# Patient Record
Sex: Male | Born: 1945 | Race: White | Hispanic: No | Marital: Married | Smoking: Former smoker
Health system: Southern US, Community
[De-identification: ages and names within clinical notes are randomized; demographics above are authoritative.]

## PROBLEM LIST (undated history)

## (undated) DIAGNOSIS — C801 Malignant (primary) neoplasm, unspecified: Secondary | ICD-10-CM

## (undated) DIAGNOSIS — D539 Nutritional anemia, unspecified: Secondary | ICD-10-CM

## (undated) DIAGNOSIS — J9611 Chronic respiratory failure with hypoxia: Secondary | ICD-10-CM

## (undated) DIAGNOSIS — G7000 Myasthenia gravis without (acute) exacerbation: Secondary | ICD-10-CM

## (undated) DIAGNOSIS — E871 Hypo-osmolality and hyponatremia: Secondary | ICD-10-CM

## (undated) DIAGNOSIS — I700 Atherosclerosis of aorta: Secondary | ICD-10-CM

## (undated) DIAGNOSIS — B451 Cerebral cryptococcosis: Secondary | ICD-10-CM

## (undated) DIAGNOSIS — E43 Unspecified severe protein-calorie malnutrition: Secondary | ICD-10-CM

## (undated) DIAGNOSIS — M810 Age-related osteoporosis without current pathological fracture: Secondary | ICD-10-CM

## (undated) DIAGNOSIS — L89151 Pressure ulcer of sacral region, stage 1: Secondary | ICD-10-CM

## (undated) DIAGNOSIS — M1990 Unspecified osteoarthritis, unspecified site: Secondary | ICD-10-CM

## (undated) HISTORY — PX: COLONOSCOPY: SHX174

---

## 2007-04-29 ENCOUNTER — Ambulatory Visit: Payer: Self-pay | Admitting: Gastroenterology

## 2007-12-10 HISTORY — DX: Rider (driver) (passenger) of other motorcycle injured in unspecified traffic accident, initial encounter: V29.99XA

## 2019-12-10 DIAGNOSIS — C3490 Malignant neoplasm of unspecified part of unspecified bronchus or lung: Secondary | ICD-10-CM

## 2019-12-10 DIAGNOSIS — I82409 Acute embolism and thrombosis of unspecified deep veins of unspecified lower extremity: Secondary | ICD-10-CM

## 2019-12-10 DIAGNOSIS — I2699 Other pulmonary embolism without acute cor pulmonale: Secondary | ICD-10-CM

## 2019-12-10 HISTORY — DX: Acute embolism and thrombosis of unspecified deep veins of unspecified lower extremity: I82.409

## 2019-12-10 HISTORY — DX: Malignant neoplasm of unspecified part of unspecified bronchus or lung: C34.90

## 2019-12-10 HISTORY — DX: Other pulmonary embolism without acute cor pulmonale: I26.99

## 2019-12-20 ENCOUNTER — Other Ambulatory Visit: Payer: Self-pay

## 2019-12-20 ENCOUNTER — Emergency Department: Payer: Medicare HMO

## 2019-12-20 ENCOUNTER — Encounter: Payer: Self-pay | Admitting: *Deleted

## 2019-12-20 ENCOUNTER — Inpatient Hospital Stay
Admission: EM | Admit: 2019-12-20 | Discharge: 2019-12-21 | DRG: 177 | Disposition: A | Discharge status: Other Acute Inpatient Hospital | Payer: Medicare HMO | Attending: Internal Medicine | Admitting: Internal Medicine

## 2019-12-20 DIAGNOSIS — Z7982 Long term (current) use of aspirin: Secondary | ICD-10-CM | POA: Diagnosis not present

## 2019-12-20 DIAGNOSIS — Z7952 Long term (current) use of systemic steroids: Secondary | ICD-10-CM | POA: Diagnosis not present

## 2019-12-20 DIAGNOSIS — Z66 Do not resuscitate: Secondary | ICD-10-CM | POA: Diagnosis present

## 2019-12-20 DIAGNOSIS — J9601 Acute respiratory failure with hypoxia: Secondary | ICD-10-CM | POA: Diagnosis present

## 2019-12-20 DIAGNOSIS — R7303 Prediabetes: Secondary | ICD-10-CM | POA: Diagnosis present

## 2019-12-20 DIAGNOSIS — J1282 Pneumonia due to coronavirus disease 2019: Secondary | ICD-10-CM | POA: Diagnosis present

## 2019-12-20 DIAGNOSIS — R0602 Shortness of breath: Secondary | ICD-10-CM | POA: Diagnosis present

## 2019-12-20 DIAGNOSIS — G7000 Myasthenia gravis without (acute) exacerbation: Secondary | ICD-10-CM | POA: Diagnosis present

## 2019-12-20 DIAGNOSIS — Z7983 Long term (current) use of bisphosphonates: Secondary | ICD-10-CM

## 2019-12-20 DIAGNOSIS — Z87891 Personal history of nicotine dependence: Secondary | ICD-10-CM

## 2019-12-20 DIAGNOSIS — Z85828 Personal history of other malignant neoplasm of skin: Secondary | ICD-10-CM

## 2019-12-20 DIAGNOSIS — R0902 Hypoxemia: Secondary | ICD-10-CM

## 2019-12-20 DIAGNOSIS — E782 Mixed hyperlipidemia: Secondary | ICD-10-CM | POA: Diagnosis present

## 2019-12-20 DIAGNOSIS — J189 Pneumonia, unspecified organism: Secondary | ICD-10-CM | POA: Diagnosis present

## 2019-12-20 DIAGNOSIS — U071 COVID-19: Principal | ICD-10-CM | POA: Diagnosis present

## 2019-12-20 HISTORY — DX: Myasthenia gravis without (acute) exacerbation: G70.00

## 2019-12-20 HISTORY — DX: Malignant (primary) neoplasm, unspecified: C80.1

## 2019-12-20 LAB — GLUCOSE, CAPILLARY
Glucose-Capillary: 147 mg/dL — ABNORMAL HIGH (ref 70–99)
Glucose-Capillary: 167 mg/dL — ABNORMAL HIGH (ref 70–99)

## 2019-12-20 LAB — BLOOD GAS, VENOUS
Acid-Base Excess: 0.7 mmol/L (ref 0.0–2.0)
Bicarbonate: 24.3 mmol/L (ref 20.0–28.0)
FIO2: 0.21
O2 Saturation: 76.6 %
Patient temperature: 37
pCO2, Ven: 35 mmHg — ABNORMAL LOW (ref 44.0–60.0)
pH, Ven: 7.45 — ABNORMAL HIGH (ref 7.250–7.430)
pO2, Ven: 39 mmHg (ref 32.0–45.0)

## 2019-12-20 LAB — CBC
HCT: 36.5 % — ABNORMAL LOW (ref 39.0–52.0)
Hemoglobin: 12.2 g/dL — ABNORMAL LOW (ref 13.0–17.0)
MCH: 32.4 pg (ref 26.0–34.0)
MCHC: 33.4 g/dL (ref 30.0–36.0)
MCV: 97.1 fL (ref 80.0–100.0)
Platelets: 270 10*3/uL (ref 150–400)
RBC: 3.76 MIL/uL — ABNORMAL LOW (ref 4.22–5.81)
RDW: 14.2 % (ref 11.5–15.5)
WBC: 7.8 10*3/uL (ref 4.0–10.5)
nRBC: 0 % (ref 0.0–0.2)

## 2019-12-20 LAB — URINALYSIS, COMPLETE (UACMP) WITH MICROSCOPIC
Bacteria, UA: NONE SEEN
Bilirubin Urine: NEGATIVE
Glucose, UA: NEGATIVE mg/dL
Hgb urine dipstick: NEGATIVE
Ketones, ur: NEGATIVE mg/dL
Leukocytes,Ua: NEGATIVE
Nitrite: NEGATIVE
Protein, ur: NEGATIVE mg/dL
Specific Gravity, Urine: 1.006 (ref 1.005–1.030)
Squamous Epithelial / HPF: NONE SEEN (ref 0–5)
pH: 6 (ref 5.0–8.0)

## 2019-12-20 LAB — COMPREHENSIVE METABOLIC PANEL
ALT: 21 U/L (ref 0–44)
AST: 35 U/L (ref 15–41)
Albumin: 3 g/dL — ABNORMAL LOW (ref 3.5–5.0)
Alkaline Phosphatase: 81 U/L (ref 38–126)
Anion gap: 10 (ref 5–15)
BUN: 21 mg/dL (ref 8–23)
CO2: 23 mmol/L (ref 22–32)
Calcium: 8.9 mg/dL (ref 8.9–10.3)
Chloride: 102 mmol/L (ref 98–111)
Creatinine, Ser: 0.97 mg/dL (ref 0.61–1.24)
GFR calc Af Amer: >60 mL/min (ref >60)
GFR calc non Af Amer: >60 mL/min (ref >60)
Glucose, Bld: 107 mg/dL — ABNORMAL HIGH (ref 70–99)
Potassium: 4.2 mmol/L (ref 3.5–5.1)
Sodium: 135 mmol/L (ref 135–145)
Total Bilirubin: 0.7 mg/dL (ref 0.3–1.2)
Total Protein: 6.4 g/dL — ABNORMAL LOW (ref 6.5–8.1)

## 2019-12-20 LAB — ABO/RH: ABO/RH(D): O POS

## 2019-12-20 LAB — CBC WITH DIFFERENTIAL/PLATELET
Abs Immature Granulocytes: 0.09 10*3/uL — ABNORMAL HIGH (ref 0.00–0.07)
Basophils Absolute: 0 10*3/uL (ref 0.0–0.1)
Basophils Relative: 0 %
Eosinophils Absolute: 0 10*3/uL (ref 0.0–0.5)
Eosinophils Relative: 0 %
HCT: 36.3 % — ABNORMAL LOW (ref 39.0–52.0)
Hemoglobin: 12.4 g/dL — ABNORMAL LOW (ref 13.0–17.0)
Immature Granulocytes: 1 %
Lymphocytes Relative: 2 %
Lymphs Abs: 0.2 10*3/uL — ABNORMAL LOW (ref 0.7–4.0)
MCH: 32.8 pg (ref 26.0–34.0)
MCHC: 34.2 g/dL (ref 30.0–36.0)
MCV: 96 fL (ref 80.0–100.0)
Monocytes Absolute: 0.6 10*3/uL (ref 0.1–1.0)
Monocytes Relative: 6 %
Neutro Abs: 8.6 10*3/uL — ABNORMAL HIGH (ref 1.7–7.7)
Neutrophils Relative %: 91 %
Platelets: 271 10*3/uL (ref 150–400)
RBC: 3.78 MIL/uL — ABNORMAL LOW (ref 4.22–5.81)
RDW: 14 % (ref 11.5–15.5)
WBC: 9.4 10*3/uL (ref 4.0–10.5)
nRBC: 0 % (ref 0.0–0.2)

## 2019-12-20 LAB — LACTIC ACID, PLASMA: Lactic Acid, Venous: 1.3 mmol/L (ref 0.5–1.9)

## 2019-12-20 LAB — PROCALCITONIN: Procalcitonin: 0.58 ng/mL

## 2019-12-20 LAB — TROPONIN I (HIGH SENSITIVITY): Troponin I (High Sensitivity): 12 ng/L (ref <18)

## 2019-12-20 MED ORDER — SODIUM CHLORIDE 0.9 % IV SOLN
100.0000 mg | Freq: Every day | INTRAVENOUS | Status: DC
  Administered 2019-12-21: 10:00:00 100 mg via INTRAVENOUS
  Filled 2019-12-20 (×2): qty 20

## 2019-12-20 MED ORDER — ENOXAPARIN SODIUM 40 MG/0.4ML ~~LOC~~ SOLN
40.0000 mg | SUBCUTANEOUS | Status: DC
  Administered 2019-12-20: 22:00:00 40 mg via SUBCUTANEOUS
  Filled 2019-12-20: qty 0.4

## 2019-12-20 MED ORDER — DEXAMETHASONE SODIUM PHOSPHATE 10 MG/ML IJ SOLN
10.0000 mg | Freq: Once | INTRAMUSCULAR | Status: AC
  Administered 2019-12-20: 13:00:00 10 mg via INTRAVENOUS
  Filled 2019-12-20: qty 1

## 2019-12-20 MED ORDER — ASCORBIC ACID 500 MG PO TABS
500.0000 mg | ORAL_TABLET | Freq: Every day | ORAL | Status: DC
  Administered 2019-12-20: 19:00:00 500 mg via ORAL
  Filled 2019-12-20 (×2): qty 1

## 2019-12-20 MED ORDER — FOLIC ACID 1 MG PO TABS
1.0000 mg | ORAL_TABLET | Freq: Every day | ORAL | Status: DC
  Administered 2019-12-20: 19:00:00 1 mg via ORAL
  Filled 2019-12-20 (×2): qty 1

## 2019-12-20 MED ORDER — CALCIUM CARBONATE-VITAMIN D 500-200 MG-UNIT PO TABS
1.0000 | ORAL_TABLET | Freq: Three times a day (TID) | ORAL | Status: DC
  Administered 2019-12-20: 1 via ORAL
  Filled 2019-12-20 (×4): qty 1

## 2019-12-20 MED ORDER — SODIUM CHLORIDE 0.9 % IV SOLN: 200.0000 mg | Freq: Once | INTRAVENOUS | Status: DC

## 2019-12-20 MED ORDER — CHELATED ZINC 50 MG PO TABS: 50.0000 mg | ORAL_TABLET | Freq: Every day | ORAL | Status: DC

## 2019-12-20 MED ORDER — MYCOPHENOLATE MOFETIL 250 MG PO CAPS
750.0000 mg | ORAL_CAPSULE | Freq: Two times a day (BID) | ORAL | Status: DC
  Administered 2019-12-21: 12:00:00 750 mg via ORAL
  Filled 2019-12-20 (×3): qty 3

## 2019-12-20 MED ORDER — SODIUM CHLORIDE 0.9% FLUSH: 3.0000 mL | INTRAVENOUS | Status: DC | PRN

## 2019-12-20 MED ORDER — SODIUM CHLORIDE 0.9 % IV SOLN: 250.0000 mL | INTRAVENOUS | Status: DC | PRN

## 2019-12-20 MED ORDER — THIAMINE HCL 100 MG PO TABS
100.0000 mg | ORAL_TABLET | Freq: Every day | ORAL | Status: DC
  Administered 2019-12-20: 19:00:00 100 mg via ORAL
  Filled 2019-12-20 (×2): qty 1

## 2019-12-20 MED ORDER — ONDANSETRON HCL 4 MG PO TABS: 4.0000 mg | ORAL_TABLET | Freq: Four times a day (QID) | ORAL | Status: DC | PRN

## 2019-12-20 MED ORDER — ADULT MULTIVITAMIN W/MINERALS CH
1.0000 | ORAL_TABLET | ORAL | Status: DC
  Administered 2019-12-20: 1 via ORAL
  Filled 2019-12-20 (×2): qty 1

## 2019-12-20 MED ORDER — B COMPLEX-C PO TABS
1.0000 | ORAL_TABLET | Freq: Every day | ORAL | Status: DC
  Filled 2019-12-20: qty 1

## 2019-12-20 MED ORDER — ONDANSETRON HCL 4 MG/2ML IJ SOLN: 4.0000 mg | Freq: Four times a day (QID) | INTRAMUSCULAR | Status: DC | PRN

## 2019-12-20 MED ORDER — DEXAMETHASONE SODIUM PHOSPHATE 10 MG/ML IJ SOLN
6.0000 mg | INTRAMUSCULAR | Status: DC
  Administered 2019-12-20: 18:00:00 6 mg via INTRAVENOUS
  Filled 2019-12-20: qty 1

## 2019-12-20 MED ORDER — SODIUM CHLORIDE 0.9 % IV SOLN
200.0000 mg | Freq: Once | INTRAVENOUS | Status: AC
  Administered 2019-12-20: 16:00:00 200 mg via INTRAVENOUS
  Filled 2019-12-20: qty 200

## 2019-12-20 MED ORDER — INSULIN ASPART 100 UNIT/ML ~~LOC~~ SOLN
0.0000 [IU] | SUBCUTANEOUS | Status: DC
  Administered 2019-12-20 – 2019-12-21 (×2): 3 [IU] via SUBCUTANEOUS
  Administered 2019-12-21: 04:00:00 2 [IU] via SUBCUTANEOUS
  Filled 2019-12-20 (×3): qty 1

## 2019-12-20 MED ORDER — ALBUTEROL SULFATE HFA 108 (90 BASE) MCG/ACT IN AERS
2.0000 | INHALATION_SPRAY | Freq: Four times a day (QID) | RESPIRATORY_TRACT | Status: DC
  Administered 2019-12-20 – 2019-12-21 (×4): 2 via RESPIRATORY_TRACT
  Filled 2019-12-20: qty 6.7

## 2019-12-20 MED ORDER — GUAIFENESIN-DM 100-10 MG/5ML PO SYRP
10.0000 mL | ORAL_SOLUTION | ORAL | Status: DC | PRN
  Filled 2019-12-20: qty 10

## 2019-12-20 MED ORDER — ASPIRIN EC 81 MG PO TBEC
81.0000 mg | DELAYED_RELEASE_TABLET | Freq: Every day | ORAL | Status: DC
  Administered 2019-12-20: 81 mg via ORAL
  Filled 2019-12-20 (×2): qty 1

## 2019-12-20 MED ORDER — ZINC SULFATE 220 (50 ZN) MG PO CAPS
220.0000 mg | ORAL_CAPSULE | Freq: Every day | ORAL | Status: DC
  Administered 2019-12-20: 19:00:00 220 mg via ORAL
  Filled 2019-12-20 (×2): qty 1

## 2019-12-20 MED ORDER — HYDROCOD POLST-CPM POLST ER 10-8 MG/5ML PO SUER
5.0000 mL | Freq: Two times a day (BID) | ORAL | Status: DC | PRN
  Administered 2019-12-21: 5 mL via ORAL
  Filled 2019-12-20 (×2): qty 5

## 2019-12-20 MED ORDER — SODIUM CHLORIDE 0.9% FLUSH
3.0000 mL | Freq: Two times a day (BID) | INTRAVENOUS | Status: DC
  Administered 2019-12-21: 03:00:00 3 mL via INTRAVENOUS

## 2019-12-20 MED ORDER — SODIUM CHLORIDE 0.9 % IV SOLN: 100.0000 mg | Freq: Every day | INTRAVENOUS | Status: DC

## 2019-12-20 NOTE — ED Provider Notes (Signed)
[facility] Emergency Department Provider Note       Time seen: ----------------------------------------- 12:05 PM on 12/20/2019 ----------------------------------------- I have reviewed the triage vital signs and the nursing notes. HISTORY   Chief Complaint Shortness of Breath   HPI Duane Santos is a 74 y.o. male with a history of myasthenia gravis, cancer who presents to the ED for Covid positivity and difficulty breathing.  Patient tested positive on January 4.  Patient's wife reports she has had persistent fever and was given ibuprofen for EMS arrived.  Patient arrived on 2 L nasal cannula oxygen was 84% to increase to 4 L resulting in 93%.  He has remained tachypneic.  No past medical history on file.  There are no problems to display for this patient.  Allergies Patient has no allergy information on record.  Social History Social History   Tobacco Use  . Smoking status: Not on file  Substance Use Topics  . Alcohol use: Not on file  . Drug use: Not on file    Review of Systems Constitutional: Positive for fever Cardiovascular: Negative for chest pain. Respiratory: Positive for shortness of breath Gastrointestinal: Negative for abdominal pain, vomiting and diarrhea. Musculoskeletal: Negative for back pain. Skin: Negative for rash. Neurological: Positive for weakness  All systems negative/normal/unremarkable except as stated in the HPI  ____________________________________________   PHYSICAL EXAM:  VITAL SIGNS: ED Triage Vitals  Enc Vitals Group     BP      Pulse      Resp      Temp      Temp src      SpO2      Weight      Height      Head Circumference      Peak Flow      Pain Score      Pain Loc      Pain Edu?      Excl. in [place]?     Constitutional: Alert and oriented.  Mild distress Eyes: Conjunctivae are normal. Normal extraocular movements. ENT      Head: Normocephalic and atraumatic.      Nose: No  congestion/rhinnorhea.      Mouth/Throat: Mucous membranes are moist.      Neck: No stridor. Cardiovascular: Normal rate, regular rhythm. No murmurs, rubs, or gallops. Respiratory: Tachypnea with clear breath sounds Gastrointestinal: Soft and nontender. Normal bowel sounds Musculoskeletal: Nontender with normal range of motion in extremities. No lower extremity tenderness nor edema. Neurologic:  Normal speech and language. No gross focal neurologic deficits are appreciated.  Skin:  Skin is warm, dry and intact. No rash noted. Psychiatric: Mood and affect are normal. Speech and behavior are normal.  ____________________________________________  EKG: Interpreted by me.  Sinus rhythm the rate of 93 bpm, normal PR interval, normal QRS, normal QT  ____________________________________________  ED COURSE:  As part of my medical decision making, I reviewed the following data within the [place] History obtained from family if available, nursing notes, old chart and ekg, as well as notes from prior ED visits. Patient presented for dyspnea and hypoxia, we will assess with labs and imaging as indicated at this time.   Procedures  ROWE WARMAN was evaluated in Emergency Department on 12/20/2019 for the symptoms described in the history of present illness. He was evaluated in the context of the global COVID-19 pandemic, which necessitated consideration that the patient might be at risk for infection with the SARS-CoV-2 virus  that causes COVID-19. Institutional protocols and algorithms that pertain to the evaluation of patients at risk for COVID-19 are in a state of rapid change based on information released by regulatory bodies including the CDC and federal and state organizations. These policies and algorithms were followed during the patient's care in the ED.  ____________________________________________   LABS (pertinent positives/negatives)  Labs Reviewed  CULTURE, BLOOD  (ROUTINE X 2)  CULTURE, BLOOD (ROUTINE X 2)  URINE CULTURE  LACTIC ACID, PLASMA  LACTIC ACID, PLASMA  COMPREHENSIVE METABOLIC PANEL  CBC WITH DIFFERENTIAL/PLATELET  BLOOD GAS, VENOUS  URINALYSIS, COMPLETE (UACMP) WITH MICROSCOPIC   CRITICAL CARE Performed by: Laurence Aly   Total critical care time: 30 minutes  Critical care time was exclusive of separately billable procedures and treating other patients.  Critical care was necessary to treat or prevent imminent or life-threatening deterioration.  Critical care was time spent personally by me on the following activities: development of treatment plan with patient and/or surrogate as well as nursing, discussions with consultants, evaluation of patient's response to treatment, examination of patient, obtaining history from patient or surrogate, ordering and performing treatments and interventions, ordering and review of laboratory studies, ordering and review of radiographic studies, pulse oximetry and re-evaluation of patient's condition.  RADIOLOGY Images were viewed by me CXR IMPRESSION: Ill-defined airspace opacity bilaterally, more on the right than on the left, without consolidation. Suspect atypical organism multifocal pneumonia.  Cardiac silhouette normal.  No adenopathy.  ____________________________________________   DIFFERENTIAL DIAGNOSIS   COVID-19, pneumonia, PE, pneumothorax  FINAL ASSESSMENT AND PLAN  COVID-19, hypoxia   Plan: The patient had presented for COVID-19 and dyspnea. Patient's labs look as expected. Patient's imaging revealed bilateral airspace disease consistent with COVID-19.  Patient was given 4 L nasal cannula oxygen as dictated above with IV Decadron.  Given his hypoxia he will require hospital admission.   Laurence Aly, MD    Note: This note was generated in part or whole with voice recognition software. Voice recognition is usually quite accurate but there are  transcription errors that can and very often do occur. I apologize for any typographical errors that were not detected and corrected.     Earleen Newport, MD 12/20/19 1255

## 2019-12-20 NOTE — ED Notes (Signed)
Report given to Wells Guiles RN/Kate B RN

## 2019-12-20 NOTE — ED Notes (Signed)
Lab has 2 sets of blood cultures from earlier today

## 2019-12-20 NOTE — Progress Notes (Signed)
Remdesivir - Pharmacy Brief Note   O:  ALT: 21 CXR: Ill-defined airspace opacity bilaterally, more on the right than on the left, without consolidation. Suspect atypical organism multifocal pneumonia SpO2: 89-93% on 4L   A/P:  Remdesivir 200 mg IVPB once followed by 100 mg IVPB daily x 4 days.   Dorena Bodo, PharmD 12/20/2019 2:57 PM

## 2019-12-20 NOTE — ED Notes (Signed)
Pillow placed under knees for comfort, encouraged slow deep breaths instead of taking shallow, small breaths. Pt states he will work on this. Alert and oriented, specimens collected per order. Pt tolerated well. Call light in reach, bed locked and low. Pt updated his daughter on his condition via hospital phone.

## 2019-12-20 NOTE — ED Notes (Signed)
Report to include Situation, Background, Assessment, and Recommendations received from [facility]. Patient alert and oriented and in no acute distress at this time.  Medications discussed and patient agreed to send home medication to pharmacy. Pt assisted to toilet for bowel movement. Patient repositioned, bed locked in lowest position, urinal available, call light within reach, lights dimmed for pt comfort.

## 2019-12-20 NOTE — ED Notes (Signed)
Pt called out to use bathroom. Pt stood next to bed to use urinal. Pt finished and O2 stat began to drop to 84. When pt was laying back down it took a few min. For o2 to get back to 95. Tech also turned oxygen up to 95%. Pt said it helps when he takes his mask off when we leave the room. Pt breathing seemed to calm itself after a few min on 95% and slow breathing.   LW edt

## 2019-12-20 NOTE — ED Notes (Signed)
Meds given to patient, explained to patient why his blood sugar is being checked with him on steroids, and why it's important to keep his blood sugar WNL with insulin. Patient now agreeable with tx. Provided with ice water and TV remote. Applied DNR bracelet

## 2019-12-20 NOTE — ED Notes (Signed)
Called respiratory regarding sats in 80s, requested high flow

## 2019-12-20 NOTE — ED Triage Notes (Signed)
Per EMS report, patient tested positive for Covid on 12/13/2019. Patient's wife reports patient has had a persistent fever of 103-104 orally and was given ibuprofen before EMS arrived. Patient arrived on 2L O2 via [place] and was 84%. O2 was increased to 4L and patient is now 93%. Patient is tachypneic at 26 and has increased dyspnea with exertion.

## 2019-12-20 NOTE — ED Notes (Signed)
Pharmacy has been messaged regarding verifying medications
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
## 2019-12-21 ENCOUNTER — Other Ambulatory Visit: Payer: Self-pay

## 2019-12-21 ENCOUNTER — Inpatient Hospital Stay (HOSPITAL_COMMUNITY)
Admission: AD | Admit: 2019-12-21 | Discharge: 2020-01-03 | DRG: 177 | Disposition: A | Discharge status: Home Health | Payer: Medicare HMO | Source: Other Acute Inpatient Hospital | Attending: Internal Medicine | Admitting: Internal Medicine

## 2019-12-21 ENCOUNTER — Encounter (HOSPITAL_COMMUNITY): Payer: Self-pay | Admitting: Internal Medicine

## 2019-12-21 DIAGNOSIS — B001 Herpesviral vesicular dermatitis: Secondary | ICD-10-CM | POA: Diagnosis present

## 2019-12-21 DIAGNOSIS — U071 COVID-19: Secondary | ICD-10-CM

## 2019-12-21 DIAGNOSIS — Z79899 Other long term (current) drug therapy: Secondary | ICD-10-CM | POA: Diagnosis not present

## 2019-12-21 DIAGNOSIS — D649 Anemia, unspecified: Secondary | ICD-10-CM | POA: Diagnosis present

## 2019-12-21 DIAGNOSIS — Z66 Do not resuscitate: Secondary | ICD-10-CM | POA: Diagnosis present

## 2019-12-21 DIAGNOSIS — J1282 Pneumonia due to coronavirus disease 2019: Secondary | ICD-10-CM | POA: Diagnosis present

## 2019-12-21 DIAGNOSIS — G7000 Myasthenia gravis without (acute) exacerbation: Secondary | ICD-10-CM | POA: Diagnosis present

## 2019-12-21 DIAGNOSIS — J9601 Acute respiratory failure with hypoxia: Secondary | ICD-10-CM | POA: Diagnosis present

## 2019-12-21 DIAGNOSIS — E782 Mixed hyperlipidemia: Secondary | ICD-10-CM | POA: Diagnosis present

## 2019-12-21 DIAGNOSIS — R040 Epistaxis: Secondary | ICD-10-CM | POA: Diagnosis not present

## 2019-12-21 DIAGNOSIS — I82442 Acute embolism and thrombosis of left tibial vein: Secondary | ICD-10-CM | POA: Diagnosis present

## 2019-12-21 DIAGNOSIS — E872 Acidosis: Secondary | ICD-10-CM | POA: Diagnosis present

## 2019-12-21 DIAGNOSIS — Z7952 Long term (current) use of systemic steroids: Secondary | ICD-10-CM

## 2019-12-21 DIAGNOSIS — R0902 Hypoxemia: Secondary | ICD-10-CM

## 2019-12-21 DIAGNOSIS — R791 Abnormal coagulation profile: Secondary | ICD-10-CM | POA: Diagnosis not present

## 2019-12-21 DIAGNOSIS — I2699 Other pulmonary embolism without acute cor pulmonale: Secondary | ICD-10-CM | POA: Diagnosis present

## 2019-12-21 DIAGNOSIS — Z7982 Long term (current) use of aspirin: Secondary | ICD-10-CM | POA: Diagnosis not present

## 2019-12-21 DIAGNOSIS — M810 Age-related osteoporosis without current pathological fracture: Secondary | ICD-10-CM | POA: Diagnosis present

## 2019-12-21 HISTORY — DX: COVID-19: U07.1

## 2019-12-21 LAB — GLUCOSE, CAPILLARY
Glucose-Capillary: 121 mg/dL — ABNORMAL HIGH (ref 70–99)
Glucose-Capillary: 145 mg/dL — ABNORMAL HIGH (ref 70–99)
Glucose-Capillary: 153 mg/dL — ABNORMAL HIGH (ref 70–99)

## 2019-12-21 LAB — URINE CULTURE: Culture: NO GROWTH

## 2019-12-21 LAB — C-REACTIVE PROTEIN: CRP: 18.5 mg/dL — ABNORMAL HIGH (ref <1.0)

## 2019-12-21 LAB — FIBRIN DERIVATIVES D-DIMER (ARMC ONLY): Fibrin derivatives D-dimer (ARMC): 3491 ng/mL (FEU) — ABNORMAL HIGH (ref 0.00–499.00)

## 2019-12-21 MED ORDER — FOLIC ACID 1 MG PO TABS: 1.0000 mg | ORAL_TABLET | Freq: Every day | ORAL | Status: DC

## 2019-12-21 MED ORDER — ASCORBIC ACID 500 MG PO TABS: 500.0000 mg | ORAL_TABLET | Freq: Every day | ORAL | Status: DC

## 2019-12-21 MED ORDER — ONDANSETRON HCL 4 MG/2ML IJ SOLN: 4.0000 mg | Freq: Four times a day (QID) | INTRAMUSCULAR | Status: DC | PRN

## 2019-12-21 MED ORDER — ADULT MULTIVITAMIN W/MINERALS CH
1.0000 | ORAL_TABLET | ORAL | Status: DC
  Administered 2019-12-24 – 2020-01-03 (×6): 1 via ORAL
  Filled 2019-12-21 (×9): qty 1

## 2019-12-21 MED ORDER — ASCORBIC ACID 500 MG PO TABS
500.0000 mg | ORAL_TABLET | Freq: Every day | ORAL | Status: DC
  Administered 2019-12-24 – 2020-01-03 (×11): 500 mg via ORAL
  Filled 2019-12-21 (×13): qty 1

## 2019-12-21 MED ORDER — ONDANSETRON HCL 4 MG PO TABS: 4.0000 mg | ORAL_TABLET | Freq: Four times a day (QID) | ORAL | Status: DC | PRN

## 2019-12-21 MED ORDER — HYDROCOD POLST-CPM POLST ER 10-8 MG/5ML PO SUER
5.0000 mL | Freq: Two times a day (BID) | ORAL | Status: DC | PRN
  Administered 2020-01-01 – 2020-01-02 (×2): 5 mL via ORAL
  Filled 2019-12-21 (×2): qty 5

## 2019-12-21 MED ORDER — CALCIUM CARBONATE-VITAMIN D 500-200 MG-UNIT PO TABS: 1.0000 | ORAL_TABLET | Freq: Three times a day (TID) | ORAL | Status: DC

## 2019-12-21 MED ORDER — SODIUM CHLORIDE 0.9 % IV SOLN
500.0000 mg | INTRAVENOUS | Status: DC
  Administered 2019-12-21 – 2019-12-23 (×3): 500 mg via INTRAVENOUS
  Filled 2019-12-21 (×3): qty 500

## 2019-12-21 MED ORDER — GUAIFENESIN-DM 100-10 MG/5ML PO SYRP: 10.0000 mL | ORAL_SOLUTION | ORAL | 0 refills | Status: DC | PRN

## 2019-12-21 MED ORDER — THIAMINE HCL 100 MG PO TABS
100.0000 mg | ORAL_TABLET | Freq: Every day | ORAL | Status: DC
  Administered 2019-12-24 – 2020-01-03 (×11): 100 mg via ORAL
  Filled 2019-12-21 (×13): qty 1

## 2019-12-21 MED ORDER — TESTOSTERONE 50 MG/5GM (1%) TD GEL
5.0000 g | TRANSDERMAL | Status: DC
  Filled 2019-12-21: qty 5

## 2019-12-21 MED ORDER — FOLIC ACID 1 MG PO TABS
1.0000 mg | ORAL_TABLET | Freq: Every day | ORAL | Status: DC
  Administered 2019-12-24 – 2020-01-03 (×11): 1 mg via ORAL
  Filled 2019-12-21 (×12): qty 1

## 2019-12-21 MED ORDER — SODIUM CHLORIDE 0.9 % IV SOLN
1.0000 g | INTRAVENOUS | Status: AC
  Administered 2019-12-21 – 2019-12-25 (×5): 1 g via INTRAVENOUS
  Filled 2019-12-21 (×6): qty 10

## 2019-12-21 MED ORDER — ONDANSETRON HCL 4 MG PO TABS: 4.0000 mg | ORAL_TABLET | Freq: Four times a day (QID) | ORAL | 0 refills | Status: DC | PRN

## 2019-12-21 MED ORDER — HYDROCOD POLST-CPM POLST ER 10-8 MG/5ML PO SUER: 5.0000 mL | Freq: Two times a day (BID) | ORAL | Status: DC | PRN

## 2019-12-21 MED ORDER — ACETAMINOPHEN 325 MG PO TABS: 650.0000 mg | ORAL_TABLET | Freq: Four times a day (QID) | ORAL | Status: DC | PRN

## 2019-12-21 MED ORDER — MELATONIN 3 MG PO TABS
3.0000 mg | ORAL_TABLET | Freq: Every evening | ORAL | Status: DC | PRN
  Administered 2019-12-25 – 2020-01-02 (×4): 3 mg via ORAL
  Filled 2019-12-21 (×6): qty 1

## 2019-12-21 MED ORDER — ACETAMINOPHEN 325 MG PO TABS
650.0000 mg | ORAL_TABLET | Freq: Four times a day (QID) | ORAL | Status: DC | PRN
  Administered 2019-12-23: 22:00:00 650 mg via ORAL
  Filled 2019-12-21: qty 2

## 2019-12-21 MED ORDER — PHENOL 1.4 % MT LIQD
1.0000 | OROMUCOSAL | Status: DC | PRN
  Administered 2019-12-21 – 2019-12-24 (×2): 1 via OROMUCOSAL
  Filled 2019-12-21: qty 177

## 2019-12-21 MED ORDER — DOCUSATE SODIUM 100 MG PO CAPS
100.0000 mg | ORAL_CAPSULE | Freq: Two times a day (BID) | ORAL | Status: DC
  Administered 2019-12-22 – 2019-12-28 (×5): 100 mg via ORAL
  Filled 2019-12-21 (×16): qty 1

## 2019-12-21 MED ORDER — MYCOPHENOLATE MOFETIL 250 MG PO CAPS: 750.0000 mg | ORAL_CAPSULE | Freq: Two times a day (BID) | ORAL | Status: DC

## 2019-12-21 MED ORDER — DEXAMETHASONE SODIUM PHOSPHATE 10 MG/ML IJ SOLN
6.0000 mg | Freq: Two times a day (BID) | INTRAMUSCULAR | Status: DC
  Administered 2019-12-21 – 2019-12-24 (×5): 6 mg via INTRAVENOUS
  Filled 2019-12-21 (×5): qty 1

## 2019-12-21 MED ORDER — ACETAMINOPHEN 325 MG PO TABS
650.0000 mg | ORAL_TABLET | Freq: Four times a day (QID) | ORAL | Status: DC | PRN
  Administered 2019-12-21: 03:00:00 650 mg via ORAL
  Filled 2019-12-21: qty 2

## 2019-12-21 MED ORDER — ENOXAPARIN SODIUM 40 MG/0.4ML ~~LOC~~ SOLN
40.0000 mg | SUBCUTANEOUS | Status: DC
  Filled 2019-12-21: qty 0.4

## 2019-12-21 MED ORDER — SODIUM CHLORIDE 0.9 % IV SOLN
100.0000 mg | Freq: Every day | INTRAVENOUS | Status: AC
  Administered 2019-12-22 – 2019-12-24 (×3): 100 mg via INTRAVENOUS
  Filled 2019-12-21 (×3): qty 20

## 2019-12-21 MED ORDER — ASPIRIN 81 MG PO TBEC
81.0000 mg | DELAYED_RELEASE_TABLET | Freq: Every day | ORAL | Status: DC
  Administered 2019-12-24 – 2019-12-31 (×8): 81 mg via ORAL
  Filled 2019-12-21 (×19): qty 1

## 2019-12-21 MED ORDER — ASPIRIN 81 MG PO TBEC: 81.0000 mg | DELAYED_RELEASE_TABLET | Freq: Every day | ORAL | Status: DC

## 2019-12-21 MED ORDER — ZINC SULFATE 220 (50 ZN) MG PO CAPS
220.0000 mg | ORAL_CAPSULE | Freq: Every day | ORAL | Status: DC
  Administered 2019-12-24 – 2020-01-03 (×11): 220 mg via ORAL
  Filled 2019-12-21 (×14): qty 1

## 2019-12-21 MED ORDER — ADULT MULTIVITAMIN W/MINERALS CH: 1.0000 | ORAL_TABLET | ORAL | Status: AC

## 2019-12-21 MED ORDER — B COMPLEX-C PO TABS: 1.0000 | ORAL_TABLET | Freq: Every day | ORAL | Status: DC

## 2019-12-21 MED ORDER — FAMOTIDINE 20 MG PO TABS
20.0000 mg | ORAL_TABLET | Freq: Every day | ORAL | Status: DC
  Administered 2019-12-24 – 2020-01-03 (×11): 20 mg via ORAL
  Filled 2019-12-21 (×13): qty 1

## 2019-12-21 MED ORDER — ALBUTEROL SULFATE HFA 108 (90 BASE) MCG/ACT IN AERS
2.0000 | INHALATION_SPRAY | Freq: Four times a day (QID) | RESPIRATORY_TRACT | Status: DC
  Administered 2019-12-21 – 2020-01-03 (×41): 2 via RESPIRATORY_TRACT
  Filled 2019-12-21: qty 6.7

## 2019-12-21 MED ORDER — SODIUM CHLORIDE 0.9% IV SOLUTION: Freq: Once | INTRAVENOUS | Status: AC

## 2019-12-21 MED ORDER — MYCOPHENOLATE MOFETIL 250 MG PO CAPS
750.0000 mg | ORAL_CAPSULE | Freq: Two times a day (BID) | ORAL | Status: DC
  Administered 2019-12-21: 11:00:00 750 mg via ORAL
  Filled 2019-12-21: qty 3

## 2019-12-21 MED ORDER — DEXAMETHASONE SODIUM PHOSPHATE 10 MG/ML IJ SOLN: 6.0000 mg | INTRAMUSCULAR | 0 refills | Status: DC

## 2019-12-21 MED ORDER — ZINC SULFATE 220 (50 ZN) MG PO CAPS: 220.0000 mg | ORAL_CAPSULE | Freq: Every day | ORAL | Status: DC

## 2019-12-21 MED ORDER — CALCIUM CARBONATE-VITAMIN D 500-200 MG-UNIT PO TABS
1.0000 | ORAL_TABLET | Freq: Three times a day (TID) | ORAL | Status: DC
  Administered 2019-12-24 – 2019-12-29 (×16): 1 via ORAL
  Filled 2019-12-21 (×25): qty 1

## 2019-12-21 MED ORDER — ENOXAPARIN SODIUM 40 MG/0.4ML ~~LOC~~ SOLN: 40.0000 mg | SUBCUTANEOUS | Status: DC

## 2019-12-21 MED ORDER — ALBUTEROL SULFATE HFA 108 (90 BASE) MCG/ACT IN AERS: 2.0000 | INHALATION_SPRAY | Freq: Four times a day (QID) | RESPIRATORY_TRACT | Status: DC

## 2019-12-21 MED ORDER — THIAMINE HCL 100 MG PO TABS: 100.0000 mg | ORAL_TABLET | Freq: Every day | ORAL | Status: DC

## 2019-12-21 MED ORDER — GUAIFENESIN-DM 100-10 MG/5ML PO SYRP: 10.0000 mL | ORAL_SOLUTION | ORAL | Status: DC | PRN

## 2019-12-21 MED ORDER — INSULIN ASPART 100 UNIT/ML ~~LOC~~ SOLN: 0.0000 [IU] | SUBCUTANEOUS | 11 refills | Status: DC

## 2019-12-21 NOTE — ED Notes (Signed)
EMTALA reviewed. 

## 2019-12-21 NOTE — ED Notes (Signed)
Mehrabyan, Neurologist [facility]

## 2019-12-21 NOTE — ED Notes (Signed)
Nonrebreather removed.  Pt on HFNC 15L only at this time.  97 SpO2.  Will continue to monitor.

## 2019-12-21 NOTE — H&P (Addendum)
THIS IS A 'NO CHARGE' NOTE  Patient transferred here from [facility].  Please review the H&P and discharge summaries dictated there and available in the EMR.  S: Patient complains of shortness of breath.  Some cough with yellowish expectoration.  Denies any chest pain.  Denies any dizziness or lightheadedness.  No nausea vomiting or diarrhea.  O: His vital signs have been reviewed.  He is noted to be on 15 L high flow nasal cannula saturating in the early 90s.  His fingers are noted to be cold.  Have requested RN to change his pulse oximetry probe to the ear.  Lungs reveal crackles bilaterally right more than left.  No wheezing or rhonchi.  Tachypneic at rest.  No use of accessory muscles S1-S2 is normal regular.  No S3-S4.  No rubs murmurs or bruit.  No lower extremity edema.  No JVD. Abdomen is soft.  Nontender nondistended.  Bowel sounds present.  No masses organomegaly He is alert and oriented x3.  No focal neurological deficits.  Labs done this morning at [facility] reviewed.  Procalcitonin was noted to be mildly elevated at 0.58.  Unfortunately no CRP has been checked.  Troponin was normal.  WBC 7.8.  D-dimer was noted to be elevated.  A/P Patient with a history of myasthenia gravis on CellCept and chronic steroids and also with a history of osteoporosis presents with pneumonia due to COVID-19.  He has acute respiratory failure with hypoxia.  Patient has been started on remdesivir and steroids.  We will increase the dose of dexamethasone.  Patient takes prednisone at home which will be held.  Patient also on CellCept at home.  CellCept can cause more immunosuppression.  We will hold it for now.  This was discussed at length with the patient and he is agreeable.  Convalescent plasma was discussed as well.  Consent forms were provided to the patient.  He requested that I speak to his wife which I did.  She will recommend him to sign the plasma consent forms.  If he does so, we will order convalescent  plasma.  Patient signed the forms.  I have cosigned it.  Plasma has been ordered.  Other experimental option including Actemra was also discussed.  However we would like to know what his CRP is before we offer it definitely.  However I did explain the rationale behind using Actemra.  I also told him this was considered experimental treatment in the case of COVID-19.  Risks and benefits including potential side effects and equivocal trial results were also explained to the patient and his wife.  They would like to hold off on it for now.  But they will be open to further discussion depending on his clinical course.  Due to mildly elevated procalcitonin and yellow sputum we will also put him on antibacterials for now.  For his myasthenia gravis we will check NIF and vital capacity.  He takes CellCept and prednisone at home.  Prednisone will be replaced with dexamethasone.  CellCept will be held for now.  He is followed by neurology at [facility].  Their notes are available in care everywhere.  Patient does not have any other medical issues apart from osteoporosis.  His Fosamax will be held.  DVT prophylaxis.  Inhalers.  Vitamins will be ordered as well.  Pepcid.  Patient is a DNR.  This was confirmed with him.  We will continue to follow him closely.  THIS IS A 'NO CHARGE' NOTE   Duane Santos  [place] 12/21/2019 6:29 PM

## 2019-12-21 NOTE — ED Notes (Addendum)
Pt's O2 sats dropped into 70s after using bathroom.  Pt was informed that he must use a bedpan in the future.  Spoke with respiratory about decompensation.  Told to place pt on nonrebreather 15L over HFNC 15L.

## 2019-12-21 NOTE — Discharge Summary (Addendum)
Duane Santos FBP:[id number] DOB: 1946-03-21 DOA: 12/20/2019  PCP: Patient, No Pcp Per  Admit date: 12/20/2019 Discharge date: 12/21/2019  Admitted From: Home Disposition: [facility]   Discharge Condition:Guarded CODE STATUS:DNR/DNI Diet recommendation:  Carb Modified  Brief/Interim Summary: Duane Santos is a 74 y.o. male with medical history significant of myasthenia gravis on prednisone, mixed hyperlipidemia, and 0 cell carcinoma reports being Covid positive on January 4 has been having persistent fever and shortness of breath.  He feels more short of breath today and his wife decided to call EMS. ED Course: He arrived per EMS on 2 L nasal cannula satting 84% and increased to 4 L resulting in 93%.  Mildly tachypneic.  T-max of 100.3, blood pressure 118/80, respiratory rate was 29 with pulse of 92.  Chest x-ray revealed ill-defined airspace opacity bilaterally without consolidation suspecting atypical organism multifocal pneumonia.  Urine cultures were obtained in the ED. Fibrin Derative  8,242.  Procalcitonin is 0.58.  Overnight patient started requiring up to 15 L high flow nasal cannula as he was desating into 70's I tried weaning him down today, but desated to low 80's and he was tachypnic. Blood culture /urine cultures are pending. Wife was concerned that patient is not on his home dose prednisone 10 mg daily due to his myasthenia gravis.  I spoke to neurology Dr. [name] and he reported Decadron is adequate glucocorticoid coverage to substitute for his typical prednisone dose at home.  In the setting of increased steroid dose there can be a transient worsening of myasthenia symptoms but this is not always seen.  Neurology recommended holding CellCept as it could contribute to his worsening Covid pneumonia.  On weaning his steroids he may need a slower titration downwards I would consider a long taper from 20 mg of prednisone toward his regular 10 mg over a couple of weeks.He  will be transferred to [facility] as he requiring higher flow of oxygen for higher level of care.   Discharge Diagnoses:  Active Problems:   PNA (pneumonia)    Discharge Instructions  Discharge Instructions    Bed rest   Complete by: As directed    Diet - low sodium heart healthy   Complete by: As directed    Increase activity slowly   Complete by: As directed      Allergies as of 12/21/2019   No Known Allergies     Medication List    STOP taking these medications   b complex vitamins tablet Replaced by: B-complex with vitamin C tablet   Calcium 600+D 600-800 MG-UNIT Tabs Generic drug: Calcium Carb-Cholecalciferol Replaced by: calcium-vitamin D 500-200 MG-UNIT tablet   Chelated Zinc 50 MG Tabs   ibuprofen 400 MG tablet Commonly known as: ADVIL   mycophenolate 250 MG capsule Commonly known as: CELLCEPT   predniSONE 5 MG tablet Commonly known as: DELTASONE     TAKE these medications   acetaminophen 325 MG tablet Commonly known as: TYLENOL Take 2 tablets (650 mg total) by mouth every 6 (six) hours as needed for fever or mild pain.   albuterol 108 (90 Base) MCG/ACT inhaler Commonly known as: VENTOLIN HFA Inhale 2 puffs into the lungs every 6 (six) hours.   alendronate 70 MG tablet Commonly known as: FOSAMAX Take 70 mg by mouth once a week.   ascorbic acid 500 MG tablet Commonly known as: VITAMIN C Take 1 tablet (500 mg total) by mouth daily. Start taking on: December 22, 2019   aspirin  81 MG EC tablet Take 1 tablet (81 mg total) by mouth daily. Start taking on: December 22, 2019   B-complex with vitamin C tablet Take 1 tablet by mouth daily. Start taking on: December 22, 2019 Replaces: b complex vitamins tablet   calcium-vitamin D 500-200 MG-UNIT tablet Commonly known as: OSCAL WITH D Take 1 tablet by mouth 3 (three) times daily. Replaces: Calcium 600+D 600-800 MG-UNIT Tabs   chlorpheniramine-HYDROcodone 10-8 MG/5ML Suer Commonly known  as: TUSSIONEX Take 5 mLs by mouth every 12 (twelve) hours as needed for cough.   dexamethasone 10 MG/ML injection Commonly known as: DECADRON Inject 0.6 mLs (6 mg total) into the vein daily.   enoxaparin 40 MG/0.4ML injection Commonly known as: LOVENOX Inject 0.4 mLs (40 mg total) into the skin daily.   folic acid 1 MG tablet Commonly known as: FOLVITE Take 1 tablet (1 mg total) by mouth daily. Start taking on: December 21, 9468   Garlic 9628 MG Caps Take 1,000 mg by mouth daily.   guaiFENesin-dextromethorphan 100-10 MG/5ML syrup Commonly known as: ROBITUSSIN DM Take 10 mLs by mouth every 4 (four) hours as needed for cough.   insulin aspart 100 UNIT/ML injection Commonly known as: novoLOG Inject 0-15 Units into the skin every 4 (four) hours.   Melatonin 3 MG Tabs Take 3 mg by mouth at bedtime as needed (sleep).   multivitamin with minerals Tabs tablet Take 1 tablet by mouth every other day. Start taking on: December 22, 2019   Omega-3 1000 MG Caps Take 1 capsule by mouth 2 (two) times daily.   ondansetron 4 MG tablet Commonly known as: ZOFRAN Take 1 tablet (4 mg total) by mouth every 6 (six) hours as needed for nausea.   testosterone 50 MG/5GM (1%) Gel Commonly known as: ANDROGEL Place 5 g onto the skin every other day.   thiamine 100 MG tablet Take 1 tablet (100 mg total) by mouth daily. Start taking on: December 22, 2019   Turmeric 500 MG Caps Take 1 capsule by mouth daily.   vitamin E 180 MG (400 UNITS) capsule Generic drug: vitamin E Take 400 Units by mouth daily.   zinc sulfate 220 (50 Zn) MG capsule Take 1 capsule (220 mg total) by mouth daily. Start taking on: December 22, 2019       No Known Allergies  Consultations: neurology  Procedures/Studies: DG Chest Port 1 View  Result Date: 12/20/2019 CLINICAL DATA:  COVID-19 positive with fever EXAM: PORTABLE CHEST 1 VIEW COMPARISON:  None. FINDINGS: There is ill-defined airspace opacity throughout  portions of each upper lobe as well as to a lesser degree in the right lower lung region. No consolidation. Heart size and pulmonary vascularity are normal. No adenopathy. There old healed rib fractures on the left with remodeling. There is mild thoracolumbar levoscoliosis. No pneumothorax. IMPRESSION: Ill-defined airspace opacity bilaterally, more on the right than on the left, without consolidation. Suspect atypical organism multifocal pneumonia. Cardiac silhouette normal.  No adenopathy. Electronically Signed   By: Lowella Grip III M.D.   On: 12/20/2019 12:33      Subjective:  Pt seen and examined. On high flow . States feeling "better' but unable to wean off as he becomes tachypnic and sob . No cp, dizziness, vision changes.  We discussed his code status again, he wants to be dnr.   Discharge Exam: Vitals:   12/21/19 0630 12/21/19 0955  BP: 129/83   Pulse: (!) 57   Resp: (!) 25   Temp:  SpO2: 98% 96%   Vitals:   12/21/19 0530 12/21/19 0600 12/21/19 0630 12/21/19 0955  BP: (!) 150/85 140/74 129/83   Pulse: 80 69 (!) 57   Resp: (!) 24 (!) 24 (!) 25   Temp:      TempSrc:      SpO2: 100% 97% 98% 96%  Weight:      Height:       Gen: on high flow oxygen. . Non tachypnic on high flow. Respiratory: scattered mild coarse breath sounds o no wheezing or rales  Cardiovascular: Regular rate and rhythm, no murmurs / rubs / gallops. No extremity edema. Abdomen: no tenderness, no masses palpated.  . Bowel sounds positive.  Musculoskeletal: no clubbing / cyanosis.  Skin: Warm dry Neurologic: CN 2-12 grossly intact.  Alert oriented x3  psychiatric: Normal judgment and insight. Normal mood.     The results of significant diagnostics from this hospitalization (including imaging, microbiology, ancillary and laboratory) are listed below for reference.     Microbiology: Recent Results (from the past 240 hour(s))  Blood Culture (routine x 2)     Status: None (Preliminary result)    Collection Time: 12/20/19 12:59 PM   Specimen: BLOOD  Result Value Ref Range Status   Specimen Description BLOOD RIGHT AC  Final   Special Requests   Final    BOTTLES DRAWN AEROBIC AND ANAEROBIC Blood Culture results may not be optimal due to an excessive volume of blood received in culture bottles   Culture   Final    NO GROWTH < 24 HOURS Performed at [facility], [address] [place], [place] [address]    Report Status PENDING  Incomplete  Blood Culture (routine x 2)     Status: None (Preliminary result)   Collection Time: 12/20/19  1:00 PM   Specimen: BLOOD  Result Value Ref Range Status   Specimen Description BLOOD BLOOD RIGHT HAND  Final   Special Requests   Final    BOTTLES DRAWN AEROBIC AND ANAEROBIC Blood Culture adequate volume   Culture   Final    NO GROWTH < 24 HOURS Performed at [facility], [address] [place], [place] [address]    Report Status PENDING  Incomplete  Urine culture     Status: None   Collection Time: 12/20/19  1:00 PM   Specimen: In/Out Cath Urine  Result Value Ref Range Status   Specimen Description   Final    IN/OUT CATH URINE Performed at [facility], [address] [place], [place] [address]    Special Requests   Final    NONE Performed at [facility], [address] [place], [place] [address]    Culture   Final    NO GROWTH Performed at [facility], [place] [address], [place], [place] [address]    Report Status 12/21/2019 FINAL  Final     Labs: BNP (last 3 results) No results for input(s): BNP in the last 8760 hours. Basic Metabolic Panel: Recent Labs  Lab 12/20/19 1259  NA 135  K 4.2  CL 102  CO2 23  GLUCOSE 107*  BUN 21  CREATININE 0.97  CALCIUM 8.9   Liver Function Tests: Recent Labs  Lab 12/20/19 1259  AST 35  ALT 21  ALKPHOS 81  BILITOT 0.7  PROT 6.4*  ALBUMIN 3.0*   No results for input(s): LIPASE, AMYLASE in the last 168 hours. No results for  input(s): AMMONIA in the last 168  hours. CBC: Recent Labs  Lab 12/20/19 1259 12/20/19 2204  WBC 9.4 7.8  NEUTROABS 8.6*  --   HGB 12.4* 12.2*  HCT 36.3* 36.5*  MCV 96.0 97.1  PLT 271 270   Cardiac Enzymes: No results for input(s): CKTOTAL, CKMB, CKMBINDEX, TROPONINI in the last 168 hours. BNP: Invalid input(s): POCBNP CBG: Recent Labs  Lab 12/20/19 1820 12/20/19 2147 12/21/19 0033 12/21/19 0400 12/21/19 1150  GLUCAP 147* 167* 153* 121* 145*   D-Dimer No results for input(s): DDIMER in the last 72 hours. Hgb A1c No results for input(s): HGBA1C in the last 72 hours. Lipid Profile No results for input(s): CHOL, HDL, LDLCALC, TRIG, CHOLHDL, LDLDIRECT in the last 72 hours. Thyroid function studies No results for input(s): TSH, T4TOTAL, T3FREE, THYROIDAB in the last 72 hours.  Invalid input(s): FREET3 Anemia work up No results for input(s): VITAMINB12, FOLATE, FERRITIN, TIBC, IRON, RETICCTPCT in the last 72 hours. Urinalysis    Component Value Date/Time   COLORURINE YELLOW (A) 12/20/2019 1300   APPEARANCEUR CLEAR (A) 12/20/2019 1300   LABSPEC 1.006 12/20/2019 1300   PHURINE 6.0 12/20/2019 1300   GLUCOSEU NEGATIVE 12/20/2019 1300   HGBUR NEGATIVE 12/20/2019 1300   BILIRUBINUR NEGATIVE 12/20/2019 1300   KETONESUR NEGATIVE 12/20/2019 1300   PROTEINUR NEGATIVE 12/20/2019 1300   NITRITE NEGATIVE 12/20/2019 1300   LEUKOCYTESUR NEGATIVE 12/20/2019 1300   Sepsis Labs Invalid input(s): PROCALCITONIN,  WBC,  LACTICIDVEN Microbiology Recent Results (from the past 240 hour(s))  Blood Culture (routine x 2)     Status: None (Preliminary result)   Collection Time: 12/20/19 12:59 PM   Specimen: BLOOD  Result Value Ref Range Status   Specimen Description BLOOD RIGHT AC  Final   Special Requests   Final    BOTTLES DRAWN AEROBIC AND ANAEROBIC Blood Culture results may not be optimal due to an excessive volume of blood received in culture bottles   Culture   Final    NO GROWTH  < 24 HOURS Performed at [facility], [address] [place], [place] [address]    Report Status PENDING  Incomplete  Blood Culture (routine x 2)     Status: None (Preliminary result)   Collection Time: 12/20/19  1:00 PM   Specimen: BLOOD  Result Value Ref Range Status   Specimen Description BLOOD BLOOD RIGHT HAND  Final   Special Requests   Final    BOTTLES DRAWN AEROBIC AND ANAEROBIC Blood Culture adequate volume   Culture   Final    NO GROWTH < 24 HOURS Performed at [facility], [address] [place], [place] [address]    Report Status PENDING  Incomplete  Urine culture     Status: None   Collection Time: 12/20/19  1:00 PM   Specimen: In/Out Cath Urine  Result Value Ref Range Status   Specimen Description   Final    IN/OUT CATH URINE Performed at [facility], [address], [place], [place] [address]    Special Requests   Final    NONE Performed at [facility], [address] [place], [place] [address]    Culture   Final    NO GROWTH Performed at [facility], [place] [address] [place], [place] [address]    Report Status 12/21/2019 FINAL  Final     Time coordinating discharge: Over 30 minutes  SIGNED:   Nolberto Hanlon, MD  [facility] 12/21/2019, 1:13 PM Pager   If 7PM-7AM, please contact night-coverage [email or web address]  Password [email or web address]

## 2019-12-21 NOTE — Progress Notes (Addendum)
Brief communication:  Patient with myasthenia gravis with acute hypoxic respiratory failure in the setting of COVID pneumonia without any signs of myasthenic flare per IM.  Myasthenia does not typically cause a hypoxic respiratory failure, but certainly if the patient does continue to have an increased respiratory rate, fatigue would be higher concern than it would be in a typical patient.  Certainly if there is any concern for hypercarbia or hypercarbic failure, I would have a lower threshold for supporting the patient with intubation.  From medication standpoint, the Decadron dose typically used is 6 mg daily and this should have adequate glucocorticoid coverage to substitute for his typical prednisone 10 mg evry other day.  In the setting of increasing steroid dose, there can be a transient worsening of myasthenic symptoms, but this is not always seen.  If there is concern that CellCept could be contributing to his worsening Covid pneumonia, this could be held transiently as it is typically considered a longer term medication, but I would not stop the steroids unless absolutely necessary.  On weaning his steroids, he may need a slower titration downwards, I would consider a long taper from 20 mg of prednisone towards his regular 10 mg over a couple of weeks.  Also, would check serial NIFs an VC.   Neurology is available as needed.  Roland Rack, MD [facility] [phone]  If 7pm- 7am, please page neurology on call as listed in [place].

## 2019-12-21 NOTE — ED Notes (Addendum)
Admitting MD messaged via secure chat about prednisone vs decadron per pt wife's concern.

## 2019-12-21 NOTE — ED Notes (Signed)
Report called to Sonia Side at [facility]
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
## 2019-12-21 NOTE — ED Notes (Signed)
Pt given water and meal tray. Pt also given tooth paste and tooth brush per request.

## 2019-12-21 NOTE — ED Notes (Signed)
Patient repositioned on L side. Tolerated well

## 2019-12-21 NOTE — ED Notes (Signed)
E-signature not working at this time. Pt verbalized consent to be transferred to [facility] at this time. Pt verbalizes understanding of the risks and benefits of transfer. No further questions at this time. Pt stable at time of D/C.

## 2019-12-22 ENCOUNTER — Inpatient Hospital Stay (HOSPITAL_COMMUNITY): Payer: Medicare HMO

## 2019-12-22 ENCOUNTER — Encounter (HOSPITAL_COMMUNITY): Payer: Medicare HMO

## 2019-12-22 LAB — CBC WITH DIFFERENTIAL/PLATELET
Abs Immature Granulocytes: 0.14 10*3/uL — ABNORMAL HIGH (ref 0.00–0.07)
Basophils Absolute: 0 10*3/uL (ref 0.0–0.1)
Basophils Relative: 0 %
Eosinophils Absolute: 0 10*3/uL (ref 0.0–0.5)
Eosinophils Relative: 0 %
HCT: 34.1 % — ABNORMAL LOW (ref 39.0–52.0)
Hemoglobin: 11.6 g/dL — ABNORMAL LOW (ref 13.0–17.0)
Immature Granulocytes: 1 %
Lymphocytes Relative: 2 %
Lymphs Abs: 0.2 10*3/uL — ABNORMAL LOW (ref 0.7–4.0)
MCH: 33.1 pg (ref 26.0–34.0)
MCHC: 34 g/dL (ref 30.0–36.0)
MCV: 97.4 fL (ref 80.0–100.0)
Monocytes Absolute: 0.6 10*3/uL (ref 0.1–1.0)
Monocytes Relative: 6 %
Neutro Abs: 9.5 10*3/uL — ABNORMAL HIGH (ref 1.7–7.7)
Neutrophils Relative %: 91 %
Platelets: 226 10*3/uL (ref 150–400)
RBC: 3.5 MIL/uL — ABNORMAL LOW (ref 4.22–5.81)
RDW: 14.4 % (ref 11.5–15.5)
WBC: 10.5 10*3/uL (ref 4.0–10.5)
nRBC: 0 % (ref 0.0–0.2)

## 2019-12-22 LAB — COMPREHENSIVE METABOLIC PANEL
ALT: 30 U/L (ref 0–44)
AST: 42 U/L — ABNORMAL HIGH (ref 15–41)
Albumin: 2.8 g/dL — ABNORMAL LOW (ref 3.5–5.0)
Alkaline Phosphatase: 76 U/L (ref 38–126)
Anion gap: 9 (ref 5–15)
BUN: 31 mg/dL — ABNORMAL HIGH (ref 8–23)
CO2: 23 mmol/L (ref 22–32)
Calcium: 8.6 mg/dL — ABNORMAL LOW (ref 8.9–10.3)
Chloride: 102 mmol/L (ref 98–111)
Creatinine, Ser: 0.87 mg/dL (ref 0.61–1.24)
GFR calc Af Amer: >60 mL/min (ref >60)
GFR calc non Af Amer: >60 mL/min (ref >60)
Glucose, Bld: 128 mg/dL — ABNORMAL HIGH (ref 70–99)
Potassium: 4.9 mmol/L (ref 3.5–5.1)
Sodium: 134 mmol/L — ABNORMAL LOW (ref 135–145)
Total Bilirubin: 0.7 mg/dL (ref 0.3–1.2)
Total Protein: 6 g/dL — ABNORMAL LOW (ref 6.5–8.1)

## 2019-12-22 LAB — D-DIMER, QUANTITATIVE: D-Dimer, Quant: >20 ug/mL-FEU — ABNORMAL HIGH (ref 0.00–0.50)

## 2019-12-22 LAB — ABO/RH: ABO/RH(D): O POS

## 2019-12-22 LAB — FERRITIN: Ferritin: 645 ng/mL — ABNORMAL HIGH (ref 24–336)

## 2019-12-22 LAB — MAGNESIUM: Magnesium: 2.3 mg/dL (ref 1.7–2.4)

## 2019-12-22 LAB — PROCALCITONIN: Procalcitonin: 0.35 ng/mL

## 2019-12-22 LAB — C-REACTIVE PROTEIN: CRP: 15.8 mg/dL — ABNORMAL HIGH (ref <1.0)

## 2019-12-22 MED ORDER — ENOXAPARIN SODIUM 40 MG/0.4ML ~~LOC~~ SOLN
40.0000 mg | Freq: Two times a day (BID) | SUBCUTANEOUS | Status: DC
  Administered 2019-12-22 (×2): 40 mg via SUBCUTANEOUS
  Filled 2019-12-22 (×2): qty 0.4

## 2019-12-22 MED ORDER — TESTOSTERONE 50 MG/5GM (1%) TD GEL: 5.0000 g | TRANSDERMAL | Status: DC

## 2019-12-22 MED ORDER — TOCILIZUMAB 400 MG/20ML IV SOLN
8.0000 mg/kg | Freq: Once | INTRAVENOUS | Status: AC
  Administered 2019-12-22: 14:00:00 456 mg via INTRAVENOUS
  Filled 2019-12-22: qty 10

## 2019-12-22 NOTE — Progress Notes (Signed)
PROGRESS NOTE                                                                                                                                                                                                             Patient Demographics:    Duane Santos, is a 74 y.o. male, DOB - 06/10/1946, VZC:[id number]  Admit date - 12/21/2019   Admitting Physician Bonnielee Haff, MD  Outpatient Primary MD for the patient is [facility]  LOS - 1   No chief complaint on file.      Brief Narrative    74 y.o. male with medical history significant of myasthenia gravis on prednisone, mixed hyperlipidemia,reports being Covid positive on January 4 has been having persistent fever and shortness of breath.  He feels more short of breath today and his wife decided to call EMS.  Patient was hypoxic in ED, he was transferred to [place] 1/12 for further management.   Subjective:    [name] today ports dyspnea with exertion, complains of cough, denies any fever, or chest pain .    Assessment  & Plan :    Principal Problem:   Pneumonia due to COVID-19 virus Active Problems:   Acute respiratory failure with hypoxia (HCC)   Myasthenia gravis (HCC)   Normocytic anemia   Osteoporosis  Acute hypoxic respiratory failure due to COVID-19 pneumonia -Remains with significant oxygen requirement, he is on 15 L high flow nasal cannula. -Was encouraged use incentive spirometry, flutter valve, to prone. -Continue with IV steroids.  On increased dose of dexamethasone. -Continue with IV remdesivir. -Received convalescent plasma 1/12 -Discussed Actemra and wife, patient with no history of TB, hepatitis viral infection, diverticulitis, or perforation, did explain the rationale behind using Actemra.  I also told him this was considered experimental treatment in the case of COVID-19.  Risks and benefits including potential side effects and equivocal trial results were also  explained to the patient and his wife, they are agreeable to proceed with Actemra, will give Actemra today. -Continue to trend inflammatory markers closely. -continue with IV Rocephin and azithromycin, to finish total of 5 days.   COVID-19 Labs  Recent Labs    12/21/19 2125 12/22/19 0520  DDIMER  --  >20.00*  FERRITIN  --  645*  CRP 18.5* 15.8*    No results found  for: SARSCOV2NAA  Elevated D-dimers -Most likely in the setting of COVID-19, will obtain venous Dopplers, meanwhile we will continue with [place] 40 mg subcu twice daily.  myasthenia gravis  -P.o. prednisone and CellCept at home, following with [facility] neurology -Oral prednisone on hold as patient on IV hydrocortisone,  -Continue  to hold CellCept for now.    Code Status : Full  Family Communication  : D/W via phone  Disposition Plan  : Home  Consults  :  none  DVT Prophylaxis  : [place] lovenox  Lab Results  Component Value Date   PLT 226 12/22/2019    Antibiotics  :    Anti-infectives (From admission, onward)   Start     Dose/Rate Route Frequency Ordered Stop   12/22/19 1000  remdesivir 100 mg in sodium chloride 0.9 % 100 mL IVPB     100 mg 200 mL/hr over 30 Minutes Intravenous Daily 12/21/19 1758 12/25/19 0959   12/21/19 1915  cefTRIAXone (ROCEPHIN) 1 g in sodium chloride 0.9 % 100 mL IVPB     1 g 200 mL/hr over 30 Minutes Intravenous Every 24 hours 12/21/19 1819 12/26/19 1914   12/21/19 1845  azithromycin (ZITHROMAX) 500 mg in sodium chloride 0.9 % 250 mL IVPB     500 mg 250 mL/hr over 60 Minutes Intravenous Every 24 hours 12/21/19 1819 12/26/19 1844        Objective:   Vitals:   12/22/19 0528 12/22/19 0621 12/22/19 0721 12/22/19 1204  BP: (!) 142/66 137/71 140/79 (!) 178/87  Pulse: 86 84 86 85  Resp: (!) 29 (!) 38 (!) 24 16  Temp: 98.6 F (37 C) 98.4 F (36.9 C) 98.1 F (36.7 C) 98.1 F (36.7 C)  TempSrc: Oral Oral Oral Oral  SpO2: 90% 95% 95% 95%  Weight:      Height:         Wt Readings from Last 3 Encounters:  12/21/19 57.1 kg  12/20/19 56.7 kg     Intake/Output Summary (Last 24 hours) at 12/22/2019 1304 Last data filed at 12/22/2019 1205 Gross per 24 hour  Intake 997.08 ml  Output 1150 ml  Net -152.92 ml     Physical Exam  Awake Alert, Oriented X 3, No new F.N deficits, Normal affect Symmetrical Chest wall movement, Good air movement bilaterally, CTAB RRR,No Gallops,Rubs or new Murmurs, No Parasternal Heave +ve B.Sounds, Abd Soft, No tenderness, No rebound - guarding or rigidity. No Cyanosis, Clubbing or edema, No new Rash or bruise      Data Review:    CBC Recent Labs  Lab 12/20/19 1259 12/20/19 2204 12/22/19 0520  WBC 9.4 7.8 10.5  HGB 12.4* 12.2* 11.6*  HCT 36.3* 36.5* 34.1*  PLT 271 270 226  MCV 96.0 97.1 97.4  MCH 32.8 32.4 33.1  MCHC 34.2 33.4 34.0  RDW 14.0 14.2 14.4  LYMPHSABS 0.2*  --  0.2*  MONOABS 0.6  --  0.6  EOSABS 0.0  --  0.0  BASOSABS 0.0  --  0.0    Chemistries  Recent Labs  Lab 12/20/19 1259 12/22/19 0520  NA 135 134*  K 4.2 4.9  CL 102 102  CO2 23 23  GLUCOSE 107* 128*  BUN 21 31*  CREATININE 0.97 0.87  CALCIUM 8.9 8.6*  MG  --  2.3  AST 35 42*  ALT 21 30  ALKPHOS 81 76  BILITOT 0.7 0.7   ------------------------------------------------------------------------------------------------------------------ No results for input(s): CHOL, HDL, LDLCALC, TRIG, CHOLHDL, LDLDIRECT  in the last 72 hours.  No results found for: HGBA1C ------------------------------------------------------------------------------------------------------------------ No results for input(s): TSH, T4TOTAL, T3FREE, THYROIDAB in the last 72 hours.  Invalid input(s): FREET3 ------------------------------------------------------------------------------------------------------------------ Recent Labs    12/22/19 0520  FERRITIN 645*    Coagulation profile No results for input(s): INR, PROTIME in the last 168  hours.  Recent Labs    12/22/19 0520  DDIMER >20.00*    Cardiac Enzymes No results for input(s): CKMB, TROPONINI, MYOGLOBIN in the last 168 hours.  Invalid input(s): CK ------------------------------------------------------------------------------------------------------------------ No results found for: BNP  Inpatient Medications  Scheduled Meds: . albuterol  2 puff Inhalation Q6H  . vitamin C  500 mg Oral Daily  . aspirin  81 mg Oral Daily  . calcium-vitamin D  1 tablet Oral TID WC  . dexamethasone (DECADRON) injection  6 mg Intravenous Q12H  . docusate sodium  100 mg Oral BID  . enoxaparin (LOVENOX) injection  40 mg Subcutaneous Q12H  . famotidine  20 mg Oral Daily  . folic acid  1 mg Oral Daily  . multivitamin with minerals  1 tablet Oral QODAY  . testosterone  5 g Transdermal QODAY  . thiamine  100 mg Oral Daily  . zinc sulfate  220 mg Oral Daily   Continuous Infusions: . azithromycin 500 mg (12/21/19 1936)  . cefTRIAXone (ROCEPHIN)  IV 1 g (12/21/19 2122)  . remdesivir 100 mg in NS 100 mL 100 mg (12/22/19 1038)   PRN Meds:.acetaminophen, chlorpheniramine-HYDROcodone, guaiFENesin-dextromethorphan, Melatonin, ondansetron **OR** ondansetron (ZOFRAN) IV, phenol  Micro Results Recent Results (from the past 240 hour(s))  Blood Culture (routine x 2)     Status: None (Preliminary result)   Collection Time: 12/20/19 12:59 PM   Specimen: BLOOD  Result Value Ref Range Status   Specimen Description BLOOD RIGHT AC  Final   Special Requests   Final    BOTTLES DRAWN AEROBIC AND ANAEROBIC Blood Culture results may not be optimal due to an excessive volume of blood received in culture bottles   Culture   Final    NO GROWTH 2 DAYS Performed at [facility], [address] [place], [place] [address]    Report Status PENDING  Incomplete  Blood Culture (routine x 2)     Status: None (Preliminary result)   Collection Time: 12/20/19  1:00 PM   Specimen: BLOOD  Result  Value Ref Range Status   Specimen Description BLOOD BLOOD RIGHT HAND  Final   Special Requests   Final    BOTTLES DRAWN AEROBIC AND ANAEROBIC Blood Culture adequate volume   Culture   Final    NO GROWTH 2 DAYS Performed at [facility], [address] [place], [place] [address]    Report Status PENDING  Incomplete  Urine culture     Status: None   Collection Time: 12/20/19  1:00 PM   Specimen: In/Out Cath Urine  Result Value Ref Range Status   Specimen Description   Final    IN/OUT CATH URINE Performed at [facility], [address], [place], [place] [address]    Special Requests   Final    NONE Performed at [facility], [address] [place], [place] [address]    Culture   Final    NO GROWTH Performed at [facility], [place] [address] [place], [place] [address]    Report Status 12/21/2019 FINAL  Final    Radiology Reports PCXR COVID AM  Result Date: 12/22/2019 CLINICAL DATA:  COVID-19 positive. EXAM: PORTABLE CHEST 1 VIEW COMPARISON:  December 20, 2019. FINDINGS: The heart size and mediastinal contours are within normal limits. No pneumothorax pleural effusion is noted. Old left rib fractures are noted. Mild interstitial densities are noted throughout both lungs suggesting atypical infection. IMPRESSION: Mild interstitial densities are noted throughout both lungs suggesting atypical infection. Followup radiographs are recommended until resolution. No pneumothorax is noted. Electronically Signed   By: Marijo Conception M.D.   On: 12/22/2019 09:11   DG Chest Port 1 View  Result Date: 12/20/2019 CLINICAL DATA:  COVID-19 positive with fever EXAM: PORTABLE CHEST 1 VIEW COMPARISON:  None. FINDINGS: There is ill-defined airspace opacity throughout portions of each upper lobe as well as to a lesser degree in the right lower lung region. No consolidation. Heart size and pulmonary vascularity are normal. No adenopathy. There old healed rib fractures on  the left with remodeling. There is mild thoracolumbar levoscoliosis. No pneumothorax. IMPRESSION: Ill-defined airspace opacity bilaterally, more on the right than on the left, without consolidation. Suspect atypical organism multifocal pneumonia. Cardiac silhouette normal.  No adenopathy. Electronically Signed   By: Lowella Grip III M.D.   On: 12/20/2019 12:33     Phillips Climes M.D on 12/22/2019 at 1:04 PM  Between 7am to 7pm - Pager - [phone]  After 7pm go to [email or web address] - password [facility]  [facility] -  Office  [phone]

## 2019-12-22 NOTE — Progress Notes (Signed)
NIF greater than -40 with great pt effort

## 2019-12-22 NOTE — Plan of Care (Signed)
Patient Remains Aox4, teetered between 10-15L HF, when patient stood to side of bed to use urinal sats tend to lower and work of breathing increases, Quickly recover with rest, received C. Plasma, Refused OTC meds at night along with Lovenox, Continue Plan of care Problem: Education: Goal: Knowledge of risk factors and measures for prevention of condition will improve Outcome: Progressing   Problem: Coping: Goal: Psychosocial and spiritual needs will be supported Outcome: Progressing   Problem: Respiratory: Goal: Will maintain a patent airway Outcome: Progressing Goal: Complications related to the disease process, condition or treatment will be avoided or minimized Outcome: Progressing   Problem: Education: Goal: Knowledge of General Education information will improve Description: Including pain rating scale, medication(s)/side effects and non-pharmacologic comfort measures Outcome: Progressing   Problem: Health Behavior/Discharge Planning: Goal: Ability to manage health-related needs will improve Outcome: Progressing   Problem: Clinical Measurements: Goal: Ability to maintain clinical measurements within normal limits will improve Outcome: Progressing Goal: Will remain free from infection Outcome: Progressing Goal: Diagnostic test results will improve Outcome: Progressing Goal: Respiratory complications will improve Outcome: Progressing Goal: Cardiovascular complication will be avoided Outcome: Progressing   Problem: Activity: Goal: Risk for activity intolerance will decrease Outcome: Progressing   Problem: Nutrition: Goal: Adequate nutrition will be maintained Outcome: Progressing   Problem: Pain Managment: Goal: General experience of comfort will improve Outcome: Progressing   Problem: Safety: Goal: Ability to remain free from injury will improve Outcome: Progressing   Problem: Skin Integrity: Goal: Risk for impaired skin integrity will decrease Outcome:  Progressing

## 2019-12-22 NOTE — Progress Notes (Signed)
Patient refusing vitamins, aspirin, and testosterone gel, MD was made aware via secure chat. Patient got up to [facility] and then to chair. Washed up with minimal assistance. He desats with activity but is able to recover on 15 L HFNC.  Offered to call wife with an update, however the patient said she has two phones that have been ringing non-stop and not to call her at this time.

## 2019-12-22 NOTE — Progress Notes (Signed)
NIF >-40.  Patient with good effort.

## 2019-12-23 ENCOUNTER — Inpatient Hospital Stay (HOSPITAL_COMMUNITY): Payer: Medicare HMO

## 2019-12-23 DIAGNOSIS — R791 Abnormal coagulation profile: Secondary | ICD-10-CM

## 2019-12-23 DIAGNOSIS — U071 COVID-19: Secondary | ICD-10-CM

## 2019-12-23 LAB — COMPREHENSIVE METABOLIC PANEL
ALT: 35 U/L (ref 0–44)
AST: 44 U/L — ABNORMAL HIGH (ref 15–41)
Albumin: 2.7 g/dL — ABNORMAL LOW (ref 3.5–5.0)
Alkaline Phosphatase: 83 U/L (ref 38–126)
Anion gap: 6 (ref 5–15)
BUN: 29 mg/dL — ABNORMAL HIGH (ref 8–23)
CO2: 24 mmol/L (ref 22–32)
Calcium: 8.8 mg/dL — ABNORMAL LOW (ref 8.9–10.3)
Chloride: 105 mmol/L (ref 98–111)
Creatinine, Ser: 0.67 mg/dL (ref 0.61–1.24)
GFR calc Af Amer: >60 mL/min (ref >60)
GFR calc non Af Amer: >60 mL/min (ref >60)
Glucose, Bld: 129 mg/dL — ABNORMAL HIGH (ref 70–99)
Potassium: 4.7 mmol/L (ref 3.5–5.1)
Sodium: 135 mmol/L (ref 135–145)
Total Bilirubin: 0.8 mg/dL (ref 0.3–1.2)
Total Protein: 5.7 g/dL — ABNORMAL LOW (ref 6.5–8.1)

## 2019-12-23 LAB — BPAM FFP
Blood Product Expiration Date: 202101140050
ISSUE DATE / TIME: 202101130129
Unit Type and Rh: 5100

## 2019-12-23 LAB — D-DIMER, QUANTITATIVE: D-Dimer, Quant: >20 ug/mL-FEU — ABNORMAL HIGH (ref 0.00–0.50)

## 2019-12-23 LAB — CBC WITH DIFFERENTIAL/PLATELET
Abs Immature Granulocytes: 0.57 10*3/uL — ABNORMAL HIGH (ref 0.00–0.07)
Basophils Absolute: 0.1 10*3/uL (ref 0.0–0.1)
Basophils Relative: 1 %
Eosinophils Absolute: 0 10*3/uL (ref 0.0–0.5)
Eosinophils Relative: 0 %
HCT: 33.8 % — ABNORMAL LOW (ref 39.0–52.0)
Hemoglobin: 11.5 g/dL — ABNORMAL LOW (ref 13.0–17.0)
Immature Granulocytes: 5 %
Lymphocytes Relative: 2 %
Lymphs Abs: 0.2 10*3/uL — ABNORMAL LOW (ref 0.7–4.0)
MCH: 32.7 pg (ref 26.0–34.0)
MCHC: 34 g/dL (ref 30.0–36.0)
MCV: 96 fL (ref 80.0–100.0)
Monocytes Absolute: 0.4 10*3/uL (ref 0.1–1.0)
Monocytes Relative: 3 %
Neutro Abs: 10 10*3/uL — ABNORMAL HIGH (ref 1.7–7.7)
Neutrophils Relative %: 89 %
Platelets: 192 10*3/uL (ref 150–400)
RBC: 3.52 MIL/uL — ABNORMAL LOW (ref 4.22–5.81)
RDW: 14.3 % (ref 11.5–15.5)
WBC: 11.2 10*3/uL — ABNORMAL HIGH (ref 4.0–10.5)
nRBC: 0 % (ref 0.0–0.2)

## 2019-12-23 LAB — MAGNESIUM: Magnesium: 2.2 mg/dL (ref 1.7–2.4)

## 2019-12-23 LAB — PREPARE FRESH FROZEN PLASMA: Unit division: 0

## 2019-12-23 LAB — C-REACTIVE PROTEIN: CRP: 11 mg/dL — ABNORMAL HIGH (ref <1.0)

## 2019-12-23 LAB — FERRITIN: Ferritin: 838 ng/mL — ABNORMAL HIGH (ref 24–336)

## 2019-12-23 MED ORDER — DEXAMETHASONE 6 MG PO TABS
6.0000 mg | ORAL_TABLET | Freq: Once | ORAL | Status: AC
  Administered 2019-12-23: 21:00:00 6 mg via ORAL
  Filled 2019-12-23: qty 1

## 2019-12-23 MED ORDER — AZITHROMYCIN 250 MG PO TABS
500.0000 mg | ORAL_TABLET | Freq: Once | ORAL | Status: AC
  Administered 2019-12-23: 21:00:00 500 mg via ORAL
  Filled 2019-12-23: qty 2

## 2019-12-23 MED ORDER — ENOXAPARIN SODIUM 60 MG/0.6ML ~~LOC~~ SOLN
1.0000 mg/kg | Freq: Two times a day (BID) | SUBCUTANEOUS | Status: DC
  Administered 2019-12-23 – 2019-12-26 (×7): 55 mg via SUBCUTANEOUS
  Filled 2019-12-23 (×7): qty 0.6

## 2019-12-23 MED ORDER — IOHEXOL 350 MG/ML SOLN
75.0000 mL | Freq: Once | INTRAVENOUS | Status: AC | PRN
  Administered 2019-12-23: 09:00:00 75 mL via INTRAVENOUS

## 2019-12-23 MED ORDER — AZITHROMYCIN 250 MG PO TABS: 500.0000 mg | ORAL_TABLET | Freq: Once | ORAL | Status: DC

## 2019-12-23 MED ORDER — HYALURONIDASE HUMAN 150 UNIT/ML IJ SOLN
50.0000 [IU] | Freq: Once | INTRAMUSCULAR | Status: AC
  Administered 2019-12-23: 22:00:00 49.5 [IU] via SUBCUTANEOUS
  Filled 2019-12-23: qty 0.33

## 2019-12-23 MED ORDER — HYALURONIDASE OVINE 200 UNIT/ML IJ SOLN
50.0000 [IU] | Freq: Once | INTRAMUSCULAR | Status: DC
  Filled 2019-12-23: qty 0.25

## 2019-12-23 NOTE — Progress Notes (Signed)
Bilateral lower extremity venous duplex completed.  Critical results discussed with Dr. Waldron Labs and Orson Ape, RN.  12/23/2019 12:04 PM Kelby Aline., MHA, RVT, RDCS, RDMS

## 2019-12-23 NOTE — Progress Notes (Signed)
PROGRESS NOTE                                                                                                                                                                                                             Patient Demographics:    Duane Santos, is a 74 y.o. male, DOB - 1946/08/05, ZOX:[id number]  Admit date - 12/21/2019   Admitting Physician Bonnielee Haff, MD  Outpatient Primary MD for the patient is [place] [facility]   No chief complaint on file.      Brief Narrative    74 y.o. male with medical history significant of myasthenia gravis on prednisone, mixed hyperlipidemia,reports being Covid positive on January 4 has been having persistent fever and shortness of breath.  He feels more short of breath today and his wife decided to call EMS.  Patient was hypoxic in ED, he was transferred to [facility] 1/12 for further management.   Subjective:    Duane Santos today ports dyspnea with exertion, complains of cough, denies any fever, or chest pain, denies leg pain.    Assessment  & Plan :    Principal Problem:   Pneumonia due to COVID-19 virus Active Problems:   Acute respiratory failure with hypoxia (HCC)   Myasthenia gravis (HCC)   Normocytic anemia   Osteoporosis  Acute hypoxic respiratory failure due to COVID-19 pneumonia -Shunt with significant oxygen requirement on presentation, on 15 L high flow nasal cannula, this morning has been able to wean 10 to 12 L on room air at rest. -Was encouraged use incentive spirometry, flutter valve, to prone. -Continue with IV steroids.  On increased dose of dexamethasone. -Continue with IV remdesivir. -Received convalescent plasma 1/12 -Received Actemra 1/13. -Continue to trend inflammatory markers closely. -continue with IV Rocephin and azithromycin, to finish total of 5 days.   COVID-19 Labs  Recent Labs    12/21/19 2125 12/22/19 0520 12/23/19 0308  DDIMER  --  >20.00*  >20.00*  FERRITIN  --  645* 838*  CRP 18.5* 15.8* 11.0*    No results found for: SARSCOV2NAA  Acute left  DVT/PE -Patient was significantly elevated D-dimers -Most likely in the setting of COVID-19, will obtain venous Dopplers, meanwhile we will continue with [place] 40 mg subcu twice daily.  myasthenia gravis  -P.o. prednisone and CellCept at home, following  with [facility] neurology -Oral prednisone on hold as patient on IV hydrocortisone,  -Continue  to hold CellCept for now.  Spiculated lung mass -Possible malignancy, especially with some sclerotic lesion in the ribs, point patient is unstable for any work-up, this can be pursued as an outpatient once he is stable.   Code Status : DNR  Family Communication  : D/W wife via phone  Disposition Plan  : Home  Consults  :  none  DVT Prophylaxis  : [place] lovenox  Lab Results  Component Value Date   PLT 192 12/23/2019    Antibiotics  :    Anti-infectives (From admission, onward)   Start     Dose/Rate Route Frequency Ordered Stop   12/22/19 1000  remdesivir 100 mg in sodium chloride 0.9 % 100 mL IVPB     100 mg 200 mL/hr over 30 Minutes Intravenous Daily 12/21/19 1758 12/25/19 0959   12/21/19 1915  cefTRIAXone (ROCEPHIN) 1 g in sodium chloride 0.9 % 100 mL IVPB     1 g 200 mL/hr over 30 Minutes Intravenous Every 24 hours 12/21/19 1819 12/26/19 1914   12/21/19 1845  azithromycin (ZITHROMAX) 500 mg in sodium chloride 0.9 % 250 mL IVPB     500 mg 250 mL/hr over 60 Minutes Intravenous Every 24 hours 12/21/19 1819 12/26/19 1844        Objective:   Vitals:   12/23/19 0319 12/23/19 0400 12/23/19 0753 12/23/19 1204  BP: (!) 149/80  (!) 142/77 (!) 152/83  Pulse: 86  71 74  Resp: (!) 32     Temp:  98.9 F (37.2 C) 98.6 F (37 C) 97.8 F (36.6 C)  TempSrc:  Oral Oral Oral  SpO2: 92%  94% 92%  Weight:      Height:        Wt Readings from Last 3 Encounters:  12/21/19 57.1 kg  12/20/19 56.7 kg     Intake/Output  Summary (Last 24 hours) at 12/23/2019 1355 Last data filed at 12/23/2019 1103 Gross per 24 hour  Intake 366.06 ml  Output 2050 ml  Net -1683.94 ml     Physical Exam  Awake Alert, Oriented X 3, No new F.N deficits, Normal affect Symmetrical Chest wall movement, Good air movement bilaterally, CTAB RRR,No Gallops,Rubs or new Murmurs, No Parasternal Heave +ve B.Sounds, Abd Soft, No tenderness, No rebound - guarding or rigidity. No Cyanosis, Clubbing or edema, No new Rash or bruise        Data Review:    CBC Recent Labs  Lab 12/20/19 1259 12/20/19 2204 12/22/19 0520 12/23/19 0308  WBC 9.4 7.8 10.5 11.2*  HGB 12.4* 12.2* 11.6* 11.5*  HCT 36.3* 36.5* 34.1* 33.8*  PLT 271 270 226 192  MCV 96.0 97.1 97.4 96.0  MCH 32.8 32.4 33.1 32.7  MCHC 34.2 33.4 34.0 34.0  RDW 14.0 14.2 14.4 14.3  LYMPHSABS 0.2*  --  0.2* 0.2*  MONOABS 0.6  --  0.6 0.4  EOSABS 0.0  --  0.0 0.0  BASOSABS 0.0  --  0.0 0.1    Chemistries  Recent Labs  Lab 12/20/19 1259 12/22/19 0520 12/23/19 0308  NA 135 134* 135  K 4.2 4.9 4.7  CL 102 102 105  CO2 23 23 24   GLUCOSE 107* 128* 129*  BUN 21 31* 29*  CREATININE 0.97 0.87 0.67  CALCIUM 8.9 8.6* 8.8*  MG  --  2.3 2.2  AST 35 42* 44*  ALT 21 30 35  ALKPHOS  81 76 83  BILITOT 0.7 0.7 0.8   ------------------------------------------------------------------------------------------------------------------ No results for input(s): CHOL, HDL, LDLCALC, TRIG, CHOLHDL, LDLDIRECT in the last 72 hours.  No results found for: HGBA1C ------------------------------------------------------------------------------------------------------------------ No results for input(s): TSH, T4TOTAL, T3FREE, THYROIDAB in the last 72 hours.  Invalid input(s): FREET3 ------------------------------------------------------------------------------------------------------------------ Recent Labs    12/22/19 0520 12/23/19 0308  FERRITIN 645* 838*    Coagulation profile No  results for input(s): INR, PROTIME in the last 168 hours.  Recent Labs    12/22/19 0520 12/23/19 0308  DDIMER >20.00* >20.00*    Cardiac Enzymes No results for input(s): CKMB, TROPONINI, MYOGLOBIN in the last 168 hours.  Invalid input(s): CK ------------------------------------------------------------------------------------------------------------------ No results found for: BNP  Inpatient Medications  Scheduled Meds: . albuterol  2 puff Inhalation Q6H  . vitamin C  500 mg Oral Daily  . aspirin  81 mg Oral Daily  . calcium-vitamin D  1 tablet Oral TID WC  . dexamethasone (DECADRON) injection  6 mg Intravenous Q12H  . docusate sodium  100 mg Oral BID  . enoxaparin (LOVENOX) injection  1 mg/kg Subcutaneous Q12H  . famotidine  20 mg Oral Daily  . folic acid  1 mg Oral Daily  . multivitamin with minerals  1 tablet Oral QODAY  . thiamine  100 mg Oral Daily  . zinc sulfate  220 mg Oral Daily   Continuous Infusions: . azithromycin 500 mg (12/22/19 1701)  . cefTRIAXone (ROCEPHIN)  IV 1 g (12/22/19 1836)  . remdesivir 100 mg in NS 100 mL 100 mg (12/23/19 0947)   PRN Meds:.acetaminophen, chlorpheniramine-HYDROcodone, guaiFENesin-dextromethorphan, Melatonin, ondansetron **OR** ondansetron (ZOFRAN) IV, phenol  Micro Results Recent Results (from the past 240 hour(s))  Blood Culture (routine x 2)     Status: None (Preliminary result)   Collection Time: 12/20/19 12:59 PM   Specimen: BLOOD  Result Value Ref Range Status   Specimen Description BLOOD RIGHT AC  Final   Special Requests   Final    BOTTLES DRAWN AEROBIC AND ANAEROBIC Blood Culture results may not be optimal due to an excessive volume of blood received in culture bottles   Culture   Final    NO GROWTH 3 DAYS Performed at [facility], [address] [place], [place] [address]    Report Status PENDING  Incomplete  Blood Culture (routine x 2)     Status: None (Preliminary result)   Collection Time: 12/20/19   1:00 PM   Specimen: BLOOD  Result Value Ref Range Status   Specimen Description BLOOD BLOOD RIGHT HAND  Final   Special Requests   Final    BOTTLES DRAWN AEROBIC AND ANAEROBIC Blood Culture adequate volume   Culture   Final    NO GROWTH 3 DAYS Performed at [facility], [address] [place], [place] [address]    Report Status PENDING  Incomplete  Urine culture     Status: None   Collection Time: 12/20/19  1:00 PM   Specimen: In/Out Cath Urine  Result Value Ref Range Status   Specimen Description   Final    IN/OUT CATH URINE Performed at [facility], [address] [place], [place] [address]    Special Requests   Final    NONE Performed at [facility], [address], [place], [place] [address]    Culture   Final    NO GROWTH Performed at [facility], [place] [address], [place], [place] [address]    Report Status  12/21/2019 FINAL  Final    Radiology Reports CT ANGIO CHEST PE W OR WO CONTRAST  Result Date: 12/23/2019 CLINICAL DATA:  Shortness of breath. Covid 19. hypoxic, D Dimer >20 EXAM: CT ANGIOGRAPHY CHEST WITH CONTRAST TECHNIQUE: Multidetector CT imaging of the chest was performed using the standard protocol during bolus administration of intravenous contrast. Multiplanar CT image reconstructions and MIPs were obtained to evaluate the vascular anatomy. CONTRAST:  30mL OMNIPAQUE IOHEXOL 350 MG/ML SOLN COMPARISON:  12/22/2019 chest x-ray FINDINGS: Cardiovascular: There is atherosclerotic calcification of the coronary vessels and thoracic aorta. No aneurysm. The pulmonary artery to the superior segment of the RIGHT LOWER lobe is occluded with thrombus. Focal thrombus is identified within the RIGHT UPPER lobe pulmonary artery branch. Small subsegmental pulmonary emboli would be difficult to exclude due to technique. Mediastinum/Nodes: Small hiatal hernia. No significant mediastinal, hilar, or axillary adenopathy. The visualized portion of the  thyroid gland has a normal appearance. Lungs/Pleura: Spiculated solid mass is identified within the superior segment of the RIGHT LOWER lobe and measures 2.3 x 2.1 centimeters. LEFT UPPER lobe pulmonary nodule is 4 millimeters on image 44 of series 6. There is diffuse bilateral septal wall thickening. Opacities relatively spare the lung apices and superior segments of the LOWER lobes. Some confluent opacities are identified posteriorly. No pleural effusions. Architectural distortion identified at both lung bases, LEFT greater than RIGHT. Upper Abdomen: No acute abnormality. Musculoskeletal: There is sclerosis not associated with deformity of the RIGHT third, fourth, and 5th ribs. Deformity of LEFT third and fourth ribs consistent prior fractures. Compression fractures of T3, T5, T6, and T12. Review of the MIP images confirms the above findings. IMPRESSION: 1. Study is positive for pulmonary emboli involving the superior segment RIGHT LOWER pulmonary artery and RIGHT UPPER lobe pulmonary artery. 2. Spiculated solid mass within the superior segment of the RIGHT LOWER lobe, suspicious for malignancy. 3. A 4 mm LEFT UPPER lobe pulmonary nodule. 4. Bilateral septal wall thickening and confluent interstitial opacities throughout the lungs. 5. Architectural distortion at both lung bases, LEFT greater than RIGHT. 6. Sclerotic changes of the RIGHT third, fourth, and 5th ribs, suspicious for metastatic disease. 7. Small hiatal hernia. 8. Coronary artery disease. 9. Aortic Atherosclerosis (ICD10-I70.0). 10. Numerous thoracic compression fractures. These results were called by telephone at the time of interpretation on 12/23/2019 at 9:36 am to provider Edit Ricciardelli [facility] , who verbally acknowledged these results. Electronically Signed   By: Nolon Nations M.D.   On: 12/23/2019 09:37   PCXR COVID AM  Result Date: 12/22/2019 CLINICAL DATA:  COVID-19 positive. EXAM: PORTABLE CHEST 1 VIEW COMPARISON:  December 20, 2019. FINDINGS:  The heart size and mediastinal contours are within normal limits. No pneumothorax pleural effusion is noted. Old left rib fractures are noted. Mild interstitial densities are noted throughout both lungs suggesting atypical infection. IMPRESSION: Mild interstitial densities are noted throughout both lungs suggesting atypical infection. Followup radiographs are recommended until resolution. No pneumothorax is noted. Electronically Signed   By: Marijo Conception M.D.   On: 12/22/2019 09:11   DG Chest Port 1 View  Result Date: 12/20/2019 CLINICAL DATA:  COVID-19 positive with fever EXAM: PORTABLE CHEST 1 VIEW COMPARISON:  None. FINDINGS: There is ill-defined airspace opacity throughout portions of each upper lobe as well as to a lesser degree in the right lower lung region. No consolidation. Heart size and pulmonary vascularity are normal. No adenopathy. There old healed rib fractures on the left with remodeling. There is mild thoracolumbar levoscoliosis.  No pneumothorax. IMPRESSION: Ill-defined airspace opacity bilaterally, more on the right than on the left, without consolidation. Suspect atypical organism multifocal pneumonia. Cardiac silhouette normal.  No adenopathy. Electronically Signed   By: Lowella Grip III M.D.   On: 12/20/2019 12:33     Phillips Climes M.D on 12/23/2019 at 1:55 PM  Between 7am to 7pm - Pager - [phone]  After 7pm go to [email or web address] - password [facility]  [facility] -  Office  [phone]
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
## 2019-12-23 NOTE — Progress Notes (Signed)
NIF >-40.  Patient with great effort.

## 2019-12-24 DIAGNOSIS — M810 Age-related osteoporosis without current pathological fracture: Secondary | ICD-10-CM

## 2019-12-24 LAB — CBC WITH DIFFERENTIAL/PLATELET
Abs Immature Granulocytes: 1.07 10*3/uL — ABNORMAL HIGH (ref 0.00–0.07)
Basophils Absolute: 0.1 10*3/uL (ref 0.0–0.1)
Basophils Relative: 1 %
Eosinophils Absolute: 0 10*3/uL (ref 0.0–0.5)
Eosinophils Relative: 0 %
HCT: 35.9 % — ABNORMAL LOW (ref 39.0–52.0)
Hemoglobin: 12.2 g/dL — ABNORMAL LOW (ref 13.0–17.0)
Immature Granulocytes: 7 %
Lymphocytes Relative: 2 %
Lymphs Abs: 0.3 10*3/uL — ABNORMAL LOW (ref 0.7–4.0)
MCH: 32.7 pg (ref 26.0–34.0)
MCHC: 34 g/dL (ref 30.0–36.0)
MCV: 96.2 fL (ref 80.0–100.0)
Monocytes Absolute: 0.6 10*3/uL (ref 0.1–1.0)
Monocytes Relative: 4 %
Neutro Abs: 13.2 10*3/uL — ABNORMAL HIGH (ref 1.7–7.7)
Neutrophils Relative %: 86 %
Platelets: 224 10*3/uL (ref 150–400)
RBC: 3.73 MIL/uL — ABNORMAL LOW (ref 4.22–5.81)
RDW: 14.1 % (ref 11.5–15.5)
WBC: 15.3 10*3/uL — ABNORMAL HIGH (ref 4.0–10.5)
nRBC: 0.2 % (ref 0.0–0.2)

## 2019-12-24 LAB — COMPREHENSIVE METABOLIC PANEL
ALT: 66 U/L — ABNORMAL HIGH (ref 0–44)
AST: 74 U/L — ABNORMAL HIGH (ref 15–41)
Albumin: 2.6 g/dL — ABNORMAL LOW (ref 3.5–5.0)
Alkaline Phosphatase: 93 U/L (ref 38–126)
Anion gap: 11 (ref 5–15)
BUN: 33 mg/dL — ABNORMAL HIGH (ref 8–23)
CO2: 22 mmol/L (ref 22–32)
Calcium: 8.7 mg/dL — ABNORMAL LOW (ref 8.9–10.3)
Chloride: 104 mmol/L (ref 98–111)
Creatinine, Ser: 0.75 mg/dL (ref 0.61–1.24)
GFR calc Af Amer: >60 mL/min (ref >60)
GFR calc non Af Amer: >60 mL/min (ref >60)
Glucose, Bld: 168 mg/dL — ABNORMAL HIGH (ref 70–99)
Potassium: 4.1 mmol/L (ref 3.5–5.1)
Sodium: 137 mmol/L (ref 135–145)
Total Bilirubin: 0.3 mg/dL (ref 0.3–1.2)
Total Protein: 5.4 g/dL — ABNORMAL LOW (ref 6.5–8.1)

## 2019-12-24 LAB — MAGNESIUM: Magnesium: 2.2 mg/dL (ref 1.7–2.4)

## 2019-12-24 LAB — FERRITIN: Ferritin: 684 ng/mL — ABNORMAL HIGH (ref 24–336)

## 2019-12-24 LAB — D-DIMER, QUANTITATIVE: D-Dimer, Quant: >20 ug/mL-FEU — ABNORMAL HIGH (ref 0.00–0.50)

## 2019-12-24 LAB — C-REACTIVE PROTEIN: CRP: 8.3 mg/dL — ABNORMAL HIGH (ref <1.0)

## 2019-12-24 MED ORDER — DEXAMETHASONE SODIUM PHOSPHATE 10 MG/ML IJ SOLN
6.0000 mg | Freq: Every day | INTRAMUSCULAR | Status: DC
  Administered 2019-12-25 – 2020-01-01 (×8): 6 mg via INTRAVENOUS
  Filled 2019-12-24 (×8): qty 1

## 2019-12-24 NOTE — Progress Notes (Signed)
With the patient's permission, Alex,RN from [facility] was updated on patient's plan of care. Alex provided Dr.Mehrabyn's number, which was sent via secure chat to [place].

## 2019-12-24 NOTE — Progress Notes (Addendum)
2 attempts made to call family . Unable to reach family at this time for updates.  Patient requesting to not be disturbed until 5 am. Sign placed on door. Personal belongings and call bell placed in reach. Continuous pulse ox and cardiac monitor on.

## 2019-12-24 NOTE — Progress Notes (Addendum)
PROGRESS NOTE                                                                                                                                                                                                             Patient Demographics:    Duane Santos, is a 74 y.o. male, DOB - 05-May-1946, DJM:[id number]  Admit date - 12/21/2019   Admitting Physician Bonnielee Haff, MD  Outpatient Primary MD for the patient is [place] [facility]   No chief complaint on file.      Brief Narrative    74 y.o. male with medical history significant of myasthenia gravis on prednisone, mixed hyperlipidemia,reports being Covid positive on January 4 has been having persistent fever and shortness of breath.  He feels more short of breath today and his wife decided to call EMS.  Patient was hypoxic in ED, he was transferred to [facility] 1/12 for further management.   Subjective:    Duane Santos today ports dyspnea with exertion, ports cough, productive, denies fever, chills, no chest pain or leg swelling .    Assessment  & Plan :    Principal Problem:   Pneumonia due to COVID-19 virus Active Problems:   Acute respiratory failure with hypoxia (HCC)   Myasthenia gravis (HCC)   Normocytic anemia   Osteoporosis  Acute hypoxic respiratory failure due to COVID-19 pneumonia -Shunt with significant oxygen requirement on presentation, on 15 L high flow nasal cannula, this morning has been able to wean to 9 L high flow nasal cannula -Was encouraged use incentive spirometry, flutter valve, to prone. -Continue with IV steroids.  On increased dose of dexamethasone. -Continue with IV remdesivir. -Received convalescent plasma 1/12 -Received Actemra 1/13. -Continue to trend inflammatory markers closely.  D-dimers remains elevated, but CRP trending down -Treated with IV Rocephin and azithromycin, will discontinue azithromycin as discussed with primary neurologist at  [facility].   COVID-19 Labs  Recent Labs    12/22/19 0520 12/23/19 0308 12/24/19 0030  DDIMER >20.00* >20.00* >20.00*  FERRITIN 645* 838* 684*  CRP 15.8* 11.0* 8.3*    No results found for: SARSCOV2NAA  Acute left  DVT/PE -Patient was significantly elevated D-dimers -Most likely in the setting of COVID-19, venous Doppler significant for acute left DVT, as well his CTA chest significant for PE . -Continue with Lovenox  for anticoagulation, will transition to Eliquis once stable .  myasthenia gravis  -P.o. prednisone and CellCept at home, following with [facility] neurology -Oral prednisone on hold as patient on IV hydrocortisone,  -Continue  to hold CellCept for now. -Discussed with primary neurologist at [facility] Dr Westley Foots ,[name] , will stop azithromycin. -Continue to monitor NIF, 4 has been stable at - 40's admission.  Spiculated lung mass -Highly suspicious malignancy, especially with some sclerotic lesion in the ribs, point patient is unstable for any work-up, this can be pursued as an outpatient once he is stable.  Osteoporosis -Continue with calcium and vitamin D, resume Fosamax as an outpatient. -Patient having spinal compression fracture, most likely in the setting of osteoporosis.   Code Status : DNR  Family Communication  : D/W wife via phone 1/15  Disposition Plan  : Home  Consults  :  none  DVT Prophylaxis  : [place] lovenox  Lab Results  Component Value Date   PLT 224 12/24/2019    Antibiotics  :    Anti-infectives (From admission, onward)   Start     Dose/Rate Route Frequency Ordered Stop   12/23/19 2200  azithromycin (ZITHROMAX) tablet 500 mg  Status:  Discontinued     500 mg Oral  Once 12/23/19 2003 12/23/19 2020   12/23/19 2030  azithromycin (ZITHROMAX) tablet 500 mg     500 mg Oral  Once 12/23/19 2024 12/23/19 2106   12/22/19 1000  remdesivir 100 mg in sodium chloride 0.9 % 100 mL IVPB     100 mg 200 mL/hr over 30 Minutes Intravenous Daily  12/21/19 1758 12/24/19 1240   12/21/19 1915  cefTRIAXone (ROCEPHIN) 1 g in sodium chloride 0.9 % 100 mL IVPB     1 g 200 mL/hr over 30 Minutes Intravenous Every 24 hours 12/21/19 1819 12/26/19 1914   12/21/19 1845  azithromycin (ZITHROMAX) 500 mg in sodium chloride 0.9 % 250 mL IVPB  Status:  Discontinued     500 mg 250 mL/hr over 60 Minutes Intravenous Every 24 hours 12/21/19 1819 12/24/19 1558        Objective:   Vitals:   12/23/19 1204 12/23/19 1602 12/24/19 0800 12/24/19 1212  BP: (!) 152/83 (!) 151/75 138/78 131/65  Pulse: 74 70 91 82  Resp:  (!) 25 (!) 26 (!) 25  Temp: 97.8 F (36.6 C) 97.6 F (36.4 C) 98.5 F (36.9 C) 97.6 F (36.4 C)  TempSrc: Oral Oral Oral Oral  SpO2: 92% (!) 88% 94% 95%  Weight:      Height:        Wt Readings from Last 3 Encounters:  12/21/19 57.1 kg  12/20/19 56.7 kg     Intake/Output Summary (Last 24 hours) at 12/24/2019 1649 Last data filed at 12/24/2019 1100 Gross per 24 hour  Intake 240 ml  Output 302 ml  Net -62 ml     Physical Exam  Awake Alert, Oriented X 3, No new F.N deficits, Normal affect Symmetrical Chest wall movement, Good air movement bilaterally, CTAB RRR,No Gallops,Rubs or new Murmurs, No Parasternal Heave +ve B.Sounds, Abd Soft, No tenderness, No rebound - guarding or rigidity. No Cyanosis, Clubbing or edema, No new Rash or bruise        Data Review:    CBC Recent Labs  Lab 12/20/19 1259 12/20/19 2204 12/22/19 0520 12/23/19 0308 12/24/19 0030  WBC 9.4 7.8 10.5 11.2* 15.3*  HGB 12.4* 12.2* 11.6* 11.5* 12.2*  HCT 36.3* 36.5* 34.1* 33.8* 35.9*  PLT 271 270 226 192 224  MCV 96.0 97.1 97.4 96.0 96.2  MCH 32.8 32.4 33.1 32.7 32.7  MCHC 34.2 33.4 34.0 34.0 34.0  RDW 14.0 14.2 14.4 14.3 14.1  LYMPHSABS 0.2*  --  0.2* 0.2* 0.3*  MONOABS 0.6  --  0.6 0.4 0.6  EOSABS 0.0  --  0.0 0.0 0.0  BASOSABS 0.0  --  0.0 0.1 0.1    Chemistries  Recent Labs  Lab 12/20/19 1259 12/22/19 0520 12/23/19 0308  12/24/19 0030  NA 135 134* 135 137  K 4.2 4.9 4.7 4.1  CL 102 102 105 104  CO2 23 23 24 22   GLUCOSE 107* 128* 129* 168*  BUN 21 31* 29* 33*  CREATININE 0.97 0.87 0.67 0.75  CALCIUM 8.9 8.6* 8.8* 8.7*  MG  --  2.3 2.2 2.2  AST 35 42* 44* 74*  ALT 21 30 35 66*  ALKPHOS 81 76 83 93  BILITOT 0.7 0.7 0.8 0.3   ------------------------------------------------------------------------------------------------------------------ No results for input(s): CHOL, HDL, LDLCALC, TRIG, CHOLHDL, LDLDIRECT in the last 72 hours.  No results found for: HGBA1C ------------------------------------------------------------------------------------------------------------------ No results for input(s): TSH, T4TOTAL, T3FREE, THYROIDAB in the last 72 hours.  Invalid input(s): FREET3 ------------------------------------------------------------------------------------------------------------------ Recent Labs    12/23/19 0308 12/24/19 0030  FERRITIN 838* 684*    Coagulation profile No results for input(s): INR, PROTIME in the last 168 hours.  Recent Labs    12/23/19 0308 12/24/19 0030  DDIMER >20.00* >20.00*    Cardiac Enzymes No results for input(s): CKMB, TROPONINI, MYOGLOBIN in the last 168 hours.  Invalid input(s): CK ------------------------------------------------------------------------------------------------------------------ No results found for: BNP  Inpatient Medications  Scheduled Meds: . albuterol  2 puff Inhalation Q6H  . vitamin C  500 mg Oral Daily  . aspirin  81 mg Oral Daily  . calcium-vitamin D  1 tablet Oral TID WC  . dexamethasone (DECADRON) injection  6 mg Intravenous Q12H  . docusate sodium  100 mg Oral BID  . enoxaparin (LOVENOX) injection  1 mg/kg Subcutaneous Q12H  . famotidine  20 mg Oral Daily  . folic acid  1 mg Oral Daily  . multivitamin with minerals  1 tablet Oral QODAY  . thiamine  100 mg Oral Daily  . zinc sulfate  220 mg Oral Daily   Continuous  Infusions: . cefTRIAXone (ROCEPHIN)  IV Stopped (12/23/19 2230)   PRN Meds:.acetaminophen, chlorpheniramine-HYDROcodone, guaiFENesin-dextromethorphan, Melatonin, ondansetron **OR** ondansetron (ZOFRAN) IV, phenol  Micro Results Recent Results (from the past 240 hour(s))  Blood Culture (routine x 2)     Status: None (Preliminary result)   Collection Time: 12/20/19 12:59 PM   Specimen: BLOOD  Result Value Ref Range Status   Specimen Description BLOOD RIGHT AC  Final   Special Requests   Final    BOTTLES DRAWN AEROBIC AND ANAEROBIC Blood Culture results may not be optimal due to an excessive volume of blood received in culture bottles   Culture   Final    NO GROWTH 4 DAYS Performed at [facility], [address] [place], [place] [address]    Report Status PENDING  Incomplete  Blood Culture (routine x 2)     Status: None (Preliminary result)   Collection Time: 12/20/19  1:00 PM   Specimen: BLOOD  Result Value Ref Range Status   Specimen Description BLOOD BLOOD RIGHT HAND  Final   Special Requests   Final    BOTTLES DRAWN AEROBIC AND ANAEROBIC Blood Culture adequate volume   Culture  Final    NO GROWTH 4 DAYS Performed at [facility], [address] [place], [place] [address]    Report Status PENDING  Incomplete  Urine culture     Status: None   Collection Time: 12/20/19  1:00 PM   Specimen: In/Out Cath Urine  Result Value Ref Range Status   Specimen Description   Final    IN/OUT CATH URINE Performed at [facility], [address] [place], [place] [address]    Special Requests   Final    NONE Performed at [facility], [address] [place], [place] [address]    Culture   Final    NO GROWTH Performed at [facility], [place] [address], [place],  [address]    Report Status 12/21/2019 FINAL  Final    Radiology Reports CT ANGIO CHEST PE W OR WO CONTRAST  Result Date: 12/23/2019 CLINICAL DATA:  Shortness of breath.  Covid 19. hypoxic, D Dimer >20 EXAM: CT ANGIOGRAPHY CHEST WITH CONTRAST TECHNIQUE: Multidetector CT imaging of the chest was performed using the standard protocol during bolus administration of intravenous contrast. Multiplanar CT image reconstructions and MIPs were obtained to evaluate the vascular anatomy. CONTRAST:  17mL OMNIPAQUE IOHEXOL 350 MG/ML SOLN COMPARISON:  12/22/2019 chest x-ray FINDINGS: Cardiovascular: There is atherosclerotic calcification of the coronary vessels and thoracic aorta. No aneurysm. The pulmonary artery to the superior segment of the RIGHT LOWER lobe is occluded with thrombus. Focal thrombus is identified within the RIGHT UPPER lobe pulmonary artery branch. Small subsegmental pulmonary emboli would be difficult to exclude due to technique. Mediastinum/Nodes: Small hiatal hernia. No significant mediastinal, hilar, or axillary adenopathy. The visualized portion of the thyroid gland has a normal appearance. Lungs/Pleura: Spiculated solid mass is identified within the superior segment of the RIGHT LOWER lobe and measures 2.3 x 2.1 centimeters. LEFT UPPER lobe pulmonary nodule is 4 millimeters on image 44 of series 6. There is diffuse bilateral septal wall thickening. Opacities relatively spare the lung apices and superior segments of the LOWER lobes. Some confluent opacities are identified posteriorly. No pleural effusions. Architectural distortion identified at both lung bases, LEFT greater than RIGHT. Upper Abdomen: No acute abnormality. Musculoskeletal: There is sclerosis not associated with deformity of the RIGHT third, fourth, and 5th ribs. Deformity of LEFT third and fourth ribs consistent prior fractures. Compression fractures of T3, T5, T6, and T12. Review of the MIP images confirms the above findings. IMPRESSION: 1. Study is positive for pulmonary emboli involving the superior segment RIGHT LOWER pulmonary artery and RIGHT UPPER lobe pulmonary artery. 2. Spiculated solid mass within  the superior segment of the RIGHT LOWER lobe, suspicious for malignancy. 3. A 4 mm LEFT UPPER lobe pulmonary nodule. 4. Bilateral septal wall thickening and confluent interstitial opacities throughout the lungs. 5. Architectural distortion at both lung bases, LEFT greater than RIGHT. 6. Sclerotic changes of the RIGHT third, fourth, and 5th ribs, suspicious for metastatic disease. 7. Small hiatal hernia. 8. Coronary artery disease. 9. Aortic Atherosclerosis (ICD10-I70.0). 10. Numerous thoracic compression fractures. These results were called by telephone at the time of interpretation on 12/23/2019 at 9:36 am to provider Zaela Graley [facility] , who verbally acknowledged these results. Electronically Signed   By: Nolon Nations M.D.   On: 12/23/2019 09:37   PCXR COVID AM  Result Date: 12/22/2019 CLINICAL DATA:  COVID-19 positive. EXAM: PORTABLE CHEST 1 VIEW COMPARISON:  December 20, 2019. FINDINGS: The heart size and mediastinal contours are within normal limits. No  pneumothorax pleural effusion is noted. Old left rib fractures are noted. Mild interstitial densities are noted throughout both lungs suggesting atypical infection. IMPRESSION: Mild interstitial densities are noted throughout both lungs suggesting atypical infection. Followup radiographs are recommended until resolution. No pneumothorax is noted. Electronically Signed   By: Marijo Conception M.D.   On: 12/22/2019 09:11   DG Chest Port 1 View  Result Date: 12/20/2019 CLINICAL DATA:  COVID-19 positive with fever EXAM: PORTABLE CHEST 1 VIEW COMPARISON:  None. FINDINGS: There is ill-defined airspace opacity throughout portions of each upper lobe as well as to a lesser degree in the right lower lung region. No consolidation. Heart size and pulmonary vascularity are normal. No adenopathy. There old healed rib fractures on the left with remodeling. There is mild thoracolumbar levoscoliosis. No pneumothorax. IMPRESSION: Ill-defined airspace opacity bilaterally,  more on the right than on the left, without consolidation. Suspect atypical organism multifocal pneumonia. Cardiac silhouette normal.  No adenopathy. Electronically Signed   By: Lowella Grip III M.D.   On: 12/20/2019 12:33   VAS [place] LOWER EXTREMITY VENOUS (DVT)  Result Date: 12/24/2019  Lower Venous Study Indications: Elevated d-dimer. COVID positive.  Comparison Study: No prior study. Performing Technologist: Maudry Mayhew MHA, RDMS, RVT, RDCS  Examination Guidelines: A complete evaluation includes B-mode imaging, spectral Doppler, color Doppler, and power Doppler as needed of all accessible portions of each vessel. Bilateral testing is considered an integral part of a complete examination. Limited examinations for reoccurring indications may be performed as noted.  +---------+---------------+---------+-----------+----------+--------------+ RIGHT    CompressibilityPhasicitySpontaneityPropertiesThrombus Aging +---------+---------------+---------+-----------+----------+--------------+ CFV      Full           Yes      Yes                                 +---------+---------------+---------+-----------+----------+--------------+ SFJ      Full                                                        +---------+---------------+---------+-----------+----------+--------------+ FV Prox  Full                                                        +---------+---------------+---------+-----------+----------+--------------+ FV Mid   Full                                                        +---------+---------------+---------+-----------+----------+--------------+ FV DistalFull                                                        +---------+---------------+---------+-----------+----------+--------------+ PFV      Full                                                        +---------+---------------+---------+-----------+----------+--------------+  POP      Full            Yes      Yes                                 +---------+---------------+---------+-----------+----------+--------------+ PTV      Full                                                        +---------+---------------+---------+-----------+----------+--------------+ PERO     Full                                                        +---------+---------------+---------+-----------+----------+--------------+   +---------+---------------+---------+-----------+----------+--------------+ LEFT     CompressibilityPhasicitySpontaneityPropertiesThrombus Aging +---------+---------------+---------+-----------+----------+--------------+ CFV      Full           Yes      Yes                                 +---------+---------------+---------+-----------+----------+--------------+ SFJ      Full                                                        +---------+---------------+---------+-----------+----------+--------------+ FV Prox  Full                                                        +---------+---------------+---------+-----------+----------+--------------+ FV Mid   Full                                                        +---------+---------------+---------+-----------+----------+--------------+ FV DistalFull                                                        +---------+---------------+---------+-----------+----------+--------------+ PFV      Full                                                        +---------+---------------+---------+-----------+----------+--------------+ POP      Full           Yes      Yes                                 +---------+---------------+---------+-----------+----------+--------------+  PTV      None                    No                   Acute          +---------+---------------+---------+-----------+----------+--------------+ PERO     Full                                                         +---------+---------------+---------+-----------+----------+--------------+     Summary: Right: There is no evidence of deep vein thrombosis in the lower extremity. No cystic structure found in the popliteal fossa. Left: Findings consistent with acute deep vein thrombosis involving the left posterior tibial veins. No cystic structure found in the popliteal fossa.  *See table(s) above for measurements and observations. Electronically signed by Monica Martinez MD on 12/24/2019 at 3:33:59 PM.    Final      Phillips Climes M.D on 12/24/2019 at 4:49 PM   [email or web address]   [facility] -  Office  [phone]

## 2019-12-24 NOTE — Progress Notes (Signed)
Pt pulled greater than -40 on NIF.  We do not have a vital capacity device in house.

## 2019-12-24 NOTE — Progress Notes (Signed)
Patient Right arm IV noted to be infiltrated (area  swollen, redness around sited and patient c/o of severe pain rated 7/10).   Plan  IV ABX stopped.  IV removed Cold ice pack applied /arm elevated. Tylenol for pain.   MD notified for possible treatment and the need for Midline due to multiple unsuccessful attempts for IV and frequent infiltration-patient also voice that he is unable to tolerate IV sticks.   Orders placed for Hylenex 49.5 units SQ-given.patient tolerated well.   Telephone order placed with read-back for IV consult + STAT midline placement. IV team at bedside and a LT brachial Midline was placed.   Prior Rocephin dose was then given. 1 dose of Azithromycin + 1 dose of dexamethasone oral given.   Patient was also noted to be non-compliant with all morning meds per pharmacy. F/u with patient -client verbalized he did not take meds because of the fear of cost of hospitalization. Teaching provided and patient given opportunity to ask questions. States he will now comply going forward.

## 2019-12-24 NOTE — Progress Notes (Signed)
Called wife's phone number, no answer, left a message with call back number

## 2019-12-25 LAB — CULTURE, BLOOD (ROUTINE X 2)
Culture: NO GROWTH
Culture: NO GROWTH
Special Requests: ADEQUATE

## 2019-12-25 LAB — CBC WITH DIFFERENTIAL/PLATELET
Abs Immature Granulocytes: 1.18 10*3/uL — ABNORMAL HIGH (ref 0.00–0.07)
Basophils Absolute: 0.2 10*3/uL — ABNORMAL HIGH (ref 0.0–0.1)
Basophils Relative: 1 %
Eosinophils Absolute: 0 10*3/uL (ref 0.0–0.5)
Eosinophils Relative: 0 %
HCT: 40.8 % (ref 39.0–52.0)
Hemoglobin: 14.1 g/dL (ref 13.0–17.0)
Immature Granulocytes: 6 %
Lymphocytes Relative: 2 %
Lymphs Abs: 0.3 10*3/uL — ABNORMAL LOW (ref 0.7–4.0)
MCH: 33 pg (ref 26.0–34.0)
MCHC: 34.6 g/dL (ref 30.0–36.0)
MCV: 95.6 fL (ref 80.0–100.0)
Monocytes Absolute: 0.4 10*3/uL (ref 0.1–1.0)
Monocytes Relative: 2 %
Neutro Abs: 17.2 10*3/uL — ABNORMAL HIGH (ref 1.7–7.7)
Neutrophils Relative %: 89 %
Platelets: 312 10*3/uL (ref 150–400)
RBC: 4.27 MIL/uL (ref 4.22–5.81)
RDW: 14.3 % (ref 11.5–15.5)
WBC: 19.3 10*3/uL — ABNORMAL HIGH (ref 4.0–10.5)
nRBC: 0.3 % — ABNORMAL HIGH (ref 0.0–0.2)

## 2019-12-25 LAB — COMPREHENSIVE METABOLIC PANEL
ALT: 110 U/L — ABNORMAL HIGH (ref 0–44)
AST: 92 U/L — ABNORMAL HIGH (ref 15–41)
Albumin: 3 g/dL — ABNORMAL LOW (ref 3.5–5.0)
Alkaline Phosphatase: 113 U/L (ref 38–126)
Anion gap: 11 (ref 5–15)
BUN: 28 mg/dL — ABNORMAL HIGH (ref 8–23)
CO2: 22 mmol/L (ref 22–32)
Calcium: 9.4 mg/dL (ref 8.9–10.3)
Chloride: 101 mmol/L (ref 98–111)
Creatinine, Ser: 0.88 mg/dL (ref 0.61–1.24)
GFR calc Af Amer: >60 mL/min (ref >60)
GFR calc non Af Amer: >60 mL/min (ref >60)
Glucose, Bld: 143 mg/dL — ABNORMAL HIGH (ref 70–99)
Potassium: 4.4 mmol/L (ref 3.5–5.1)
Sodium: 134 mmol/L — ABNORMAL LOW (ref 135–145)
Total Bilirubin: 0.7 mg/dL (ref 0.3–1.2)
Total Protein: 6 g/dL — ABNORMAL LOW (ref 6.5–8.1)

## 2019-12-25 LAB — C-REACTIVE PROTEIN: CRP: 4 mg/dL — ABNORMAL HIGH (ref <1.0)

## 2019-12-25 LAB — D-DIMER, QUANTITATIVE: D-Dimer, Quant: >20 ug/mL-FEU — ABNORMAL HIGH (ref 0.00–0.50)

## 2019-12-25 LAB — MAGNESIUM: Magnesium: 2.1 mg/dL (ref 1.7–2.4)

## 2019-12-25 LAB — FERRITIN: Ferritin: 1139 ng/mL — ABNORMAL HIGH (ref 24–336)

## 2019-12-25 MED ORDER — SALINE SPRAY 0.65 % NA SOLN
1.0000 | NASAL | Status: DC | PRN
  Administered 2019-12-27: 08:00:00 1 via NASAL
  Filled 2019-12-25: qty 44

## 2019-12-25 MED ORDER — LIP MEDEX EX OINT
TOPICAL_OINTMENT | CUTANEOUS | Status: DC | PRN
  Filled 2019-12-25: qty 7

## 2019-12-25 NOTE — Progress Notes (Signed)
PROGRESS NOTE                                                                                                                                                                                                             Patient Demographics:    Duane Santos, is a 74 y.o. male, DOB - 01-Nov-1946, MPN:[id number]  Admit date - 12/21/2019   Admitting Physician Bonnielee Haff, MD  Outpatient Primary MD for the patient is [place] [facility]   No chief complaint on file.      Brief Narrative    74 y.o. male with medical history significant of myasthenia gravis on prednisone, mixed hyperlipidemia,reports being Covid positive on January 4 has been having persistent fever and shortness of breath.  He feels more short of breath today and his wife decided to call EMS.  Patient was hypoxic in ED, he was transferred to [facility] 1/12 for further management.   Subjective:    Duane Santos today ports she had a good night sleep, still some cough, nonproductive today, no fever, no chills, reports dyspnea with exertion .   Assessment  & Plan :    Principal Problem:   Pneumonia due to COVID-19 virus Active Problems:   Acute respiratory failure with hypoxia (HCC)   Myasthenia gravis (HCC)   Normocytic anemia   Osteoporosis  Acute hypoxic respiratory failure due to COVID-19 pneumonia -Patient with significant oxygen requirement on presentation, on 15 L high flow nasal cannula, has been improving slowly, this morning he is on 4 to 6 L, came significantly dyspneic with minimal exertion . -Was encouraged use incentive spirometry, flutter valve, to prone. -Continue with IV steroids.  On increased dose of dexamethasone. -Continue with IV remdesivir. -Received convalescent plasma 1/12 -Received Actemra 1/13. -Continue to trend inflammatory markers closely.  D-dimers remains elevated, written is trending up, but CRP trending down -Treated with IV Rocephin and  azithromycin, will discontinue azithromycin as discussed with primary neurologist at [facility].   COVID-19 Labs  Recent Labs    12/23/19 0308 12/24/19 0030 12/25/19 1122  DDIMER >20.00* >20.00* >20.00*  FERRITIN 838* 684* 1,139*  CRP 11.0* 8.3* 4.0*    No results found for: SARSCOV2NAA  Acute left  DVT/PE -Patient was significantly elevated D-dimers -Most likely in the setting of COVID-19, venous Doppler significant for acute left  DVT, as well his CTA chest significant for PE . -Continue with Lovenox for anticoagulation, will transition to Eliquis once stable .  myasthenia gravis  -P.o. prednisone and CellCept at home, following with [facility] neurology -Oral prednisone on hold as patient on IV hydrocortisone,  -Continue  to hold CellCept for now. -Discussed with primary neurologist at [facility] Dr Westley Foots ,[name] , will stop azithromycin as it may worsen myasthenia gravis. -Continue to monitor NIF, so for his neph has been stable at  -41  Spiculated lung mass -Highly suspicious malignancy, especially with some sclerotic lesion in the ribs, point patient is unstable for any work-up, this can be pursued as an outpatient once he is stable.  Osteoporosis -Continue with calcium and vitamin D, resume Fosamax as an outpatient. -Patient having spinal compression fracture, most likely in the setting of osteoporosis.   Code Status : DNR  Family Communication  : D/W wife via phone   Disposition Plan  : Home  Consults  :  none  DVT Prophylaxis  : [place] lovenox  Lab Results  Component Value Date   PLT 312 12/25/2019    Antibiotics  :    Anti-infectives (From admission, onward)   Start     Dose/Rate Route Frequency Ordered Stop   12/23/19 2200  azithromycin (ZITHROMAX) tablet 500 mg  Status:  Discontinued     500 mg Oral  Once 12/23/19 2003 12/23/19 2020   12/23/19 2030  azithromycin (ZITHROMAX) tablet 500 mg     500 mg Oral  Once 12/23/19 2024 12/23/19 2106   12/22/19 1000   remdesivir 100 mg in sodium chloride 0.9 % 100 mL IVPB     100 mg 200 mL/hr over 30 Minutes Intravenous Daily 12/21/19 1758 12/24/19 1240   12/21/19 1915  cefTRIAXone (ROCEPHIN) 1 g in sodium chloride 0.9 % 100 mL IVPB     1 g 200 mL/hr over 30 Minutes Intravenous Every 24 hours 12/21/19 1819 12/26/19 1914   12/21/19 1845  azithromycin (ZITHROMAX) 500 mg in sodium chloride 0.9 % 250 mL IVPB  Status:  Discontinued     500 mg 250 mL/hr over 60 Minutes Intravenous Every 24 hours 12/21/19 1819 12/24/19 1558        Objective:   Vitals:   12/25/19 0725 12/25/19 1115 12/25/19 1349 12/25/19 1607  BP: (!) 129/95 134/67  120/60  Pulse: 76 82  88  Resp: 19 20  (!) 22  Temp: 97.9 F (36.6 C) 97.8 F (36.6 C)  98.4 F (36.9 C)  TempSrc: Oral Oral  Oral  SpO2: 90% 93% 90% 90%  Weight:      Height:        Wt Readings from Last 3 Encounters:  12/21/19 57.1 kg  12/20/19 56.7 kg     Intake/Output Summary (Last 24 hours) at 12/25/2019 1723 Last data filed at 12/25/2019 1600 Gross per 24 hour  Intake 840 ml  Output 3700 ml  Net -2860 ml     Physical Exam  Awake Alert, Oriented X 3, No new F.N deficits, Normal affect Symmetrical Chest wall movement, Good air movement bilaterally, CTAB RRR,No Gallops,Rubs or new Murmurs, No Parasternal Heave +ve B.Sounds, Abd Soft, No tenderness, No rebound - guarding or rigidity. No Cyanosis, Clubbing or edema, No new Rash or bruise      Data Review:    CBC Recent Labs  Lab 12/20/19 1259 12/20/19 1259 12/20/19 2204 12/22/19 0520 12/23/19 0308 12/24/19 0030 12/25/19 1122  WBC 9.4   < >  7.8 10.5 11.2* 15.3* 19.3*  HGB 12.4*   < > 12.2* 11.6* 11.5* 12.2* 14.1  HCT 36.3*   < > 36.5* 34.1* 33.8* 35.9* 40.8  PLT 271   < > 270 226 192 224 312  MCV 96.0   < > 97.1 97.4 96.0 96.2 95.6  MCH 32.8   < > 32.4 33.1 32.7 32.7 33.0  MCHC 34.2   < > 33.4 34.0 34.0 34.0 34.6  RDW 14.0   < > 14.2 14.4 14.3 14.1 14.3  LYMPHSABS 0.2*  --   --  0.2* 0.2*  0.3* 0.3*  MONOABS 0.6  --   --  0.6 0.4 0.6 0.4  EOSABS 0.0  --   --  0.0 0.0 0.0 0.0  BASOSABS 0.0  --   --  0.0 0.1 0.1 0.2*   < > = values in this interval not displayed.    Chemistries  Recent Labs  Lab 12/20/19 1259 12/22/19 0520 12/23/19 0308 12/24/19 0030 12/25/19 1122  NA 135 134* 135 137 134*  K 4.2 4.9 4.7 4.1 4.4  CL 102 102 105 104 101  CO2 23 23 24 22 22   GLUCOSE 107* 128* 129* 168* 143*  BUN 21 31* 29* 33* 28*  CREATININE 0.97 0.87 0.67 0.75 0.88  CALCIUM 8.9 8.6* 8.8* 8.7* 9.4  MG  --  2.3 2.2 2.2 2.1  AST 35 42* 44* 74* 92*  ALT 21 30 35 66* 110*  ALKPHOS 81 76 83 93 113  BILITOT 0.7 0.7 0.8 0.3 0.7   ------------------------------------------------------------------------------------------------------------------ No results for input(s): CHOL, HDL, LDLCALC, TRIG, CHOLHDL, LDLDIRECT in the last 72 hours.  No results found for: HGBA1C ------------------------------------------------------------------------------------------------------------------ No results for input(s): TSH, T4TOTAL, T3FREE, THYROIDAB in the last 72 hours.  Invalid input(s): FREET3 ------------------------------------------------------------------------------------------------------------------ Recent Labs    12/24/19 0030 12/25/19 1122  FERRITIN 684* 1,139*    Coagulation profile No results for input(s): INR, PROTIME in the last 168 hours.  Recent Labs    12/24/19 0030 12/25/19 1122  DDIMER >20.00* >20.00*    Cardiac Enzymes No results for input(s): CKMB, TROPONINI, MYOGLOBIN in the last 168 hours.  Invalid input(s): CK ------------------------------------------------------------------------------------------------------------------ No results found for: BNP  Inpatient Medications  Scheduled Meds: . albuterol  2 puff Inhalation Q6H  . vitamin C  500 mg Oral Daily  . aspirin  81 mg Oral Daily  . calcium-vitamin D  1 tablet Oral TID WC  . dexamethasone (DECADRON)  injection  6 mg Intravenous Daily  . docusate sodium  100 mg Oral BID  . enoxaparin (LOVENOX) injection  1 mg/kg Subcutaneous Q12H  . famotidine  20 mg Oral Daily  . folic acid  1 mg Oral Daily  . multivitamin with minerals  1 tablet Oral QODAY  . thiamine  100 mg Oral Daily  . zinc sulfate  220 mg Oral Daily   Continuous Infusions: . cefTRIAXone (ROCEPHIN)  IV 1 g (12/24/19 1856)   PRN Meds:.acetaminophen, chlorpheniramine-HYDROcodone, guaiFENesin-dextromethorphan, Melatonin, ondansetron **OR** ondansetron (ZOFRAN) IV, phenol  Micro Results Recent Results (from the past 240 hour(s))  Blood Culture (routine x 2)     Status: None   Collection Time: 12/20/19 12:59 PM   Specimen: BLOOD  Result Value Ref Range Status   Specimen Description BLOOD RIGHT [facility]  Final   Special Requests   Final    BOTTLES DRAWN AEROBIC AND ANAEROBIC Blood Culture results may not be optimal due to an excessive volume of blood received in culture bottles  Culture   Final    NO GROWTH 5 DAYS Performed at [facility], [address] [place], [place] [address]    Report Status 12/25/2019 FINAL  Final  Blood Culture (routine x 2)     Status: None   Collection Time: 12/20/19  1:00 PM   Specimen: BLOOD  Result Value Ref Range Status   Specimen Description BLOOD BLOOD RIGHT HAND  Final   Special Requests   Final    BOTTLES DRAWN AEROBIC AND ANAEROBIC Blood Culture adequate volume   Culture   Final    NO GROWTH 5 DAYS Performed at [facility], [address] [place], [place] [address]    Report Status 12/25/2019 FINAL  Final  Urine culture     Status: None   Collection Time: 12/20/19  1:00 PM   Specimen: In/Out Cath Urine  Result Value Ref Range Status   Specimen Description   Final    IN/OUT CATH URINE Performed at [facility], [address] [place], [place] [address]    Special Requests   Final    NONE Performed at [facility], [address],  [place], [place] [address]    Culture   Final    NO GROWTH Performed at [facility], [place] [address], [place],  [address]    Report Status 12/21/2019 FINAL  Final    Radiology Reports CT ANGIO CHEST PE W OR WO CONTRAST  Result Date: 12/23/2019 CLINICAL DATA:  Shortness of breath. Covid 19. hypoxic, D Dimer >20 EXAM: CT ANGIOGRAPHY CHEST WITH CONTRAST TECHNIQUE: Multidetector CT imaging of the chest was performed using the standard protocol during bolus administration of intravenous contrast. Multiplanar CT image reconstructions and MIPs were obtained to evaluate the vascular anatomy. CONTRAST:  63mL OMNIPAQUE IOHEXOL 350 MG/ML SOLN COMPARISON:  12/22/2019 chest x-ray FINDINGS: Cardiovascular: There is atherosclerotic calcification of the coronary vessels and thoracic aorta. No aneurysm. The pulmonary artery to the superior segment of the RIGHT LOWER lobe is occluded with thrombus. Focal thrombus is identified within the RIGHT UPPER lobe pulmonary artery branch. Small subsegmental pulmonary emboli would be difficult to exclude due to technique. Mediastinum/Nodes: Small hiatal hernia. No significant mediastinal, hilar, or axillary adenopathy. The visualized portion of the thyroid gland has a normal appearance. Lungs/Pleura: Spiculated solid mass is identified within the superior segment of the RIGHT LOWER lobe and measures 2.3 x 2.1 centimeters. LEFT UPPER lobe pulmonary nodule is 4 millimeters on image 44 of series 6. There is diffuse bilateral septal wall thickening. Opacities relatively spare the lung apices and superior segments of the LOWER lobes. Some confluent opacities are identified posteriorly. No pleural effusions. Architectural distortion identified at both lung bases, LEFT greater than RIGHT. Upper Abdomen: No acute abnormality. Musculoskeletal: There is sclerosis not associated with deformity of the RIGHT third, fourth, and 5th ribs. Deformity of LEFT third and fourth ribs consistent  prior fractures. Compression fractures of T3, T5, T6, and T12. Review of the MIP images confirms the above findings. IMPRESSION: 1. Study is positive for pulmonary emboli involving the superior segment RIGHT LOWER pulmonary artery and RIGHT UPPER lobe pulmonary artery. 2. Spiculated solid mass within the superior segment of the RIGHT LOWER lobe, suspicious for malignancy. 3. A 4 mm LEFT UPPER lobe pulmonary nodule. 4. Bilateral septal wall thickening and confluent interstitial opacities throughout the lungs. 5. Architectural distortion at both lung bases, LEFT greater than RIGHT. 6. Sclerotic changes of the RIGHT third, fourth, and 5th ribs,  suspicious for metastatic disease. 7. Small hiatal hernia. 8. Coronary artery disease. 9. Aortic Atherosclerosis (ICD10-I70.0). 10. Numerous thoracic compression fractures. These results were called by telephone at the time of interpretation on 12/23/2019 at 9:36 am to provider Andi [facility] , who verbally acknowledged these results. Electronically Signed   By: Nolon Nations M.D.   On: 12/23/2019 09:37   PCXR COVID AM  Result Date: 12/22/2019 CLINICAL DATA:  COVID-19 positive. EXAM: PORTABLE CHEST 1 VIEW COMPARISON:  December 20, 2019. FINDINGS: The heart size and mediastinal contours are within normal limits. No pneumothorax pleural effusion is noted. Old left rib fractures are noted. Mild interstitial densities are noted throughout both lungs suggesting atypical infection. IMPRESSION: Mild interstitial densities are noted throughout both lungs suggesting atypical infection. Followup radiographs are recommended until resolution. No pneumothorax is noted. Electronically Signed   By: Marijo Conception M.D.   On: 12/22/2019 09:11   DG Chest Port 1 View  Result Date: 12/20/2019 CLINICAL DATA:  COVID-19 positive with fever EXAM: PORTABLE CHEST 1 VIEW COMPARISON:  None. FINDINGS: There is ill-defined airspace opacity throughout portions of each upper lobe as well as to a  lesser degree in the right lower lung region. No consolidation. Heart size and pulmonary vascularity are normal. No adenopathy. There old healed rib fractures on the left with remodeling. There is mild thoracolumbar levoscoliosis. No pneumothorax. IMPRESSION: Ill-defined airspace opacity bilaterally, more on the right than on the left, without consolidation. Suspect atypical organism multifocal pneumonia. Cardiac silhouette normal.  No adenopathy. Electronically Signed   By: Lowella Grip III M.D.   On: 12/20/2019 12:33   VAS [place] LOWER EXTREMITY VENOUS (DVT)  Result Date: 12/24/2019  Lower Venous Study Indications: Elevated d-dimer. COVID positive.  Comparison Study: No prior study. Performing Technologist: Maudry Mayhew MHA, RDMS, RVT, RDCS  Examination Guidelines: A complete evaluation includes B-mode imaging, spectral Doppler, color Doppler, and power Doppler as needed of all accessible portions of each vessel. Bilateral testing is considered an integral part of a complete examination. Limited examinations for reoccurring indications may be performed as noted.  +---------+---------------+---------+-----------+----------+--------------+ RIGHT    CompressibilityPhasicitySpontaneityPropertiesThrombus Aging +---------+---------------+---------+-----------+----------+--------------+ CFV      Full           Yes      Yes                                 +---------+---------------+---------+-----------+----------+--------------+ SFJ      Full                                                        +---------+---------------+---------+-----------+----------+--------------+ FV Prox  Full                                                        +---------+---------------+---------+-----------+----------+--------------+ FV Mid   Full                                                        +---------+---------------+---------+-----------+----------+--------------+  FV DistalFull                                                         +---------+---------------+---------+-----------+----------+--------------+ PFV      Full                                                        +---------+---------------+---------+-----------+----------+--------------+ POP      Full           Yes      Yes                                 +---------+---------------+---------+-----------+----------+--------------+ PTV      Full                                                        +---------+---------------+---------+-----------+----------+--------------+ PERO     Full                                                        +---------+---------------+---------+-----------+----------+--------------+   +---------+---------------+---------+-----------+----------+--------------+ LEFT     CompressibilityPhasicitySpontaneityPropertiesThrombus Aging +---------+---------------+---------+-----------+----------+--------------+ CFV      Full           Yes      Yes                                 +---------+---------------+---------+-----------+----------+--------------+ SFJ      Full                                                        +---------+---------------+---------+-----------+----------+--------------+ FV Prox  Full                                                        +---------+---------------+---------+-----------+----------+--------------+ FV Mid   Full                                                        +---------+---------------+---------+-----------+----------+--------------+ FV DistalFull                                                        +---------+---------------+---------+-----------+----------+--------------+  PFV      Full                                                        +---------+---------------+---------+-----------+----------+--------------+ POP      Full           Yes      Yes                                  +---------+---------------+---------+-----------+----------+--------------+ PTV      None                    No                   Acute          +---------+---------------+---------+-----------+----------+--------------+ PERO     Full                                                        +---------+---------------+---------+-----------+----------+--------------+     Summary: Right: There is no evidence of deep vein thrombosis in the lower extremity. No cystic structure found in the popliteal fossa. Left: Findings consistent with acute deep vein thrombosis involving the left posterior tibial veins. No cystic structure found in the popliteal fossa.  *See table(s) above for measurements and observations. Electronically signed by Monica Martinez MD on 12/24/2019 at 3:33:59 PM.    Final      Phillips Climes M.D on 12/25/2019 at 5:23 PM   [email or web address]   [facility] -  Office  [phone]

## 2019-12-25 NOTE — Progress Notes (Signed)
Wife called. She is updated. All questions answered. No other questions at this time.

## 2019-12-25 NOTE — Progress Notes (Signed)
Pt states she does not want to be disturbed until 5AM.  Pt had staff hand a sign on her door as well.  NIF and VC dropped at pt bedside at this time.  Exercises will begin in AM.

## 2019-12-26 LAB — CBC WITH DIFFERENTIAL/PLATELET
Abs Immature Granulocytes: 1.03 10*3/uL — ABNORMAL HIGH (ref 0.00–0.07)
Basophils Absolute: 0.2 10*3/uL — ABNORMAL HIGH (ref 0.0–0.1)
Basophils Relative: 1 %
Eosinophils Absolute: 0.1 10*3/uL (ref 0.0–0.5)
Eosinophils Relative: 1 %
HCT: 45.6 % (ref 39.0–52.0)
Hemoglobin: 15.4 g/dL (ref 13.0–17.0)
Immature Granulocytes: 5 %
Lymphocytes Relative: 3 %
Lymphs Abs: 0.5 10*3/uL — ABNORMAL LOW (ref 0.7–4.0)
MCH: 32.7 pg (ref 26.0–34.0)
MCHC: 33.8 g/dL (ref 30.0–36.0)
MCV: 96.8 fL (ref 80.0–100.0)
Monocytes Absolute: 0.2 10*3/uL (ref 0.1–1.0)
Monocytes Relative: 1 %
Neutro Abs: 17 10*3/uL — ABNORMAL HIGH (ref 1.7–7.7)
Neutrophils Relative %: 89 %
Platelets: 349 10*3/uL (ref 150–400)
RBC: 4.71 MIL/uL (ref 4.22–5.81)
RDW: 14.5 % (ref 11.5–15.5)
WBC: 19.1 10*3/uL — ABNORMAL HIGH (ref 4.0–10.5)
nRBC: 0.2 % (ref 0.0–0.2)

## 2019-12-26 LAB — COMPREHENSIVE METABOLIC PANEL
ALT: 121 U/L — ABNORMAL HIGH (ref 0–44)
AST: 90 U/L — ABNORMAL HIGH (ref 15–41)
Albumin: 3.1 g/dL — ABNORMAL LOW (ref 3.5–5.0)
Alkaline Phosphatase: 124 U/L (ref 38–126)
Anion gap: 13 (ref 5–15)
BUN: 32 mg/dL — ABNORMAL HIGH (ref 8–23)
CO2: 26 mmol/L (ref 22–32)
Calcium: 9.9 mg/dL (ref 8.9–10.3)
Chloride: 97 mmol/L — ABNORMAL LOW (ref 98–111)
Creatinine, Ser: 1.1 mg/dL (ref 0.61–1.24)
GFR calc Af Amer: >60 mL/min (ref >60)
GFR calc non Af Amer: >60 mL/min (ref >60)
Glucose, Bld: 145 mg/dL — ABNORMAL HIGH (ref 70–99)
Potassium: 4.9 mmol/L (ref 3.5–5.1)
Sodium: 136 mmol/L (ref 135–145)
Total Bilirubin: 1 mg/dL (ref 0.3–1.2)
Total Protein: 6.5 g/dL (ref 6.5–8.1)

## 2019-12-26 LAB — FERRITIN: Ferritin: 1160 ng/mL — ABNORMAL HIGH (ref 24–336)

## 2019-12-26 LAB — C-REACTIVE PROTEIN: CRP: 2.3 mg/dL — ABNORMAL HIGH (ref <1.0)

## 2019-12-26 LAB — D-DIMER, QUANTITATIVE: D-Dimer, Quant: 18.08 ug/mL-FEU — ABNORMAL HIGH (ref 0.00–0.50)

## 2019-12-26 LAB — MAGNESIUM: Magnesium: 2.3 mg/dL (ref 1.7–2.4)

## 2019-12-26 MED ORDER — VALACYCLOVIR HCL 500 MG PO TABS
2000.0000 mg | ORAL_TABLET | Freq: Once | ORAL | Status: AC
  Administered 2019-12-26: 13:00:00 2000 mg via ORAL
  Filled 2019-12-26: qty 4

## 2019-12-26 MED ORDER — APIXABAN 5 MG PO TABS
5.0000 mg | ORAL_TABLET | Freq: Two times a day (BID) | ORAL | Status: DC
  Administered 2020-01-02 – 2020-01-03 (×3): 5 mg via ORAL
  Filled 2019-12-26 (×3): qty 1

## 2019-12-26 MED ORDER — APIXABAN 5 MG PO TABS
10.0000 mg | ORAL_TABLET | Freq: Two times a day (BID) | ORAL | Status: AC
  Administered 2019-12-26 – 2020-01-01 (×13): 10 mg via ORAL
  Filled 2019-12-26 (×13): qty 2

## 2019-12-26 MED ORDER — ACYCLOVIR 5 % EX CREA
TOPICAL_CREAM | CUTANEOUS | Status: DC
  Filled 2019-12-26: qty 5

## 2019-12-26 MED ORDER — ACYCLOVIR 5 % EX OINT
TOPICAL_OINTMENT | CUTANEOUS | Status: DC
  Administered 2019-12-26 – 2020-01-01 (×6): 1 via TOPICAL
  Filled 2019-12-26 (×2): qty 15

## 2019-12-26 NOTE — Progress Notes (Signed)
Attempted to walk pt in room. Pts HR 140's. Pt symptomatic with dizziness and weakness. Pt was able to walk to window and back before needing to rest. Oxygen maintained at 88-90% on 4L [place]. MD notified as goal was to walk in hallways today. Plans to ambulate pt in room more today to help build up strength. After pt sat down, symptoms subsided.

## 2019-12-26 NOTE — Progress Notes (Signed)
NIF- >-40cm H20 on 3 good attempts IS- >1562ml x 3 good attempts

## 2019-12-26 NOTE — Progress Notes (Signed)
NIF performed with pt. -40 achieved with great effort. 2250 on IS and pep therapy performed as well. Pt tolerated well, RT will continue to monitor.
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
## 2019-12-26 NOTE — Progress Notes (Addendum)
PROGRESS NOTE                                                                                                                                                                                                             Patient Demographics:    Duane Santos, is a 74 y.o. male, DOB - [birth year], LOV:[id number]  Admit date - 12/21/2019   Admitting Physician Bonnielee Haff, MD  Outpatient Primary MD for the patient is [place] [facility]   No chief complaint on file.      Brief Narrative    74 y.o. male with medical history significant of myasthenia gravis on prednisone, mixed hyperlipidemia,reports being Covid positive on January 4 has been having persistent fever and shortness of breath.  He feels more short of breath today and his wife decided to call EMS.  Patient was hypoxic in ED, he was transferred to [facility] 1/12 for further management.   Subjective:    Duane Santos today reports dyspnea mainly on exertion , as any chest pain, no fever or chills, cough is nonproductive.    Assessment  & Plan :    Principal Problem:   Pneumonia due to COVID-19 virus Active Problems:   Acute respiratory failure with hypoxia (HCC)   Myasthenia gravis (HCC)   Normocytic anemia   Osteoporosis  Acute hypoxic respiratory failure due to COVID-19 pneumonia -Patient with significant oxygen requirement on presentation, on 15 L high flow nasal cannula, has been improving slowly, tolerating 4 L nasal cannula over last 24 hours at rest, becomes tachycardic, more hypoxic on exertion but recovers quickly . -Was encouraged use incentive spirometry, flutter valve, to prone. -Continue with IV steroids.  On increased dose of dexamethasone. -Continue with IV remdesivir. -Received convalescent plasma 1/12 -Received Actemra 1/13. -Continue to trend inflammatory markers closely.  D-dimers remains elevated, written is trending up, but CRP trending down -Treated with  IV Rocephin and azithromycin, will discontinue azithromycin as discussed with primary neurologist at [facility].   COVID-19 Labs  Recent Labs    12/24/19 0030 12/25/19 1122 12/26/19 1000  DDIMER >20.00* >20.00* 18.08*  FERRITIN 684* 1,139* 1,160*  CRP 8.3* 4.0* 2.3*    No results found for: SARSCOV2NAA  Acute left  DVT/PE -Patient was significantly elevated D-dimers -Most likely in the setting of COVID-19, venous Doppler significant for  acute left DVT, as well his CTA chest significant for PE . -Continue with Lovenox for anticoagulation, will transition to Eliquis once stable .  myasthenia gravis  -P.o. prednisone and CellCept at home, following with [facility] neurology -Oral prednisone on hold as patient on IV hydrocortisone,  -Continue  to hold CellCept for now. -Discussed with primary neurologist at [facility] Dr Westley Foots ,[name] , will stop azithromycin as it may worsen myasthenia gravis. -Continue to monitor NIF, so for his NIF has been stable at  -94  Spiculated lung mass -Highly suspicious malignancy, especially with some sclerotic lesion in the ribs, point patient is unstable for any work-up, this can be pursued as an outpatient once he is stable.  Herpes labialis -We will give 1 dose of 2000 mg Valtrex, and start on acyclovir ointment  Osteoporosis -Continue with calcium and vitamin D, resume Fosamax as an outpatient. -Patient having spinal compression fracture, most likely in the setting of osteoporosis.   Code Status : DNR  Family Communication  : D/W wife via phone  1/16  Disposition Plan  : Home  Consults  :  none  DVT Prophylaxis  : [place] lovenox  Lab Results  Component Value Date   PLT 349 12/26/2019    Antibiotics  :    Anti-infectives (From admission, onward)   Start     Dose/Rate Route Frequency Ordered Stop   12/26/19 1230  valACYclovir (VALTREX) tablet 2,000 mg     2,000 mg Oral  Once 12/26/19 1223 12/26/19 1256   12/23/19 2200  azithromycin  (ZITHROMAX) tablet 500 mg  Status:  Discontinued     500 mg Oral  Once 12/23/19 2003 12/23/19 2020   12/23/19 2030  azithromycin (ZITHROMAX) tablet 500 mg     500 mg Oral  Once 12/23/19 2024 12/23/19 2106   12/22/19 1000  remdesivir 100 mg in sodium chloride 0.9 % 100 mL IVPB     100 mg 200 mL/hr over 30 Minutes Intravenous Daily 12/21/19 1758 12/24/19 1240   12/21/19 1915  cefTRIAXone (ROCEPHIN) 1 g in sodium chloride 0.9 % 100 mL IVPB     1 g 200 mL/hr over 30 Minutes Intravenous Every 24 hours 12/21/19 1819 12/25/19 2113   12/21/19 1845  azithromycin (ZITHROMAX) 500 mg in sodium chloride 0.9 % 250 mL IVPB  Status:  Discontinued     500 mg 250 mL/hr over 60 Minutes Intravenous Every 24 hours 12/21/19 1819 12/24/19 1558        Objective:   Vitals:   12/26/19 0717 12/26/19 0800 12/26/19 1013 12/26/19 1245  BP: (!) 110/56   119/82  Pulse: 96  100 98  Resp: 20  (!) 22 20  Temp: 98.9 F (37.2 C)   98.5 F (36.9 C)  TempSrc: Oral   Oral  SpO2: 90% 91% 94% 92%  Weight:      Height:        Wt Readings from Last 3 Encounters:  12/21/19 57.1 kg  12/20/19 56.7 kg     Intake/Output Summary (Last 24 hours) at 12/26/2019 1522 Last data filed at 12/26/2019 0800 Gross per 24 hour  Intake 480 ml  Output 2200 ml  Net -1720 ml     Physical Exam  Awake Alert, Oriented X 3, No new F.N deficits, Normal affect Symmetrical Chest wall movement, Good air movement bilaterally, CTAB RRR,No Gallops,Rubs or new Murmurs, No Parasternal Heave +ve B.Sounds, Abd Soft, No tenderness, No rebound - guarding or rigidity. No Cyanosis, Clubbing or  edema, No new Rash or bruise       Data Review:    CBC Recent Labs  Lab 12/22/19 0520 12/23/19 0308 12/24/19 0030 12/25/19 1122 12/26/19 1000  WBC 10.5 11.2* 15.3* 19.3* 19.1*  HGB 11.6* 11.5* 12.2* 14.1 15.4  HCT 34.1* 33.8* 35.9* 40.8 45.6  PLT 226 192 224 312 349  MCV 97.4 96.0 96.2 95.6 96.8  MCH 33.1 32.7 32.7 33.0 32.7  MCHC 34.0  34.0 34.0 34.6 33.8  RDW 14.4 14.3 14.1 14.3 14.5  LYMPHSABS 0.2* 0.2* 0.3* 0.3* 0.5*  MONOABS 0.6 0.4 0.6 0.4 0.2  EOSABS 0.0 0.0 0.0 0.0 0.1  BASOSABS 0.0 0.1 0.1 0.2* 0.2*    Chemistries  Recent Labs  Lab 12/22/19 0520 12/23/19 0308 12/24/19 0030 12/25/19 1122 12/26/19 1000  NA 134* 135 137 134* 136  K 4.9 4.7 4.1 4.4 4.9  CL 102 105 104 101 97*  CO2 23 24 22 22 26   GLUCOSE 128* 129* 168* 143* 145*  BUN 31* 29* 33* 28* 32*  CREATININE 0.87 0.67 0.75 0.88 1.10  CALCIUM 8.6* 8.8* 8.7* 9.4 9.9  MG 2.3 2.2 2.2 2.1 2.3  AST 42* 44* 74* 92* 90*  ALT 30 35 66* 110* 121*  ALKPHOS 76 83 93 113 124  BILITOT 0.7 0.8 0.3 0.7 1.0   ------------------------------------------------------------------------------------------------------------------ No results for input(s): CHOL, HDL, LDLCALC, TRIG, CHOLHDL, LDLDIRECT in the last 72 hours.  No results found for: HGBA1C ------------------------------------------------------------------------------------------------------------------ No results for input(s): TSH, T4TOTAL, T3FREE, THYROIDAB in the last 72 hours.  Invalid input(s): FREET3 ------------------------------------------------------------------------------------------------------------------ Recent Labs    12/25/19 1122 12/26/19 1000  FERRITIN 1,139* 1,160*    Coagulation profile No results for input(s): INR, PROTIME in the last 168 hours.  Recent Labs    12/25/19 1122 12/26/19 1000  DDIMER >20.00* 18.08*    Cardiac Enzymes No results for input(s): CKMB, TROPONINI, MYOGLOBIN in the last 168 hours.  Invalid input(s): CK ------------------------------------------------------------------------------------------------------------------ No results found for: BNP  Inpatient Medications  Scheduled Meds: . acyclovir ointment   Topical Q3H  . albuterol  2 puff Inhalation Q6H  . vitamin C  500 mg Oral Daily  . aspirin  81 mg Oral Daily  . calcium-vitamin D  1 tablet  Oral TID WC  . dexamethasone (DECADRON) injection  6 mg Intravenous Daily  . docusate sodium  100 mg Oral BID  . enoxaparin (LOVENOX) injection  1 mg/kg Subcutaneous Q12H  . famotidine  20 mg Oral Daily  . folic acid  1 mg Oral Daily  . multivitamin with minerals  1 tablet Oral QODAY  . thiamine  100 mg Oral Daily  . zinc sulfate  220 mg Oral Daily   Continuous Infusions:  PRN Meds:.acetaminophen, chlorpheniramine-HYDROcodone, guaiFENesin-dextromethorphan, lip balm, Melatonin, ondansetron **OR** ondansetron (ZOFRAN) IV, phenol, sodium chloride  Micro Results Recent Results (from the past 240 hour(s))  Blood Culture (routine x 2)     Status: None   Collection Time: 12/20/19 12:59 PM   Specimen: BLOOD  Result Value Ref Range Status   Specimen Description BLOOD RIGHT AC  Final   Special Requests   Final    BOTTLES DRAWN AEROBIC AND ANAEROBIC Blood Culture results may not be optimal due to an excessive volume of blood received in culture bottles   Culture   Final    NO GROWTH 5 DAYS Performed at [facility], [address], [place], [place] [address]    Report Status 12/25/2019 FINAL  Final  Blood Culture (  routine x 2)     Status: None   Collection Time: 12/20/19  1:00 PM   Specimen: BLOOD  Result Value Ref Range Status   Specimen Description BLOOD BLOOD RIGHT HAND  Final   Special Requests   Final    BOTTLES DRAWN AEROBIC AND ANAEROBIC Blood Culture adequate volume   Culture   Final    NO GROWTH 5 DAYS Performed at [facility], [address] [place], [place] [address]    Report Status 12/25/2019 FINAL  Final  Urine culture     Status: None   Collection Time: 12/20/19  1:00 PM   Specimen: In/Out Cath Urine  Result Value Ref Range Status   Specimen Description   Final    IN/OUT CATH URINE Performed at [facility], [address], [place], [place] [address]    Special Requests   Final    NONE Performed at [facility], [address], [place], [place] [address]    Culture   Final    NO GROWTH Performed at [facility], [place] [address], [place], [place] [address]    Report Status 12/21/2019 FINAL  Final    Radiology Reports CT ANGIO CHEST PE W OR WO CONTRAST  Result Date: 12/23/2019 CLINICAL DATA:  Shortness of breath. Covid 19. hypoxic, D Dimer >20 EXAM: CT ANGIOGRAPHY CHEST WITH CONTRAST TECHNIQUE: Multidetector CT imaging of the chest was performed using the standard protocol during bolus administration of intravenous contrast. Multiplanar CT image reconstructions and MIPs were obtained to evaluate the vascular anatomy. CONTRAST:  63mL OMNIPAQUE IOHEXOL 350 MG/ML SOLN COMPARISON:  12/22/2019 chest x-ray FINDINGS: Cardiovascular: There is atherosclerotic calcification of the coronary vessels and thoracic aorta. No aneurysm. The pulmonary artery to the superior segment of the RIGHT LOWER lobe is occluded with thrombus. Focal thrombus is identified within the RIGHT UPPER lobe pulmonary artery branch. Small subsegmental pulmonary emboli would be difficult to exclude due to technique. Mediastinum/Nodes: Small hiatal hernia. No significant mediastinal, hilar, or axillary adenopathy. The visualized portion of the thyroid gland has a normal appearance. Lungs/Pleura: Spiculated solid mass is identified within the superior segment of the RIGHT LOWER lobe and measures 2.3 x 2.1 centimeters. LEFT UPPER lobe pulmonary nodule is 4 millimeters on image 44 of series 6. There is diffuse bilateral septal wall thickening. Opacities relatively spare the lung apices and superior segments of the LOWER lobes. Some confluent opacities are identified posteriorly. No pleural effusions. Architectural distortion identified at both lung bases, LEFT greater than RIGHT. Upper Abdomen: No acute abnormality. Musculoskeletal: There is sclerosis not associated with deformity of the RIGHT third, fourth, and 5th ribs. Deformity of LEFT third and fourth  ribs consistent prior fractures. Compression fractures of T3, T5, T6, and T12. Review of the MIP images confirms the above findings. IMPRESSION: 1. Study is positive for pulmonary emboli involving the superior segment RIGHT LOWER pulmonary artery and RIGHT UPPER lobe pulmonary artery. 2. Spiculated solid mass within the superior segment of the RIGHT LOWER lobe, suspicious for malignancy. 3. A 4 mm LEFT UPPER lobe pulmonary nodule. 4. Bilateral septal wall thickening and confluent interstitial opacities throughout the lungs. 5. Architectural distortion at both lung bases, LEFT greater than RIGHT. 6. Sclerotic changes of the RIGHT third, fourth, and 5th ribs, suspicious for metastatic disease. 7. Small hiatal hernia. 8. Coronary artery disease. 9. Aortic Atherosclerosis (ICD10-I70.0). 10. Numerous thoracic compression fractures. These results were called by telephone at the time of interpretation on 12/23/2019 at  9:36 am to provider Annie Saephan [facility] , who verbally acknowledged these results. Electronically Signed   By: Nolon Nations M.D.   On: 12/23/2019 09:37   PCXR COVID AM  Result Date: 12/22/2019 CLINICAL DATA:  COVID-19 positive. EXAM: PORTABLE CHEST 1 VIEW COMPARISON:  December 20, 2019. FINDINGS: The heart size and mediastinal contours are within normal limits. No pneumothorax pleural effusion is noted. Old left rib fractures are noted. Mild interstitial densities are noted throughout both lungs suggesting atypical infection. IMPRESSION: Mild interstitial densities are noted throughout both lungs suggesting atypical infection. Followup radiographs are recommended until resolution. No pneumothorax is noted. Electronically Signed   By: Marijo Conception M.D.   On: 12/22/2019 09:11   DG Chest Port 1 View  Result Date: 12/20/2019 CLINICAL DATA:  COVID-19 positive with fever EXAM: PORTABLE CHEST 1 VIEW COMPARISON:  None. FINDINGS: There is ill-defined airspace opacity throughout portions of each upper lobe as  well as to a lesser degree in the right lower lung region. No consolidation. Heart size and pulmonary vascularity are normal. No adenopathy. There old healed rib fractures on the left with remodeling. There is mild thoracolumbar levoscoliosis. No pneumothorax. IMPRESSION: Ill-defined airspace opacity bilaterally, more on the right than on the left, without consolidation. Suspect atypical organism multifocal pneumonia. Cardiac silhouette normal.  No adenopathy. Electronically Signed   By: Lowella Grip III M.D.   On: 12/20/2019 12:33   VAS [place] LOWER EXTREMITY VENOUS (DVT)  Result Date: 12/24/2019  Lower Venous Study Indications: Elevated d-dimer. COVID positive.  Comparison Study: No prior study. Performing Technologist: Maudry Mayhew MHA, RDMS, RVT, RDCS  Examination Guidelines: A complete evaluation includes B-mode imaging, spectral Doppler, color Doppler, and power Doppler as needed of all accessible portions of each vessel. Bilateral testing is considered an integral part of a complete examination. Limited examinations for reoccurring indications may be performed as noted.  +---------+---------------+---------+-----------+----------+--------------+ RIGHT    CompressibilityPhasicitySpontaneityPropertiesThrombus Aging +---------+---------------+---------+-----------+----------+--------------+ CFV      Full           Yes      Yes                                 +---------+---------------+---------+-----------+----------+--------------+ SFJ      Full                                                        +---------+---------------+---------+-----------+----------+--------------+ FV Prox  Full                                                        +---------+---------------+---------+-----------+----------+--------------+ FV Mid   Full                                                        +---------+---------------+---------+-----------+----------+--------------+ FV  DistalFull                                                        +---------+---------------+---------+-----------+----------+--------------+  PFV      Full                                                        +---------+---------------+---------+-----------+----------+--------------+ POP      Full           Yes      Yes                                 +---------+---------------+---------+-----------+----------+--------------+ PTV      Full                                                        +---------+---------------+---------+-----------+----------+--------------+ PERO     Full                                                        +---------+---------------+---------+-----------+----------+--------------+   +---------+---------------+---------+-----------+----------+--------------+ LEFT     CompressibilityPhasicitySpontaneityPropertiesThrombus Aging +---------+---------------+---------+-----------+----------+--------------+ CFV      Full           Yes      Yes                                 +---------+---------------+---------+-----------+----------+--------------+ SFJ      Full                                                        +---------+---------------+---------+-----------+----------+--------------+ FV Prox  Full                                                        +---------+---------------+---------+-----------+----------+--------------+ FV Mid   Full                                                        +---------+---------------+---------+-----------+----------+--------------+ FV DistalFull                                                        +---------+---------------+---------+-----------+----------+--------------+ PFV      Full                                                        +---------+---------------+---------+-----------+----------+--------------+  POP      Full           Yes      Yes                                  +---------+---------------+---------+-----------+----------+--------------+ PTV      None                    No                   Acute          +---------+---------------+---------+-----------+----------+--------------+ PERO     Full                                                        +---------+---------------+---------+-----------+----------+--------------+     Summary: Right: There is no evidence of deep vein thrombosis in the lower extremity. No cystic structure found in the popliteal fossa. Left: Findings consistent with acute deep vein thrombosis involving the left posterior tibial veins. No cystic structure found in the popliteal fossa.  *See table(s) above for measurements and observations. Electronically signed by Monica Martinez MD on 12/24/2019 at 3:33:59 PM.    Final      Phillips Climes M.D on 12/26/2019 at 3:22 PM   [email or web address]   [facility] -  Office  [phone]

## 2019-12-26 NOTE — Progress Notes (Signed)
Wife called and updated. All questions answered. She has no other questions at this time.

## 2019-12-27 LAB — CBC
HCT: 44.3 % (ref 39.0–52.0)
Hemoglobin: 15.2 g/dL (ref 13.0–17.0)
MCH: 33.3 pg (ref 26.0–34.0)
MCHC: 34.3 g/dL (ref 30.0–36.0)
MCV: 96.9 fL (ref 80.0–100.0)
Platelets: 344 10*3/uL (ref 150–400)
RBC: 4.57 MIL/uL (ref 4.22–5.81)
RDW: 14.8 % (ref 11.5–15.5)
WBC: 19.9 10*3/uL — ABNORMAL HIGH (ref 4.0–10.5)
nRBC: 0 % (ref 0.0–0.2)

## 2019-12-27 LAB — COMPREHENSIVE METABOLIC PANEL
ALT: 126 U/L — ABNORMAL HIGH (ref 0–44)
AST: 77 U/L — ABNORMAL HIGH (ref 15–41)
Albumin: 3.1 g/dL — ABNORMAL LOW (ref 3.5–5.0)
Alkaline Phosphatase: 122 U/L (ref 38–126)
Anion gap: 11 (ref 5–15)
BUN: 35 mg/dL — ABNORMAL HIGH (ref 8–23)
CO2: 26 mmol/L (ref 22–32)
Calcium: 11.3 mg/dL — ABNORMAL HIGH (ref 8.9–10.3)
Chloride: 98 mmol/L (ref 98–111)
Creatinine, Ser: 1.09 mg/dL (ref 0.61–1.24)
GFR calc Af Amer: >60 mL/min (ref >60)
GFR calc non Af Amer: >60 mL/min (ref >60)
Glucose, Bld: 108 mg/dL — ABNORMAL HIGH (ref 70–99)
Potassium: 4.1 mmol/L (ref 3.5–5.1)
Sodium: 135 mmol/L (ref 135–145)
Total Bilirubin: 1.1 mg/dL (ref 0.3–1.2)
Total Protein: 6.3 g/dL — ABNORMAL LOW (ref 6.5–8.1)

## 2019-12-27 LAB — D-DIMER, QUANTITATIVE: D-Dimer, Quant: 13.3 ug/mL-FEU — ABNORMAL HIGH (ref 0.00–0.50)

## 2019-12-27 MED ORDER — VALACYCLOVIR HCL 500 MG PO TABS
1000.0000 mg | ORAL_TABLET | Freq: Every day | ORAL | Status: AC
  Administered 2019-12-28 – 2019-12-29 (×2): 1000 mg via ORAL
  Filled 2019-12-27 (×3): qty 2

## 2019-12-27 NOTE — Evaluation (Signed)
Physical Therapy Evaluation Patient Details Name: Duane Santos MRN: [id number] DOB: 12/06/1946 Today's Date: 12/27/2019   History of Present Illness  Duane Santos adm with acute hypoxic respiratory failure due to covid PNA. Duane Santos also found to have LLE DVT and PE. Duane Santos found to have spiculated lung mass suspicious for malignancy to be worked up as outpatient.  PMH - myasthenia gravis, osteoporosis  Clinical Impression  Duane Santos admitted with above diagnosis and presents to Duane Santos with functional limitations due to deficits listed below (See Duane Santos problem list). Duane Santos needs skilled Duane Santos to maximize independence and safety to allow discharge to home with wife. Expect activity tolerance will improve as medical status improves.      Follow Up Recommendations No Duane Santos follow up    Equipment Recommendations  None recommended by Duane Santos    Recommendations for Other Services       Precautions / Restrictions Precautions Precautions: Fall Restrictions Weight Bearing Restrictions: No      Mobility  Bed Mobility               General bed mobility comments: Duane Santos up in chair  Transfers Overall transfer level: Needs assistance Equipment used: None Transfers: Sit to/from Stand Sit to Stand: Supervision         General transfer comment: supervision for lines/safety  Ambulation/Gait Ambulation/Gait assistance: Supervision Gait Distance (Feet): 25 Feet(x 2) Assistive device: None Gait Pattern/deviations: Step-through pattern;Decreased stride length Gait velocity: decr Gait velocity interpretation: <1.31 ft/sec, indicative of household ambulator General Gait Details: Assist for lines, safety and to monitor oxygen  Stairs            Wheelchair Mobility    Modified Rankin (Stroke Patients Only)       Balance Overall balance assessment: Mild deficits observed, not formally tested                                           Pertinent Vitals/Pain Pain Assessment: No/denies pain     Home Living Family/patient expects to be discharged to:: Private residence Living Arrangements: Spouse/significant other Available Help at Discharge: Family Type of Home: House Home Access: Stairs to enter Entrance Stairs-Rails: Can reach both Entrance Stairs-Number of Steps: 7 Home Layout: One level Home Equipment: Grab bars - tub/shower      Prior Function Level of Independence: Independent               Hand Dominance   Dominant Hand: Right    Extremity/Trunk Assessment   Upper Extremity Assessment Upper Extremity Assessment: Defer to OT evaluation    Lower Extremity Assessment Lower Extremity Assessment: Generalized weakness       Communication   Communication: No difficulties  Cognition Arousal/Alertness: Awake/alert Behavior During Therapy: WFL for tasks assessed/performed Overall Cognitive Status: Within Functional Limits for tasks assessed                                        General Comments General comments (skin integrity, edema, etc.): Duane Santos amb on 6L with SpO2 to the low 80's.     Exercises General Exercises - Upper Extremity Shoulder Flexion: AROM;10 reps;Theraband Theraband Level (Shoulder Flexion): Level 2 (Red) Shoulder ABduction: AROM;10 reps;Theraband Theraband Level (Shoulder Abduction): Level 2 (Red)   Assessment/Plan    Duane Santos Assessment Patient needs continued Duane Santos services  Duane Santos Problem List Decreased strength;Decreased balance;Decreased mobility       Duane Santos Treatment Interventions Gait training;Functional mobility training;Stair training;Therapeutic activities;Therapeutic exercise;Patient/family education    Duane Santos Goals (Current goals can be found in the Care Plan section)  Acute Rehab Duane Santos Goals Patient Stated Goal: Get his strength back up Duane Santos Goal Formulation: With patient Time For Goal Achievement: 01/10/20 Potential to Achieve Goals: Good    Frequency Min 3X/week   Barriers to discharge        Co-evaluation                AM-PAC Duane Santos "6 Clicks" Mobility  Outcome Measure Help needed turning from your back to your side while in a flat bed without using bedrails?: None Help needed moving from lying on your back to sitting on the side of a flat bed without using bedrails?: None Help needed moving to and from a bed to a chair (including a wheelchair)?: A Little Help needed standing up from a chair using your arms (e.g., wheelchair or bedside chair)?: A Little Help needed to walk in hospital room?: A Little Help needed climbing 3-5 steps with a railing? : A Little 6 Click Score: 20    End of Session Equipment Utilized During Treatment: Oxygen Activity Tolerance: Patient limited by fatigue Patient left: in chair;with call bell/phone within reach   Duane Santos Visit Diagnosis: Other abnormalities of gait and mobility (R26.89)    Time: 5188-4166 Duane Santos Time Calculation (min) (ACUTE ONLY): 22 min   Charges:   Duane Santos Evaluation $Duane Santos Eval Moderate Complexity: 1 Mod          Erik Burkett [place] Pager [phone] Office [place] 12/27/2019, 4:08 PM

## 2019-12-27 NOTE — Progress Notes (Signed)
Occupational Therapy Evaluation Patient Details Name: Duane Santos MRN: [id number] DOB: March 14, 1946 Today's Date: 12/27/2019    History of Present Illness Pt adm with acute hypoxic respiratory failure due to covid PNA. Pt also found to have LLE DVT and PE. Pt found to have spiculated lung mass suspicious for malignancy to be worked up as outpatient.  PMH - myasthenia gravis, osteoporosis   Clinical Impression   Patient is very hardworking, he usually completes full body workout every morning at home.  Lives with his wife and was independent prior.  Patient stated he is feeling weak.  Started the session on 4L and SpO2 97 at rest.  He was min guard with functional mobility mostly due to SpO2 dropping.  With standing ADLs SpO2 got to 78 on 4L, then 82 on 6L, then 90 on 8L.  He stood about 10 min and HR was elevated to 138/140.  After 5 min of rest and pursed lip breathing SpO2 returned to mid 90s.  He needs reminders to not challenge himself so much and to take rest breaks.  Educated on importance of slowing his RR and energy conservation.  Instructed on use of theraband for seated exercises. Will follow with OT acutely to address the deficits listed below.    Follow Up Recommendations  HHOT   Equipment Recommendations  3 in 1 bedside commode    Recommendations for Other Services       Precautions / Restrictions Precautions Precautions: Fall Restrictions Weight Bearing Restrictions: No      Mobility Bed Mobility                  Transfers Overall transfer level: Needs assistance Equipment used: None Transfers: Sit to/from Stand Sit to Stand: Supervision              Balance Overall balance assessment: Mild deficits observed, not formally tested                                         ADL either performed or assessed with clinical judgement   ADL Overall ADL's : Needs assistance/impaired Eating/Feeding: Independent;Sitting   Grooming:  Wash/dry hands;Wash/dry face;Oral care;Min guard;Standing   Upper Body Bathing: Min guard;Standing   Lower Body Bathing: Min guard;Sit to/from stand   Upper Body Dressing : Sitting;Set up   Lower Body Dressing: Min guard;Sit to/from stand   Toilet Transfer: Contractor- Water quality scientist and Hygiene: Min guard;Sit to/from stand       Functional mobility during ADLs: Min guard General ADL Comments: Min guard for safety as he was feeling weak and SpO2 drops     Vision         Perception     Praxis      Pertinent Vitals/Pain Pain Assessment: No/denies pain     Hand Dominance Right   Extremity/Trunk Assessment Upper Extremity Assessment Upper Extremity Assessment: Generalized weakness           Communication Communication Communication: No difficulties   Cognition Arousal/Alertness: Awake/alert Behavior During Therapy: WFL for tasks assessed/performed Overall Cognitive Status: Within Functional Limits for tasks assessed                                     General Comments  Needs reminders to slow down and not push  himself so much    Exercises Exercises: General Upper Extremity General Exercises - Upper Extremity Shoulder Flexion: AROM;10 reps;Theraband Theraband Level (Shoulder Flexion): Level 2 (Red) Shoulder ABduction: AROM;10 reps;Theraband Theraband Level (Shoulder Abduction): Level 2 (Red)   Shoulder Instructions      Home Living Family/patient expects to be discharged to:: Private residence Living Arrangements: Spouse/significant other Available Help at Discharge: Family Type of Home: House Home Access: Stairs to enter Technical brewer of Steps: 7 Entrance Stairs-Rails: Can reach both Home Layout: One level     Bathroom Shower/Tub: Teacher, early years/pre: Standard Bathroom Accessibility: Yes   Home Equipment: Grab bars - tub/shower          Prior Functioning/Environment  Level of Independence: Independent                 OT Problem List: Decreased strength;Decreased activity tolerance;Impaired balance (sitting and/or standing);Decreased safety awareness;Cardiopulmonary status limiting activity      OT Treatment/Interventions: Self-care/ADL training;Therapeutic exercise;Energy conservation;Therapeutic activities;Balance training    OT Goals(Current goals can be found in the care plan section) Acute Rehab OT Goals Patient Stated Goal: Get his strength back up OT Goal Formulation: With patient Time For Goal Achievement: 01/10/20 Potential to Achieve Goals: Good  OT Frequency: Min 3X/week   Barriers to D/C:            Co-evaluation              AM-PAC OT "6 Clicks" Daily Activity     Outcome Measure Help from another person eating meals?: None Help from another person taking care of personal grooming?: A Little Help from another person toileting, which includes using toliet, bedpan, or urinal?: A Little Help from another person bathing (including washing, rinsing, drying)?: A Little Help from another person to put on and taking off regular upper body clothing?: A Little Help from another person to put on and taking off regular lower body clothing?: A Little 6 Click Score: 19   End of Session Equipment Utilized During Treatment: Oxygen Nurse Communication: Mobility status  Activity Tolerance: Patient limited by fatigue Patient left: in chair;with call bell/phone within reach  OT Visit Diagnosis: Unsteadiness on feet (R26.81);Muscle weakness (generalized) (M62.81)                Time: 3491-7915 OT Time Calculation (min): 54 min Charges:  OT General Charges $OT Visit: 1 Visit OT Evaluation $OT Eval Moderate Complexity: 1 Mod OT Treatments $Self Care/Home Management : 23-37 mins $Therapeutic Activity: 8-22 mins  Duane Santos, OTR/L   Duane Santos 12/27/2019, 1:32 PM

## 2019-12-27 NOTE — Progress Notes (Signed)
Wife called and updated. All questions answered. No other questions at this time.

## 2019-12-27 NOTE — Progress Notes (Signed)
PROGRESS NOTE                                                                                                                                                                                                             Patient Demographics:    Duane Santos, is a 74 y.o. male, DOB - [birth year], VQM:[id number]  Admit date - 12/21/2019   Admitting Physician Bonnielee Haff, MD  Outpatient Primary MD for the patient is [place] [facility]   No chief complaint on file.      Brief Narrative    74 y.o. male with medical history significant of myasthenia gravis on prednisone, mixed hyperlipidemia,reports being Covid positive on January 4 has been having persistent fever and shortness of breath.  He feels more short of breath today and his wife decided to call EMS.  Patient was hypoxic in ED, he was transferred to [facility] 1/12 for further management.   Subjective:    Duane Santos today reports dyspnea mainly on exertion , reports some lightheadedness, and generalized weakness when ambulating inside his room yesterday .   Assessment  & Plan :    Principal Problem:   Pneumonia due to COVID-19 virus Active Problems:   Acute respiratory failure with hypoxia (HCC)   Myasthenia gravis (HCC)   Normocytic anemia   Osteoporosis  Acute hypoxic respiratory failure due to COVID-19 pneumonia -Patient with significant oxygen requirement on presentation, on 15 L high flow nasal cannula, has been improving slowly, he is tolerating 4 L nasal cannula at rest for last 48 hours, but still becomes significantly tachypneic, tachycardic and hypoxic with minimal activity . -Was encouraged use incentive spirometry, flutter valve, to prone. -Continue with IV steroids.  On increased dose of dexamethasone. -Treated with IV remdesivir. -Received convalescent plasma 1/12 -Received Actemra 1/13. -Continue to trend inflammatory markers closely.  D-dimers and CRP trending  down . -Treated with IV Rocephin and azithromycin, will discontinue azithromycin as discussed with primary neurologist at [facility]   COVID-19 Labs  Recent Labs    12/25/19 1122 12/26/19 1000 12/27/19 0950  DDIMER >20.00* 18.08* 13.30*  FERRITIN 1,139* 1,160*  --   CRP 4.0* 2.3*  --     No results found for: SARSCOV2NAA  Acute left  DVT/PE -Patient was significantly elevated D-dimers -Most likely in the setting of COVID-19,  venous Doppler significant for acute left DVT, as well his CTA chest significant for PE . -Initially on Lovenox, transition to Eliquis.  myasthenia gravis  -P.o. prednisone and CellCept at home, following with [facility] neurology -Oral prednisone on hold as patient on IV hydrocortisone,  -Continue  to hold CellCept for now. -Discussed with primary neurologist at [facility] Dr Westley Foots ,[name] . -Continue to monitor NIF, so for his NIF has been stable at  -63  Spiculated lung mass -Highly suspicious malignancy, especially with some sclerotic lesion in the ribs, point patient is unstable for any work-up, is arrange for outpatient follow-up with oncology clinic, patient to follow with Dr.Corcoran, at [place] at 10:30 AM on 01/06/2020  Herpes labialis -We will keep on Valtrex 1 g p.o. daily, continue with Cyclovir ointment  Osteoporosis -Continue with calcium and vitamin D, resume Fosamax as an outpatient. -Patient having spinal compression fracture, most likely in the setting of osteoporosis.   Code Status : DNR  Family Communication  : D/W wife via phone  1/16,D/W  with patient friend/family Dr. Parke Poisson [phone] per patient's request 12/27/2019  Disposition Plan  : Home  Consults  :  none  DVT Prophylaxis  : [place] lovenox  Lab Results  Component Value Date   PLT 344 12/27/2019    Antibiotics  :    Anti-infectives (From admission, onward)   Start     Dose/Rate Route Frequency Ordered Stop   12/26/19 1230  valACYclovir (VALTREX)  tablet 2,000 mg     2,000 mg Oral  Once 12/26/19 1223 12/26/19 1256   12/23/19 2200  azithromycin (ZITHROMAX) tablet 500 mg  Status:  Discontinued     500 mg Oral  Once 12/23/19 2003 12/23/19 2020   12/23/19 2030  azithromycin (ZITHROMAX) tablet 500 mg     500 mg Oral  Once 12/23/19 2024 12/23/19 2106   12/22/19 1000  remdesivir 100 mg in sodium chloride 0.9 % 100 mL IVPB     100 mg 200 mL/hr over 30 Minutes Intravenous Daily 12/21/19 1758 12/24/19 1240   12/21/19 1915  cefTRIAXone (ROCEPHIN) 1 g in sodium chloride 0.9 % 100 mL IVPB     1 g 200 mL/hr over 30 Minutes Intravenous Every 24 hours 12/21/19 1819 12/25/19 2113   12/21/19 1845  azithromycin (ZITHROMAX) 500 mg in sodium chloride 0.9 % 250 mL IVPB  Status:  Discontinued     500 mg 250 mL/hr over 60 Minutes Intravenous Every 24 hours 12/21/19 1819 12/24/19 1558        Objective:   Vitals:   12/27/19 0410 12/27/19 0745 12/27/19 1145 12/27/19 1524  BP:  102/87 127/76 122/75  Pulse: 95 (!) 111 (!) 102 (!) 102  Resp: 20 17 19  (!) 28  Temp:  98.2 F (36.8 C) 98 F (36.7 C) 98.2 F (36.8 C)  TempSrc:  Oral Oral Oral  SpO2: 93% 98% 98% 94%  Weight:      Height:        Wt Readings from Last 3 Encounters:  12/21/19 57.1 kg  12/20/19 56.7 kg     Intake/Output Summary (Last 24 hours) at 12/27/2019 1613 Last data filed at 12/27/2019 1400 Gross per 24 hour  Intake 480 ml  Output 1900 ml  Net -1420 ml     Physical Exam  Awake Alert, Oriented X 3, frail, No new F.N deficits, Normal affect Symmetrical Chest wall movement, Good air movement bilaterally, CTAB RRR,No Gallops,Rubs or new Murmurs, No Parasternal  Heave +ve B.Sounds, Abd Soft, No tenderness, No rebound - guarding or rigidity. No Cyanosis, Clubbing or edema, No new Rash or bruise      Data Review:    CBC Recent Labs  Lab 12/22/19 0520 12/22/19 0520 12/23/19 0308 12/24/19 0030 12/25/19 1122 12/26/19 1000 12/27/19 0950  WBC 10.5   < > 11.2* 15.3*  19.3* 19.1* 19.9*  HGB 11.6*   < > 11.5* 12.2* 14.1 15.4 15.2  HCT 34.1*   < > 33.8* 35.9* 40.8 45.6 44.3  PLT 226   < > 192 224 312 349 344  MCV 97.4   < > 96.0 96.2 95.6 96.8 96.9  MCH 33.1   < > 32.7 32.7 33.0 32.7 33.3  MCHC 34.0   < > 34.0 34.0 34.6 33.8 34.3  RDW 14.4   < > 14.3 14.1 14.3 14.5 14.8  LYMPHSABS 0.2*  --  0.2* 0.3* 0.3* 0.5*  --   MONOABS 0.6  --  0.4 0.6 0.4 0.2  --   EOSABS 0.0  --  0.0 0.0 0.0 0.1  --   BASOSABS 0.0  --  0.1 0.1 0.2* 0.2*  --    < > = values in this interval not displayed.    Chemistries  Recent Labs  Lab 12/22/19 0520 12/22/19 0520 12/23/19 0308 12/24/19 0030 12/25/19 1122 12/26/19 1000 12/27/19 0950  NA 134*   < > 135 137 134* 136 135  K 4.9   < > 4.7 4.1 4.4 4.9 4.1  CL 102   < > 105 104 101 97* 98  CO2 23   < > 24 22 22 26 26   GLUCOSE 128*   < > 129* 168* 143* 145* 108*  BUN 31*   < > 29* 33* 28* 32* 35*  CREATININE 0.87   < > 0.67 0.75 0.88 1.10 1.09  CALCIUM 8.6*   < > 8.8* 8.7* 9.4 9.9 11.3*  MG 2.3  --  2.2 2.2 2.1 2.3  --   AST 42*   < > 44* 74* 92* 90* 77*  ALT 30   < > 35 66* 110* 121* 126*  ALKPHOS 76   < > 83 93 113 124 122  BILITOT 0.7   < > 0.8 0.3 0.7 1.0 1.1   < > = values in this interval not displayed.   ------------------------------------------------------------------------------------------------------------------ No results for input(s): CHOL, HDL, LDLCALC, TRIG, CHOLHDL, LDLDIRECT in the last 72 hours.  No results found for: HGBA1C ------------------------------------------------------------------------------------------------------------------ No results for input(s): TSH, T4TOTAL, T3FREE, THYROIDAB in the last 72 hours.  Invalid input(s): FREET3 ------------------------------------------------------------------------------------------------------------------ Recent Labs    12/25/19 1122 12/26/19 1000  FERRITIN 1,139* 1,160*    Coagulation profile No results for input(s): INR, PROTIME in the last  168 hours.  Recent Labs    12/26/19 1000 12/27/19 0950  DDIMER 18.08* 13.30*    Cardiac Enzymes No results for input(s): CKMB, TROPONINI, MYOGLOBIN in the last 168 hours.  Invalid input(s): CK ------------------------------------------------------------------------------------------------------------------ No results found for: BNP  Inpatient Medications  Scheduled Meds: . acyclovir ointment   Topical Q3H  . albuterol  2 puff Inhalation Q6H  . apixaban  10 mg Oral BID   Followed by  . [START ON 01/02/2020] apixaban  5 mg Oral BID  . vitamin C  500 mg Oral Daily  . aspirin  81 mg Oral Daily  . calcium-vitamin D  1 tablet Oral TID WC  . dexamethasone (DECADRON) injection  6 mg Intravenous Daily  .  docusate sodium  100 mg Oral BID  . famotidine  20 mg Oral Daily  . folic acid  1 mg Oral Daily  . multivitamin with minerals  1 tablet Oral QODAY  . thiamine  100 mg Oral Daily  . zinc sulfate  220 mg Oral Daily   Continuous Infusions:  PRN Meds:.acetaminophen, chlorpheniramine-HYDROcodone, guaiFENesin-dextromethorphan, lip balm, Melatonin, ondansetron **OR** ondansetron (ZOFRAN) IV, phenol, sodium chloride  Micro Results Recent Results (from the past 240 hour(s))  Blood Culture (routine x 2)     Status: None   Collection Time: 12/20/19 12:59 PM   Specimen: BLOOD  Result Value Ref Range Status   Specimen Description BLOOD RIGHT AC  Final   Special Requests   Final    BOTTLES DRAWN AEROBIC AND ANAEROBIC Blood Culture results may not be optimal due to an excessive volume of blood received in culture bottles   Culture   Final    NO GROWTH 5 DAYS Performed at [facility], [address], [place], [place] [address]    Report Status 12/25/2019 FINAL  Final  Blood Culture (routine x 2)     Status: None   Collection Time: 12/20/19  1:00 PM   Specimen: BLOOD  Result Value Ref Range Status   Specimen Description BLOOD BLOOD RIGHT HAND  Final   Special Requests    Final    BOTTLES DRAWN AEROBIC AND ANAEROBIC Blood Culture adequate volume   Culture   Final    NO GROWTH 5 DAYS Performed at [facility], [address] [place], [place] [address]    Report Status 12/25/2019 FINAL  Final  Urine culture     Status: None   Collection Time: 12/20/19  1:00 PM   Specimen: In/Out Cath Urine  Result Value Ref Range Status   Specimen Description   Final    IN/OUT CATH URINE Performed at [facility], [address] [place], [place] [address]    Special Requests   Final    NONE Performed at [facility], [address], [place], [place] [address]    Culture   Final    NO GROWTH Performed at [facility], [place] [address], [place],  [address]    Report Status 12/21/2019 FINAL  Final    Radiology Reports CT ANGIO CHEST PE W OR WO CONTRAST  Result Date: 12/23/2019 CLINICAL DATA:  Shortness of breath. Covid 19. hypoxic, D Dimer >20 EXAM: CT ANGIOGRAPHY CHEST WITH CONTRAST TECHNIQUE: Multidetector CT imaging of the chest was performed using the standard protocol during bolus administration of intravenous contrast. Multiplanar CT image reconstructions and MIPs were obtained to evaluate the vascular anatomy. CONTRAST:  12mL OMNIPAQUE IOHEXOL 350 MG/ML SOLN COMPARISON:  12/22/2019 chest x-ray FINDINGS: Cardiovascular: There is atherosclerotic calcification of the coronary vessels and thoracic aorta. No aneurysm. The pulmonary artery to the superior segment of the RIGHT LOWER lobe is occluded with thrombus. Focal thrombus is identified within the RIGHT UPPER lobe pulmonary artery branch. Small subsegmental pulmonary emboli would be difficult to exclude due to technique. Mediastinum/Nodes: Small hiatal hernia. No significant mediastinal, hilar, or axillary adenopathy. The visualized portion of the thyroid gland has a normal appearance. Lungs/Pleura: Spiculated solid mass is identified within the superior segment of the RIGHT  LOWER lobe and measures 2.3 x 2.1 centimeters. LEFT UPPER lobe pulmonary nodule is 4 millimeters on image 44 of series 6. There is diffuse bilateral septal wall thickening. Opacities relatively spare the lung apices and superior  segments of the LOWER lobes. Some confluent opacities are identified posteriorly. No pleural effusions. Architectural distortion identified at both lung bases, LEFT greater than RIGHT. Upper Abdomen: No acute abnormality. Musculoskeletal: There is sclerosis not associated with deformity of the RIGHT third, fourth, and 5th ribs. Deformity of LEFT third and fourth ribs consistent prior fractures. Compression fractures of T3, T5, T6, and T12. Review of the MIP images confirms the above findings. IMPRESSION: 1. Study is positive for pulmonary emboli involving the superior segment RIGHT LOWER pulmonary artery and RIGHT UPPER lobe pulmonary artery. 2. Spiculated solid mass within the superior segment of the RIGHT LOWER lobe, suspicious for malignancy. 3. A 4 mm LEFT UPPER lobe pulmonary nodule. 4. Bilateral septal wall thickening and confluent interstitial opacities throughout the lungs. 5. Architectural distortion at both lung bases, LEFT greater than RIGHT. 6. Sclerotic changes of the RIGHT third, fourth, and 5th ribs, suspicious for metastatic disease. 7. Small hiatal hernia. 8. Coronary artery disease. 9. Aortic Atherosclerosis (ICD10-I70.0). 10. Numerous thoracic compression fractures. These results were called by telephone at the time of interpretation on 12/23/2019 at 9:36 am to provider Shyanna Klingel [facility] , who verbally acknowledged these results. Electronically Signed   By: Nolon Nations M.D.   On: 12/23/2019 09:37   PCXR COVID AM  Result Date: 12/22/2019 CLINICAL DATA:  COVID-19 positive. EXAM: PORTABLE CHEST 1 VIEW COMPARISON:  December 20, 2019. FINDINGS: The heart size and mediastinal contours are within normal limits. No pneumothorax pleural effusion is noted. Old left rib  fractures are noted. Mild interstitial densities are noted throughout both lungs suggesting atypical infection. IMPRESSION: Mild interstitial densities are noted throughout both lungs suggesting atypical infection. Followup radiographs are recommended until resolution. No pneumothorax is noted. Electronically Signed   By: Marijo Conception M.D.   On: 12/22/2019 09:11   DG Chest Port 1 View  Result Date: 12/20/2019 CLINICAL DATA:  COVID-19 positive with fever EXAM: PORTABLE CHEST 1 VIEW COMPARISON:  None. FINDINGS: There is ill-defined airspace opacity throughout portions of each upper lobe as well as to a lesser degree in the right lower lung region. No consolidation. Heart size and pulmonary vascularity are normal. No adenopathy. There old healed rib fractures on the left with remodeling. There is mild thoracolumbar levoscoliosis. No pneumothorax. IMPRESSION: Ill-defined airspace opacity bilaterally, more on the right than on the left, without consolidation. Suspect atypical organism multifocal pneumonia. Cardiac silhouette normal.  No adenopathy. Electronically Signed   By: Lowella Grip III M.D.   On: 12/20/2019 12:33   VAS [place] LOWER EXTREMITY VENOUS (DVT)  Result Date: 12/24/2019  Lower Venous Study Indications: Elevated d-dimer. COVID positive.  Comparison Study: No prior study. Performing Technologist: Maudry Mayhew MHA, RDMS, RVT, RDCS  Examination Guidelines: A complete evaluation includes B-mode imaging, spectral Doppler, color Doppler, and power Doppler as needed of all accessible portions of each vessel. Bilateral testing is considered an integral part of a complete examination. Limited examinations for reoccurring indications may be performed as noted.  +---------+---------------+---------+-----------+----------+--------------+ RIGHT    CompressibilityPhasicitySpontaneityPropertiesThrombus Aging +---------+---------------+---------+-----------+----------+--------------+ CFV       Full           Yes      Yes                                 +---------+---------------+---------+-----------+----------+--------------+ SFJ      Full                                                        +---------+---------------+---------+-----------+----------+--------------+  FV Prox  Full                                                        +---------+---------------+---------+-----------+----------+--------------+ FV Mid   Full                                                        +---------+---------------+---------+-----------+----------+--------------+ FV DistalFull                                                        +---------+---------------+---------+-----------+----------+--------------+ PFV      Full                                                        +---------+---------------+---------+-----------+----------+--------------+ POP      Full           Yes      Yes                                 +---------+---------------+---------+-----------+----------+--------------+ PTV      Full                                                        +---------+---------------+---------+-----------+----------+--------------+ PERO     Full                                                        +---------+---------------+---------+-----------+----------+--------------+   +---------+---------------+---------+-----------+----------+--------------+ LEFT     CompressibilityPhasicitySpontaneityPropertiesThrombus Aging +---------+---------------+---------+-----------+----------+--------------+ CFV      Full           Yes      Yes                                 +---------+---------------+---------+-----------+----------+--------------+ SFJ      Full                                                        +---------+---------------+---------+-----------+----------+--------------+ FV Prox  Full                                                         +---------+---------------+---------+-----------+----------+--------------+  FV Mid   Full                                                        +---------+---------------+---------+-----------+----------+--------------+ FV DistalFull                                                        +---------+---------------+---------+-----------+----------+--------------+ PFV      Full                                                        +---------+---------------+---------+-----------+----------+--------------+ POP      Full           Yes      Yes                                 +---------+---------------+---------+-----------+----------+--------------+ PTV      None                    No                   Acute          +---------+---------------+---------+-----------+----------+--------------+ PERO     Full                                                        +---------+---------------+---------+-----------+----------+--------------+     Summary: Right: There is no evidence of deep vein thrombosis in the lower extremity. No cystic structure found in the popliteal fossa. Left: Findings consistent with acute deep vein thrombosis involving the left posterior tibial veins. No cystic structure found in the popliteal fossa.  *See table(s) above for measurements and observations. Electronically signed by Monica Martinez MD on 12/24/2019 at 3:33:59 PM.    Final      Phillips Climes M.D on 12/27/2019 at 4:13 PM   [email or web address]   [facility] -  Office  [phone]

## 2019-12-27 NOTE — Progress Notes (Signed)
NIF- >-40cm H20 on 3 good attempts IS- >2268ml x 3 good attempts

## 2019-12-28 LAB — COMPREHENSIVE METABOLIC PANEL
ALT: 106 U/L — ABNORMAL HIGH (ref 0–44)
AST: 59 U/L — ABNORMAL HIGH (ref 15–41)
Albumin: 3.3 g/dL — ABNORMAL LOW (ref 3.5–5.0)
Alkaline Phosphatase: 120 U/L (ref 38–126)
Anion gap: 11 (ref 5–15)
BUN: 39 mg/dL — ABNORMAL HIGH (ref 8–23)
CO2: 27 mmol/L (ref 22–32)
Calcium: 10.6 mg/dL — ABNORMAL HIGH (ref 8.9–10.3)
Chloride: 97 mmol/L — ABNORMAL LOW (ref 98–111)
Creatinine, Ser: 1.18 mg/dL (ref 0.61–1.24)
GFR calc Af Amer: >60 mL/min (ref >60)
GFR calc non Af Amer: >60 mL/min (ref >60)
Glucose, Bld: 139 mg/dL — ABNORMAL HIGH (ref 70–99)
Potassium: 4.5 mmol/L (ref 3.5–5.1)
Sodium: 135 mmol/L (ref 135–145)
Total Bilirubin: 1.4 mg/dL — ABNORMAL HIGH (ref 0.3–1.2)
Total Protein: 6.5 g/dL (ref 6.5–8.1)

## 2019-12-28 LAB — CBC
HCT: 45.8 % (ref 39.0–52.0)
Hemoglobin: 15.5 g/dL (ref 13.0–17.0)
MCH: 32.8 pg (ref 26.0–34.0)
MCHC: 33.8 g/dL (ref 30.0–36.0)
MCV: 97 fL (ref 80.0–100.0)
Platelets: 351 10*3/uL (ref 150–400)
RBC: 4.72 MIL/uL (ref 4.22–5.81)
RDW: 15.1 % (ref 11.5–15.5)
WBC: 19.3 10*3/uL — ABNORMAL HIGH (ref 4.0–10.5)
nRBC: 0 % (ref 0.0–0.2)

## 2019-12-28 LAB — D-DIMER, QUANTITATIVE: D-Dimer, Quant: 17.33 ug/mL-FEU — ABNORMAL HIGH (ref 0.00–0.50)

## 2019-12-28 MED ORDER — GLUCERNA SHAKE PO LIQD
237.0000 mL | Freq: Four times a day (QID) | ORAL | Status: DC
  Administered 2019-12-28 – 2020-01-03 (×21): 237 mL via ORAL

## 2019-12-28 NOTE — Plan of Care (Signed)
Called and spoke with wife to give update and discuss current POC, status and progress.  No further questions at this time.  Wife also asked  To speak with Dr - text to inform.

## 2019-12-28 NOTE — Progress Notes (Signed)
Ambulation Note  Saturation Pre: 99% on 4L  Ambulation Distance: 120 ft  Saturation During Ambulation: 82-91% on 4L  Notes: Walked independently. Pt's work of breathing noticeably elevated. Pt's sats returned to the 90s within a minute of sitting in the recliner. Pt returned to recliner with call bell within reach, sats were 96% on 4L.  Philis Kendall, MS, ACSM CEP 10:54 AM 12/28/2019

## 2019-12-28 NOTE — Progress Notes (Signed)
NIF was >-40 and IS was around 1700. The patient had good effort and shows no distress at this time.

## 2019-12-28 NOTE — Progress Notes (Signed)
PROGRESS NOTE                                                                                                                                                                                                             Patient Demographics:    Duane Santos, is a 74 y.o. male, DOB - 1946-12-02, CHE:[id number]  Admit date - 12/21/2019   Admitting Physician Duane Haff, MD  Outpatient Primary MD for the patient is Duane Santos   No chief complaint on file.      Brief Narrative    74 y.o. male with medical history significant of myasthenia gravis on prednisone, mixed hyperlipidemia,reports being Covid positive on January 4 has been having persistent fever and shortness of breath.  He feels more short of breath today and his wife decided to call EMS.  Patient was hypoxic in ED, he was transferred to [facility] 1/12 for further management, patient on 15 L high flow nasal cannula presentation, he did receive Actemra, plasma, treated with steroids and remdesivir, he did have gradual improvement of his oxygen requirement, and with known history of myasthenia gravis, he maintained good NIF during hospital stay, patient CTA chest was significant for PE, and right lower lobe spiculated mass, as well work-up significant for acute DVT, initially on Lovenox, transition to Eliquis.   Subjective:    Duane Santos today was able to tolerate some activity with PT, and staff, ports dyspnea, mainly exertional, as well for some cough .   Assessment  & Plan :    Principal Problem:   Pneumonia due to COVID-19 virus Active Problems:   Acute respiratory failure with hypoxia (HCC)   Myasthenia gravis (HCC)   Normocytic anemia   Osteoporosis  Acute hypoxic respiratory failure due to COVID-19 pneumonia -Patient with significant oxygen requirement on presentation, on 15 L high flow nasal cannula, has been improving slowly, he has been tolerating 4 L high flow  nasal cannula for last 3 days, as well he was able to tolerate activity on 4 L nasal cannula . -Was encouraged use incentive spirometry, flutter valve,and  to prone. -Continue with IV steroids.  Currently on Decadron 6 mg IV daily. -Treated with IV remdesivir. -Received convalescent plasma 1/12 -Received Actemra 1/13. -Continue to trend inflammatory markers closely.   -Treated with IV Rocephin and azithromycin,  will discontinue azithromycin as discussed with primary neurologist at [facility].   COVID-19 Labs  Recent Labs    12/26/19 1000 12/27/19 0950 12/28/19 1040  DDIMER 18.08* 13.30* 17.33*  FERRITIN 1,160*  --   --   CRP 2.3*  --   --     No results found for: SARSCOV2NAA  Acute left  DVT/PE -Patient was significantly elevated D-dimers -Most likely in the setting of COVID-19, venous Doppler significant for acute left DVT, as well his CTA chest significant for PE . -Initially on Lovenox, transition to Eliquis.  myasthenia gravis  -P.o. prednisone and CellCept at home, following with [facility] neurology -Oral prednisone on hold as patient on IV hydrocortisone,  -Continue  to hold CellCept for now, will discusse with his primary neurologist at [facility] Dr Westley Santos ,[name] , about recommendation to taper his steroids as an outpatient.  And when to resume CellCept -Continue to monitor NIF daily, so for his NIF has been stable at  -55  Spiculated lung mass -Highly suspicious malignancy, especially with some sclerotic lesion in the ribs, point patient is unstable for any work-up, is arrange for outpatient follow-up with oncology clinic, patient to follow with Duane Santos, at [place] at 10:30 AM on 01/06/2020  Herpes labialis -received Valtrex, on Cyclovir ointment  Osteoporosis -Continue with calcium and vitamin D, resume Fosamax as an outpatient. -Patient having spinal compression fracture, most likely in the setting of osteoporosis.   Code Status : DNR  Family  Communication  : D/W wife via phone  1/19. -D/W  with patient friend/family Dr. Parke Poisson [phone] per patient's request 12/27/2019  Disposition Plan  : Home  Consults  :  none  DVT Prophylaxis  : [place] lovenox  Lab Results  Component Value Date   PLT 351 12/28/2019    Antibiotics  :    Anti-infectives (From admission, onward)   Start     Dose/Rate Route Frequency Ordered Stop   12/27/19 1700  valACYclovir (VALTREX) tablet 1,000 mg     1,000 mg Oral Daily 12/27/19 1615 12/30/19 0959   12/26/19 1230  valACYclovir (VALTREX) tablet 2,000 mg     2,000 mg Oral  Once 12/26/19 1223 12/26/19 1256   12/23/19 2200  azithromycin (ZITHROMAX) tablet 500 mg  Status:  Discontinued     500 mg Oral  Once 12/23/19 2003 12/23/19 2020   12/23/19 2030  azithromycin (ZITHROMAX) tablet 500 mg     500 mg Oral  Once 12/23/19 2024 12/23/19 2106   12/22/19 1000  remdesivir 100 mg in sodium chloride 0.9 % 100 mL IVPB     100 mg 200 mL/hr over 30 Minutes Intravenous Daily 12/21/19 1758 12/24/19 1240   12/21/19 1915  cefTRIAXone (ROCEPHIN) 1 g in sodium chloride 0.9 % 100 mL IVPB     1 g 200 mL/hr over 30 Minutes Intravenous Every 24 hours 12/21/19 1819 12/25/19 2113   12/21/19 1845  azithromycin (ZITHROMAX) 500 mg in sodium chloride 0.9 % 250 mL IVPB  Status:  Discontinued     500 mg 250 mL/hr over 60 Minutes Intravenous Every 24 hours 12/21/19 1819 12/24/19 1558        Objective:   Vitals:   12/27/19 2011 12/28/19 0035 12/28/19 0518 12/28/19 1642  BP: 134/68 140/80 112/79   Pulse: 88 97 99   Resp: (!) 24 18 (!) 21   Temp: 98.2 F (36.8 C) 98.2 F (36.8 C) 98.6 F (37 C)   TempSrc: Oral Oral Oral  SpO2: 96% 90% 94% 94%  Weight:      Height:        Wt Readings from Last 3 Encounters:  12/21/19 57.1 kg  12/20/19 56.Santos kg     Intake/Output Summary (Last 24 hours) at 12/28/2019 1658 Last data filed at 12/28/2019 0555 Gross per 24 hour  Intake --  Output 1400 ml  Net -1400 ml      Physical Exam  Awake Alert, Oriented X 3, No new F.N deficits, Normal affect Symmetrical Chest wall movement, Good air movement bilaterally, CTAB RRR,No Gallops,Rubs or new Murmurs, No Parasternal Heave +ve B.Sounds, Abd Soft, No tenderness, No rebound - guarding or rigidity. No Cyanosis, Clubbing or edema, No new Rash or bruise       Data Review:    CBC Recent Labs  Lab 12/22/19 0520 12/22/19 0520 12/23/19 0308 12/23/19 0308 12/24/19 0030 12/25/19 1122 12/26/19 1000 12/27/19 0950 12/28/19 1040  WBC 10.5   < > 11.2*   < > 15.3* 19.3* 19.1* 19.9* 19.3*  HGB 11.6*   < > 11.5*   < > 12.2* 14.1 15.4 15.2 15.5  HCT 34.1*   < > 33.8*   < > 35.9* 40.8 45.6 44.3 45.8  PLT 226   < > 192   < > 224 312 349 344 351  MCV 97.4   < > 96.0   < > 96.2 95.6 96.8 96.9 97.0  MCH 33.1   < > 32.Santos   < > 32.Santos 33.0 32.Santos 33.3 32.8  MCHC 34.0   < > 34.0   < > 34.0 34.6 33.8 34.3 33.8  RDW 14.4   < > 14.3   < > 14.1 14.3 14.5 14.8 15.1  LYMPHSABS 0.2*  --  0.2*  --  0.3* 0.3* 0.5*  --   --   MONOABS 0.6  --  0.4  --  0.6 0.4 0.2  --   --   EOSABS 0.0  --  0.0  --  0.0 0.0 0.1  --   --   BASOSABS 0.0  --  0.1  --  0.1 0.2* 0.2*  --   --    < > = values in this interval not displayed.    Chemistries  Recent Labs  Lab 12/22/19 0520 12/22/19 0520 12/23/19 0308 12/23/19 0308 12/24/19 0030 12/25/19 1122 12/26/19 1000 12/27/19 0950 12/28/19 1040  NA 134*   < > 135   < > 137 134* 136 135 135  K 4.9   < > 4.Santos   < > 4.1 4.4 4.9 4.1 4.5  CL 102   < > 105   < > 104 101 97* 98 97*  CO2 23   < > 24   < > 22 22 26 26 27   GLUCOSE 128*   < > 129*   < > 168* 143* 145* 108* 139*  BUN 31*   < > 29*   < > 33* 28* 32* 35* 39*  CREATININE 0.87   < > 0.67   < > 0.75 0.88 1.10 1.09 1.18  CALCIUM 8.6*   < > 8.8*   < > 8.Santos* 9.4 9.9 11.3* 10.6*  MG 2.3  --  2.2  --  2.2 2.1 2.3  --   --   AST 42*   < > 44*   < > 74* 92* 90* 77* 59*  ALT 30   < > 35   < > 66* 110* 121* 126* 106*  ALKPHOS  76   < > 83    < > 93 113 124 122 120  BILITOT 0.Santos   < > 0.8   < > 0.3 0.Santos 1.0 1.1 1.4*   < > = values in this interval not displayed.   ------------------------------------------------------------------------------------------------------------------ No results for input(s): CHOL, HDL, LDLCALC, TRIG, CHOLHDL, LDLDIRECT in the last 72 hours.  No results found for: HGBA1C ------------------------------------------------------------------------------------------------------------------ No results for input(s): TSH, T4TOTAL, T3FREE, THYROIDAB in the last 72 hours.  Invalid input(s): FREET3 ------------------------------------------------------------------------------------------------------------------ Recent Labs    12/26/19 1000  FERRITIN 1,160*    Coagulation profile No results for input(s): INR, PROTIME in the last 168 hours.  Recent Labs    12/27/19 0950 12/28/19 1040  DDIMER 13.30* 17.33*    Cardiac Enzymes No results for input(s): CKMB, TROPONINI, MYOGLOBIN in the last 168 hours.  Invalid input(s): CK ------------------------------------------------------------------------------------------------------------------ No results found for: BNP  Inpatient Medications  Scheduled Meds: . acyclovir ointment   Topical Q3H  . albuterol  2 puff Inhalation Q6H  . apixaban  10 mg Oral BID   Followed by  . [START ON 01/02/2020] apixaban  5 mg Oral BID  . vitamin C  500 mg Oral Daily  . aspirin  81 mg Oral Daily  . calcium-vitamin D  1 tablet Oral TID WC  . dexamethasone (DECADRON) injection  6 mg Intravenous Daily  . docusate sodium  100 mg Oral BID  . famotidine  20 mg Oral Daily  . feeding supplement (GLUCERNA SHAKE)  237 mL Oral QID  . folic acid  1 mg Oral Daily  . multivitamin with minerals  1 tablet Oral QODAY  . thiamine  100 mg Oral Daily  . valACYclovir  1,000 mg Oral Daily  . zinc sulfate  220 mg Oral Daily   Continuous Infusions:  PRN Meds:.acetaminophen,  chlorpheniramine-HYDROcodone, guaiFENesin-dextromethorphan, lip balm, Melatonin, ondansetron **OR** ondansetron (ZOFRAN) IV, phenol, sodium chloride  Micro Results Recent Results (from the past 240 hour(s))  Blood Culture (routine x 2)     Status: None   Collection Time: 12/20/19 12:59 PM   Specimen: BLOOD  Result Value Ref Range Status   Specimen Description BLOOD RIGHT AC  Final   Special Requests   Final    BOTTLES DRAWN AEROBIC AND ANAEROBIC Blood Culture results may not be optimal due to an excessive volume of blood received in culture bottles   Culture   Final    NO GROWTH 5 DAYS Performed at [facility], [address] [place], [place] [address]    Report Status 12/25/2019 FINAL  Final  Blood Culture (routine x 2)     Status: None   Collection Time: 12/20/19  1:00 PM   Specimen: BLOOD  Result Value Ref Range Status   Specimen Description BLOOD BLOOD RIGHT HAND  Final   Special Requests   Final    BOTTLES DRAWN AEROBIC AND ANAEROBIC Blood Culture adequate volume   Culture   Final    NO GROWTH 5 DAYS Performed at [facility], [address] [place], [place] [address]    Report Status 12/25/2019 FINAL  Final  Urine culture     Status: None   Collection Time: 12/20/19  1:00 PM   Specimen: In/Out Cath Urine  Result Value Ref Range Status   Specimen Description   Final    IN/OUT CATH URINE Performed at [facility], [address] [place], [place] [address]    Special Requests   Final  NONE Performed at [facility], [address] [place], [place] [address]    Culture   Final    NO GROWTH Performed at [facility], [place] [address], [place], [place] [address]    Report Status 12/21/2019 FINAL  Final    Radiology Reports CT ANGIO CHEST PE W OR WO CONTRAST  Result Date: 12/23/2019 CLINICAL DATA:  Shortness of breath. Covid 19. hypoxic, D Dimer >20 EXAM: CT ANGIOGRAPHY CHEST WITH CONTRAST TECHNIQUE: Multidetector CT  imaging of the chest was performed using the standard protocol during bolus administration of intravenous contrast. Multiplanar CT image reconstructions and MIPs were obtained to evaluate the vascular anatomy. CONTRAST:  69mL OMNIPAQUE IOHEXOL 350 MG/ML SOLN COMPARISON:  12/22/2019 chest x-ray FINDINGS: Cardiovascular: There is atherosclerotic calcification of the coronary vessels and thoracic aorta. No aneurysm. The pulmonary artery to the superior segment of the RIGHT LOWER lobe is occluded with thrombus. Focal thrombus is identified within the RIGHT UPPER lobe pulmonary artery branch. Small subsegmental pulmonary emboli would be difficult to exclude due to technique. Mediastinum/Nodes: Small hiatal hernia. No significant mediastinal, hilar, or axillary adenopathy. The visualized portion of the thyroid gland has a normal appearance. Lungs/Pleura: Spiculated solid mass is identified within the superior segment of the RIGHT LOWER lobe and measures 2.3 x 2.1 centimeters. LEFT UPPER lobe pulmonary nodule is 4 millimeters on image 44 of series 6. There is diffuse bilateral septal wall thickening. Opacities relatively spare the lung apices and superior segments of the LOWER lobes. Some confluent opacities are identified posteriorly. No pleural effusions. Architectural distortion identified at both lung bases, LEFT greater than RIGHT. Upper Abdomen: No acute abnormality. Musculoskeletal: There is sclerosis not associated with deformity of the RIGHT third, fourth, and 5th ribs. Deformity of LEFT third and fourth ribs consistent prior fractures. Compression fractures of T3, T5, T6, and T12. Review of the MIP images confirms the above findings. IMPRESSION: 1. Study is positive for pulmonary emboli involving the superior segment RIGHT LOWER pulmonary artery and RIGHT UPPER lobe pulmonary artery. 2. Spiculated solid mass within the superior segment of the RIGHT LOWER lobe, suspicious for malignancy. 3. A 4 mm LEFT UPPER lobe  pulmonary nodule. 4. Bilateral septal wall thickening and confluent interstitial opacities throughout the lungs. 5. Architectural distortion at both lung bases, LEFT greater than RIGHT. 6. Sclerotic changes of the RIGHT third, fourth, and 5th ribs, suspicious for metastatic disease. Santos. Small hiatal hernia. 8. Coronary artery disease. 9. Aortic Atherosclerosis (ICD10-I70.0). 10. Numerous thoracic compression fractures. These results were called by telephone at the time of interpretation on 12/23/2019 at 9:36 am to provider Zaylen Susman [facility] , who verbally acknowledged these results. Electronically Signed   By: Nolon Nations M.D.   On: 12/23/2019 09:37   PCXR COVID AM  Result Date: 12/22/2019 CLINICAL DATA:  COVID-19 positive. EXAM: PORTABLE CHEST 1 VIEW COMPARISON:  December 20, 2019. FINDINGS: The heart size and mediastinal contours are within normal limits. No pneumothorax pleural effusion is noted. Old left rib fractures are noted. Mild interstitial densities are noted throughout both lungs suggesting atypical infection. IMPRESSION: Mild interstitial densities are noted throughout both lungs suggesting atypical infection. Followup radiographs are recommended until resolution. No pneumothorax is noted. Electronically Signed   By: Marijo Conception M.D.   On: 12/22/2019 09:11   DG Chest Port 1 View  Result Date: 12/20/2019 CLINICAL DATA:  COVID-19 positive with fever EXAM: PORTABLE CHEST 1 VIEW COMPARISON:  None. FINDINGS: There is ill-defined airspace opacity throughout portions  of each upper lobe as well as to a lesser degree in the right lower lung region. No consolidation. Heart size and pulmonary vascularity are normal. No adenopathy. There old healed rib fractures on the left with remodeling. There is mild thoracolumbar levoscoliosis. No pneumothorax. IMPRESSION: Ill-defined airspace opacity bilaterally, more on the right than on the left, without consolidation. Suspect atypical organism multifocal  pneumonia. Cardiac silhouette normal.  No adenopathy. Electronically Signed   By: Lowella Grip III M.D.   On: 12/20/2019 12:33   VAS [place] LOWER EXTREMITY VENOUS (DVT)  Result Date: 12/24/2019  Lower Venous Study Indications: Elevated d-dimer. COVID positive.  Comparison Study: No prior study. Performing Technologist: Maudry Mayhew MHA, RDMS, RVT, RDCS  Examination Guidelines: A complete evaluation includes B-mode imaging, spectral Doppler, color Doppler, and power Doppler as needed of all accessible portions of each vessel. Bilateral testing is considered an integral part of a complete examination. Limited examinations for reoccurring indications may be performed as noted.  +---------+---------------+---------+-----------+----------+--------------+ RIGHT    CompressibilityPhasicitySpontaneityPropertiesThrombus Aging +---------+---------------+---------+-----------+----------+--------------+ CFV      Full           Yes      Yes                                 +---------+---------------+---------+-----------+----------+--------------+ SFJ      Full                                                        +---------+---------------+---------+-----------+----------+--------------+ FV Prox  Full                                                        +---------+---------------+---------+-----------+----------+--------------+ FV Mid   Full                                                        +---------+---------------+---------+-----------+----------+--------------+ FV DistalFull                                                        +---------+---------------+---------+-----------+----------+--------------+ PFV      Full                                                        +---------+---------------+---------+-----------+----------+--------------+ POP      Full           Yes      Yes                                  +---------+---------------+---------+-----------+----------+--------------+ PTV  Full                                                        +---------+---------------+---------+-----------+----------+--------------+ PERO     Full                                                        +---------+---------------+---------+-----------+----------+--------------+   +---------+---------------+---------+-----------+----------+--------------+ LEFT     CompressibilityPhasicitySpontaneityPropertiesThrombus Aging +---------+---------------+---------+-----------+----------+--------------+ CFV      Full           Yes      Yes                                 +---------+---------------+---------+-----------+----------+--------------+ SFJ      Full                                                        +---------+---------------+---------+-----------+----------+--------------+ FV Prox  Full                                                        +---------+---------------+---------+-----------+----------+--------------+ FV Mid   Full                                                        +---------+---------------+---------+-----------+----------+--------------+ FV DistalFull                                                        +---------+---------------+---------+-----------+----------+--------------+ PFV      Full                                                        +---------+---------------+---------+-----------+----------+--------------+ POP      Full           Yes      Yes                                 +---------+---------------+---------+-----------+----------+--------------+ PTV      None                    No                   Acute          +---------+---------------+---------+-----------+----------+--------------+  PERO     Full                                                         +---------+---------------+---------+-----------+----------+--------------+     Summary: Right: There is no evidence of deep vein thrombosis in the lower extremity. No cystic structure found in the popliteal fossa. Left: Findings consistent with acute deep vein thrombosis involving the left posterior tibial veins. No cystic structure found in the popliteal fossa.  *See table(s) above for measurements and observations. Electronically signed by Monica Martinez MD on 12/24/2019 at 3:33:59 PM.    Final      Phillips Climes M.D on 12/28/2019 at 4:58 PM   [email or web address]   [facility] -  Office  [phone]

## 2019-12-28 NOTE — Progress Notes (Signed)
RT unavailable to perform NIF/VC with pt while awake.  At 12:31am, when RT arrived, pt was sleeping.  RT will continue exercises in the morning.

## 2019-12-29 LAB — COMPREHENSIVE METABOLIC PANEL
ALT: 80 U/L — ABNORMAL HIGH (ref 0–44)
AST: 48 U/L — ABNORMAL HIGH (ref 15–41)
Albumin: 3.3 g/dL — ABNORMAL LOW (ref 3.5–5.0)
Alkaline Phosphatase: 117 U/L (ref 38–126)
Anion gap: 13 (ref 5–15)
BUN: 39 mg/dL — ABNORMAL HIGH (ref 8–23)
CO2: 27 mmol/L (ref 22–32)
Calcium: 11.5 mg/dL — ABNORMAL HIGH (ref 8.9–10.3)
Chloride: 95 mmol/L — ABNORMAL LOW (ref 98–111)
Creatinine, Ser: 1.22 mg/dL (ref 0.61–1.24)
GFR calc Af Amer: >60 mL/min (ref >60)
GFR calc non Af Amer: 59 mL/min — ABNORMAL LOW (ref >60)
Glucose, Bld: 94 mg/dL (ref 70–99)
Potassium: 3.9 mmol/L (ref 3.5–5.1)
Sodium: 135 mmol/L (ref 135–145)
Total Bilirubin: 1.4 mg/dL — ABNORMAL HIGH (ref 0.3–1.2)
Total Protein: 6.3 g/dL — ABNORMAL LOW (ref 6.5–8.1)

## 2019-12-29 LAB — CBC
HCT: 44.8 % (ref 39.0–52.0)
Hemoglobin: 15 g/dL (ref 13.0–17.0)
MCH: 32.8 pg (ref 26.0–34.0)
MCHC: 33.5 g/dL (ref 30.0–36.0)
MCV: 98 fL (ref 80.0–100.0)
Platelets: 360 10*3/uL (ref 150–400)
RBC: 4.57 MIL/uL (ref 4.22–5.81)
RDW: 15.1 % (ref 11.5–15.5)
WBC: 15.4 10*3/uL — ABNORMAL HIGH (ref 4.0–10.5)
nRBC: 0 % (ref 0.0–0.2)

## 2019-12-29 LAB — D-DIMER, QUANTITATIVE: D-Dimer, Quant: 16.46 ug/mL-FEU — ABNORMAL HIGH (ref 0.00–0.50)

## 2019-12-29 NOTE — Progress Notes (Signed)
PROGRESS NOTE  Duane Santos EPP:[id number] DOB: [birth year] DOA: 12/21/2019 PCP: [facility]   LOS: 8 days   Brief Narrative / Interim history: 74 year old male with history of myasthenia gravis on prednisone, hyperlipidemia, tested positive for Covid in January 4 and has been having persistent fevers and shortness of breath, and his symptoms progressed and he was admitted to the hospital on 12/21/2019.  He was quite hypoxic initially requiring 15 L high flow nasal cannula.  He was treated with steroids, remdesivir, convalescent plasma as well as Actemra.  He underwent a CT angiogram on admission which was positive for PE, he was also diagnosed with acute DVT.  CT scan also showed a spiculated solid mass in the superior segment of the right lower lobe, suspicious for malignancy.  Subjective / 24h Interval events: He is feeling well today, still weak but generally much improved since admission.  Denies any chest pain.  Assessment & Plan:  Principal Problem Acute Hypoxic Respiratory Failure due to Covid-19 Viral Illness -Initially requiring 15 L high flow nasal cannula and now able to be weaned off and currently on 4 L nasal cannula.  He has been on this much oxygen for the last 3 days and able to tolerate activity -Continue IV steroids, currently on 6 mg of Decadron daily -Completed remdesivir -He is status post convalescent plasma on 1/12 and received Actemra on 1/13 -Continue to monitor inflammatory markers -Given prolonged hypoxia and overall stability on 4 L nasal cannula along with the presence of PE and bilateral interstitial opacities throughout the lung, along with underlying myasthenia gravis, it is possible that his hypoxia may be present for several weeks and patient is very likely that he will go home on oxygen  COVID-19 Labs  Recent Labs    12/27/19 0950 12/28/19 1040 12/29/19 1035  DDIMER 13.30* 17.33* 16.46*   Active Problems Acute left DVT/PE -With  significantly elevated D-dimer, this is due to COVID-19 -He was initially on Lovenox and has been transitioned to Eliquis, will need 10 mg twice daily for 7 days and on 1/24 he will be transitioned to 5 mg twice daily  Myasthenia gravis -Patient on prednisone and CellCept at home, oral prednisone is now on hold as patient is on intravenous steroids -Continue to hold CellCept for now, with close to discharge I will: Discussed with primary neurologist at [facility] about prednisone taper recommendations and when CellCept could be resumed -Continue to monitor NIFs daily  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 on 01/06/2020  Herpes labialis -Received Valtrex, on acyclovir ointment  Hypercalcemia/osteoporosis -Discontinue calcium supplements.  I wonder whether the spiculated mass is contributing   Scheduled Meds: . acyclovir ointment   Topical Q3H  . albuterol  2 puff Inhalation Q6H  . apixaban  10 mg Oral BID   Followed by  . [START ON 01/02/2020] apixaban  5 mg Oral BID  . vitamin C  500 mg Oral Daily  . aspirin  81 mg Oral Daily  . calcium-vitamin D  1 tablet Oral TID WC  . dexamethasone (DECADRON) injection  6 mg Intravenous Daily  . docusate sodium  100 mg Oral BID  . famotidine  20 mg Oral Daily  . feeding supplement (GLUCERNA SHAKE)  237 mL Oral QID  . folic acid  1 mg Oral Daily  . multivitamin with minerals  1 tablet Oral QODAY  . thiamine  100  mg Oral Daily  . valACYclovir  1,000 mg Oral Daily  . zinc sulfate  220 mg Oral Daily   Continuous Infusions: PRN Meds:.acetaminophen, chlorpheniramine-HYDROcodone, guaiFENesin-dextromethorphan, lip balm, Melatonin, ondansetron **OR** ondansetron (ZOFRAN) IV, phenol, sodium chloride  DVT prophylaxis: Eliquis Code Status: DNR Family Communication: d/w wife [name] [phone] Patient admitted from: home Anticipated d/c place:  home Barriers to d/c: Still hypoxic and weak, will work with physical therapy patient's goal is to walk about 50 feet to be able to navigate his home and once we achieve that he will be able to be discharged, anticipate on Friday and will need home health PT on discharge  Consultants:  None  Procedures:  none  Microbiology: none  Antimicrobials: none   Objective: Vitals:   12/28/19 1905 12/28/19 1946 12/29/19 0853 12/29/19 1208  BP:  130/75  116/68  Pulse: 95 78 98 (!) 102  Resp: 17 (!) 24 (!) 22 19  Temp:  98.7 F (37.1 C) (!) 97.5 F (36.4 C) 97.6 F (36.4 C)  TempSrc:  Oral Oral Oral  SpO2: 92% 90% 97% 99%  Weight:      Height:        Intake/Output Summary (Last 24 hours) at 12/29/2019 1608 Last data filed at 12/29/2019 1300 Gross per 24 hour  Intake 720 ml  Output 1350 ml  Net -630 ml   Filed Weights   12/21/19 2044  Weight: 57.1 kg    Examination:  Constitutional: NAD Eyes: no scleral icterus ENMT: Mucous membranes are moist.  Neck: normal, supple Respiratory: Diminished at the bases but overall clear without wheezing or crackles Cardiovascular: Regular rate and rhythm, no murmurs / rubs / gallops. No LE edema. Abdomen: non distended, no tenderness. Bowel sounds positive.  Musculoskeletal: no clubbing / cyanosis.  Skin: no rashes Neurologic: Nonfocal, equal strength, ambulatory   Data Reviewed: I have independently reviewed following labs and imaging studies   CBC: Recent Labs  Lab 12/23/19 0308 12/23/19 0308 12/24/19 0030 12/24/19 0030 12/25/19 1122 12/26/19 1000 12/27/19 0950 12/28/19 1040 12/29/19 1035  WBC 11.2*   < > 15.3*   < > 19.3* 19.1* 19.9* 19.3* 15.4*  NEUTROABS 10.0*  --  13.2*  --  17.2* 17.0*  --   --   --   HGB 11.5*   < > 12.2*   < > 14.1 15.4 15.2 15.5 15.0  HCT 33.8*   < > 35.9*   < > 40.8 45.6 44.3 45.8 44.8  MCV 96.0   < > 96.2   < > 95.6 96.8 96.9 97.0 98.0  PLT 192   < > 224   < > 312 349 344 351 360   < > = values  in this interval not displayed.   Basic Metabolic Panel: Recent Labs  Lab 12/23/19 0308 12/23/19 0308 12/24/19 0030 12/24/19 0030 12/25/19 1122 12/26/19 1000 12/27/19 0950 12/28/19 1040 12/29/19 1035  NA 135   < > 137   < > 134* 136 135 135 135  K 4.7   < > 4.1   < > 4.4 4.9 4.1 4.5 3.9  CL 105   < > 104   < > 101 97* 98 97* 95*  CO2 24   < > 22   < > 22 26 26 27 27   GLUCOSE 129*   < > 168*   < > 143* 145* 108* 139* 94  BUN 29*   < > 33*   < > 28* 32* 35* 39* 39*  CREATININE 0.67   < > 0.75   < > 0.88 1.10 1.09 1.18 1.22  CALCIUM 8.8*   < > 8.7*   < > 9.4 9.9 11.3* 10.6* 11.5*  MG 2.2  --  2.2  --  2.1 2.3  --   --   --    < > = values in this interval not displayed.   GFR: Estimated Creatinine Clearance: 44.2 mL/min (by C-G formula based on SCr of 1.22 mg/dL). Liver Function Tests: Recent Labs  Lab 12/25/19 1122 12/26/19 1000 12/27/19 0950 12/28/19 1040 12/29/19 1035  AST 92* 90* 77* 59* 48*  ALT 110* 121* 126* 106* 80*  ALKPHOS 113 124 122 120 117  BILITOT 0.7 1.0 1.1 1.4* 1.4*  PROT 6.0* 6.5 6.3* 6.5 6.3*  ALBUMIN 3.0* 3.1* 3.1* 3.3* 3.3*   No results for input(s): LIPASE, AMYLASE in the last 168 hours. No results for input(s): AMMONIA in the last 168 hours. Coagulation Profile: No results for input(s): INR, PROTIME in the last 168 hours. Cardiac Enzymes: No results for input(s): CKTOTAL, CKMB, CKMBINDEX, TROPONINI in the last 168 hours. BNP (last 3 results) No results for input(s): PROBNP in the last 8760 hours. HbA1C: No results for input(s): HGBA1C in the last 72 hours. CBG: No results for input(s): GLUCAP in the last 168 hours. Lipid Profile: No results for input(s): CHOL, HDL, LDLCALC, TRIG, CHOLHDL, LDLDIRECT in the last 72 hours. Thyroid Function Tests: No results for input(s): TSH, T4TOTAL, FREET4, T3FREE, THYROIDAB in the last 72 hours. Anemia Panel: No results for input(s): VITAMINB12, FOLATE, FERRITIN, TIBC, IRON, RETICCTPCT in the last 72  hours. Urine analysis:    Component Value Date/Time   COLORURINE YELLOW (A) 12/20/2019 1300   APPEARANCEUR CLEAR (A) 12/20/2019 1300   LABSPEC 1.006 12/20/2019 1300   PHURINE 6.0 12/20/2019 1300   GLUCOSEU NEGATIVE 12/20/2019 1300   HGBUR NEGATIVE 12/20/2019 1300   BILIRUBINUR NEGATIVE 12/20/2019 1300   KETONESUR NEGATIVE 12/20/2019 1300   PROTEINUR NEGATIVE 12/20/2019 1300   NITRITE NEGATIVE 12/20/2019 1300   LEUKOCYTESUR NEGATIVE 12/20/2019 1300   Sepsis Labs: Invalid input(s): PROCALCITONIN, LACTICIDVEN  Recent Results (from the past 240 hour(s))  Blood Culture (routine x 2)     Status: None   Collection Time: 12/20/19 12:59 PM   Specimen: BLOOD  Result Value Ref Range Status   Specimen Description BLOOD RIGHT AC  Final   Special Requests   Final    BOTTLES DRAWN AEROBIC AND ANAEROBIC Blood Culture results may not be optimal due to an excessive volume of blood received in culture bottles   Culture   Final    NO GROWTH 5 DAYS Performed at [facility], [address] [place], [place] [address]    Report Status 12/25/2019 FINAL  Final  Blood Culture (routine x 2)     Status: None   Collection Time: 12/20/19  1:00 PM   Specimen: BLOOD  Result Value Ref Range Status   Specimen Description BLOOD BLOOD RIGHT HAND  Final   Special Requests   Final    BOTTLES DRAWN AEROBIC AND ANAEROBIC Blood Culture adequate volume   Culture   Final    NO GROWTH 5 DAYS Performed at [facility], [address], [place], [place] [address]    Report Status 12/25/2019 FINAL  Final  Urine culture     Status: None   Collection Time: 12/20/19  1:00 PM   Specimen: In/Out Cath Urine  Result Value Ref Range Status  Specimen Description   Final    IN/OUT CATH URINE Performed at [facility], [address] [place], [place] [address]    Special Requests   Final    NONE Performed at [facility], [address] [place], [place] [address]    Culture    Final    NO GROWTH Performed at [facility], [place] [address] [place], [place] [address]    Report Status 12/21/2019 FINAL  Final      Radiology Studies: No results found.   Marzetta Board, MD, PhD [facility]  Contact via  [email or web address]  [place] P: [phone] F: [phone]

## 2019-12-30 ENCOUNTER — Inpatient Hospital Stay (HOSPITAL_COMMUNITY): Payer: Medicare HMO

## 2019-12-30 LAB — COMPREHENSIVE METABOLIC PANEL
ALT: 63 U/L — ABNORMAL HIGH (ref 0–44)
AST: 35 U/L (ref 15–41)
Albumin: 3 g/dL — ABNORMAL LOW (ref 3.5–5.0)
Alkaline Phosphatase: 108 U/L (ref 38–126)
Anion gap: 10 (ref 5–15)
BUN: 38 mg/dL — ABNORMAL HIGH (ref 8–23)
CO2: 28 mmol/L (ref 22–32)
Calcium: 11.1 mg/dL — ABNORMAL HIGH (ref 8.9–10.3)
Chloride: 98 mmol/L (ref 98–111)
Creatinine, Ser: 1.03 mg/dL (ref 0.61–1.24)
GFR calc Af Amer: >60 mL/min (ref >60)
GFR calc non Af Amer: >60 mL/min (ref >60)
Glucose, Bld: 77 mg/dL (ref 70–99)
Potassium: 4.2 mmol/L (ref 3.5–5.1)
Sodium: 136 mmol/L (ref 135–145)
Total Bilirubin: 1.1 mg/dL (ref 0.3–1.2)
Total Protein: 6 g/dL — ABNORMAL LOW (ref 6.5–8.1)

## 2019-12-30 LAB — C-REACTIVE PROTEIN: CRP: 0.6 mg/dL (ref <1.0)

## 2019-12-30 LAB — CBC
HCT: 36.5 % — ABNORMAL LOW (ref 39.0–52.0)
Hemoglobin: 12 g/dL — ABNORMAL LOW (ref 13.0–17.0)
MCH: 33 pg (ref 26.0–34.0)
MCHC: 32.9 g/dL (ref 30.0–36.0)
MCV: 100.3 fL — ABNORMAL HIGH (ref 80.0–100.0)
Platelets: 229 10*3/uL (ref 150–400)
RBC: 3.64 MIL/uL — ABNORMAL LOW (ref 4.22–5.81)
RDW: 15.2 % (ref 11.5–15.5)
WBC: 10.4 10*3/uL (ref 4.0–10.5)
nRBC: 0 % (ref 0.0–0.2)

## 2019-12-30 NOTE — Progress Notes (Signed)
Physical Therapy Treatment Patient Details Name: Duane Santos MRN: [id number] DOB: [birth year] Today's Date: 12/30/2019    History of Present Illness Pt adm with acute hypoxic respiratory failure due to covid PNA. Pt also found to have LLE DVT and PE. Pt found to have spiculated lung mass suspicious for malignancy to be worked up as outpatient.  PMH - myasthenia gravis, osteoporosis    PT Comments    Pt progressing with tx and with mobility, his activity tolerance is increasing but safety and independence need to be addressed. At therapist arrival pt was in rest room (he had removed his 02 and heart monitors), with standing up he started to call for assist as he did not feel sturdy on his feet. Pt has been educated on importance of having staff or monitors know where he is at all times incase he needs immediate assist like this, pt was agreeable to calling on staff even of for stand by assist while completing toileting etc. Pt was able to ambulate in room approx 36ft with no Ad and SBA to min guard assist. Pt was on 3L/min via  and was noted to desat to low 70s with ambulation. With toileting once returned to monitors sats reading 85% Pt able to complete pursed lip breathing both times and increase 02 sats to high 80s and into 90s.     Follow Up Recommendations  Home health PT     Equipment Recommendations  None recommended by PT    Recommendations for Other Services       Precautions / Restrictions Precautions Precautions: Fall Precaution Comments: desat w/ mobility Restrictions Weight Bearing Restrictions: No    Mobility  Bed Mobility               General bed mobility comments: pt was in rest room at therapist arrival to room  Transfers Overall transfer level: Needs assistance Equipment used: None Transfers: Sit to/from Stand;Stand Pivot Transfers Sit to Stand: Supervision Stand pivot transfers: Supervision       General transfer comment: from commode and  also from recliner  Ambulation/Gait Ambulation/Gait assistance: Supervision;Min guard Gait Distance (Feet): 46 Feet Assistive device: None Gait Pattern/deviations: Step-through pattern Gait velocity: decr   General Gait Details: Assist for lines, safety and to monitor oxygen   Stairs             Wheelchair Mobility    Modified Rankin (Stroke Patients Only)       Balance Overall balance assessment: Mild deficits observed, not formally tested                                          Cognition Arousal/Alertness: Awake/alert Behavior During Therapy: WFL for tasks assessed/performed Overall Cognitive Status: Within Functional Limits for tasks assessed                                        Exercises      General Comments        Pertinent Vitals/Pain Pain Assessment: Faces Faces Pain Scale: Hurts a little bit Pain Location: with cleaning up nose and applying creme to lips Pain Descriptors / Indicators: Discomfort    Home Living  Prior Function            PT Goals (current goals can now be found in the care plan section) Acute Rehab PT Goals Patient Stated Goal: arrived to find pt in bathroom he has removed monitors but maintained his 02 line, therapist was in process of straightening out lines when pt started to call for help from bathroom, nurse was on hand to assist, pt stated he needed someone to just stand behind him as he completd hand hygenie at sink. PT Goal Formulation: With patient Time For Goal Achievement: 01/10/20 Potential to Achieve Goals: Good Progress towards PT goals: Progressing toward goals    Frequency    Min 3X/week      PT Plan Discharge plan needs to be updated    Co-evaluation              AM-PAC PT "6 Clicks" Mobility   Outcome Measure  Help needed turning from your back to your side while in a flat bed without using bedrails?: None Help needed  moving from lying on your back to sitting on the side of a flat bed without using bedrails?: None Help needed moving to and from a bed to a chair (including a wheelchair)?: A Little Help needed standing up from a chair using your arms (e.g., wheelchair or bedside chair)?: A Little Help needed to walk in hospital room?: A Little Help needed climbing 3-5 steps with a railing? : A Little 6 Click Score: 20    End of Session Equipment Utilized During Treatment: Oxygen Activity Tolerance: Patient limited by fatigue;Patient limited by lethargy Patient left: in chair;with call bell/phone within reach Nurse Communication: Mobility status PT Visit Diagnosis: Other abnormalities of gait and mobility (R26.89)     Time: 1005-1036 PT Time Calculation (min) (ACUTE ONLY): 31 min  Charges:  $Gait Training: 8-22 mins $Therapeutic Activity: 8-22 mins                     Horald Chestnut, PT    Delford Field 12/30/2019, 1:29 PM

## 2019-12-30 NOTE — Progress Notes (Signed)
PROGRESS NOTE  Duane Santos FVC:[id number] DOB: 10/29/1946 DOA: 12/21/2019 PCP: [facility]   LOS: 9 days   Brief Narrative / Interim history: 74 year old male with history of myasthenia gravis on prednisone, hyperlipidemia, tested positive for Covid in January 4 and has been having persistent fevers and shortness of breath, and his symptoms progressed and he was admitted to the hospital on 12/21/2019.  He was quite hypoxic initially requiring 15 L high flow nasal cannula.  He was treated with steroids, remdesivir, convalescent plasma as well as Actemra.  He underwent a CT angiogram on admission which was positive for PE, he was also diagnosed with acute DVT.  CT scan also showed a spiculated solid mass in the superior segment of the right lower lobe, suspicious for malignancy.  Subjective / 24h Interval events: Overall he is doing well, he is about to eat breakfast.  No chest pain.  Complains of weakness but is trying to do as much as he can  Assessment & Plan:  Principal Problem Acute Hypoxic Respiratory Failure due to Covid-19 Viral Illness -Initially requiring 15 L high flow nasal cannula and now able to be weaned off and currently on 3-4 L nasal cannula.  He has been on this much oxygen for the last 3 days and able to tolerate activity -Continue IV steroids, currently on 6 mg of Decadron daily -Completed remdesivir -He is status post convalescent plasma on 1/12 and received Actemra on 1/13 -Continue to monitor inflammatory markers -Given prolonged hypoxia and overall stability on 4 L nasal cannula along with the presence of PE and bilateral interstitial opacities throughout the lung, along with underlying myasthenia gravis, it is possible that his hypoxia may be present for several weeks and patient is very likely that he will go home on oxygen -Remains significantly hypoxic with activity desatting into the low 70s on 3-4 L nasal cannula, tachycardic.  Subjectively he is short  of breath  COVID-19 Labs  Recent Labs    12/28/19 1040 12/29/19 1035 12/30/19 0835  DDIMER 17.33* 16.46*  --   CRP  --   --  0.6   Active Problems Acute left DVT/PE -With significantly elevated D-dimer, this is due to COVID-19 -He was initially on Lovenox and has been transitioned to Eliquis, will need 10 mg twice daily for 7 days and on 1/24 he will be transitioned to 5 mg twice daily  Myasthenia gravis -Patient on prednisone and CellCept at home, oral prednisone is now on hold as patient is on intravenous steroids -Continue to hold CellCept for now.  I was able to discuss with the patient's primary neurologist over the phone, and she recommends to discharge patient on 60 mg of prednisone daily and taper 10 mg every 4 days up to 30 mg, and maintained on 30 mg until she sees him in office in couple of weeks.  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 on 01/06/2020  Herpes labialis -Received Valtrex, on acyclovir ointment  Hypercalcemia/osteoporosis -Discontinue calcium supplements.  I wonder whether the spiculated mass is contributing   Scheduled Meds: . acyclovir ointment   Topical Q3H  . albuterol  2 puff Inhalation Q6H  . apixaban  10 mg Oral BID   Followed by  . [START ON 01/02/2020] apixaban  5 mg Oral BID  .  vitamin C  500 mg Oral Daily  . aspirin  81 mg Oral Daily  . dexamethasone (DECADRON) injection  6 mg Intravenous Daily  . docusate sodium  100 mg Oral BID  . famotidine  20 mg Oral Daily  . feeding supplement (GLUCERNA SHAKE)  237 mL Oral QID  . folic acid  1 mg Oral Daily  . multivitamin with minerals  1 tablet Oral QODAY  . thiamine  100 mg Oral Daily  . zinc sulfate  220 mg Oral Daily   Continuous Infusions: PRN Meds:.acetaminophen,  chlorpheniramine-HYDROcodone, guaiFENesin-dextromethorphan, lip balm, Melatonin, ondansetron **OR** ondansetron (ZOFRAN) IV, phenol, sodium chloride  DVT prophylaxis: Eliquis Code Status: DNR Family Communication: d/w wife [name] [phone] Patient admitted from: home Anticipated d/c place: home Barriers to d/c: Remains profoundly hypoxic with ambulation in the low 70s, short of breath and tachycardic.  He does not seem clinically ready to be discharged  Consultants:  None  Procedures:  none  Microbiology: none  Antimicrobials: none   Objective: Vitals:   12/30/19 1129 12/30/19 1130 12/30/19 1136 12/30/19 1146  BP: 112/62     Pulse: (!) 101 100 (!) 101 (!) 102  Resp: 17 (!) 21 13 16   Temp: (!) 96.3 F (35.7 C)   97.7 F (36.5 C)  TempSrc: Axillary   Oral  SpO2: 97% 97% 92% 90%  Weight:      Height:        Intake/Output Summary (Last 24 hours) at 12/30/2019 1433 Last data filed at 12/30/2019 1130 Gross per 24 hour  Intake 360 ml  Output 1850 ml  Net -1490 ml   Filed Weights   12/21/19 2044  Weight: 57.1 kg    Examination:  Constitutional: Sitting in chair, no apparent distress, about to eat breakfast Eyes: No scleral icterus ENMT: Moist mucous membranes Neck: normal, supple Respiratory: Diminished at the bases but overall clear, no wheezing, no crackles Cardiovascular: Regular rate and rhythm, no murmurs.  No edema Abdomen: Soft, nontender, nondistended, bowel sounds positive Musculoskeletal: no clubbing / cyanosis.  Skin: No rashes seen Neurologic: Nonfocal, equal strength, ambulatory   Data Reviewed: I have independently reviewed following labs and imaging studies   CBC: Recent Labs  Lab 12/24/19 0030 12/24/19 0030 12/25/19 1122 12/25/19 1122 12/26/19 1000 12/27/19 0950 12/28/19 1040 12/29/19 1035 12/30/19 0835  WBC 15.3*   < > 19.3*   < > 19.1* 19.9* 19.3* 15.4* 10.4  NEUTROABS 13.2*  --  17.2*  --  17.0*  --   --   --   --   HGB  12.2*   < > 14.1   < > 15.4 15.2 15.5 15.0 12.0*  HCT 35.9*   < > 40.8   < > 45.6 44.3 45.8 44.8 36.5*  MCV 96.2   < > 95.6   < > 96.8 96.9 97.0 98.0 100.3*  PLT 224   < > 312   < > 349 344 351 360 229   < > = values in this interval not displayed.   Basic Metabolic Panel: Recent Labs  Lab 12/24/19 0030 12/24/19 0030 12/25/19 1122 12/25/19 1122 12/26/19 1000 12/27/19 0950 12/28/19 1040 12/29/19 1035 12/30/19 0835  NA 137   < > 134*   < > 136 135 135 135 136  K 4.1   < > 4.4   < > 4.9 4.1 4.5 3.9 4.2  CL 104   < > 101   < > 97* 98 97* 95* 98  CO2 22   < >  22   < > 26 26 27 27 28   GLUCOSE 168*   < > 143*   < > 145* 108* 139* 94 77  BUN 33*   < > 28*   < > 32* 35* 39* 39* 38*  CREATININE 0.75   < > 0.88   < > 1.10 1.09 1.18 1.22 1.03  CALCIUM 8.7*   < > 9.4   < > 9.9 11.3* 10.6* 11.5* 11.1*  MG 2.2  --  2.1  --  2.3  --   --   --   --    < > = values in this interval not displayed.   GFR: Estimated Creatinine Clearance: 52.4 mL/min (by C-G formula based on SCr of 1.03 mg/dL). Liver Function Tests: Recent Labs  Lab 12/26/19 1000 12/27/19 0950 12/28/19 1040 12/29/19 1035 12/30/19 0835  AST 90* 77* 59* 48* 35  ALT 121* 126* 106* 80* 63*  ALKPHOS 124 122 120 117 108  BILITOT 1.0 1.1 1.4* 1.4* 1.1  PROT 6.5 6.3* 6.5 6.3* 6.0*  ALBUMIN 3.1* 3.1* 3.3* 3.3* 3.0*   No results for input(s): LIPASE, AMYLASE in the last 168 hours. No results for input(s): AMMONIA in the last 168 hours. Coagulation Profile: No results for input(s): INR, PROTIME in the last 168 hours. Cardiac Enzymes: No results for input(s): CKTOTAL, CKMB, CKMBINDEX, TROPONINI in the last 168 hours. BNP (last 3 results) No results for input(s): PROBNP in the last 8760 hours. HbA1C: No results for input(s): HGBA1C in the last 72 hours. CBG: No results for input(s): GLUCAP in the last 168 hours. Lipid Profile: No results for input(s): CHOL, HDL, LDLCALC, TRIG, CHOLHDL, LDLDIRECT in the last 72 hours. Thyroid  Function Tests: No results for input(s): TSH, T4TOTAL, FREET4, T3FREE, THYROIDAB in the last 72 hours. Anemia Panel: No results for input(s): VITAMINB12, FOLATE, FERRITIN, TIBC, IRON, RETICCTPCT in the last 72 hours. Urine analysis:    Component Value Date/Time   COLORURINE YELLOW (A) 12/20/2019 1300   APPEARANCEUR CLEAR (A) 12/20/2019 1300   LABSPEC 1.006 12/20/2019 1300   PHURINE 6.0 12/20/2019 1300   GLUCOSEU NEGATIVE 12/20/2019 1300   HGBUR NEGATIVE 12/20/2019 1300   BILIRUBINUR NEGATIVE 12/20/2019 1300   KETONESUR NEGATIVE 12/20/2019 1300   PROTEINUR NEGATIVE 12/20/2019 1300   NITRITE NEGATIVE 12/20/2019 1300   LEUKOCYTESUR NEGATIVE 12/20/2019 1300   Sepsis Labs: Invalid input(s): PROCALCITONIN, LACTICIDVEN  No results found for this or any previous visit (from the past 240 hour(s)).    Radiology Studies: No results found.   Marzetta Board, MD, PhD [facility]  Contact via  [email or web address]  [place] P: [phone] F: [phone]

## 2019-12-30 NOTE — TOC Initial Note (Signed)
Transition of Care [facility]) - Initial/Assessment Note    Patient Details  Name: Duane Santos MRN: [id number] Date of Birth: [birth year]  Transition of Care [facility]) CM/SW Contact:    Shade Flood, LCSW Phone Number: 12/30/2019, 3:55 PM  Clinical Narrative:                  Pt from home with wife. Anticipating dc home tomorrow with [facility] and DME. [place] arranged with [facility]. They are going to look and see if they can start care right away or need to delay due to the criteria they follow for COVID patients. Updated [place] from [place] that at time of admission, EMS indicated pt had tested positive 12/13/19. It does not appear that there is a COVID test that was done at [facility].  [facility] requires that pt be ten days post diagnosis before they start care.   Will follow up in AM to further assist with DME needs for dc.   Expected Discharge Plan: [place] Barriers to Discharge: Continued Medical Work up   Patient Goals and CMS Choice        Expected Discharge Plan and Services Expected Discharge Plan: [place] In-house Referral: Clinical Social Work   Post Acute Care Choice: [place] arrangements for the past 2 months: [place] Agency: [facility] Date [place]: 12/30/19 Time [place]: [place] Representative spoke with at [place]: [place] Arrangements/Services Living arrangements for the past 2 months: [place] with:: Spouse Patient language and need for interpreter reviewed:: Yes Do you feel safe going back to the place where you live?: Yes      Need for Family Participation in Patient Care: Yes (Comment) Care giver support system in place?: Yes (comment)   Criminal Activity/Legal Involvement Pertinent to Current Situation/Hospitalization: No - Comment as needed  Activities of Daily Living Home Assistive Devices/Equipment:  None ADL Screening (condition at time of admission) Patient's cognitive ability adequate to safely complete daily activities?: Yes Is the patient deaf or have difficulty hearing?: No Does the patient have difficulty seeing, even when wearing glasses/contacts?: No Does the patient have difficulty concentrating, remembering, or making decisions?: No Patient able to express need for assistance with ADLs?: No Does the patient have difficulty dressing or bathing?: No Independently performs ADLs?: Yes (appropriate for developmental age) Does the patient have difficulty walking or climbing stairs?: No Weakness of Legs: None Weakness of Arms/Hands: None  Permission Sought/Granted                  Emotional Assessment       Orientation: : Oriented to Self, Oriented to Place, Oriented to Situation Alcohol / Substance Use: Not Applicable Psych Involvement: No (comment)  Admission diagnosis:  Pneumonia due to COVID-19 virus [U07.1, J12.82] Patient Active Problem List   Diagnosis Date Noted  . Pneumonia due to COVID-19 virus 12/21/2019  . Acute respiratory failure with hypoxia ([place]) 12/21/2019  . Myasthenia gravis ([place]) 12/21/2019  . Normocytic anemia 12/21/2019  . Osteoporosis 12/21/2019  . PNA (pneumonia) 12/20/2019   PCP:  [place] [facility]:   [facility] [place], [address] [place]  [place] [address] Phone: [phone] Fax: [phone]     Social Determinants of Health (SDOH) Interventions    Readmission Risk Interventions Readmission Risk Prevention Plan 12/30/2019  Transportation Screening Complete  Home Care Screening Complete  Medication Review (RN CM) Complete  Some recent data might be hidden

## 2019-12-30 NOTE — Progress Notes (Signed)
Occupational Therapy Treatment Patient Details Name: Duane Santos MRN: [id number] DOB: 1946-07-08 Today's Date: 12/30/2019    History of present illness Pt adm with acute hypoxic respiratory failure due to covid PNA. Pt also found to have LLE DVT and PE. Pt found to have spiculated lung mass suspicious for malignancy to be worked up as outpatient.  PMH - myasthenia gravis, osteoporosis   OT comments  Patient stated he is still feeling very weak. Facetimed his wife and updated him on his therapy for the day.  He was up in chair resting on 4Lwith SpO2 in low 90s.  Stood at sink for ADLs and his HR increased to 140 and desat to upper 70s.  Took 3 rest breaks and increased O2 to 6L HFNC.  On 6L he desats to lower 80s with activity.  He recovers quickly (in less than 2 min) when seated and performing pursed lip breathing.  He needs reminders to take breaks with activity.  Practiced incentive spirometer and flutter valve.  Left him on 3L per nursing with SpO2 in low 90s.  Patient is very willing for therapy.  Will continue to follow him acutely with OT.    Follow Up Recommendations  Home health OT    Equipment Recommendations  3 in 1 bedside commode    Recommendations for Other Services      Precautions / Restrictions Precautions Precautions: Fall Precaution Comments: desat w/ mobility Restrictions Weight Bearing Restrictions: No       Mobility Bed Mobility               General bed mobility comments: patient up in chair  Transfers Overall transfer level: Needs assistance Equipment used: None Transfers: Sit to/from Stand;Stand Pivot Transfers Sit to Stand: Supervision              Balance Overall balance assessment: Mild deficits observed, not formally tested                                         ADL either performed or assessed with clinical judgement   ADL Overall ADL's : Needs assistance/impaired     Grooming: Wash/dry hands;Wash/dry  face;Oral care;Standing;Supervision/safety   Upper Body Bathing: Supervision/ safety;Standing   Lower Body Bathing: Supervison/ safety;Sit to/from stand       Lower Body Dressing: Supervision/safety;Sit to/from stand               Functional mobility during ADLs: Supervision/safety       Vision       Perception     Praxis      Cognition Arousal/Alertness: Awake/alert Behavior During Therapy: WFL for tasks assessed/performed Overall Cognitive Status: Within Functional Limits for tasks assessed                                          Exercises     Shoulder Instructions       General Comments desats and tachy when standing, recovers quickly though with rest    Pertinent Vitals/ Pain       Pain Assessment: Faces Faces Pain Scale: Hurts a little bit  Home Living  Prior Functioning/Environment              Frequency  Min 3X/week        Progress Toward Goals  OT Goals(current goals can now be found in the care plan section)  Progress towards OT goals: Progressing toward goals  Acute Rehab OT Goals Patient Stated Goal: To go home OT Goal Formulation: With patient Time For Goal Achievement: 01/10/20 Potential to Achieve Goals: Good  Plan Discharge plan remains appropriate    Co-evaluation                 AM-PAC OT "6 Clicks" Daily Activity     Outcome Measure   Help from another person eating meals?: None Help from another person taking care of personal grooming?: A Little Help from another person toileting, which includes using toliet, bedpan, or urinal?: A Little Help from another person bathing (including washing, rinsing, drying)?: A Little Help from another person to put on and taking off regular upper body clothing?: A Little Help from another person to put on and taking off regular lower body clothing?: A Little 6 Click Score: 19    End of Session  Equipment Utilized During Treatment: Oxygen  OT Visit Diagnosis: Unsteadiness on feet (R26.81);Muscle weakness (generalized) (M62.81)   Activity Tolerance Patient limited by fatigue   Patient Left in chair;with call bell/phone within reach   Nurse Communication Mobility status        Time: 1110-1151 OT Time Calculation (min): 41 min  Charges: OT General Charges $OT Visit: 1 Visit OT Treatments $Self Care/Home Management : 38-52 mins  August Luz, OTR/L    Phylliss Bob 12/30/2019, 3:01 PM

## 2019-12-30 NOTE — Progress Notes (Signed)
NIF > -40 with great effort

## 2019-12-31 LAB — COMPREHENSIVE METABOLIC PANEL
ALT: 54 U/L — ABNORMAL HIGH (ref 0–44)
AST: 37 U/L (ref 15–41)
Albumin: 3 g/dL — ABNORMAL LOW (ref 3.5–5.0)
Alkaline Phosphatase: 107 U/L (ref 38–126)
Anion gap: 10 (ref 5–15)
BUN: 36 mg/dL — ABNORMAL HIGH (ref 8–23)
CO2: 30 mmol/L (ref 22–32)
Calcium: 10.4 mg/dL — ABNORMAL HIGH (ref 8.9–10.3)
Chloride: 97 mmol/L — ABNORMAL LOW (ref 98–111)
Creatinine, Ser: 1.06 mg/dL (ref 0.61–1.24)
GFR calc Af Amer: >60 mL/min (ref >60)
GFR calc non Af Amer: >60 mL/min (ref >60)
Glucose, Bld: 105 mg/dL — ABNORMAL HIGH (ref 70–99)
Potassium: 3.6 mmol/L (ref 3.5–5.1)
Sodium: 137 mmol/L (ref 135–145)
Total Bilirubin: 1.1 mg/dL (ref 0.3–1.2)
Total Protein: 6.3 g/dL — ABNORMAL LOW (ref 6.5–8.1)

## 2019-12-31 LAB — CBC
HCT: 43.2 % (ref 39.0–52.0)
Hemoglobin: 14.2 g/dL (ref 13.0–17.0)
MCH: 32.6 pg (ref 26.0–34.0)
MCHC: 32.9 g/dL (ref 30.0–36.0)
MCV: 99.1 fL (ref 80.0–100.0)
Platelets: 303 10*3/uL (ref 150–400)
RBC: 4.36 MIL/uL (ref 4.22–5.81)
RDW: 15.2 % (ref 11.5–15.5)
WBC: 10.7 10*3/uL — ABNORMAL HIGH (ref 4.0–10.5)
nRBC: 0 % (ref 0.0–0.2)

## 2019-12-31 NOTE — Progress Notes (Signed)
PROGRESS NOTE  SHERI PROWS ZHG:[id number] DOB: 12/14/1945 DOA: 12/21/2019 PCP: [place]   LOS: 10 days   Brief Narrative / Interim history: 74 year old male with history of myasthenia gravis on prednisone, hyperlipidemia, tested positive for Covid in January 4 and has been having persistent fevers and shortness of breath, and his symptoms progressed and he was admitted to the hospital on 12/21/2019.  He was quite hypoxic initially requiring 15 L high flow nasal cannula.  He was treated with steroids, remdesivir, convalescent plasma as well as Actemra.  He underwent a CT angiogram on admission which was positive for PE, he was also diagnosed with acute DVT.  CT scan also showed a spiculated solid mass in the superior segment of the right lower lobe, suspicious for malignancy.  Subjective / 24h Interval events: Remains weak, when I was in the room transferred from the bed onto the chair and sats dropped into the 70s.  Complains of shortness of breath.  Assessment & Plan:  Principal Problem Acute Hypoxic Respiratory Failure due to Covid-19 Viral Illness -Initially requiring 15 L high flow nasal cannula and now able to be weaned off and currently on 3-4 L nasal cannula.  -Patient has poor reserves the setting quickly upon any activity -Continue steroids -Completed remdesivir while hospitalized -He is status post convalescent plasma on 1/12 and received Actemra on 1/13 -Continue to monitor inflammatory markers -Given prolonged hypoxia and overall stability on 4 L nasal cannula along with the presence of PE and bilateral interstitial opacities throughout the lung, along with underlying myasthenia gravis, it is possible that his hypoxia may be present for several weeks and patient is very likely that he will go home on oxygen -Worked with physical therapy and able to ambulate 150 feet on 4 L and sats went as low as 80%, improving upon rest.  Seems to be overall improving but not yet  ready for discharge  [place]    12/29/19 1035 12/30/19 0835  DDIMER 16.46*  --   CRP  --  0.6   Active Problems Acute left DVT/PE -With significantly elevated D-dimer, this is due to COVID-19 -He was initially on Lovenox and has been transitioned to Eliquis, will need 10 mg twice daily for 7 days and on 1/24 he will be transitioned to 5 mg twice daily  Myasthenia gravis -Patient on prednisone and CellCept at home, oral prednisone is now on hold as patient is on intravenous steroids -Continue to hold CellCept for now.  I was able to discuss with the patient's primary neurologist over the phone, and she recommends to discharge patient on 60 mg of prednisone daily and taper 10 mg every 4 days up to 30 mg, and maintained on 30 mg until she sees him in office in couple of weeks.  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 on 01/06/2020  Herpes labialis -Received Valtrex, on acyclovir ointment  Hypercalcemia/osteoporosis -Discontinue calcium supplements.  I wonder whether the spiculated mass is contributing   Scheduled Meds: . acyclovir ointment   Topical Q3H  . albuterol  2 puff Inhalation Q6H  . apixaban  10 mg Oral BID   Followed by  . [START ON 01/02/2020] apixaban  5 mg Oral BID  . vitamin C  500 mg Oral Daily  . aspirin  81 mg Oral Daily  . dexamethasone (DECADRON) injection  6 mg Intravenous Daily  . docusate sodium  100 mg Oral BID  . famotidine  20 mg Oral Daily  . feeding supplement (GLUCERNA SHAKE)  237 mL Oral QID  . folic acid  1 mg Oral Daily  . multivitamin with minerals  1 tablet Oral QODAY  . thiamine  100 mg Oral Daily  . zinc sulfate  220 mg Oral Daily   Continuous Infusions: PRN Meds:.acetaminophen,  chlorpheniramine-HYDROcodone, guaiFENesin-dextromethorphan, lip balm, Melatonin, ondansetron **OR** ondansetron (ZOFRAN) IV, phenol, sodium chloride  DVT prophylaxis: Eliquis Code Status: DNR Family Communication: We will update wife [name] [phone] in the afternoon Patient admitted from: home Anticipated d/c place: home Barriers to d/c: Remains profoundly hypoxic, once O2 levels better will be able to be discharged home  Consultants:  None  Procedures:  none  Microbiology: none  Antimicrobials: none   Objective: Vitals:   12/31/19 0300 12/31/19 0746 12/31/19 0926 12/31/19 1100  BP:  128/76    Pulse: (!) 102 90  92  Resp: 20 (!) 22  (!) 35  Temp:  98.3 F (36.8 C)    TempSrc:  Oral    SpO2: 94% 95% 95% 96%  Weight:      Height:        Intake/Output Summary (Last 24 hours) at 12/31/2019 1225 Last data filed at 12/30/2019 1800 Gross per 24 hour  Intake 480 ml  Output 502 ml  Net -22 ml   Filed Weights   12/21/19 2044  Weight: 57.1 kg    Examination:  Constitutional: Laying in bed, no apparent distress Eyes: No scleral icterus ENMT: Moist mucous membranes Neck: normal, supple Respiratory: Diminished at the bases but overall clear without wheezing or crackles Cardiovascular: Regular rate and rhythm, no murmurs, no edema Abdomen: Soft, nontender, nondistended bowel sounds positive Musculoskeletal: no clubbing / cyanosis.  Skin: No rashes seen Neurologic: Grossly nonfocal, ambulatory   Data Reviewed: I have independently reviewed following labs and imaging studies   CBC: Recent Labs  Lab 12/25/19 1122 12/25/19 1122 12/26/19 1000 12/26/19 1000 12/27/19 0950 12/28/19 1040 12/29/19 1035 12/30/19 0835 12/31/19 0927  WBC 19.3*   < > 19.1*   < > 19.9* 19.3* 15.4* 10.4 10.7*  NEUTROABS 17.2*  --  17.0*  --   --   --   --   --   --   HGB 14.1   < > 15.4   < > 15.2 15.5 15.0 12.0* 14.2  HCT 40.8   < > 45.6   < > 44.3 45.8 44.8 36.5* 43.2  MCV  95.6   < > 96.8   < > 96.9 97.0 98.0 100.3* 99.1  PLT 312   < > 349   < > 344 351 360 229 303   < > = values in this interval not displayed.   Basic Metabolic Panel: Recent Labs  Lab 12/25/19 1122 12/25/19 1122 12/26/19 1000 12/26/19 1000 12/27/19 0950 12/28/19 1040 12/29/19 1035 12/30/19 0835 12/31/19 0927  NA 134*   < > 136   < > 135 135 135 136 137  K 4.4   < > 4.9   < > 4.1 4.5 3.9 4.2 3.6  CL 101   < > 97*   < > 98 97* 95* 98 97*  CO2 22   < > 26   < > 26 27 27 28 30   GLUCOSE 143*   < >  145*   < > 108* 139* 94 77 105*  BUN 28*   < > 32*   < > 35* 39* 39* 38* 36*  CREATININE 0.88   < > 1.10   < > 1.09 1.18 1.22 1.03 1.06  CALCIUM 9.4   < > 9.9   < > 11.3* 10.6* 11.5* 11.1* 10.4*  MG 2.1  --  2.3  --   --   --   --   --   --    < > = values in this interval not displayed.   GFR: Estimated Creatinine Clearance: 50.9 mL/min (by C-G formula based on SCr of 1.06 mg/dL). Liver Function Tests: Recent Labs  Lab 12/27/19 0950 12/28/19 1040 12/29/19 1035 12/30/19 0835 12/31/19 0927  AST 77* 59* 48* 35 37  ALT 126* 106* 80* 63* 54*  ALKPHOS 122 120 117 108 107  BILITOT 1.1 1.4* 1.4* 1.1 1.1  PROT 6.3* 6.5 6.3* 6.0* 6.3*  ALBUMIN 3.1* 3.3* 3.3* 3.0* 3.0*   No results for input(s): LIPASE, AMYLASE in the last 168 hours. No results for input(s): AMMONIA in the last 168 hours. Coagulation Profile: No results for input(s): INR, PROTIME in the last 168 hours. Cardiac Enzymes: No results for input(s): CKTOTAL, CKMB, CKMBINDEX, TROPONINI in the last 168 hours. BNP (last 3 results) No results for input(s): PROBNP in the last 8760 hours. HbA1C: No results for input(s): HGBA1C in the last 72 hours. CBG: No results for input(s): GLUCAP in the last 168 hours. Lipid Profile: No results for input(s): CHOL, HDL, LDLCALC, TRIG, CHOLHDL, LDLDIRECT in the last 72 hours. Thyroid Function Tests: No results for input(s): TSH, T4TOTAL, FREET4, T3FREE, THYROIDAB in the last 72  hours. Anemia Panel: No results for input(s): VITAMINB12, FOLATE, FERRITIN, TIBC, IRON, RETICCTPCT in the last 72 hours. Urine analysis:    Component Value Date/Time   COLORURINE YELLOW (A) 12/20/2019 1300   APPEARANCEUR CLEAR (A) 12/20/2019 1300   LABSPEC 1.006 12/20/2019 1300   PHURINE 6.0 12/20/2019 1300   GLUCOSEU NEGATIVE 12/20/2019 1300   HGBUR NEGATIVE 12/20/2019 1300   BILIRUBINUR NEGATIVE 12/20/2019 1300   KETONESUR NEGATIVE 12/20/2019 1300   PROTEINUR NEGATIVE 12/20/2019 1300   NITRITE NEGATIVE 12/20/2019 1300   LEUKOCYTESUR NEGATIVE 12/20/2019 1300   Sepsis Labs: Invalid input(s): PROCALCITONIN, LACTICIDVEN  No results found for this or any previous visit (from the past 240 hour(s)).    Radiology Studies: DG CHEST PORT 1 VIEW  Result Date: 12/30/2019 CLINICAL DATA:  Hypoxemia. EXAM: PORTABLE CHEST 1 VIEW COMPARISON:  12/22/2019 and older exams. FINDINGS: Cardiac silhouette normal in size.  No mediastinal or hilar masses. Bilateral interstitial and hazy airspace opacities are unchanged from the most recent prior exam. The nodule noted in the superior segment of the right upper lobe on the CT from 12/23/2019 is not resolved radiographically. No pleural effusion or pneumothorax. Old, healed left rib fractures are stable. IMPRESSION: 1. No radiographic change from the study dated 12/22/2019. 2. Bilateral interstitial and hazy airspace lung opacities. Findings are consistent with atypical infection, specifically COVID-19 pneumonia. Electronically Signed   By: Lajean Manes M.D.   On: 12/30/2019 17:56     Marzetta Board, MD, PhD [facility]  Contact via  [email or web address]  [place] P: [phone] F: [phone]

## 2019-12-31 NOTE — Progress Notes (Signed)
Ambulation Note  Saturation Pre: 96% on 3L  Ambulation Distance: 230 ft  Saturation During Ambulation: 89-94% on 4L  Notes: Standby assist. Tolerated well. Pt returned to recliner with call bell within reach, sats were 94% on 3L.  Philis Kendall, MS, ACSM CEP 2:02 PM 12/31/2019

## 2019-12-31 NOTE — Progress Notes (Signed)
NIF >-40. Great effort

## 2019-12-31 NOTE — Progress Notes (Signed)
Patient states he does not want to be woken up for anything from 2200 on for anything.  He is alert and oriented and states that when/if he needs anything he will use the call bell to request for assistance.  Discussed with patient that vital signs were ordered at midnight and 0400, inhalers at 0200, and ointment for his sores q3hr, and patient voiced understanding but continued to state that he did not want to be bothered with it.  Patient able to demonstrate properly how to use the call bell and I assured him I would respect his wishes and rights as a patient.

## 2019-12-31 NOTE — Progress Notes (Signed)
Physical Therapy Treatment Patient Details Name: Duane Santos MRN: [id number] DOB: Oct 12, 1946 Today's Date: 12/31/2019    History of Present Illness Pt adm with acute hypoxic respiratory failure due to covid PNA. Pt also found to have LLE DVT and PE. Pt found to have spiculated lung mass suspicious for malignancy to be worked up as outpatient.  PMH - myasthenia gravis, osteoporosis    PT Comments    Pt making steady progress. Able to amb 150' on 4L O2 with SpO2 going to 80%. Returns to 89% after 2 minutes seated rest with pt focusing on slowing down breathing.    Follow Up Recommendations  Home health PT     Equipment Recommendations  None recommended by PT    Recommendations for Other Services       Precautions / Restrictions Precautions Precautions: Fall Precaution Comments: desat w/ mobility Restrictions Weight Bearing Restrictions: No    Mobility  Bed Mobility               General bed mobility comments: patient up in chair  Transfers Overall transfer level: Needs assistance Equipment used: None Transfers: Sit to/from Stand Sit to Stand: Supervision         General transfer comment: assist for safety and lines  Ambulation/Gait Ambulation/Gait assistance: Supervision Gait Distance (Feet): 150 Feet Assistive device: None Gait Pattern/deviations: Step-through pattern;Drifts right/left Gait velocity: decr Gait velocity interpretation: 1.31 - 2.62 ft/sec, indicative of limited community ambulator General Gait Details: assist for safety and Engineering geologist    Modified Rankin (Stroke Patients Only)       Balance Overall balance assessment: Mild deficits observed, not formally tested                                          Cognition Arousal/Alertness: Awake/alert Behavior During Therapy: WFL for tasks assessed/performed Overall Cognitive Status: Within Functional Limits for  tasks assessed                                        Exercises      General Comments General comments (skin integrity, edema, etc.): At rest pt on 3L O2 with SpO2 93%. Amb on 4L o2 with SpO2 to 80%. Seated rest with SpO2 returning to 89% after 2 minutes with pt concentrating on slowing breathing      Pertinent Vitals/Pain Pain Assessment: Faces Faces Pain Scale: No hurt    Home Living                      Prior Function            PT Goals (current goals can now be found in the care plan section) Acute Rehab PT Goals Patient Stated Goal: To go home Progress towards PT goals: Progressing toward goals    Frequency    Min 3X/week      PT Plan Current plan remains appropriate    Co-evaluation              AM-PAC PT "6 Clicks" Mobility   Outcome Measure  Help needed turning from your back to your side while in a flat bed without using bedrails?: None Help needed moving from  lying on your back to sitting on the side of a flat bed without using bedrails?: None Help needed moving to and from a bed to a chair (including a wheelchair)?: A Little Help needed standing up from a chair using your arms (e.g., wheelchair or bedside chair)?: A Little Help needed to walk in hospital room?: A Little Help needed climbing 3-5 steps with a railing? : A Little 6 Click Score: 20    End of Session Equipment Utilized During Treatment: Oxygen Activity Tolerance: Treatment limited secondary to medical complications (Comment)(decr SpO2) Patient left: in chair;with call bell/phone within reach   PT Visit Diagnosis: Other abnormalities of gait and mobility (R26.89)     Time: 5625-6389 PT Time Calculation (min) (ACUTE ONLY): 17 min  Charges:  $Gait Training: 8-22 mins                     [place] Pager [phone] Office [place] 12/31/2019, 11:32 AM

## 2020-01-01 LAB — CBC
HCT: 38.9 % — ABNORMAL LOW (ref 39.0–52.0)
Hemoglobin: 12.9 g/dL — ABNORMAL LOW (ref 13.0–17.0)
MCH: 32.9 pg (ref 26.0–34.0)
MCHC: 33.2 g/dL (ref 30.0–36.0)
MCV: 99.2 fL (ref 80.0–100.0)
Platelets: 266 10*3/uL (ref 150–400)
RBC: 3.92 MIL/uL — ABNORMAL LOW (ref 4.22–5.81)
RDW: 15.2 % (ref 11.5–15.5)
WBC: 10.1 10*3/uL (ref 4.0–10.5)
nRBC: 0 % (ref 0.0–0.2)

## 2020-01-01 LAB — COMPREHENSIVE METABOLIC PANEL
ALT: 49 U/L — ABNORMAL HIGH (ref 0–44)
AST: 31 U/L (ref 15–41)
Albumin: 2.7 g/dL — ABNORMAL LOW (ref 3.5–5.0)
Alkaline Phosphatase: 91 U/L (ref 38–126)
Anion gap: 9 (ref 5–15)
BUN: 40 mg/dL — ABNORMAL HIGH (ref 8–23)
CO2: 30 mmol/L (ref 22–32)
Calcium: 11.2 mg/dL — ABNORMAL HIGH (ref 8.9–10.3)
Chloride: 97 mmol/L — ABNORMAL LOW (ref 98–111)
Creatinine, Ser: 0.99 mg/dL (ref 0.61–1.24)
GFR calc Af Amer: >60 mL/min (ref >60)
GFR calc non Af Amer: >60 mL/min (ref >60)
Glucose, Bld: 95 mg/dL (ref 70–99)
Potassium: 4.4 mmol/L (ref 3.5–5.1)
Sodium: 136 mmol/L (ref 135–145)
Total Bilirubin: 0.6 mg/dL (ref 0.3–1.2)
Total Protein: 5.6 g/dL — ABNORMAL LOW (ref 6.5–8.1)

## 2020-01-01 LAB — PHOSPHORUS: Phosphorus: 4.6 mg/dL (ref 2.5–4.6)

## 2020-01-01 LAB — MAGNESIUM: Magnesium: 2.4 mg/dL (ref 1.7–2.4)

## 2020-01-01 LAB — C-REACTIVE PROTEIN: CRP: 0.6 mg/dL (ref <1.0)

## 2020-01-01 LAB — D-DIMER, QUANTITATIVE: D-Dimer, Quant: 12.75 ug/mL-FEU — ABNORMAL HIGH (ref 0.00–0.50)

## 2020-01-01 MED ORDER — OXYMETAZOLINE HCL 0.05 % NA SOLN
1.0000 | Freq: Two times a day (BID) | NASAL | Status: DC
  Administered 2020-01-01 – 2020-01-03 (×5): 1 via NASAL
  Filled 2020-01-01: qty 15

## 2020-01-01 NOTE — Progress Notes (Signed)
PROGRESS NOTE  Duane Santos KGU:[id number] DOB: 02/18/1946 DOA: 12/21/2019 PCP: [place]   LOS: 11 days   Brief Narrative / Interim history: 74 year old male with history of myasthenia gravis on prednisone, hyperlipidemia, tested positive for Covid in January 4 and has been having persistent fevers and shortness of breath, and his symptoms progressed and he was admitted to the hospital on 12/21/2019.  He was quite hypoxic initially requiring 15 L high flow nasal cannula.  He was treated with steroids, remdesivir, convalescent plasma as well as Actemra.  He underwent a CT angiogram on admission which was positive for PE, he was also diagnosed with acute DVT.  CT scan also showed a spiculated solid mass in the superior segment of the right lower lobe, suspicious for malignancy.  Subjective / 24h Interval events: Reports a pretty significant nosebleed this morning.  Worked with physical therapy last night and felt stronger  Assessment & Plan:  Principal Problem Acute Hypoxic Respiratory Failure due to Covid-19 Viral Illness -Initially requiring 15 L high flow nasal cannula and now able to be weaned off and currently on 3-4 L nasal cannula with ambulation in room air at rest. -Patient has poor reserves the setting quickly upon any activity -Continue steroids -Completed remdesivir while hospitalized -He is status post convalescent plasma on 1/12 and received Actemra on 1/13 -Continue to monitor inflammatory markers -Given prolonged hypoxia and overall stability on 4 L nasal cannula along with the presence of PE and bilateral interstitial opacities throughout the lung, along with underlying myasthenia gravis, it is possible that his hypoxia may be present for several weeks and patient is very likely that he will go home on oxygen -Worked with physical therapy and able to ambulate, still felt weak but improved  COVID-19 Labs  Recent Labs    12/30/19 0835 01/01/20 0028  DDIMER   --  12.75*  CRP 0.6 0.6   Active Problems Acute left DVT/PE -With significantly elevated D-dimer, this is due to COVID-19 -He was initially on Lovenox and has been transitioned to Eliquis, will need 10 mg twice daily for 7 days and on 1/24 he will be transitioned to 5 mg twice daily -Discontinue aspirin given nosebleed this morning  Epistaxis -Likely in the setting of oxygen use as well as anticoagulation with Eliquis -He is also on aspirin but does not have real indication this he denies any history of MI/CVA.  Discontinue aspirin -Added Afrin  Myasthenia gravis -Patient on prednisone and CellCept at home, oral prednisone is now on hold as patient is on intravenous steroids -Continue to hold CellCept for now.  I was able to discuss with the patient's primary neurologist over the phone, and she recommends to discharge patient on 60 mg of prednisone daily and taper 10 mg every 4 days up to 30 mg, and maintained on 30 mg until she sees him in office in couple of weeks.  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 on 01/06/2020  Herpes labialis -Received Valtrex, on acyclovir ointment  Hypercalcemia/osteoporosis -Discontinue calcium supplements.  I wonder whether the spiculated mass is contributing   Scheduled Meds: . acyclovir ointment   Topical Q3H  . albuterol  2 puff Inhalation Q6H  . apixaban  10 mg Oral BID   Followed by  . [  START ON 01/02/2020] apixaban  5 mg Oral BID  . vitamin C  500 mg Oral Daily  . dexamethasone (DECADRON) injection  6 mg Intravenous Daily  . docusate sodium  100 mg Oral BID  . famotidine  20 mg Oral Daily  . feeding supplement (GLUCERNA SHAKE)  237 mL Oral QID  . folic acid  1 mg Oral Daily  . multivitamin with minerals  1  tablet Oral QODAY  . oxymetazoline  1 spray Each Nare BID  . thiamine  100 mg Oral Daily  . zinc sulfate  220 mg Oral Daily   Continuous Infusions: PRN Meds:.acetaminophen, chlorpheniramine-HYDROcodone, guaiFENesin-dextromethorphan, lip balm, Melatonin, ondansetron **OR** ondansetron (ZOFRAN) IV, phenol, sodium chloride  DVT prophylaxis: Eliquis Code Status: DNR Family Communication: We will update wife [name] [phone] in the afternoon Patient admitted from: home Anticipated d/c place: home Barriers to d/c: Hypoxia improving, anticipate home discharge within 24 hours pending improvement in his epistaxis  Consultants:  None  Procedures:  none  Microbiology: none  Antimicrobials: none   Objective: Vitals:   12/31/19 1628 12/31/19 1916 01/01/20 0736 01/01/20 1108  BP: 118/71 123/80 132/80 120/79  Pulse: 83 96 89 91  Resp: (!) 22 (!) 22 20 18   Temp: 97.7 F (36.5 C) 97.8 F (36.6 C) 97.9 F (36.6 C) 98.2 F (36.8 C)  TempSrc: Oral Oral Oral Oral  SpO2: 95% 100% 94% 94%  Weight:      Height:        Intake/Output Summary (Last 24 hours) at 01/01/2020 1137 Last data filed at 01/01/2020 5053 Gross per 24 hour  Intake 1200 ml  Output 1800 ml  Net -600 ml   Filed Weights   12/21/19 2044  Weight: 57.1 kg    Examination:  Constitutional: In chair, no distress Eyes: No scleral icterus ENMT: Moist mucous membranes Neck: normal, supple Respiratory: Diminished at the bases, no wheezing, no crackles Cardiovascular: Regular rate and rhythm, no murmurs, no peripheral edema Abdomen: soft, nontender, nondistended, positive bowel sounds Musculoskeletal: no clubbing / cyanosis.  Skin: No rashes seen Neurologic: Nonfocal, ambulatory    Data Reviewed: I have independently reviewed following labs and imaging studies   CBC: Recent Labs  Lab 12/26/19 1000 12/27/19 0950 12/28/19 1040 12/29/19 1035 12/30/19 0835 12/31/19 0927 01/01/20 0028  WBC 19.1*   < >  19.3* 15.4* 10.4 10.7* 10.1  NEUTROABS 17.0*  --   --   --   --   --   --   HGB 15.4   < > 15.5 15.0 12.0* 14.2 12.9*  HCT 45.6   < > 45.8 44.8 36.5* 43.2 38.9*  MCV 96.8   < > 97.0 98.0 100.3* 99.1 99.2  PLT 349   < > 351 360 229 303 266   < > = values in this interval not displayed.   Basic Metabolic Panel: Recent Labs  Lab 12/26/19 1000 12/27/19 0950 12/28/19 1040 12/29/19 1035 12/30/19 0835 12/31/19 0927 01/01/20 0028  NA 136   < > 135 135 136 137 136  K 4.9   < > 4.5 3.9 4.2 3.6 4.4  CL 97*   < > 97* 95* 98 97* 97*  CO2 26   < > 27 27 28 30 30   GLUCOSE 145*   < > 139* 94 77 105* 95  BUN 32*   < > 39* 39* 38* 36* 40*  CREATININE 1.10   < > 1.18 1.22 1.03 1.06 0.99  CALCIUM 9.9   < >  10.6* 11.5* 11.1* 10.4* 11.2*  MG 2.3  --   --   --   --   --  2.4  PHOS  --   --   --   --   --   --  4.6   < > = values in this interval not displayed.   GFR: Estimated Creatinine Clearance: 54.5 mL/min (by C-G formula based on SCr of 0.99 mg/dL). Liver Function Tests: Recent Labs  Lab 12/28/19 1040 12/29/19 1035 12/30/19 0835 12/31/19 0927 01/01/20 0028  AST 59* 48* 35 37 31  ALT 106* 80* 63* 54* 49*  ALKPHOS 120 117 108 107 91  BILITOT 1.4* 1.4* 1.1 1.1 0.6  PROT 6.5 6.3* 6.0* 6.3* 5.6*  ALBUMIN 3.3* 3.3* 3.0* 3.0* 2.7*   No results for input(s): LIPASE, AMYLASE in the last 168 hours. No results for input(s): AMMONIA in the last 168 hours. Coagulation Profile: No results for input(s): INR, PROTIME in the last 168 hours. Cardiac Enzymes: No results for input(s): CKTOTAL, CKMB, CKMBINDEX, TROPONINI in the last 168 hours. BNP (last 3 results) No results for input(s): PROBNP in the last 8760 hours. HbA1C: No results for input(s): HGBA1C in the last 72 hours. CBG: No results for input(s): GLUCAP in the last 168 hours. Lipid Profile: No results for input(s): CHOL, HDL, LDLCALC, TRIG, CHOLHDL, LDLDIRECT in the last 72 hours. Thyroid Function Tests: No results for input(s):  TSH, T4TOTAL, FREET4, T3FREE, THYROIDAB in the last 72 hours. Anemia Panel: No results for input(s): VITAMINB12, FOLATE, FERRITIN, TIBC, IRON, RETICCTPCT in the last 72 hours. Urine analysis:    Component Value Date/Time   COLORURINE YELLOW (A) 12/20/2019 1300   APPEARANCEUR CLEAR (A) 12/20/2019 1300   LABSPEC 1.006 12/20/2019 1300   PHURINE 6.0 12/20/2019 1300   GLUCOSEU NEGATIVE 12/20/2019 1300   HGBUR NEGATIVE 12/20/2019 1300   BILIRUBINUR NEGATIVE 12/20/2019 1300   KETONESUR NEGATIVE 12/20/2019 1300   PROTEINUR NEGATIVE 12/20/2019 1300   NITRITE NEGATIVE 12/20/2019 1300   LEUKOCYTESUR NEGATIVE 12/20/2019 1300   Sepsis Labs: Invalid input(s): PROCALCITONIN, LACTICIDVEN  No results found for this or any previous visit (from the past 240 hour(s)).    Radiology Studies: DG CHEST PORT 1 VIEW  Result Date: 12/30/2019 CLINICAL DATA:  Hypoxemia. EXAM: PORTABLE CHEST 1 VIEW COMPARISON:  12/22/2019 and older exams. FINDINGS: Cardiac silhouette normal in size.  No mediastinal or hilar masses. Bilateral interstitial and hazy airspace opacities are unchanged from the most recent prior exam. The nodule noted in the superior segment of the right upper lobe on the CT from 12/23/2019 is not resolved radiographically. No pleural effusion or pneumothorax. Old, healed left rib fractures are stable. IMPRESSION: 1. No radiographic change from the study dated 12/22/2019. 2. Bilateral interstitial and hazy airspace lung opacities. Findings are consistent with atypical infection, specifically COVID-19 pneumonia. Electronically Signed   By: Lajean Manes M.D.   On: 12/30/2019 17:56     Marzetta Board, MD, PhD [facility]  Contact via  [email or web address]  [place] P: [phone] F: [phone]

## 2020-01-01 NOTE — Progress Notes (Signed)
SATURATION QUALIFICATIONS: (This note is used to comply with regulatory documentation for home oxygen)  Patient Saturations on Room Air at Rest = 96%  Patient Saturations on Room Air while standing = 80%  Patient Saturations on 4 Liters of oxygen while Ambulating = 83%  Please briefly explain why patient needs home oxygen:Needs supplemental O2 with any activity to maintain acceptable oxygenation levels.  [place] Pager [phone] Office [phone]

## 2020-01-01 NOTE — Progress Notes (Signed)
Patient states he does not want to be woken up for anything from 2200 on for anything.  He is alert and oriented and states that when/if he needs anything he will use the call bell to request for assistance.  Discussed with patient that vital signs were ordered at midnight and 0400, inhalers at 0200, and ointment for his sores q3hr, and patient voiced understanding but continued to state that he did not want to be bothered with it.  Patient able to demonstrate properly how to use the call bell and I assured him I would respect his wishes and rights as a patient.  Spoke with wife and updated her on patient's progress.  Patient wishes to wear O2 at 2L at HS.  Placed back on O2 via [place].

## 2020-01-01 NOTE — Plan of Care (Signed)
  Problem: Education: Goal: Knowledge of risk factors and measures for prevention of condition will improve Outcome: Progressing   Problem: Coping: Goal: Psychosocial and spiritual needs will be supported Outcome: Progressing   Problem: Respiratory: Goal: Will maintain a patent airway Outcome: Progressing Goal: Complications related to the disease process, condition or treatment will be avoided or minimized Outcome: Progressing   Problem: Education: Goal: Knowledge of General Education information will improve Description: Including pain rating scale, medication(s)/side effects and non-pharmacologic comfort measures Outcome: Progressing   Problem: Health Behavior/Discharge Planning: Goal: Ability to manage health-related needs will improve Outcome: Progressing   Problem: Clinical Measurements: Goal: Ability to maintain clinical measurements within normal limits will improve Outcome: Progressing Goal: Will remain free from infection Outcome: Progressing Goal: Diagnostic test results will improve Outcome: Progressing Goal: Respiratory complications will improve Outcome: Progressing Goal: Cardiovascular complication will be avoided Outcome: Progressing   Problem: Activity: Goal: Risk for activity intolerance will decrease Outcome: Progressing   Problem: Nutrition: Goal: Adequate nutrition will be maintained Outcome: Progressing   Problem: Pain Managment: Goal: General experience of comfort will improve Outcome: Progressing   Problem: Safety: Goal: Ability to remain free from injury will improve Outcome: Progressing   Problem: Skin Integrity: Goal: Risk for impaired skin integrity will decrease Outcome: Progressing

## 2020-01-01 NOTE — Progress Notes (Signed)
Physical Therapy Treatment Patient Details Name: Duane Santos MRN: [id number] DOB: September 21, 1946 Today's Date: 01/01/2020    History of Present Illness Pt adm with acute hypoxic respiratory failure due to covid PNA. Pt also found to have LLE DVT and PE. Pt found to have spiculated lung mass suspicious for malignancy to be worked up as outpatient.  PMH - myasthenia gravis, osteoporosis    PT Comments    Pt continues to make steady progress. Amb on 4L with SpO2 83% (see separate note for O2 qualifications). Pt with faster, smoother gait with rollator but does not want one for home due to he feels house is so small he doesn't need it.    Follow Up Recommendations  Home health PT     Equipment Recommendations  None recommended by PT    Recommendations for Other Services       Precautions / Restrictions Precautions Precautions: Fall Precaution Comments: desat w/ mobility Restrictions Weight Bearing Restrictions: No    Mobility  Bed Mobility               General bed mobility comments: patient up in chair  Transfers Overall transfer level: Modified independent Equipment used: None Transfers: Sit to/from Stand Sit to Stand: Modified independent (Device/Increase time)            Ambulation/Gait Ambulation/Gait assistance: Supervision Gait Distance (Feet): 250 Feet Assistive device: None;4-wheeled walker Gait Pattern/deviations: Step-through pattern;Drifts right/left Gait velocity: decr Gait velocity interpretation: 1.31 - 2.62 ft/sec, indicative of limited community ambulator General Gait Details: assist for safety and line management. Pt with steadier, smoother gait using rollator in the hallwa   Stairs             Wheelchair Mobility    Modified Rankin (Stroke Patients Only)       Balance Overall balance assessment: Mild deficits observed, not formally tested                                          Cognition  Arousal/Alertness: Awake/alert Behavior During Therapy: WFL for tasks assessed/performed Overall Cognitive Status: Within Functional Limits for tasks assessed                                        Exercises      General Comments General comments (skin integrity, edema, etc.): Amb on 4L O2 with SpO2 to 83%.      Pertinent Vitals/Pain Faces Pain Scale: No hurt    Home Living                      Prior Function            PT Goals (current goals can now be found in the care plan section) Acute Rehab PT Goals Patient Stated Goal: To go home Progress towards PT goals: Progressing toward goals    Frequency    Min 3X/week      PT Plan Current plan remains appropriate    Co-evaluation              AM-PAC PT "6 Clicks" Mobility   Outcome Measure  Help needed turning from your back to your side while in a flat bed without using bedrails?: None Help needed moving from lying on your back to sitting on  the side of a flat bed without using bedrails?: None Help needed moving to and from a bed to a chair (including a wheelchair)?: A Little Help needed standing up from a chair using your arms (e.g., wheelchair or bedside chair)?: None Help needed to walk in hospital room?: A Little Help needed climbing 3-5 steps with a railing? : A Little 6 Click Score: 21    End of Session Equipment Utilized During Treatment: Oxygen Activity Tolerance: Patient limited by fatigue Patient left: in chair;with call bell/phone within reach Nurse Communication: Mobility status PT Visit Diagnosis: Other abnormalities of gait and mobility (R26.89)     Time: 0981-1914 PT Time Calculation (min) (ACUTE ONLY): 25 min  Charges:  $Gait Training: 23-37 mins                     [place] Pager [phone] Office [place] 01/01/2020, 12:01 PM

## 2020-01-01 NOTE — Progress Notes (Signed)
Patient completed NIF and VC with RT with good efforts.  NIF > - 40 VC 1325 mL

## 2020-01-01 NOTE — TOC Progression Note (Signed)
Transition of Care [facility]) - Progression Note    Patient Details  Name: Duane Santos MRN: [id number] Date of Birth: 1946-08-13  [facility]) CM/SW Contact  Bartholomew Crews, RN Phone Number: [phone] 01/01/2020, 12:17 PM  Clinical Narrative:    Request to compare cost for eliquis and xarelto. Unfortunately cannot be determined on the weekend. Request for benefits check submitted. TOC following.    Expected Discharge Plan: [place] Barriers to Discharge: Continued Medical Work up  Expected Discharge Plan and Services Expected Discharge Plan: [place] In-house Referral: Clinical Social Work   Post Acute Care Choice: [place] arrangements for the past 2 months: [place]: [facility] Date [place]: 12/30/19 Time [place]: 4174 Representative spoke with at [place]: [place] ([place]) Interventions    Readmission Risk Interventions Readmission Risk Prevention Plan 12/30/2019  Transportation Screening Complete  Home Care Screening Complete  Medication Review (RN CM) Complete  Some recent data might be hidden

## 2020-01-01 NOTE — Progress Notes (Signed)
RT NOTE:  Pt completed both NIF/VC with good effort  NIF > -40 VC 1250

## 2020-01-02 LAB — CBC
HCT: 43.1 % (ref 39.0–52.0)
Hemoglobin: 14.5 g/dL (ref 13.0–17.0)
MCH: 33.6 pg (ref 26.0–34.0)
MCHC: 33.6 g/dL (ref 30.0–36.0)
MCV: 99.8 fL (ref 80.0–100.0)
Platelets: 288 10*3/uL (ref 150–400)
RBC: 4.32 MIL/uL (ref 4.22–5.81)
RDW: 15.8 % — ABNORMAL HIGH (ref 11.5–15.5)
WBC: 12.2 10*3/uL — ABNORMAL HIGH (ref 4.0–10.5)
nRBC: 0 % (ref 0.0–0.2)

## 2020-01-02 LAB — COMPREHENSIVE METABOLIC PANEL
ALT: 58 U/L — ABNORMAL HIGH (ref 0–44)
AST: 42 U/L — ABNORMAL HIGH (ref 15–41)
Albumin: 3 g/dL — ABNORMAL LOW (ref 3.5–5.0)
Alkaline Phosphatase: 102 U/L (ref 38–126)
Anion gap: 10 (ref 5–15)
BUN: 29 mg/dL — ABNORMAL HIGH (ref 8–23)
CO2: 29 mmol/L (ref 22–32)
Calcium: 10.5 mg/dL — ABNORMAL HIGH (ref 8.9–10.3)
Chloride: 99 mmol/L (ref 98–111)
Creatinine, Ser: 1.07 mg/dL (ref 0.61–1.24)
GFR calc Af Amer: >60 mL/min (ref >60)
GFR calc non Af Amer: >60 mL/min (ref >60)
Glucose, Bld: 122 mg/dL — ABNORMAL HIGH (ref 70–99)
Potassium: 3.5 mmol/L (ref 3.5–5.1)
Sodium: 138 mmol/L (ref 135–145)
Total Bilirubin: 0.8 mg/dL (ref 0.3–1.2)
Total Protein: 6.3 g/dL — ABNORMAL LOW (ref 6.5–8.1)

## 2020-01-02 LAB — C-REACTIVE PROTEIN: CRP: 0.5 mg/dL (ref <1.0)

## 2020-01-02 MED ORDER — PREDNISONE 20 MG PO TABS
60.0000 mg | ORAL_TABLET | Freq: Every day | ORAL | Status: DC
  Administered 2020-01-02 – 2020-01-03 (×2): 60 mg via ORAL
  Filled 2020-01-02 (×2): qty 3

## 2020-01-02 NOTE — Progress Notes (Signed)
Patient states he does not want to be woken up for anything from 2200 on for anything. He is alert and oriented and states that when/if he needs anything he will use the call bell to request for assistance. Discussed with patient that vital signs were ordered at midnight and 0400, inhalers at 0200, and ointment for his sores q3hr, and patient voiced understanding but continued to state that he did not want to be bothered with it. Patient able to demonstrate properly how to use the call bell and I assured him I would respect his wishes and rights as a patient.  Patient wishes to wear O2 at 2L at HS.  Placed back on O2 via [place].

## 2020-01-02 NOTE — Progress Notes (Signed)
PROGRESS NOTE  Duane Santos LZJ:[id number] DOB: 05/09/1946 DOA: 12/21/2019 PCP: [place]   LOS: 12 days   Brief Narrative / Interim history: 74 year old male with history of myasthenia gravis on prednisone, hyperlipidemia, tested positive for Covid in January 4 and has been having persistent fevers and shortness of breath, and his symptoms progressed and he was admitted to the hospital on 12/21/2019.  He was quite hypoxic initially requiring 15 L high flow nasal cannula.  He was treated with steroids, remdesivir, convalescent plasma as well as Actemra.  He underwent a CT angiogram on admission which was positive for PE, he was also diagnosed with acute DVT.  CT scan also showed a spiculated solid mass in the superior segment of the right lower lobe, suspicious for malignancy.  Subjective / 24h Interval events: Feeling tired, overall improved.   Assessment & Plan:  Principal Problem Acute Hypoxic Respiratory Failure due to Covid-19 Viral Illness -Initially requiring 15 L high flow nasal cannula and now able to be weaned off and currently on 3-4 L nasal cannula with ambulation in room air at rest. -Patient has poor reserves the setting quickly upon any activity -Continue steroids -Completed remdesivir while hospitalized -He is status post convalescent plasma on 1/12 and received Actemra on 1/13 -Continue to monitor inflammatory markers -Given prolonged hypoxia and overall stability on 4 L nasal cannula along with the presence of PE and bilateral interstitial opacities throughout the lung, along with underlying myasthenia gravis, it is possible that his hypoxia may be present for several weeks and patient is very likely that he will go home on oxygen -improving, anticipate home tomorrow   COVID-19 Labs  Recent Labs    01/01/20 0028 01/02/20 1005  DDIMER 12.75*  --   CRP 0.6 0.5   Active Problems Acute left DVT/PE -With significantly elevated D-dimer, this is due to  COVID-19 -He was initially on Lovenox and has been transitioned to Eliquis, will need 10 mg twice daily for 7 days and on 1/24 he will be transitioned to 5 mg twice daily -Discontinue aspirin given nosebleed this morning  Epistaxis -Likely in the setting of oxygen use as well as anticoagulation with Eliquis -He is also on aspirin but does not have real indication this he denies any history of MI/CVA.  Discontinue aspirin -Added Afrin -no further bleeding overnight   Myasthenia gravis -Patient on prednisone and CellCept at home, oral prednisone is now on hold as patient is on intravenous steroids -Continue to hold CellCept for now.  I was able to discuss with the patient's primary neurologist over the phone, and she recommends to discharge patient on 60 mg of prednisone daily and taper 10 mg every 4 days up to 30 mg, and maintained on 30 mg until she sees him in office in couple of weeks.  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 on 01/06/2020  Herpes labialis -Received Valtrex, on acyclovir ointment  Hypercalcemia/osteoporosis -Discontinue calcium supplements.  I wonder whether the spiculated mass is contributing   Scheduled Meds: . acyclovir ointment   Topical Q3H  . albuterol  2 puff Inhalation Q6H  . apixaban  5 mg Oral BID  . vitamin C  500 mg Oral Daily  . docusate sodium  100 mg Oral BID  .  famotidine  20 mg Oral Daily  . feeding supplement (GLUCERNA SHAKE)  237 mL Oral QID  . folic acid  1 mg Oral Daily  . multivitamin with minerals  1 tablet Oral QODAY  . oxymetazoline  1 spray Each Nare BID  . predniSONE  60 mg Oral Q breakfast  . thiamine  100 mg Oral Daily  . zinc sulfate  220 mg Oral Daily   Continuous Infusions: PRN  Meds:.acetaminophen, chlorpheniramine-HYDROcodone, guaiFENesin-dextromethorphan, lip balm, Melatonin, ondansetron **OR** ondansetron (ZOFRAN) IV, phenol, sodium chloride  DVT prophylaxis: Eliquis Code Status: DNR Family Communication: updated [name] [phone]  Patient admitted from: home Anticipated d/c place: home Barriers to d/c: Hypoxia improving, anticipate home discharge within 24 hours pending improvement in his epistaxis  Consultants:  None  Procedures:  none  Microbiology: none  Antimicrobials: none   Objective: Vitals:   01/01/20 2205 01/02/20 0530 01/02/20 0730 01/02/20 1132  BP:  129/75 110/61 120/83  Pulse: 66 78 93 89  Resp:  20 18 16   Temp:  98 F (36.7 C) 98 F (36.7 C) 97.8 F (36.6 C)  TempSrc:  Oral Oral Oral  SpO2: 95% 94% 90% 93%  Weight:      Height:        Intake/Output Summary (Last 24 hours) at 01/02/2020 1326 Last data filed at 01/02/2020 0741 Gross per 24 hour  Intake 360 ml  Output 1150 ml  Net -790 ml   Filed Weights   12/21/19 2044  Weight: 57.1 kg    Examination:  Constitutional: NAD, eating breakfast  Eyes: no icterus  ENMT: mmm Neck: normal, supple Respiratory: clear, no wheezing  Cardiovascular: RRR, no edema  Abdomen: soft, NT, ND Musculoskeletal: no clubbing / cyanosis.  Skin: no rashes seen  Neurologic: non focal   Data Reviewed: I have independently reviewed following labs and imaging studies   CBC: Recent Labs  Lab 12/29/19 1035 12/30/19 0835 12/31/19 0927 01/01/20 0028 01/02/20 1005  WBC 15.4* 10.4 10.7* 10.1 12.2*  HGB 15.0 12.0* 14.2 12.9* 14.5  HCT 44.8 36.5* 43.2 38.9* 43.1  MCV 98.0 100.3* 99.1 99.2 99.8  PLT 360 229 303 266 903   Basic Metabolic Panel: Recent Labs  Lab 12/29/19 1035 12/30/19 0835 12/31/19 0927 01/01/20 0028 01/02/20 1005  NA 135 136 137 136 138  K 3.9 4.2 3.6 4.4 3.5  CL 95* 98 97* 97* 99  CO2 27 28 30 30 29   GLUCOSE 94 77 105* 95 122*  BUN 39* 38* 36* 40*  29*  CREATININE 1.22 1.03 1.06 0.99 1.07  CALCIUM 11.5* 11.1* 10.4* 11.2* 10.5*  MG  --   --   --  2.4  --   PHOS  --   --   --  4.6  --    GFR: Estimated Creatinine Clearance: 50.4 mL/min (by C-G formula based on SCr of 1.07 mg/dL). Liver Function Tests: Recent Labs  Lab 12/29/19 1035 12/30/19 0835 12/31/19 0927 01/01/20 0028 01/02/20 1005  AST 48* 35 37 31 42*  ALT 80* 63* 54* 49* 58*  ALKPHOS 117 108 107 91 102  BILITOT 1.4* 1.1 1.1 0.6 0.8  PROT 6.3* 6.0* 6.3* 5.6* 6.3*  ALBUMIN 3.3* 3.0* 3.0* 2.7* 3.0*   No results for input(s): LIPASE, AMYLASE in the last 168 hours. No results for input(s): AMMONIA in the last 168 hours. Coagulation Profile: No results for input(s): INR, PROTIME in the last 168 hours. Cardiac Enzymes: No results for input(s): CKTOTAL, CKMB, CKMBINDEX, TROPONINI in  the last 168 hours. BNP (last 3 results) No results for input(s): PROBNP in the last 8760 hours. HbA1C: No results for input(s): HGBA1C in the last 72 hours. CBG: No results for input(s): GLUCAP in the last 168 hours. Lipid Profile: No results for input(s): CHOL, HDL, LDLCALC, TRIG, CHOLHDL, LDLDIRECT in the last 72 hours. Thyroid Function Tests: No results for input(s): TSH, T4TOTAL, FREET4, T3FREE, THYROIDAB in the last 72 hours. Anemia Panel: No results for input(s): VITAMINB12, FOLATE, FERRITIN, TIBC, IRON, RETICCTPCT in the last 72 hours. Urine analysis:    Component Value Date/Time   COLORURINE YELLOW (A) 12/20/2019 1300   APPEARANCEUR CLEAR (A) 12/20/2019 1300   LABSPEC 1.006 12/20/2019 1300   PHURINE 6.0 12/20/2019 1300   GLUCOSEU NEGATIVE 12/20/2019 1300   HGBUR NEGATIVE 12/20/2019 1300   BILIRUBINUR NEGATIVE 12/20/2019 1300   KETONESUR NEGATIVE 12/20/2019 1300   PROTEINUR NEGATIVE 12/20/2019 1300   NITRITE NEGATIVE 12/20/2019 1300   LEUKOCYTESUR NEGATIVE 12/20/2019 1300   Sepsis Labs: Invalid input(s): PROCALCITONIN, LACTICIDVEN  No results found for this or any  previous visit (from the past 240 hour(s)).    Radiology Studies: No results found.   Marzetta Board, MD, PhD [facility]  Contact via  [email or web address]  [place] P: [phone] F: [phone]

## 2020-01-02 NOTE — Plan of Care (Signed)
  Problem: Education: Goal: Knowledge of risk factors and measures for prevention of condition will improve Outcome: Progressing   Problem: Coping: Goal: Psychosocial and spiritual needs will be supported Outcome: Progressing   Problem: Respiratory: Goal: Will maintain a patent airway Outcome: Progressing Goal: Complications related to the disease process, condition or treatment will be avoided or minimized Outcome: Progressing   Problem: Education: Goal: Knowledge of General Education information will improve Description: Including pain rating scale, medication(s)/side effects and non-pharmacologic comfort measures Outcome: Progressing   Problem: Health Behavior/Discharge Planning: Goal: Ability to manage health-related needs will improve Outcome: Progressing   Problem: Clinical Measurements: Goal: Ability to maintain clinical measurements within normal limits will improve Outcome: Progressing Goal: Will remain free from infection Outcome: Progressing Goal: Diagnostic test results will improve Outcome: Progressing Goal: Respiratory complications will improve Outcome: Progressing Goal: Cardiovascular complication will be avoided Outcome: Progressing   Problem: Activity: Goal: Risk for activity intolerance will decrease Outcome: Progressing   Problem: Nutrition: Goal: Adequate nutrition will be maintained Outcome: Progressing   Problem: Pain Managment: Goal: General experience of comfort will improve Outcome: Progressing   Problem: Safety: Goal: Ability to remain free from injury will improve Outcome: Progressing   Problem: Skin Integrity: Goal: Risk for impaired skin integrity will decrease Outcome: Progressing

## 2020-01-03 DIAGNOSIS — R0902 Hypoxemia: Secondary | ICD-10-CM

## 2020-01-03 MED ORDER — ACYCLOVIR 5 % EX OINT: TOPICAL_OINTMENT | CUTANEOUS | 0 refills | Status: DC

## 2020-01-03 MED ORDER — OXYMETAZOLINE HCL 0.05 % NA SOLN: 1.0000 | Freq: Two times a day (BID) | NASAL | 0 refills | Status: DC | PRN

## 2020-01-03 MED ORDER — PREDNISONE 10 MG PO TABS: 60.0000 mg | ORAL_TABLET | Freq: Every day | ORAL | 1 refills | Status: DC

## 2020-01-03 MED ORDER — GUAIFENESIN-DM 100-10 MG/5ML PO SYRP: 10.0000 mL | ORAL_SOLUTION | ORAL | 0 refills | Status: DC | PRN

## 2020-01-03 MED ORDER — APIXABAN 5 MG PO TABS: 5.0000 mg | ORAL_TABLET | Freq: Two times a day (BID) | ORAL | 1 refills | Status: DC

## 2020-01-03 NOTE — Progress Notes (Signed)
SATURATION QUALIFICATIONS: (This note is used to comply with regulatory documentation for home oxygen)  Patient Saturations on Room Air at Rest = 86%  Patient Saturations on Room Air while Ambulating = NA%  Patient Saturations on 3 Liters of oxygen while Ambulating = 89-94%  Please briefly explain why patient needs home oxygen: Pt needs supplemental oxygen to maintain saturations at 88% and above while ambulating.

## 2020-01-03 NOTE — Progress Notes (Signed)
Ambulation Note  Saturation Pre: 94% on 3L  Ambulation Distance: 300 ft  Saturation During Ambulation: 89-94% on 3L  Notes: Pt used RW. Tolerated well. Pt returned to recliner with call bell within reach, sats were 94% on 3L.  Philis Kendall, MS, ACSM CEP 11:35 AM 01/03/2020

## 2020-01-03 NOTE — TOC Benefit Eligibility Note (Signed)
Transition of Care [facility]) Benefit Eligibility Note    Patient Details  Name: Duane Santos MRN: [id number] Date of Birth: 11/29/1946   Medication/Dose: Arne [place]  2.5 MG  CO-PAY- $30.00  and  ELIQUIS  5 MG BID , CO-PAY- $30.00  Covered?: Yes  Tier: 3 Drug  Prescription Coverage Preferred Pharmacy: [facility] with Person/Company/Phone Number:: IAN  @ [employer] [facility] # [phone]  Co-Pay: $30.00  Prior Approval: No     Additional Notes: XARELTO 15 MG BID , CO-PAY- $30.00   and  XARELTO 20 MG DAILY, CO-PAY- $30.00    Memory Argue Phone Number: 01/03/2020, 12:41 PM

## 2020-01-03 NOTE — Progress Notes (Signed)
NIF -40 cmh2o great effort

## 2020-01-03 NOTE — Care Management Important Message (Signed)
Important Message  Patient Details  Name: Duane Santos MRN: [id number] Date of Birth: 1946-10-13   Medicare Important Message Given:  Yes - Important Message mailed due to current National Emergency  Verbal consent obtained due to current National Emergency  Relationship to patient: Spouse/Significant Other Contact Name: [name] Call Date: 01/03/20  Time: 1248 Phone: [phone] Outcome: Spoke with contact Important Message mailed to: Other (must enter comment)(declined an additional copy of IM but is aware one is available if needed)    Delorse Lek 01/03/2020, 12:50 PM

## 2020-01-03 NOTE — TOC Progression Note (Signed)
Transition of Care [facility]) - Progression Note    Patient Details  Name: Duane Santos MRN: [id number] Date of Birth: Jul 12, 1946  [facility] of [facility]) CM/SW Contact  Joaquin Courts, RN Phone Number: 01/03/2020, 11:56 AM  Clinical Narrative:    Patient set up with [employer] for home O2.  Agency will deliver concentrator to home, [facility] to provide portable O2 tank for dc home.  Patient's spouse will transport patient home.  CM awaiting confirmation from [place] that concentrator has been delivered, once confirmation received pt can dc.    Expected Discharge Plan: [place] Barriers to Discharge: Continued Medical Work up  Expected Discharge Plan and Services Expected Discharge Plan: [place] In-house Referral: Clinical Social Work Discharge Planning Services: CM Consult Post Acute Care Choice: [place] arrangements for the past 2 months: Single Family Home Expected Discharge Date: 01/03/20               DME Arranged: Oxygen DME Agency: [facility] Date DME Agency Contacted: 01/03/20 Time DME Agency Contacted: [phone] Representative spoke with at DME Agency: [place]   [place]: [facility] Date [place]: 12/30/19 Time [place]: [place] Representative spoke with at [place]: [place] ([place]) Interventions    Readmission Risk Interventions Readmission Risk Prevention Plan 12/30/2019  Transportation Screening Complete  Home Care Screening Complete  Medication Review (RN CM) Complete  Some recent data might be hidden

## 2020-01-03 NOTE — Progress Notes (Signed)
Walked patient down to meet wife via wheelchair with 3LNC of O2.  Hooked patient up to O2 that wife had in the car. Went over DC instructions and prescriptions with her.  States she understands all the DC instructions and doesn't have any questions at this time. Showed her the phone numbers on her DC instructions that she could call if she does have questions.

## 2020-01-03 NOTE — Progress Notes (Signed)
Midline removed. Occulsive dressing applied.  Discussed DC teaching with patient, will do it again with wife when she comes to pick him up.  Wife on the way. Has concentrator and O2 tanks at home. No distress noted at this time.

## 2020-01-03 NOTE — Discharge Summary (Signed)
Physician Discharge Summary  [name] JGG:[id number] DOB: [birth year] DOA: 12/21/2019  PCP: [facility] date: 12/21/2019 Discharge date: 01/03/2020  Admitted From: home Disposition:  home  Recommendations for Outpatient Follow-up:  1. Follow up with PCP in 1-2 weeks 2. Follow-up with [place] neurology in 2 weeks 3. Follow-up with pulmonology either in [place] or at [facility] in 1 to 2 months  Home Health: PT, OT Equipment/Devices: Home oxygen  Discharge Condition: Stable CODE STATUS: DNR Diet recommendation: Regular diet  HPI: Per admitting MD, Duane Santos is a 74 y.o. male with medical history significant of myasthenia gravis on prednisone, mixed hyperlipidemia, and 0 cell carcinoma reports being Covid positive on January 4 has been having persistent fever and shortness of breath.  He feels more short of breath today and his wife decided to call EMS. ED Course: He arrived per EMS on 2 L nasal cannula satting 84% and increased to 4 L resulting in 93%.  Mildly tachypneic.  T-max of 100.3, blood pressure 118/80, respiratory rate was 29 with pulse of 92.  Chest x-ray revealed ill-defined airspace opacity bilaterally without consolidation suspecting atypical organism multifocal pneumonia.  Urine cultures were obtained in the ED.  Hospital Course / Discharge diagnoses: Principal problem Acute Hypoxic Respiratory Failure due to Covid-19 Viral Illness -patient was admitted to the hospital with COVID-19 pneumonia and respiratory failure, initially requiring 15 L high flow nasal cannula.  She received remdesivir, steroids, Actemra as well as convalescent plasma with improvement in his respiratory status.  He is much stronger, breathing comfortably, able to ambulate without difficulties, but still requiring 2 to 4 L nasal cannula.  He will be discharged home in stable condition with home oxygen.  His inflammatory markers have improved  Active Problems Acute left DVT/PE -With  significantly elevated D-dimer, this is due to COVID-19 He was initially on Lovenox and has been transitioned to Eliquis, completed a week of 10 mg twice daily and will be transition to 5 mg twice daily.  He did have a nosebleed which is now resolved.  His aspirin has been discontinued. Epistaxis -Likely in the setting of oxygen use as well as anticoagulation with Eliquis. Added Afrin, resolved, no further bleeding Myasthenia gravis -Patient on prednisone and CellCept at home. Continue to hold CellCept for now.  I was able to discuss with the patient's primary neurologist over the phone, and she recommends to discharge patient on 60 mg of prednisone daily and taper 10 mg every 4 days up to 30 mg, and maintained on 30 mg until she sees him in office in couple of weeks.  She also recommends to hold CellCept until he is seen in office Spiculated lung mass -Highly suspicious for cancer, patient has a follow-up with oncology as an outpatient in [place] on 10/30 a.m. 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  I wonder whether the spiculated mass is contributing. Monitor calcium as an outpatient.  Discharge Instructions   Allergies as of 01/03/2020   No Known Allergies     Medication List    STOP taking these medications   aspirin 81 MG EC tablet   calcium-vitamin D 500-200 MG-UNIT tablet Commonly known as: OSCAL WITH D   chlorpheniramine-HYDROcodone 10-8 MG/5ML Suer Commonly known as: TUSSIONEX   dexamethasone 10 MG/ML injection Commonly known as: DECADRON   enoxaparin 40 MG/0.4ML injection Commonly known as: LOVENOX   folic acid 1 MG tablet Commonly known as: [employer]  ibuprofen 200 MG tablet Commonly known as: ADVIL   insulin aspart 100 UNIT/ML injection Commonly known as: novoLOG   mycophenolate 250 MG capsule Commonly known as: CELLCEPT     TAKE these medications   acetaminophen 325 MG  tablet Commonly known as: TYLENOL Take 2 tablets (650 mg total) by mouth every 6 (six) hours as needed for fever or mild pain.   acyclovir ointment 5 % Commonly known as: ZOVIRAX Apply topically every 3 (three) hours.   albuterol 108 (90 Base) MCG/ACT inhaler Commonly known as: VENTOLIN HFA Inhale 2 puffs into the lungs every 6 (six) hours.   alendronate 70 MG tablet Commonly known as: FOSAMAX Take 70 mg by mouth once a week.   apixaban 5 MG Tabs tablet Commonly known as: ELIQUIS Take 1 tablet (5 mg total) by mouth 2 (two) times daily.   ascorbic acid 500 MG tablet Commonly known as: VITAMIN C Take 1 tablet (500 mg total) by mouth daily.   B-complex with vitamin C tablet Take 1 tablet by mouth daily.   Garlic 1093 MG Caps Take 1,000 mg by mouth daily.   guaiFENesin-dextromethorphan 100-10 MG/5ML syrup Commonly known as: ROBITUSSIN DM Take 10 mLs by mouth every 4 (four) hours as needed for cough.   Melatonin 3 MG Tabs Take 3 mg by mouth at bedtime as needed (sleep).   multivitamin with minerals Tabs tablet Take 1 tablet by mouth every other day.   Omega-3 1000 MG Caps Take 1 capsule by mouth 2 (two) times daily.   ondansetron 4 MG tablet Commonly known as: ZOFRAN Take 1 tablet (4 mg total) by mouth every 6 (six) hours as needed for nausea.   oxymetazoline 0.05 % nasal spray Commonly known as: AFRIN Place 1 spray into both nostrils 2 (two) times daily as needed (nose bleeding).   predniSONE 10 MG tablet Commonly known as: DELTASONE Take 6 tablets (60 mg total) by mouth daily. 60 mg for 4 days then 50 mg for 4 days then 40 mg for 4 days then take 30 mg daily until you see your neurologist What changed:   medication strength  how much to take  when to take this  additional instructions  Another medication with the same name was removed. Continue taking this medication, and follow the directions you see here.   testosterone 50 MG/5GM (1%) Gel Commonly  known as: ANDROGEL Place 5 g onto the skin every other day.   thiamine 100 MG tablet Take 1 tablet (100 mg total) by mouth daily.   Turmeric 500 MG Caps Take 1 capsule by mouth daily.   vitamin E 180 MG (400 UNITS) capsule Generic drug: vitamin E Take 400 Units by mouth daily.   zinc sulfate 220 (50 Zn) MG capsule Take 1 capsule (220 mg total) by mouth daily.            Durable Medical Equipment  (From admission, onward)         Start     Ordered   01/03/20 0956  For home use only DME oxygen  Once    Question Answer Comment  Length of Need 6 Months   Mode or (Route) Nasal cannula   Liters per Minute 4   Frequency Continuous (stationary and portable oxygen unit needed)   Oxygen delivery system Gas      01/03/20 0955         Follow-up Information    [facility].   Specialty: Home Health  Services Contact information: [address] [place] [address] [phone]        Mannam, Hart Robinsons, MD. Schedule an appointment as soon as possible for a visit in 1 month(s).   Specialty: Pulmonary Disease Why: follow up for covid 19 pneumonia Contact information: [address] [place] [address] [phone]           Consultations:  None   Procedures/Studies:  CT ANGIO CHEST PE W OR WO CONTRAST  Result Date: 12/23/2019 CLINICAL DATA:  Shortness of breath. Covid 19. hypoxic, D Dimer >20 EXAM: CT ANGIOGRAPHY CHEST WITH CONTRAST TECHNIQUE: Multidetector CT imaging of the chest was performed using the standard protocol during bolus administration of intravenous contrast. Multiplanar CT image reconstructions and MIPs were obtained to evaluate the vascular anatomy. CONTRAST:  97mL OMNIPAQUE IOHEXOL 350 MG/ML SOLN COMPARISON:  12/22/2019 chest x-ray FINDINGS: Cardiovascular: There is atherosclerotic calcification of the coronary vessels and thoracic aorta. No aneurysm. The pulmonary artery to the superior segment of the RIGHT  LOWER lobe is occluded with thrombus. Focal thrombus is identified within the RIGHT UPPER lobe pulmonary artery branch. Small subsegmental pulmonary emboli would be difficult to exclude due to technique. Mediastinum/Nodes: Small hiatal hernia. No significant mediastinal, hilar, or axillary adenopathy. The visualized portion of the thyroid gland has a normal appearance. Lungs/Pleura: Spiculated solid mass is identified within the superior segment of the RIGHT LOWER lobe and measures 2.3 x 2.1 centimeters. LEFT UPPER lobe pulmonary nodule is 4 millimeters on image 44 of series 6. There is diffuse bilateral septal wall thickening. Opacities relatively spare the lung apices and superior segments of the LOWER lobes. Some confluent opacities are identified posteriorly. No pleural effusions. Architectural distortion identified at both lung bases, LEFT greater than RIGHT. Upper Abdomen: No acute abnormality. Musculoskeletal: There is sclerosis not associated with deformity of the RIGHT third, fourth, and 5th ribs. Deformity of LEFT third and fourth ribs consistent prior fractures. Compression fractures of T3, T5, T6, and T12. Review of the MIP images confirms the above findings. IMPRESSION: 1. Study is positive for pulmonary emboli involving the superior segment RIGHT LOWER pulmonary artery and RIGHT UPPER lobe pulmonary artery. 2. Spiculated solid mass within the superior segment of the RIGHT LOWER lobe, suspicious for malignancy. 3. A 4 mm LEFT UPPER lobe pulmonary nodule. 4. Bilateral septal wall thickening and confluent interstitial opacities throughout the lungs. 5. Architectural distortion at both lung bases, LEFT greater than RIGHT. 6. Sclerotic changes of the RIGHT third, fourth, and 5th ribs, suspicious for metastatic disease. 7. Small hiatal hernia. 8. Coronary artery disease. 9. Aortic Atherosclerosis (ICD10-I70.0). 10. Numerous thoracic compression fractures. These results were called by telephone at the time of  interpretation on 12/23/2019 at 9:36 am to provider [facility] , who verbally acknowledged these results. Electronically Signed   By: Nolon Nations M.D.   On: 12/23/2019 09:37   DG CHEST PORT 1 VIEW  Result Date: 12/30/2019 CLINICAL DATA:  Hypoxemia. EXAM: PORTABLE CHEST 1 VIEW COMPARISON:  12/22/2019 and older exams. FINDINGS: Cardiac silhouette normal in size.  No mediastinal or hilar masses. Bilateral interstitial and hazy airspace opacities are unchanged from the most recent prior exam. The nodule noted in the superior segment of the right upper lobe on the CT from 12/23/2019 is not resolved radiographically. No pleural effusion or pneumothorax. Old, healed left rib fractures are stable. IMPRESSION: 1. No radiographic change from the study dated 12/22/2019. 2. Bilateral interstitial and hazy airspace lung opacities. Findings  are consistent with atypical infection, specifically COVID-19 pneumonia. Electronically Signed   By: Lajean Manes M.D.   On: 12/30/2019 17:56   PCXR COVID AM  Result Date: 12/22/2019 CLINICAL DATA:  COVID-19 positive. EXAM: PORTABLE CHEST 1 VIEW COMPARISON:  December 20, 2019. FINDINGS: The heart size and mediastinal contours are within normal limits. No pneumothorax pleural effusion is noted. Old left rib fractures are noted. Mild interstitial densities are noted throughout both lungs suggesting atypical infection. IMPRESSION: Mild interstitial densities are noted throughout both lungs suggesting atypical infection. Followup radiographs are recommended until resolution. No pneumothorax is noted. Electronically Signed   By: Marijo Conception M.D.   On: 12/22/2019 09:11   DG Chest Port 1 View  Result Date: 12/20/2019 CLINICAL DATA:  COVID-19 positive with fever EXAM: PORTABLE CHEST 1 VIEW COMPARISON:  None. FINDINGS: There is ill-defined airspace opacity throughout portions of each upper lobe as well as to a lesser degree in the right lower lung region. No consolidation.  Heart size and pulmonary vascularity are normal. No adenopathy. There old healed rib fractures on the left with remodeling. There is mild thoracolumbar levoscoliosis. No pneumothorax. IMPRESSION: Ill-defined airspace opacity bilaterally, more on the right than on the left, without consolidation. Suspect atypical organism multifocal pneumonia. Cardiac silhouette normal.  No adenopathy. Electronically Signed   By: Lowella Grip III M.D.   On: 12/20/2019 12:33   VAS [place] LOWER EXTREMITY VENOUS (DVT)  Result Date: 12/24/2019  Lower Venous Study Indications: Elevated d-dimer. COVID positive.  Comparison Study: No prior study. Performing Technologist: Maudry Mayhew MHA, RDMS, RVT, RDCS  Examination Guidelines: A complete evaluation includes B-mode imaging, spectral Doppler, color Doppler, and power Doppler as needed of all accessible portions of each vessel. Bilateral testing is considered an integral part of a complete examination. Limited examinations for reoccurring indications may be performed as noted.  +---------+---------------+---------+-----------+----------+--------------+ RIGHT    CompressibilityPhasicitySpontaneityPropertiesThrombus Aging +---------+---------------+---------+-----------+----------+--------------+ CFV      Full           Yes      Yes                                 +---------+---------------+---------+-----------+----------+--------------+ SFJ      Full                                                        +---------+---------------+---------+-----------+----------+--------------+ FV Prox  Full                                                        +---------+---------------+---------+-----------+----------+--------------+ FV Mid   Full                                                        +---------+---------------+---------+-----------+----------+--------------+ FV DistalFull                                                         +---------+---------------+---------+-----------+----------+--------------+  PFV      Full                                                        +---------+---------------+---------+-----------+----------+--------------+ POP      Full           Yes      Yes                                 +---------+---------------+---------+-----------+----------+--------------+ PTV      Full                                                        +---------+---------------+---------+-----------+----------+--------------+ PERO     Full                                                        +---------+---------------+---------+-----------+----------+--------------+   +---------+---------------+---------+-----------+----------+--------------+ LEFT     CompressibilityPhasicitySpontaneityPropertiesThrombus Aging +---------+---------------+---------+-----------+----------+--------------+ CFV      Full           Yes      Yes                                 +---------+---------------+---------+-----------+----------+--------------+ SFJ      Full                                                        +---------+---------------+---------+-----------+----------+--------------+ FV Prox  Full                                                        +---------+---------------+---------+-----------+----------+--------------+ FV Mid   Full                                                        +---------+---------------+---------+-----------+----------+--------------+ FV DistalFull                                                        +---------+---------------+---------+-----------+----------+--------------+ PFV      Full                                                        +---------+---------------+---------+-----------+----------+--------------+  POP      Full           Yes      Yes                                  +---------+---------------+---------+-----------+----------+--------------+ PTV      None                    No                   Acute          +---------+---------------+---------+-----------+----------+--------------+ PERO     Full                                                        +---------+---------------+---------+-----------+----------+--------------+     Summary: Right: There is no evidence of deep vein thrombosis in the lower extremity. No cystic structure found in the popliteal fossa. Left: Findings consistent with acute deep vein thrombosis involving the left posterior tibial veins. No cystic structure found in the popliteal fossa.  *See table(s) above for measurements and observations. Electronically signed by Monica Martinez MD on 12/24/2019 at 3:33:59 PM.    Final      Subjective: - no chest pain, shortness of breath, no abdominal pain, nausea or vomiting.   Discharge Exam: BP 123/83 (BP Location: Right Arm)   Pulse 86   Temp 97.8 F (36.6 C) (Oral)   Resp 20   Ht 5\' 6"  (1.676 m)   Wt 57.1 kg   SpO2 92%   BMI 20.32 kg/m   General: Pt is alert, awake, not in acute distress Cardiovascular: RRR, S1/S2 +, no rubs, no gallops Respiratory: CTA bilaterally, no wheezing, no rhonchi Abdominal: Soft, NT, ND, bowel sounds + Extremities: no edema, no cyanosis    The results of significant diagnostics from this hospitalization (including imaging, microbiology, ancillary and laboratory) are listed below for reference.     Microbiology: No results found for this or any previous visit (from the past 240 hour(s)).   Labs: Basic Metabolic Panel: Recent Labs  Lab 12/29/19 1035 12/30/19 0835 12/31/19 0927 01/01/20 0028 01/02/20 1005  NA 135 136 137 136 138  K 3.9 4.2 3.6 4.4 3.5  CL 95* 98 97* 97* 99  CO2 27 28 30 30 29   GLUCOSE 94 77 105* 95 122*  BUN 39* 38* 36* 40* 29*  CREATININE 1.22 1.03 1.06 0.99 1.07  CALCIUM 11.5* 11.1* 10.4* 11.2* 10.5*  MG   --   --   --  2.4  --   PHOS  --   --   --  4.6  --    Liver Function Tests: Recent Labs  Lab 12/29/19 1035 12/30/19 0835 12/31/19 0927 01/01/20 0028 01/02/20 1005  AST 48* 35 37 31 42*  ALT 80* 63* 54* 49* 58*  ALKPHOS 117 108 107 91 102  BILITOT 1.4* 1.1 1.1 0.6 0.8  PROT 6.3* 6.0* 6.3* 5.6* 6.3*  ALBUMIN 3.3* 3.0* 3.0* 2.7* 3.0*   CBC: Recent Labs  Lab 12/29/19 1035 12/30/19 0835 12/31/19 0927 01/01/20 0028 01/02/20 1005  WBC 15.4* 10.4 10.7* 10.1 12.2*  HGB 15.0 12.0* 14.2 12.9* 14.5  HCT 44.8 36.5* 43.2 38.9* 43.1  MCV  98.0 100.3* 99.1 99.2 99.8  PLT 360 229 303 266 288   CBG: No results for input(s): GLUCAP in the last 168 hours. Hgb A1c No results for input(s): HGBA1C in the last 72 hours. Lipid Profile No results for input(s): CHOL, HDL, LDLCALC, TRIG, CHOLHDL, LDLDIRECT in the last 72 hours. Thyroid function studies No results for input(s): TSH, T4TOTAL, T3FREE, THYROIDAB in the last 72 hours.  Invalid input(s): FREET3 Urinalysis    Component Value Date/Time   COLORURINE YELLOW (A) 12/20/2019 1300   APPEARANCEUR CLEAR (A) 12/20/2019 1300   LABSPEC 1.006 12/20/2019 1300   PHURINE 6.0 12/20/2019 1300   GLUCOSEU NEGATIVE 12/20/2019 1300   HGBUR NEGATIVE 12/20/2019 1300   BILIRUBINUR NEGATIVE 12/20/2019 1300   KETONESUR NEGATIVE 12/20/2019 1300   PROTEINUR NEGATIVE 12/20/2019 1300   NITRITE NEGATIVE 12/20/2019 1300   LEUKOCYTESUR NEGATIVE 12/20/2019 1300    FURTHER DISCHARGE INSTRUCTIONS:   Get Medicines reviewed and adjusted: Please take all your medications with you for your next visit with your Primary MD   Laboratory/radiological data: Please request your Primary MD to go over all hospital tests and procedure/radiological results at the follow up, please ask your Primary MD to get all Hospital records sent to his/her office.   In some cases, they will be blood work, cultures and biopsy results pending at the time of your discharge. Please  request that your primary care M.D. goes through all the records of your hospital data and follows up on these results.   Also Note the following: If you experience worsening of your admission symptoms, develop shortness of breath, life threatening emergency, suicidal or homicidal thoughts you must seek medical attention immediately by calling 911 or calling your MD immediately  if symptoms less severe.   You must read complete instructions/literature along with all the possible adverse reactions/side effects for all the Medicines you take and that have been prescribed to you. Take any new Medicines after you have completely understood and accpet all the possible adverse reactions/side effects.    Do not drive when taking Pain medications or sleeping medications (Benzodaizepines)   Do not take more than prescribed Pain, Sleep and Anxiety Medications. It is not advisable to combine anxiety,sleep and pain medications without talking with your primary care practitioner   Special Instructions: If you have smoked or chewed Tobacco  in the last 2 yrs please stop smoking, stop any regular Alcohol  and or any Recreational drug use.   Wear Seat belts while driving.   Please note: You were cared for by a hospitalist during your hospital stay. Once you are discharged, your primary care physician will handle any further medical issues. Please note that NO REFILLS for any discharge medications will be authorized once you are discharged, as it is imperative that you return to your primary care physician (or establish a relationship with a primary care physician if you do not have one) for your post hospital discharge needs so that they can reassess your need for medications and monitor your lab values.  Time coordinating discharge: 40 minutes  SIGNED:  Marzetta Board, MD, PhD 01/03/2020, 11:36 AM

## 2020-01-05 ENCOUNTER — Encounter: Payer: Self-pay | Admitting: Hematology and Oncology
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
## 2020-01-05 NOTE — Progress Notes (Signed)
The patient was not happy

## 2020-01-06 ENCOUNTER — Encounter: Payer: Self-pay | Admitting: Hematology and Oncology

## 2020-01-06 ENCOUNTER — Telehealth: Payer: Self-pay

## 2020-01-06 ENCOUNTER — Inpatient Hospital Stay: Payer: Medicare HMO | Attending: Hematology and Oncology | Admitting: Hematology and Oncology

## 2020-01-06 DIAGNOSIS — Z7952 Long term (current) use of systemic steroids: Secondary | ICD-10-CM

## 2020-01-06 DIAGNOSIS — Z66 Do not resuscitate: Secondary | ICD-10-CM

## 2020-01-06 DIAGNOSIS — R911 Solitary pulmonary nodule: Secondary | ICD-10-CM | POA: Diagnosis not present

## 2020-01-06 DIAGNOSIS — R05 Cough: Secondary | ICD-10-CM

## 2020-01-06 DIAGNOSIS — R634 Abnormal weight loss: Secondary | ICD-10-CM

## 2020-01-06 DIAGNOSIS — Z7901 Long term (current) use of anticoagulants: Secondary | ICD-10-CM

## 2020-01-06 DIAGNOSIS — Z79899 Other long term (current) drug therapy: Secondary | ICD-10-CM

## 2020-01-06 DIAGNOSIS — R0602 Shortness of breath: Secondary | ICD-10-CM

## 2020-01-06 DIAGNOSIS — I2699 Other pulmonary embolism without acute cor pulmonale: Secondary | ICD-10-CM

## 2020-01-06 DIAGNOSIS — G7000 Myasthenia gravis without (acute) exacerbation: Secondary | ICD-10-CM | POA: Diagnosis not present

## 2020-01-06 DIAGNOSIS — R5383 Other fatigue: Secondary | ICD-10-CM

## 2020-01-06 NOTE — Telephone Encounter (Signed)
Spoke with Ms Duane Santos to see if we can try Doximity or Mychart visit with her husband. i tryed several times to get connected but was unable. I was able to have Ms Duane Santos try the Webex visit. I was able to connect with Ms Duane Santos at 2:15, she wanted to do another trail run to see if she is able to connect with dr Duane Santos tomorrow. I was able to have Ms Duane Santos to do a trail run at 4:15 and the patient was able to connect. I also explained to Ms Duane Santos who I was and explained how our new patient visit work, Who Dr Duane Santos was and the other staff members. I also provided the family with the address and our location. Ms Duane Santos was already aware of the patient dx which he received while he was in the hospital.  The patient and he family was pleased that I was able to get them connected to have a safe and happy visit with Dr. [name].

## 2020-01-10 ENCOUNTER — Other Ambulatory Visit: Payer: Self-pay | Admitting: Hematology and Oncology

## 2020-01-10 DIAGNOSIS — R918 Other nonspecific abnormal finding of lung field: Secondary | ICD-10-CM | POA: Insufficient documentation

## 2020-01-10 DIAGNOSIS — R911 Solitary pulmonary nodule: Secondary | ICD-10-CM

## 2020-01-10 DIAGNOSIS — I2699 Other pulmonary embolism without acute cor pulmonale: Secondary | ICD-10-CM | POA: Insufficient documentation

## 2020-01-17 ENCOUNTER — Other Ambulatory Visit: Payer: Medicare HMO
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
## 2020-01-24 ENCOUNTER — Ambulatory Visit
Admission: RE | Admit: 2020-01-24 | Discharge: 2020-01-24 | Disposition: A | Discharge status: Home / Self Care | Payer: Medicare HMO | Source: Ambulatory Visit | Attending: Hematology and Oncology | Admitting: Hematology and Oncology

## 2020-01-24 ENCOUNTER — Other Ambulatory Visit: Payer: Self-pay | Admitting: Hematology and Oncology

## 2020-01-24 ENCOUNTER — Other Ambulatory Visit: Payer: Self-pay

## 2020-01-24 DIAGNOSIS — I2510 Atherosclerotic heart disease of native coronary artery without angina pectoris: Secondary | ICD-10-CM | POA: Insufficient documentation

## 2020-01-24 DIAGNOSIS — J439 Emphysema, unspecified: Secondary | ICD-10-CM | POA: Diagnosis not present

## 2020-01-24 DIAGNOSIS — I700 Atherosclerosis of aorta: Secondary | ICD-10-CM | POA: Diagnosis not present

## 2020-01-24 DIAGNOSIS — R911 Solitary pulmonary nodule: Secondary | ICD-10-CM

## 2020-01-24 LAB — GLUCOSE, CAPILLARY: Glucose-Capillary: 79 mg/dL (ref 70–99)

## 2020-01-24 MED ORDER — FLUDEOXYGLUCOSE F - 18 (FDG) INJECTION
6.4700 | Freq: Once | INTRAVENOUS | Status: AC | PRN
  Administered 2020-01-24: 13:00:00 6.47 via INTRAVENOUS

## 2020-01-27 ENCOUNTER — Encounter: Payer: Self-pay | Admitting: Hematology and Oncology

## 2020-01-27 ENCOUNTER — Other Ambulatory Visit: Payer: Self-pay

## 2020-01-27 ENCOUNTER — Other Ambulatory Visit: Payer: Medicare HMO

## 2020-01-27 NOTE — Progress Notes (Signed)
Tumor Board Documentation  KAILO KOSIK was presented by Dr Mike Gip at our Tumor Board on 01/27/2020, which included representatives from medical oncology, radiation oncology, navigation, pathology, radiology, surgical, internal medicine, genetics, pulmonology, palliative care, research.  Fernand currently presents as a new patient, for discussion with history of the following treatments: active survellience.  Additionally, we reviewed previous medical and familial history, history of present illness, and recent lab results along with all available histopathologic and imaging studies. The tumor board considered available treatment options and made the following recommendations: Biopsy Refer to Pulmonology, possible bx after his health improves (will needs to stop Eliquis for biopsy)  The following procedures/referrals were also placed: No orders of the defined types were placed in this encounter.   Clinical Trial Status: not discussed   Staging used: Not Applicable  National site-specific guidelines   were discussed with respect to the case.  Tumor board is a meeting of clinicians from various specialty areas who evaluate and discuss patients for whom a multidisciplinary approach is being considered. Final determinations in the plan of care are those of the provider(s). The responsibility for follow up of recommendations given during tumor board is that of the provider.   Today's extended care, comprehensive team conference, Ethelbert was not present for the discussion and was not examined.   Multidisciplinary Tumor Board is a multidisciplinary case peer review process.  Decisions discussed in the Multidisciplinary Tumor Board reflect the opinions of the specialists present at the conference without having examined the patient.  Ultimately, treatment and diagnostic decisions rest with the primary provider(s) and the patient.

## 2020-01-27 NOTE — Progress Notes (Signed)
Wife reports the patient is still recovering from [place] [address] . He is still having SOB and palpitation with any movement. The patient Name, DOB and medication has been verified by wife.

## 2020-01-28 ENCOUNTER — Inpatient Hospital Stay: Payer: Medicare HMO | Attending: Hematology and Oncology | Admitting: Hematology and Oncology

## 2020-01-28 VITALS — BP 143/71 | HR 101 | Temp 98.7°F | Resp 20 | Wt 120.7 lb

## 2020-01-28 DIAGNOSIS — Z7901 Long term (current) use of anticoagulants: Secondary | ICD-10-CM | POA: Diagnosis not present

## 2020-01-28 DIAGNOSIS — G7000 Myasthenia gravis without (acute) exacerbation: Secondary | ICD-10-CM | POA: Diagnosis not present

## 2020-01-28 DIAGNOSIS — I2699 Other pulmonary embolism without acute cor pulmonale: Secondary | ICD-10-CM

## 2020-01-28 DIAGNOSIS — Z7952 Long term (current) use of systemic steroids: Secondary | ICD-10-CM | POA: Insufficient documentation

## 2020-01-28 DIAGNOSIS — R911 Solitary pulmonary nodule: Secondary | ICD-10-CM | POA: Diagnosis not present

## 2020-01-28 DIAGNOSIS — Z86718 Personal history of other venous thrombosis and embolism: Secondary | ICD-10-CM | POA: Insufficient documentation

## 2020-01-28 DIAGNOSIS — Z8616 Personal history of COVID-19: Secondary | ICD-10-CM | POA: Insufficient documentation

## 2020-01-28 DIAGNOSIS — R590 Localized enlarged lymph nodes: Secondary | ICD-10-CM | POA: Insufficient documentation

## 2020-01-28 DIAGNOSIS — Z86711 Personal history of pulmonary embolism: Secondary | ICD-10-CM | POA: Diagnosis not present

## 2020-01-28 DIAGNOSIS — Z85828 Personal history of other malignant neoplasm of skin: Secondary | ICD-10-CM | POA: Insufficient documentation

## 2020-01-28 DIAGNOSIS — R918 Other nonspecific abnormal finding of lung field: Secondary | ICD-10-CM | POA: Diagnosis present

## 2020-01-28 DIAGNOSIS — Z79899 Other long term (current) drug therapy: Secondary | ICD-10-CM | POA: Diagnosis not present

## 2020-02-02 ENCOUNTER — Ambulatory Visit (INDEPENDENT_AMBULATORY_CARE_PROVIDER_SITE_OTHER): Payer: Medicare HMO

## 2020-02-02 ENCOUNTER — Other Ambulatory Visit: Payer: Self-pay

## 2020-02-02 ENCOUNTER — Encounter: Payer: Self-pay | Admitting: Emergency Medicine

## 2020-02-02 ENCOUNTER — Ambulatory Visit
Admission: EM | Admit: 2020-02-02 | Discharge: 2020-02-02 | Disposition: A | Discharge status: Home / Self Care | Payer: Medicare HMO | Attending: Family Medicine | Admitting: Family Medicine

## 2020-02-02 DIAGNOSIS — R509 Fever, unspecified: Secondary | ICD-10-CM | POA: Diagnosis present

## 2020-02-02 DIAGNOSIS — R0602 Shortness of breath: Secondary | ICD-10-CM | POA: Diagnosis present

## 2020-02-02 DIAGNOSIS — E871 Hypo-osmolality and hyponatremia: Secondary | ICD-10-CM

## 2020-02-02 DIAGNOSIS — R918 Other nonspecific abnormal finding of lung field: Secondary | ICD-10-CM | POA: Diagnosis present

## 2020-02-02 DIAGNOSIS — R531 Weakness: Secondary | ICD-10-CM | POA: Insufficient documentation

## 2020-02-02 LAB — COMPREHENSIVE METABOLIC PANEL
ALT: 30 U/L (ref 0–44)
AST: 28 U/L (ref 15–41)
Albumin: 3.5 g/dL (ref 3.5–5.0)
Alkaline Phosphatase: 68 U/L (ref 38–126)
Anion gap: 12 (ref 5–15)
BUN: 18 mg/dL (ref 8–23)
CO2: 23 mmol/L (ref 22–32)
Calcium: 9.4 mg/dL (ref 8.9–10.3)
Chloride: 89 mmol/L — ABNORMAL LOW (ref 98–111)
Creatinine, Ser: 0.78 mg/dL (ref 0.61–1.24)
GFR calc Af Amer: >60 mL/min (ref >60)
GFR calc non Af Amer: >60 mL/min (ref >60)
Glucose, Bld: 125 mg/dL — ABNORMAL HIGH (ref 70–99)
Potassium: 3.9 mmol/L (ref 3.5–5.1)
Sodium: 124 mmol/L — ABNORMAL LOW (ref 135–145)
Total Bilirubin: 0.7 mg/dL (ref 0.3–1.2)
Total Protein: 7.3 g/dL (ref 6.5–8.1)

## 2020-02-02 LAB — URINALYSIS, COMPLETE (UACMP) WITH MICROSCOPIC
Bilirubin Urine: NEGATIVE
Glucose, UA: NEGATIVE mg/dL
Ketones, ur: NEGATIVE mg/dL
Leukocytes,Ua: NEGATIVE
Nitrite: NEGATIVE
Protein, ur: NEGATIVE mg/dL
Specific Gravity, Urine: 1.015 (ref 1.005–1.030)
pH: 7 (ref 5.0–8.0)

## 2020-02-02 LAB — CBC WITH DIFFERENTIAL/PLATELET
Abs Immature Granulocytes: 0.14 10*3/uL — ABNORMAL HIGH (ref 0.00–0.07)
Basophils Absolute: 0 10*3/uL (ref 0.0–0.1)
Basophils Relative: 0 %
Eosinophils Absolute: 0 10*3/uL (ref 0.0–0.5)
Eosinophils Relative: 0 %
HCT: 35.9 % — ABNORMAL LOW (ref 39.0–52.0)
Hemoglobin: 12.2 g/dL — ABNORMAL LOW (ref 13.0–17.0)
Immature Granulocytes: 2 %
Lymphocytes Relative: 3 %
Lymphs Abs: 0.3 10*3/uL — ABNORMAL LOW (ref 0.7–4.0)
MCH: 32.5 pg (ref 26.0–34.0)
MCHC: 34 g/dL (ref 30.0–36.0)
MCV: 95.7 fL (ref 80.0–100.0)
Monocytes Absolute: 0.7 10*3/uL (ref 0.1–1.0)
Monocytes Relative: 7 %
Neutro Abs: 7.9 10*3/uL — ABNORMAL HIGH (ref 1.7–7.7)
Neutrophils Relative %: 88 %
Platelets: 327 10*3/uL (ref 150–400)
RBC: 3.75 MIL/uL — ABNORMAL LOW (ref 4.22–5.81)
RDW: 15.1 % (ref 11.5–15.5)
WBC: 9 10*3/uL (ref 4.0–10.5)
nRBC: 0 % (ref 0.0–0.2)

## 2020-02-02 MED ORDER — LEVOFLOXACIN 750 MG PO TABS: 750.0000 mg | ORAL_TABLET | Freq: Every day | ORAL | 0 refills | Status: DC

## 2020-02-02 NOTE — Discharge Instructions (Signed)
Medication as directed.  Be sure to see Pulmonology.  If you worsen, go to the hospital.  Take care  Dr. Lacinda Axon
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
## 2020-02-02 NOTE — ED Triage Notes (Signed)
Patient was in the hospital in January for Duane Santos. He came home on oxygen and still remains on the oxygen. Wife reports that over the last few days he has become weak and he had a fever yesterday of 100.3.

## 2020-02-03 ENCOUNTER — Other Ambulatory Visit: Payer: Self-pay | Admitting: Pulmonary Disease

## 2020-02-03 DIAGNOSIS — R918 Other nonspecific abnormal finding of lung field: Secondary | ICD-10-CM

## 2020-02-07 ENCOUNTER — Other Ambulatory Visit: Payer: Self-pay

## 2020-02-09 ENCOUNTER — Other Ambulatory Visit: Payer: Self-pay

## 2020-02-09 ENCOUNTER — Emergency Department: Payer: Medicare HMO

## 2020-02-09 ENCOUNTER — Inpatient Hospital Stay
Admission: EM | Admit: 2020-02-09 | Discharge: 2020-02-24 | DRG: 040 | Disposition: A | Discharge status: Rehabilitation Facility | Payer: Medicare HMO | Attending: Internal Medicine | Admitting: Internal Medicine

## 2020-02-09 DIAGNOSIS — G7000 Myasthenia gravis without (acute) exacerbation: Secondary | ICD-10-CM | POA: Diagnosis present

## 2020-02-09 DIAGNOSIS — Z7901 Long term (current) use of anticoagulants: Secondary | ICD-10-CM

## 2020-02-09 DIAGNOSIS — I6340 Cerebral infarction due to embolism of unspecified cerebral artery: Secondary | ICD-10-CM | POA: Diagnosis present

## 2020-02-09 DIAGNOSIS — C3490 Malignant neoplasm of unspecified part of unspecified bronchus or lung: Secondary | ICD-10-CM | POA: Diagnosis present

## 2020-02-09 DIAGNOSIS — Z86711 Personal history of pulmonary embolism: Secondary | ICD-10-CM

## 2020-02-09 DIAGNOSIS — Z515 Encounter for palliative care: Secondary | ICD-10-CM

## 2020-02-09 DIAGNOSIS — J984 Other disorders of lung: Secondary | ICD-10-CM

## 2020-02-09 DIAGNOSIS — Z681 Body mass index (BMI) 19 or less, adult: Secondary | ICD-10-CM

## 2020-02-09 DIAGNOSIS — Z66 Do not resuscitate: Secondary | ICD-10-CM | POA: Diagnosis present

## 2020-02-09 DIAGNOSIS — J9611 Chronic respiratory failure with hypoxia: Secondary | ICD-10-CM

## 2020-02-09 DIAGNOSIS — B457 Disseminated cryptococcosis: Secondary | ICD-10-CM | POA: Diagnosis present

## 2020-02-09 DIAGNOSIS — J189 Pneumonia, unspecified organism: Secondary | ICD-10-CM | POA: Diagnosis present

## 2020-02-09 DIAGNOSIS — R410 Disorientation, unspecified: Secondary | ICD-10-CM

## 2020-02-09 DIAGNOSIS — R64 Cachexia: Secondary | ICD-10-CM | POA: Diagnosis present

## 2020-02-09 DIAGNOSIS — E871 Hypo-osmolality and hyponatremia: Secondary | ICD-10-CM

## 2020-02-09 DIAGNOSIS — F05 Delirium due to known physiological condition: Secondary | ICD-10-CM | POA: Diagnosis present

## 2020-02-09 DIAGNOSIS — Z87891 Personal history of nicotine dependence: Secondary | ICD-10-CM

## 2020-02-09 DIAGNOSIS — R4182 Altered mental status, unspecified: Secondary | ICD-10-CM | POA: Diagnosis not present

## 2020-02-09 DIAGNOSIS — G9341 Metabolic encephalopathy: Secondary | ICD-10-CM

## 2020-02-09 DIAGNOSIS — I639 Cerebral infarction, unspecified: Secondary | ICD-10-CM

## 2020-02-09 DIAGNOSIS — R918 Other nonspecific abnormal finding of lung field: Secondary | ICD-10-CM

## 2020-02-09 DIAGNOSIS — Z86718 Personal history of other venous thrombosis and embolism: Secondary | ICD-10-CM

## 2020-02-09 DIAGNOSIS — D849 Immunodeficiency, unspecified: Secondary | ICD-10-CM | POA: Diagnosis present

## 2020-02-09 DIAGNOSIS — Z8701 Personal history of pneumonia (recurrent): Secondary | ICD-10-CM

## 2020-02-09 DIAGNOSIS — I77810 Thoracic aortic ectasia: Secondary | ICD-10-CM | POA: Diagnosis present

## 2020-02-09 DIAGNOSIS — R296 Repeated falls: Secondary | ICD-10-CM | POA: Diagnosis present

## 2020-02-09 DIAGNOSIS — R5381 Other malaise: Secondary | ICD-10-CM | POA: Diagnosis present

## 2020-02-09 DIAGNOSIS — F329 Major depressive disorder, single episode, unspecified: Secondary | ICD-10-CM | POA: Diagnosis present

## 2020-02-09 DIAGNOSIS — J188 Other pneumonia, unspecified organism: Secondary | ICD-10-CM | POA: Clinically undetermined

## 2020-02-09 DIAGNOSIS — R9401 Abnormal electroencephalogram [EEG]: Secondary | ICD-10-CM | POA: Diagnosis present

## 2020-02-09 DIAGNOSIS — M810 Age-related osteoporosis without current pathological fracture: Secondary | ICD-10-CM | POA: Diagnosis present

## 2020-02-09 DIAGNOSIS — R509 Fever, unspecified: Secondary | ICD-10-CM

## 2020-02-09 DIAGNOSIS — Z85828 Personal history of other malignant neoplasm of skin: Secondary | ICD-10-CM

## 2020-02-09 DIAGNOSIS — R531 Weakness: Secondary | ICD-10-CM

## 2020-02-09 DIAGNOSIS — C7931 Secondary malignant neoplasm of brain: Secondary | ICD-10-CM | POA: Diagnosis present

## 2020-02-09 DIAGNOSIS — Z8616 Personal history of COVID-19: Secondary | ICD-10-CM

## 2020-02-09 DIAGNOSIS — Z7952 Long term (current) use of systemic steroids: Secondary | ICD-10-CM

## 2020-02-09 DIAGNOSIS — Z79899 Other long term (current) drug therapy: Secondary | ICD-10-CM

## 2020-02-09 DIAGNOSIS — B451 Cerebral cryptococcosis: Principal | ICD-10-CM | POA: Diagnosis present

## 2020-02-09 DIAGNOSIS — E222 Syndrome of inappropriate secretion of antidiuretic hormone: Secondary | ICD-10-CM | POA: Diagnosis present

## 2020-02-09 LAB — COMPREHENSIVE METABOLIC PANEL
ALT: 31 U/L (ref 0–44)
AST: 27 U/L (ref 15–41)
Albumin: 3.6 g/dL (ref 3.5–5.0)
Alkaline Phosphatase: 63 U/L (ref 38–126)
Anion gap: 12 (ref 5–15)
BUN: 17 mg/dL (ref 8–23)
CO2: 26 mmol/L (ref 22–32)
Calcium: 10.1 mg/dL (ref 8.9–10.3)
Chloride: 90 mmol/L — ABNORMAL LOW (ref 98–111)
Creatinine, Ser: 0.9 mg/dL (ref 0.61–1.24)
GFR calc Af Amer: >60 mL/min (ref >60)
GFR calc non Af Amer: >60 mL/min (ref >60)
Glucose, Bld: 137 mg/dL — ABNORMAL HIGH (ref 70–99)
Potassium: 4.7 mmol/L (ref 3.5–5.1)
Sodium: 128 mmol/L — ABNORMAL LOW (ref 135–145)
Total Bilirubin: 0.6 mg/dL (ref 0.3–1.2)
Total Protein: 7.1 g/dL (ref 6.5–8.1)

## 2020-02-09 LAB — URINALYSIS, COMPLETE (UACMP) WITH MICROSCOPIC
Bacteria, UA: NONE SEEN
Bilirubin Urine: NEGATIVE
Glucose, UA: NEGATIVE mg/dL
Ketones, ur: NEGATIVE mg/dL
Leukocytes,Ua: NEGATIVE
Nitrite: NEGATIVE
Protein, ur: NEGATIVE mg/dL
Specific Gravity, Urine: 1.01 (ref 1.005–1.030)
Squamous Epithelial / HPF: NONE SEEN (ref 0–5)
pH: 8 (ref 5.0–8.0)

## 2020-02-09 LAB — CBC
HCT: 37.6 % — ABNORMAL LOW (ref 39.0–52.0)
Hemoglobin: 12.6 g/dL — ABNORMAL LOW (ref 13.0–17.0)
MCH: 33 pg (ref 26.0–34.0)
MCHC: 33.5 g/dL (ref 30.0–36.0)
MCV: 98.4 fL (ref 80.0–100.0)
Platelets: 322 10*3/uL (ref 150–400)
RBC: 3.82 MIL/uL — ABNORMAL LOW (ref 4.22–5.81)
RDW: 15.1 % (ref 11.5–15.5)
WBC: 10.6 10*3/uL — ABNORMAL HIGH (ref 4.0–10.5)
nRBC: 0 % (ref 0.0–0.2)

## 2020-02-09 LAB — PROTIME-INR
INR: 1.1 (ref 0.8–1.2)
Prothrombin Time: 14.2 seconds (ref 11.4–15.2)

## 2020-02-09 LAB — OSMOLALITY: Osmolality: 280 mOsm/kg (ref 275–295)

## 2020-02-09 LAB — OSMOLALITY, URINE: Osmolality, Ur: 394 mOsm/kg (ref 300–900)

## 2020-02-09 LAB — SODIUM, URINE, RANDOM: Sodium, Ur: 56 mmol/L

## 2020-02-09 MED ORDER — ONDANSETRON HCL 4 MG/2ML IJ SOLN: 4.0000 mg | Freq: Four times a day (QID) | INTRAMUSCULAR | Status: DC | PRN

## 2020-02-09 MED ORDER — SODIUM CHLORIDE 0.9 % IV SOLN: INTRAVENOUS | Status: DC

## 2020-02-09 MED ORDER — ACETAMINOPHEN 650 MG RE SUPP: 650.0000 mg | Freq: Four times a day (QID) | RECTAL | Status: DC | PRN

## 2020-02-09 MED ORDER — APIXABAN 5 MG PO TABS
5.0000 mg | ORAL_TABLET | Freq: Two times a day (BID) | ORAL | Status: DC
  Administered 2020-02-09 – 2020-02-10 (×2): 5 mg via ORAL
  Filled 2020-02-09 (×2): qty 1

## 2020-02-09 MED ORDER — ACETAMINOPHEN 325 MG PO TABS
650.0000 mg | ORAL_TABLET | Freq: Four times a day (QID) | ORAL | Status: DC | PRN
  Administered 2020-02-12 – 2020-02-24 (×9): 650 mg via ORAL
  Filled 2020-02-09 (×9): qty 2

## 2020-02-09 MED ORDER — ONDANSETRON HCL 4 MG PO TABS: 4.0000 mg | ORAL_TABLET | Freq: Four times a day (QID) | ORAL | Status: DC | PRN

## 2020-02-09 MED ORDER — SODIUM CHLORIDE 0.9 % IV BOLUS
1000.0000 mL | Freq: Once | INTRAVENOUS | Status: AC
  Administered 2020-02-09: 1000 mL via INTRAVENOUS

## 2020-02-09 MED ORDER — SENNOSIDES-DOCUSATE SODIUM 8.6-50 MG PO TABS: 1.0000 | ORAL_TABLET | Freq: Every evening | ORAL | Status: DC | PRN

## 2020-02-09 NOTE — ED Triage Notes (Signed)
Pt comes POV with AMS. Pt was seen at [facility] today with sodium of 124. Pt also has a new lung mass and possible rib mets. Pt losing weight, having fevers off and on, and going in and out of confusion. Pt has refused to be hospitalized recently and wants to be placed in hospice per [place] paperwork. Pt disoriented to place and situation. Pt states that he is in that basement of the [facility].
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
## 2020-02-09 NOTE — ED Notes (Signed)
Pt presents to the ED w/ wife. Per wife pt was dx w/ COVID in January and was recovering well but since February 22nd has been steadily declining. Pt has been having mental confusion, lethargy, weakness and falls at home.
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
## 2020-02-10 ENCOUNTER — Observation Stay: Payer: Medicare HMO

## 2020-02-10 ENCOUNTER — Encounter: Payer: Self-pay | Admitting: Internal Medicine

## 2020-02-10 DIAGNOSIS — J9611 Chronic respiratory failure with hypoxia: Secondary | ICD-10-CM | POA: Diagnosis not present

## 2020-02-10 DIAGNOSIS — G9341 Metabolic encephalopathy: Secondary | ICD-10-CM | POA: Diagnosis not present

## 2020-02-10 DIAGNOSIS — E871 Hypo-osmolality and hyponatremia: Secondary | ICD-10-CM | POA: Diagnosis not present

## 2020-02-10 DIAGNOSIS — Z8616 Personal history of COVID-19: Secondary | ICD-10-CM | POA: Diagnosis not present

## 2020-02-10 DIAGNOSIS — R531 Weakness: Secondary | ICD-10-CM | POA: Diagnosis not present

## 2020-02-10 DIAGNOSIS — R918 Other nonspecific abnormal finding of lung field: Secondary | ICD-10-CM

## 2020-02-10 LAB — CBC
HCT: 37.7 % — ABNORMAL LOW (ref 39.0–52.0)
Hemoglobin: 12.8 g/dL — ABNORMAL LOW (ref 13.0–17.0)
MCH: 33.3 pg (ref 26.0–34.0)
MCHC: 34 g/dL (ref 30.0–36.0)
MCV: 98.2 fL (ref 80.0–100.0)
Platelets: 279 10*3/uL (ref 150–400)
RBC: 3.84 MIL/uL — ABNORMAL LOW (ref 4.22–5.81)
RDW: 14.8 % (ref 11.5–15.5)
WBC: 10 10*3/uL (ref 4.0–10.5)
nRBC: 0 % (ref 0.0–0.2)

## 2020-02-10 LAB — BASIC METABOLIC PANEL
Anion gap: 10 (ref 5–15)
BUN: 11 mg/dL (ref 8–23)
CO2: 29 mmol/L (ref 22–32)
Calcium: 9.2 mg/dL (ref 8.9–10.3)
Chloride: 90 mmol/L — ABNORMAL LOW (ref 98–111)
Creatinine, Ser: 0.71 mg/dL (ref 0.61–1.24)
GFR calc Af Amer: >60 mL/min (ref >60)
GFR calc non Af Amer: >60 mL/min (ref >60)
Glucose, Bld: 97 mg/dL (ref 70–99)
Potassium: 3.6 mmol/L (ref 3.5–5.1)
Sodium: 129 mmol/L — ABNORMAL LOW (ref 135–145)

## 2020-02-10 MED ORDER — SODIUM CHLORIDE 1 G PO TABS
1.0000 g | ORAL_TABLET | Freq: Two times a day (BID) | ORAL | Status: DC
  Administered 2020-02-10 – 2020-02-11 (×2): 1 g via ORAL
  Filled 2020-02-10 (×3): qty 1

## 2020-02-10 MED ORDER — TESTOSTERONE 50 MG/5GM (1%) TD GEL: 5.0000 g | TRANSDERMAL | Status: DC

## 2020-02-10 MED ORDER — ENOXAPARIN SODIUM 30 MG/0.3ML ~~LOC~~ SOLN: 30.0000 mg | SUBCUTANEOUS | Status: DC

## 2020-02-10 MED ORDER — GADOBUTROL 1 MMOL/ML IV SOLN
5.0000 mL | Freq: Once | INTRAVENOUS | Status: AC | PRN
  Administered 2020-02-10: 5 mL via INTRAVENOUS

## 2020-02-10 MED ORDER — PREDNISONE 20 MG PO TABS
20.0000 mg | ORAL_TABLET | Freq: Every day | ORAL | Status: DC
  Administered 2020-02-10 – 2020-02-17 (×8): 20 mg via ORAL
  Filled 2020-02-10 (×8): qty 1

## 2020-02-10 MED ORDER — THIAMINE HCL 100 MG PO TABS
100.0000 mg | ORAL_TABLET | Freq: Every day | ORAL | Status: DC
  Administered 2020-02-10 – 2020-02-24 (×14): 100 mg via ORAL
  Filled 2020-02-10 (×14): qty 1

## 2020-02-10 MED ORDER — ENOXAPARIN SODIUM 60 MG/0.6ML ~~LOC~~ SOLN
1.0000 mg/kg | Freq: Two times a day (BID) | SUBCUTANEOUS | Status: DC
  Administered 2020-02-10 – 2020-02-15 (×11): 55 mg via SUBCUTANEOUS
  Filled 2020-02-10 (×13): qty 0.6

## 2020-02-10 NOTE — Progress Notes (Addendum)
Progress Note    Duane Santos  JKD:[id number] DOB: August 25, 1946  DOA: 02/09/2020 PCP: [place]      Brief Narrative:    Medical records reviewed and are as summarized below:  Duane Santos is an 74 y.o. male with medical history significant for myasthenia gravis, history of COVID-19 pneumonia with hypoxia diagnosed 12/13/2019, hospitalized from 12/21/2019 to 01/03/2020 discharged on oxygen, currently on O2 at 3 L, who also has a history of right lower lobe mass diagnosed during his last hospitalization, last seen by oncology on 01/28/2020, on Eliquis for PE and DVT diagnosed during his last hospitalization, who presented to the emergency room with two weeks off onset confusion around 01/31/20, associated with persistent protracted weakness and falls since his hospitalization.  History is taken from his wife and caregiver due to patient's confusion.    He has a history of myasthenia gravis on prednisone and he was advised by his neurologist not to take any prednisone more than 20 mg.  Reportedly, prior to his COVID-19 infection, he was very independent but since then he has been slowly declining and has required a walker formulation.      Assessment/Plan:   Principal Problem:   Acute metabolic encephalopathy Active Problems:   Myasthenia gravis ([place])   Right lower lobe lung mass   Generalized weakness   Hyponatremia   History of COVID-19 pneumonia with hypoxia   Chronic respiratory failure with hypoxia (HCC)   Acute confusional state: Differential diagnosis include acute toxic metabolic encephalopathy, subacute stroke, metastatic disease to the brain.  MRI brain showed several areas of restricted diffusion in the cerebellum bilaterally possibly subacute infarct versus metastatic disease.  Follow-up with neurologist for further recommendations.  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  Treat with salt tablets.  Right lung mass suspicious for malignancy, sclerotic  changes of the right third fourth and fifth ribs suspicious for metastatic disease: Consulted pulmonologist for further evaluation.  Plan for biopsy noted.  He recommended that Eliquis be held for 7 days prior to biopsy.  Recent pulmonary embolism and DVT diagnosed on December 23, 2019: Eliquis has been held because of plan for biopsy.  Full dose Lovenox will be started as a bridge for treatment of pulmonary embolism.  Myasthenia gravis: Continue prednisone  Recent COVID-19 pneumonia/chronic hypoxemic respiratory failure: Continue oxygen via nasal cannula.  Generalized weakness/debility: PT and OT evaluation.  Body mass index is 19.21 kg/m.   Family Communication/Anticipated D/C date and plan/Code Status   DVT prophylaxis: Lovenox Code Status: DNR Family Communication: Plan discussed with his wife Disposition Plan: Patient is from home.  Plan to discharge home when hyponatremia and confusion improve.      Subjective:   Patient has no complaints.  He feels a little better.  Objective:    Vitals:   02/09/20 1841 02/09/20 1900 02/09/20 1905 02/09/20 2059  BP: (!) 142/84 136/85  138/81  Pulse: 99 92  89  Resp: 18 17  14   Temp:    98.8 F (37.1 C)  TempSrc:    Oral  SpO2: 99% 100% 96% 100%  Weight:      Height:        Intake/Output Summary (Last 24 hours) at 02/10/2020 1355 Last data filed at 02/10/2020 1028 Gross per 24 hour  Intake 1360 ml  Output 700 ml  Net 660 ml   Filed Weights   02/09/20 1656  Weight: 54 kg    Exam:  GEN: NAD SKIN:  No rash EYES: EOMI ENT: MMM CV: RRR PULM: CTA B ABD: soft, ND, NT, +BS CNS: AAO x 3, non focal EXT: No edema or tenderness   Data Reviewed:   I have personally reviewed following labs and imaging studies:  Labs: Labs show the following:   Basic Metabolic Panel: Recent Labs  Lab 02/09/20 1701 02/10/20 0802  NA 128* 129*  K 4.7 3.6  CL 90* 90*  CO2 26 29  GLUCOSE 137* 97  BUN 17 11  CREATININE 0.90 0.71    CALCIUM 10.1 9.2   GFR Estimated Creatinine Clearance: 63.8 mL/min (by C-G formula based on SCr of 0.71 mg/dL). Liver Function Tests: Recent Labs  Lab 02/09/20 1701  AST 27  ALT 31  ALKPHOS 63  BILITOT 0.6  PROT 7.1  ALBUMIN 3.6   No results for input(s): LIPASE, AMYLASE in the last 168 hours. No results for input(s): AMMONIA in the last 168 hours. Coagulation profile Recent Labs  Lab 02/09/20 1701  INR 1.1    CBC: Recent Labs  Lab 02/09/20 1701 02/10/20 0802  WBC 10.6* 10.0  HGB 12.6* 12.8*  HCT 37.6* 37.7*  MCV 98.4 98.2  PLT 322 279   Cardiac Enzymes: No results for input(s): CKTOTAL, CKMB, CKMBINDEX, TROPONINI in the last 168 hours. BNP (last 3 results) No results for input(s): PROBNP in the last 8760 hours. CBG: No results for input(s): GLUCAP in the last 168 hours. D-Dimer: No results for input(s): DDIMER in the last 72 hours. Hgb A1c: No results for input(s): HGBA1C in the last 72 hours. Lipid Profile: No results for input(s): CHOL, HDL, LDLCALC, TRIG, CHOLHDL, LDLDIRECT in the last 72 hours. Thyroid function studies: No results for input(s): TSH, T4TOTAL, T3FREE, THYROIDAB in the last 72 hours.  Invalid input(s): FREET3 Anemia work up: No results for input(s): VITAMINB12, FOLATE, FERRITIN, TIBC, IRON, RETICCTPCT in the last 72 hours. Sepsis Labs: Recent Labs  Lab 02/09/20 1701 02/10/20 0802  WBC 10.6* 10.0    Microbiology No results found for this or any previous visit (from the past 240 hour(s)).  Procedures and diagnostic studies:  CT Head Wo Contrast  Result Date: 02/09/2020 CLINICAL DATA:  74 year old male with confusion. EXAM: CT HEAD WITHOUT CONTRAST TECHNIQUE: Contiguous axial images were obtained from the base of the skull through the vertex without intravenous contrast. COMPARISON:  None. FINDINGS: Brain: There is mild age-related atrophy and chronic microvascular ischemic changes. There is no acute intracranial hemorrhage. No mass  effect or midline shift. No extra-axial fluid collection. Vascular: No hyperdense vessel or unexpected calcification. Skull: Normal. Negative for fracture or focal lesion. Sinuses/Orbits: No acute finding. Other: None IMPRESSION: 1. No acute intracranial pathology. 2. Mild age-related atrophy and chronic microvascular ischemic changes. Electronically Signed   By: Anner Crete M.D.   On: 02/09/2020 18:57   MR BRAIN W WO CONTRAST  Result Date: 02/10/2020 CLINICAL DATA:  Small cell lung cancer EXAM: MRI HEAD WITHOUT AND WITH CONTRAST TECHNIQUE: Multiplanar, multiecho pulse sequences of the brain and surrounding structures were obtained without and with intravenous contrast. CONTRAST:  21mL GADAVIST GADOBUTROL 1 MMOL/ML IV SOLN COMPARISON:  CT head 02/09/2020.  No prior MRI for comparison. FINDINGS: Brain: Image quality degraded by motion. Postcontrast imaging degraded by significant motion. Small cerebellar lesions are present bilaterally on diffusion-weighted imaging. These cannot be confirmed on postcontrast imaging due to small size and motion. Two small 3 mm lesions left inferior cerebellum and small lesion left lateral cerebellum. Ill-defined area of restricted diffusion  right posterior cerebellum. These lesions are difficult to see on FLAIR and T2 do not show hemorrhage. Possible recent infarct versus metastatic disease. Follow-up imaging will be necessary to clarify. Mild atrophy. Mild chronic microvascular ischemic change in the white matter. No areas of hemorrhage Postcontrast imaging degraded by motion. No large enhancing lesion identified. Vascular: Normal arterial flow voids. Skull and upper cervical spine: No focal skull lesion. Sinuses/Orbits: Paranasal sinuses clear.  Bilateral cataract surgery Other: None IMPRESSION: Several small areas of restricted diffusion in the cerebellum bilaterally. Possible subacute infarct versus metastatic disease. Recommend follow-up MRI brain without with contrast  when the patient is able to hold still, possibly with sedation Electronically Signed   By: Franchot Gallo M.D.   On: 02/10/2020 13:14    Medications:   . predniSONE  20 mg Oral Q breakfast  . thiamine  100 mg Oral Daily   Continuous Infusions:   LOS: 0 days   Anselm Aumiller  [facility]     02/10/2020, 1:55 PM

## 2020-02-10 NOTE — Procedures (Signed)
ELECTROENCEPHALOGRAM REPORT   Patient: Duane Santos       Room #: 136A-AA EEG No. ID: [id number] Age: 74 y.o.        Sex: male Requesting Physician: Mal Misty Report Date:  02/10/2020        Interpreting Physician: Alexis Goodell  History: Duane Santos is an 74 y.o. male with altered mental status  Medications:  Prednisone, Thiamine  Conditions of Recording:  This is a 21 channel routine scalp EEG performed with bipolar and monopolar montages arranged in accordance to the international 10/20 system of electrode placement. One channel was dedicated to EKG recording.  The patient is in the awake and uncooperative state.  Description:  Artifact is prominent during the recording often obscuring the background rhythm. When able to be visualized the background is slow and poorly organized.   It consists of a low voltage, polymorphic delta rhythm that is diffusely distributed and continuous.  Some intermixed poorly organized theta activity is noted at times as well.    No epileptiform activity is noted.   Hyperventilation was not performed.  Intermittent photic stimulation was performed but failed to illicit any change in the tracing.    IMPRESSION: This is a technically difficult electroencephalogram secondary to muscle and movement artifact.  When able to be visualized though, the EEG is abnormal secondary to general background slowing.  This finding may be seen with a diffuse disturbance that is etiologically nonspecific, but may include a metabolic encephalopathy, among other possibilities.  No epileptiform activity was noted.     Alexis Goodell, MD Neurology [phone] 02/10/2020, 3:06 PM

## 2020-02-10 NOTE — Progress Notes (Signed)
The nurse messaged Dr. Mal Misty regarding pt's wife at bedside and would like to speak to.
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
## 2020-02-10 NOTE — TOC Initial Note (Signed)
Transition of Care [facility]) - Initial/Assessment Note    Patient Details  Name: Duane Santos MRN: [id number] Date of Birth: [birth year]  Transition of Care [facility]) CM/SW Contact:    Elease Hashimoto, LCSW Phone Number: 02/10/2020, 1:29 PM  Clinical Narrative:  Met with pt who is somewhat confused and wanting to know where he is going. Informed would call wife and he was in agreement. Wife here and able to provide information. Pt was independent prior to getting COVID since this time he has required assistance from her. He was hospitalized in Jan and off and on since then with various health issues. He was diagnosed with lung cancer and has had a PET scan which is pending according to wife. She was assisting with his ADL's and provided transportation. Pt was mobile with a rw but was falling. He had [employer] and [employer] provided home O2. Wife concerned about his quality of life and making sure he is comfortable. Wife is retired and in fairly good health. She provided transportation to his appointments. Wife reports they have extended family who are supportive also               Expected Discharge Plan: [place] Barriers to Discharge: Continued Medical Work up   Patient Goals and CMS Choice Patient states their goals for this hospitalization and ongoing recovery are:: Am I going somewhere? Pt somewhat confused , called wife who is coming at 1:00      Expected Discharge Plan and Services Expected Discharge Plan: [place] In-house Referral: Clinical Social Work   Post Acute Care Choice: Home Health, Durable Medical Equipment Living arrangements for the past 2 months: [place]                                      Prior Living Arrangements/Services Living arrangements for the past 2 months: [place] Lives with:: Spouse          Need for Family Participation in Patient Care: Yes (Comment) Care giver support system in place?:  Yes (comment) Current home services: DME, Home PT([facility] and HO[facility] O2 from [place])    Activities of Daily Living Home Assistive Devices/Equipment: Environmental consultant (specify type) ADL Screening (condition at time of admission) Patient's cognitive ability adequate to safely complete daily activities?: No Is the patient deaf or have difficulty hearing?: No Does the patient have difficulty seeing, even when wearing glasses/contacts?: No Does the patient have difficulty concentrating, remembering, or making decisions?: Yes Patient able to express need for assistance with ADLs?: No Does the patient have difficulty dressing or bathing?: Yes Independently performs ADLs?: No Communication: Independent Dressing (OT): Needs assistance Is this a change from baseline?: Change from baseline, expected to last >3 days Grooming: Needs assistance Is this a change from baseline?: Change from baseline, expected to last >3 days Feeding: Independent Bathing: Needs assistance Is this a change from baseline?: Change from baseline, expected to last >3 days Toileting: Needs assistance Is this a change from baseline?: Change from baseline, expected to last >3days In/Out Bed: Needs assistance Is this a change from baseline?: Change from baseline, expected to last >3 days Walks in Home: Needs assistance Is this a change from baseline?: Change from baseline, expected to last >3 days Does the patient have difficulty walking or climbing stairs?: Yes Weakness of Legs: None Weakness of Arms/Hands: None  Permission  Sought/Granted   Permission granted to share information with : Yes, Verbal Permission Granted  Share Information with NAME: Maudie Mercury     Permission granted to share info w Relationship: wife     Emotional Assessment Appearance:: Appears older than stated age Attitude/Demeanor/Rapport: Unable to Assess(confused) Affect (typically observed): Anxious, Flat Orientation: : Oriented to Self      Admission  diagnosis:  Hyponatremia [E87.1] Altered mental status, unspecified altered mental status type [R41.82] Patient Active Problem List   Diagnosis Date Noted  . Generalized weakness 02/09/2020  . Hyponatremia 02/09/2020  . History of COVID-19 pneumonia with hypoxia 02/09/2020  . Acute metabolic encephalopathy 56/31/4970  . Chronic respiratory failure with hypoxia ([place]) 02/09/2020  . Right lower lobe lung mass 01/10/2020  . Acute pulmonary embolism without acute cor pulmonale ([place]) 01/10/2020  . Pneumonia due to COVID-19 virus 12/21/2019  . Acute respiratory failure with hypoxia ([place]) 12/21/2019  . Myasthenia gravis ([place]) 12/21/2019  . Normocytic anemia 12/21/2019  . Osteoporosis 12/21/2019  . PNA (pneumonia) 12/20/2019   PCP:  [place] [facility]:   [facility] [place], [facility] [address] AT [place] [facility] [address] Phone: [phone] Fax: [phone]     Social Determinants of Health (SDOH) Interventions    Readmission Risk Interventions Readmission Risk Prevention Plan 12/30/2019  Transportation Screening Complete  Home Care Screening Complete  Medication Review (RN CM) Complete  Some recent data might be hidden

## 2020-02-10 NOTE — Plan of Care (Signed)
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
## 2020-02-10 NOTE — Progress Notes (Signed)
PHARMACIST - PHYSICIAN COMMUNICATION  CONCERNING:  Enoxaparin (Lovenox) for DVT Prophylaxis    RECOMMENDATION: Patient was prescribed enoxaparin 40 mg q24 hours for VTE prophylaxis.   Filed Weights   02/09/20 1656  Weight: 119 lb 0.8 oz (54 kg)    Body mass index is 19.21 kg/m.  Estimated Creatinine Clearance: 63.8 mL/min (by C-G formula based on SCr of 0.71 mg/dL).   Based on [place] patient is candidate for enoxaparin 30mg  every 24 hours based on weight less than 57kg for men   DESCRIPTION: Pharmacy has adjusted enoxaparin dose per [facility] policy.  Patient is now receiving enoxaparin 30 mg every 24 hours.  Tawnya Crook, PharmD Clinical Pharmacist  02/10/2020 1:59 PM

## 2020-02-11 ENCOUNTER — Observation Stay (HOSPITAL_COMMUNITY)
Admit: 2020-02-11 | Discharge: 2020-02-11 | Disposition: A | Discharge status: Home / Self Care | Payer: Medicare HMO | Attending: Internal Medicine | Admitting: Internal Medicine

## 2020-02-11 ENCOUNTER — Observation Stay: Payer: Medicare HMO

## 2020-02-11 ENCOUNTER — Inpatient Hospital Stay: Payer: Medicare HMO | Admitting: Hematology and Oncology

## 2020-02-11 DIAGNOSIS — Z515 Encounter for palliative care: Secondary | ICD-10-CM | POA: Diagnosis not present

## 2020-02-11 DIAGNOSIS — I6389 Other cerebral infarction: Secondary | ICD-10-CM | POA: Diagnosis not present

## 2020-02-11 DIAGNOSIS — G7000 Myasthenia gravis without (acute) exacerbation: Secondary | ICD-10-CM | POA: Diagnosis not present

## 2020-02-11 DIAGNOSIS — D539 Nutritional anemia, unspecified: Secondary | ICD-10-CM | POA: Diagnosis not present

## 2020-02-11 DIAGNOSIS — R7401 Elevation of levels of liver transaminase levels: Secondary | ICD-10-CM | POA: Diagnosis not present

## 2020-02-11 DIAGNOSIS — B457 Disseminated cryptococcosis: Secondary | ICD-10-CM | POA: Diagnosis present

## 2020-02-11 DIAGNOSIS — L89151 Pressure ulcer of sacral region, stage 1: Secondary | ICD-10-CM | POA: Diagnosis not present

## 2020-02-11 DIAGNOSIS — G4751 Confusional arousals: Secondary | ICD-10-CM

## 2020-02-11 DIAGNOSIS — C3490 Malignant neoplasm of unspecified part of unspecified bronchus or lung: Secondary | ICD-10-CM | POA: Diagnosis present

## 2020-02-11 DIAGNOSIS — F05 Delirium due to known physiological condition: Secondary | ICD-10-CM | POA: Diagnosis present

## 2020-02-11 DIAGNOSIS — I6340 Cerebral infarction due to embolism of unspecified cerebral artery: Secondary | ICD-10-CM | POA: Diagnosis present

## 2020-02-11 DIAGNOSIS — Z66 Do not resuscitate: Secondary | ICD-10-CM | POA: Diagnosis present

## 2020-02-11 DIAGNOSIS — E222 Syndrome of inappropriate secretion of antidiuretic hormone: Secondary | ICD-10-CM | POA: Diagnosis present

## 2020-02-11 DIAGNOSIS — J189 Pneumonia, unspecified organism: Secondary | ICD-10-CM | POA: Diagnosis present

## 2020-02-11 DIAGNOSIS — E876 Hypokalemia: Secondary | ICD-10-CM | POA: Diagnosis not present

## 2020-02-11 DIAGNOSIS — C7931 Secondary malignant neoplasm of brain: Secondary | ICD-10-CM | POA: Diagnosis present

## 2020-02-11 DIAGNOSIS — N3944 Nocturnal enuresis: Secondary | ICD-10-CM | POA: Diagnosis not present

## 2020-02-11 DIAGNOSIS — Z7901 Long term (current) use of anticoagulants: Secondary | ICD-10-CM

## 2020-02-11 DIAGNOSIS — I639 Cerebral infarction, unspecified: Secondary | ICD-10-CM

## 2020-02-11 DIAGNOSIS — R9401 Abnormal electroencephalogram [EEG]: Secondary | ICD-10-CM | POA: Diagnosis present

## 2020-02-11 DIAGNOSIS — G0490 Encephalitis and encephalomyelitis, unspecified: Secondary | ICD-10-CM | POA: Diagnosis not present

## 2020-02-11 DIAGNOSIS — R4182 Altered mental status, unspecified: Secondary | ICD-10-CM

## 2020-02-11 DIAGNOSIS — I77810 Thoracic aortic ectasia: Secondary | ICD-10-CM | POA: Diagnosis present

## 2020-02-11 DIAGNOSIS — Z681 Body mass index (BMI) 19 or less, adult: Secondary | ICD-10-CM | POA: Diagnosis not present

## 2020-02-11 DIAGNOSIS — Z8616 Personal history of COVID-19: Secondary | ICD-10-CM | POA: Diagnosis not present

## 2020-02-11 DIAGNOSIS — R5381 Other malaise: Secondary | ICD-10-CM | POA: Diagnosis present

## 2020-02-11 DIAGNOSIS — E871 Hypo-osmolality and hyponatremia: Secondary | ICD-10-CM

## 2020-02-11 DIAGNOSIS — Z86711 Personal history of pulmonary embolism: Secondary | ICD-10-CM

## 2020-02-11 DIAGNOSIS — M810 Age-related osteoporosis without current pathological fracture: Secondary | ICD-10-CM | POA: Diagnosis present

## 2020-02-11 DIAGNOSIS — R918 Other nonspecific abnormal finding of lung field: Secondary | ICD-10-CM | POA: Diagnosis not present

## 2020-02-11 DIAGNOSIS — E43 Unspecified severe protein-calorie malnutrition: Secondary | ICD-10-CM | POA: Diagnosis not present

## 2020-02-11 DIAGNOSIS — F329 Major depressive disorder, single episode, unspecified: Secondary | ICD-10-CM | POA: Diagnosis present

## 2020-02-11 DIAGNOSIS — R531 Weakness: Secondary | ICD-10-CM

## 2020-02-11 DIAGNOSIS — R64 Cachexia: Secondary | ICD-10-CM | POA: Diagnosis present

## 2020-02-11 DIAGNOSIS — G9341 Metabolic encephalopathy: Secondary | ICD-10-CM

## 2020-02-11 DIAGNOSIS — R410 Disorientation, unspecified: Secondary | ICD-10-CM | POA: Diagnosis not present

## 2020-02-11 DIAGNOSIS — Z8701 Personal history of pneumonia (recurrent): Secondary | ICD-10-CM | POA: Diagnosis not present

## 2020-02-11 DIAGNOSIS — B451 Cerebral cryptococcosis: Secondary | ICD-10-CM | POA: Diagnosis not present

## 2020-02-11 DIAGNOSIS — Z85828 Personal history of other malignant neoplasm of skin: Secondary | ICD-10-CM | POA: Diagnosis not present

## 2020-02-11 DIAGNOSIS — J9611 Chronic respiratory failure with hypoxia: Secondary | ICD-10-CM | POA: Diagnosis present

## 2020-02-11 DIAGNOSIS — R296 Repeated falls: Secondary | ICD-10-CM | POA: Diagnosis present

## 2020-02-11 DIAGNOSIS — D849 Immunodeficiency, unspecified: Secondary | ICD-10-CM | POA: Diagnosis present

## 2020-02-11 LAB — BASIC METABOLIC PANEL
Anion gap: 9 (ref 5–15)
BUN: 12 mg/dL (ref 8–23)
CO2: 26 mmol/L (ref 22–32)
Calcium: 8.9 mg/dL (ref 8.9–10.3)
Chloride: 89 mmol/L — ABNORMAL LOW (ref 98–111)
Creatinine, Ser: 0.77 mg/dL (ref 0.61–1.24)
GFR calc Af Amer: >60 mL/min (ref >60)
GFR calc non Af Amer: >60 mL/min (ref >60)
Glucose, Bld: 107 mg/dL — ABNORMAL HIGH (ref 70–99)
Potassium: 3.5 mmol/L (ref 3.5–5.1)
Sodium: 124 mmol/L — ABNORMAL LOW (ref 135–145)

## 2020-02-11 LAB — CORTISOL: Cortisol, Plasma: 30.2 ug/dL

## 2020-02-11 LAB — SODIUM: Sodium: 121 mmol/L — ABNORMAL LOW (ref 135–145)

## 2020-02-11 MED ORDER — SODIUM CHLORIDE 1 G PO TABS
1.0000 g | ORAL_TABLET | Freq: Three times a day (TID) | ORAL | Status: DC
  Administered 2020-02-11 – 2020-02-24 (×39): 1 g via ORAL
  Filled 2020-02-11 (×41): qty 1

## 2020-02-11 MED ORDER — ADULT MULTIVITAMIN W/MINERALS CH
1.0000 | ORAL_TABLET | Freq: Every day | ORAL | Status: DC
  Administered 2020-02-11 – 2020-02-24 (×13): 1 via ORAL
  Filled 2020-02-11 (×13): qty 1

## 2020-02-11 MED ORDER — ENSURE ENLIVE PO LIQD
237.0000 mL | Freq: Two times a day (BID) | ORAL | Status: DC
  Administered 2020-02-11 – 2020-02-17 (×11): 237 mL via ORAL

## 2020-02-11 NOTE — Progress Notes (Signed)
**Note Duane-Identified via Obfuscation** Progress Note    Duane Santos  XTG:[id number] DOB: 1946-10-27  DOA: 02/09/2020 PCP: [place]      Brief Narrative:    Medical records reviewed and are as summarized below:  Duane Santos is an 74 y.o. male with medical history significant for myasthenia gravis, history of COVID-19 pneumonia with hypoxia diagnosed 12/13/2019, hospitalized from 12/21/2019 to 01/03/2020 discharged on oxygen, currently on O2 at 3 L, who also has a history of right lower lobe mass diagnosed during his last hospitalization, last seen by oncology on 01/28/2020, on Eliquis for PE and DVT diagnosed during his last hospitalization, who presented to the emergency room with two weeks off onset confusion around 01/31/20, associated with persistent protracted weakness and falls since his hospitalization.  History is taken from his wife and caregiver due to patient's confusion.    He has a history of myasthenia gravis on prednisone and he was advised by his neurologist not to take any prednisone more than 20 mg.  Reportedly, prior to his COVID-19 infection, he was very independent but since then he has been slowly declining and has required a walker formulation.      Assessment/Plan:   Principal Problem:   Acute metabolic encephalopathy Active Problems:   Myasthenia gravis ([place])   Right lower lobe lung mass   Generalized weakness   Hyponatremia   History of COVID-19 pneumonia with hypoxia   Chronic respiratory failure with hypoxia (HCC)   Palliative care encounter   Acute confusional state: Intermittent.  Differential diagnosis include acute toxic metabolic encephalopathy, subacute stroke, metastatic disease to the brain.  MRI brain showed several areas of restricted diffusion in the cerebellum bilaterally possibly subacute infarct versus metastatic disease (per radiologist, image quality degraded by motion).  2D echo and carotid duplex have been ordered for further evaluation.  Neurologist  recommend repeat MRI brain in 5 to 7 days.   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  Continue salt tablets.  His wife said patient was taking salt tablets prior to admission.  His wife requested a nephrology consult for further evaluation.  Right lung mass suspicious for malignancy, sclerotic changes of the right third fourth and fifth ribs suspicious for metastatic disease: Follow-up with pulmonologist.  Plan for biopsy noted.  He recommended that Eliquis be held for 7 days prior to biopsy.  Consulted palliative care.  Recent pulmonary embolism and DVT diagnosed on December 23, 2019: Eliquis has been held because of plan for biopsy.  Continue Lovenox bridge for treatment of pulmonary embolism.  Myasthenia gravis: Continue prednisone (his wife said his primary neurologist recommended that the dose should not be below 20 mg daily)  Recent COVID-19 pneumonia/chronic hypoxemic respiratory failure: Continue oxygen via nasal cannula.  Generalized weakness/debility: PT and OT evaluation.  Body mass index is 19.21 kg/m.   Family Communication/Anticipated D/C date and plan/Code Status   DVT prophylaxis: Lovenox Code Status: DNR Family Communication: Plan discussed with his wife at the bedside Disposition Plan: Patient is from home.  Plan to discharge home when work-up confusion and hyponatremia improve and work-up is completed (repeat brain MRI, bronchoscopy).      Subjective:   Patient does not report any complaints.  His wife is at the bedside.  Nursing staff report the patient has had some intermittent confusion.   Objective:    Vitals:   02/10/20 1604 02/10/20 2340 02/11/20 0752 02/11/20 0942  BP: (!) 143/86 134/80 130/84   Pulse: 100 84 96  Resp: 20 17    Temp: 98 F (36.7 C) 99.5 F (37.5 C) 99.1 F (37.3 C)   TempSrc: Oral Oral Oral   SpO2: 100% 100% 100% 98%  Weight:      Height:        Intake/Output Summary (Last 24 hours) at 02/11/2020 1144 Last data  filed at 02/11/2020 2706 Gross per 24 hour  Intake 0 ml  Output 1825 ml  Net -1825 ml   Filed Weights   02/09/20 1656  Weight: 54 kg    Exam:  GEN: NAD SKIN: Abrasion on left arm covered with clean dressing EYES: EOMI ENT: MMM CV: RRR PULM: CTA B ABD: soft, ND, NT, +BS CNS: AAO x 3, non focal EXT: No edema or tenderness   Data Reviewed:   I have personally reviewed following labs and imaging studies:  Labs: Labs show the following:   Basic Metabolic Panel: Recent Labs  Lab 02/09/20 1701 02/09/20 1701 02/10/20 0802 02/11/20 0911  NA 128*  --  129* 124*  K 4.7   < > 3.6 3.5  CL 90*  --  90* 89*  CO2 26  --  29 26  GLUCOSE 137*  --  97 107*  BUN 17  --  11 12  CREATININE 0.90  --  0.71 0.77  CALCIUM 10.1  --  9.2 8.9   < > = values in this interval not displayed.   GFR Estimated Creatinine Clearance: 63.8 mL/min (by C-G formula based on SCr of 0.77 mg/dL). Liver Function Tests: Recent Labs  Lab 02/09/20 1701  AST 27  ALT 31  ALKPHOS 63  BILITOT 0.6  PROT 7.1  ALBUMIN 3.6   No results for input(s): LIPASE, AMYLASE in the last 168 hours. No results for input(s): AMMONIA in the last 168 hours. Coagulation profile Recent Labs  Lab 02/09/20 1701  INR 1.1    CBC: Recent Labs  Lab 02/09/20 1701 02/10/20 0802  WBC 10.6* 10.0  HGB 12.6* 12.8*  HCT 37.6* 37.7*  MCV 98.4 98.2  PLT 322 279   Cardiac Enzymes: No results for input(s): CKTOTAL, CKMB, CKMBINDEX, TROPONINI in the last 168 hours. BNP (last 3 results) No results for input(s): PROBNP in the last 8760 hours. CBG: No results for input(s): GLUCAP in the last 168 hours. D-Dimer: No results for input(s): DDIMER in the last 72 hours. Hgb A1c: No results for input(s): HGBA1C in the last 72 hours. Lipid Profile: No results for input(s): CHOL, HDL, LDLCALC, TRIG, CHOLHDL, LDLDIRECT in the last 72 hours. Thyroid function studies: No results for input(s): TSH, T4TOTAL, T3FREE, THYROIDAB in  the last 72 hours.  Invalid input(s): FREET3 Anemia work up: No results for input(s): VITAMINB12, FOLATE, FERRITIN, TIBC, IRON, RETICCTPCT in the last 72 hours. Sepsis Labs: Recent Labs  Lab 02/09/20 1701 02/10/20 0802  WBC 10.6* 10.0    Microbiology No results found for this or any previous visit (from the past 240 hour(s)).  Procedures and diagnostic studies:  EEG  Result Date: 02/10/2020 Alexis Goodell, MD     02/10/2020  3:11 PM ELECTROENCEPHALOGRAM REPORT Patient: TALLAN SANDOZ       Room #: 136A-AA EEG No. ID: [id number] Age: 74 y.o.        Sex: male Requesting Physician: Mal Misty Report Date:  02/10/2020       Interpreting Physician: Alexis Goodell History: TREYCE SPILLERS is an 74 y.o. male with altered mental status Medications: Prednisone, Thiamine Conditions of Recording:  This is  a 21 channel routine scalp EEG performed with bipolar and monopolar montages arranged in accordance to the international 10/20 system of electrode placement. One channel was dedicated to EKG recording. The patient is in the awake and uncooperative state. Description:  Artifact is prominent during the recording often obscuring the background rhythm. When able to be visualized the background is slow and poorly organized.   It consists of a low voltage, polymorphic delta rhythm that is diffusely distributed and continuous.  Some intermixed poorly organized theta activity is noted at times as well.   No epileptiform activity is noted.  Hyperventilation was not performed.  Intermittent photic stimulation was performed but failed to illicit any change in the tracing. IMPRESSION: This is a technically difficult electroencephalogram secondary to muscle and movement artifact.  When able to be visualized though, the EEG is abnormal secondary to general background slowing.  This finding may be seen with a diffuse disturbance that is etiologically nonspecific, but may include a metabolic encephalopathy, among other  possibilities.  No epileptiform activity was noted.  Alexis Goodell, MD Neurology [phone] 02/10/2020, 3:06 PM   CT Head Wo Contrast  Result Date: 02/09/2020 CLINICAL DATA:  74 year old male with confusion. EXAM: CT HEAD WITHOUT CONTRAST TECHNIQUE: Contiguous axial images were obtained from the base of the skull through the vertex without intravenous contrast. COMPARISON:  None. FINDINGS: Brain: There is mild age-related atrophy and chronic microvascular ischemic changes. There is no acute intracranial hemorrhage. No mass effect or midline shift. No extra-axial fluid collection. Vascular: No hyperdense vessel or unexpected calcification. Skull: Normal. Negative for fracture or focal lesion. Sinuses/Orbits: No acute finding. Other: None IMPRESSION: 1. No acute intracranial pathology. 2. Mild age-related atrophy and chronic microvascular ischemic changes. Electronically Signed   By: Anner Crete M.D.   On: 02/09/2020 18:57   MR BRAIN W WO CONTRAST  Result Date: 02/10/2020 CLINICAL DATA:  Small cell lung cancer EXAM: MRI HEAD WITHOUT AND WITH CONTRAST TECHNIQUE: Multiplanar, multiecho pulse sequences of the brain and surrounding structures were obtained without and with intravenous contrast. CONTRAST:  68mL GADAVIST GADOBUTROL 1 MMOL/ML IV SOLN COMPARISON:  CT head 02/09/2020.  No prior MRI for comparison. FINDINGS: Brain: Image quality degraded by motion. Postcontrast imaging degraded by significant motion. Small cerebellar lesions are present bilaterally on diffusion-weighted imaging. These cannot be confirmed on postcontrast imaging due to small size and motion. Two small 3 mm lesions left inferior cerebellum and small lesion left lateral cerebellum. Ill-defined area of restricted diffusion right posterior cerebellum. These lesions are difficult to see on FLAIR and T2 do not show hemorrhage. Possible recent infarct versus metastatic disease. Follow-up imaging will be necessary to clarify. Mild atrophy.  Mild chronic microvascular ischemic change in the white matter. No areas of hemorrhage Postcontrast imaging degraded by motion. No large enhancing lesion identified. Vascular: Normal arterial flow voids. Skull and upper cervical spine: No focal skull lesion. Sinuses/Orbits: Paranasal sinuses clear.  Bilateral cataract surgery Other: None IMPRESSION: Several small areas of restricted diffusion in the cerebellum bilaterally. Possible subacute infarct versus metastatic disease. Recommend follow-up MRI brain without with contrast when the patient is able to hold still, possibly with sedation Electronically Signed   By: Franchot Gallo M.D.   On: 02/10/2020 13:14    Medications:   . enoxaparin (LOVENOX) injection  1 mg/kg Subcutaneous Q12H  . predniSONE  20 mg Oral Q breakfast  . sodium chloride  1 g Oral BID WC  . thiamine  100 mg Oral Daily  Continuous Infusions:   LOS: 0 days   Jinx Gilden  [facility]     02/11/2020, 11:44 AM

## 2020-02-11 NOTE — Evaluation (Signed)
Occupational Therapy Evaluation Patient Details Name: Duane Santos MRN: [id number] DOB: 1946-08-27 Today's Date: 02/11/2020    History of Present Illness presented to ER secondary to AMS, progressive weakness and repeated falls; admitted for management of acute metabolic encephalopathy.  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 PMH significant for MG, covid-19 infection (1/21) and newly-diagnosed R lung cancer.   Clinical Impression   Patient seen this AM for OT evaluation.  Partial co-tx for optimal performance and safety during functional transfers.  Patient notably confused, but able to state name and that he was in the hospital.  Noted to be anxious and impulsive with behaviors and would perseverate on certain thing such as his catheter.  Overall polite and agreeable to therapy session.  Performed sit to stand and SPT using 2WW with MOD A x 2 due to tremors, impulsivity and posterior lean.  Patient performed simple grooming tasks while seated in recliner with MIN A and MAX verbal/tactile/visual cues for task initiation, maintaining attention to task and task completion.  Difficulty assessing UB strength due to confusion and difficulty following directions. Patient remains high fall risk and recommending 24/7 SPV.  The patient would benefit from continued skilled occupational therapy to address several performance components including: activity tolerance, safety awareness, balance, strengthening, sitting/standing tolerance, ADL retraining, energy conservation techniques, compensatory techniques and family training.  Based on today's performance, the patient would benefit from SNF placement if family desires to pursue rehabilitation.  If family prefers to take him home, with less emphasis on [facility] therapy and increased caregiver support,recommend home with hospital bed, manual WC/cushion, WC, BSC and 24/7 physical assist/caregiver for optimal safety.       Follow Up Recommendations  SNF    Equipment Recommendations  Other (comment)(defer to next level of care)    Recommendations for Other Services       Precautions / Restrictions Precautions Precautions: Fall Restrictions Weight Bearing Restrictions: No      Mobility Bed Mobility           General bed mobility comments: Per PT eval, requires MOD A for bed mobility with poor trunk control.  Heavy posterior lean.  Transfers Overall transfer level: Needs assistance Equipment used: Rolling walker (2 wheeled) Transfers: Sit to/from [facility] Sit to Stand: Mod assist;+2 safety/equipment Stand pivot transfers: Mod assist;+2 safety/equipment       General transfer comment: Requires cues for each step of task (verbal/visual/tactile).  Continues to demo posterior lean in standing.    Balance Overall balance assessment: Needs assistance Sitting-balance support: No upper extremity supported;Feet supported Sitting balance-Leahy Scale: Poor Sitting balance - Comments: Heavy posterior lean.  Unable to keep hands in one location, poor attention to task. Postural control: Posterior lean Standing balance support: Bilateral upper extremity supported Standing balance-Leahy Scale: Zero                             ADL either performed or assessed with clinical judgement   ADL Overall ADL's : Needs assistance/impaired Eating/Feeding: Supervision/ safety;Cueing for sequencing;Sitting Eating/Feeding Details (indicate cue type and reason): Requires SPV to maintain attention to task Grooming: Wash/dry hands;Wash/dry face;Sitting;Set up;Supervision/safety;Oral care;Minimal assistance Grooming Details (indicate cue type and reason): Demonstrates poor ability to initiate task and complete task.  Requires frequent cuing for appropriate timing and maintaining attention to task.             Lower Body Dressing: Moderate assistance;Sit  to/from stand Lower Body  Dressing Details (indicate cue type and reason): Requires increased assist due to posterior lean and poor ability to maintain attention to task Toilet Transfer: RW;Moderate assistance Toilet Transfer Details (indicate cue type and reason): Noted with posterior lean and difficulty sequencing.  Noted impulsivity and pt will reach for other items mid-transfer.         Functional mobility during ADLs: Minimal assistance;Cueing for sequencing;Rolling walker;Cueing for safety;Moderate assistance General ADL Comments: Requires maximal cues to maintain attention to task, appropriate concentration and safety awareness. Noted impulsivity.     Vision   Vision Assessment?: No apparent visual deficits     Perception     Praxis      Pertinent Vitals/Pain Pain Assessment: No/denies pain     Hand Dominance Right   Extremity/Trunk Assessment Upper Extremity Assessment Upper Extremity Assessment: Difficult to assess due to impaired cognition(Difficult to assess as patient unable to follow MMT to his fullest extent d/t confusion)   Lower Extremity Assessment Lower Extremity Assessment: Defer to PT evaluation   Cervical / Trunk Assessment Cervical / Trunk Assessment: Normal   Communication Communication Communication: No difficulties;Other (comment)(Confusion hinders effective communication)   Cognition Arousal/Alertness: Awake/alert Behavior During Therapy: Impulsive;Restless(Patient with difficulty maintaining attention to task.) Overall Cognitive Status: No family/caregiver present to determine baseline cognitive functioning                                 General Comments: Oriented to self.  Patient knows he is in hospital but is unable to verbalize cause.  Able to follow one step commands with use of verbal/visual/tactile cues and requires frequent redirection ti maintain attention to task.  Difficulty with initiation and ending of tasks.   General Comments  Patient is  confused and demonstrates anxious/paranoid behaviors.  Requires cues and SPV for all tasks.    Exercises Other Exercises Other Exercises: Educated patient on general safety using nurse call light and remaining in recliner for assistance Other Exercises: Educated patient to not pull on catheter for safety and to decrease pain Other Exercises: Performed simple BADLs while seated with assistance for all tasks due to impulsivity, restlessness, poor attention to task and poor sequencing. Other Exercises: Performed sitting balance and functional transfers ranging from CGA-MAX due to restlessness and poor sequencing.   Shoulder Instructions      Home Living Family/patient expects to be discharged to:: Private residence Living Arrangements: Spouse/significant other Available Help at Discharge: Family Type of Home: House Home Access: Stairs to enter Technical brewer of Steps: 7 Entrance Stairs-Rails: Can reach both Muddy: One level     Bathroom Shower/Tub: Teacher, early years/pre: Standard Bathroom Accessibility: Yes   Home Equipment: Grab bars - tub/shower   Additional Comments: Home set up taken from previous records; patient signficantly confused and unable to eloborate      Prior Functioning/Environment Level of Independence: Independent        Comments: Per chart, at baseline (prior to COVID), was indep with ADLs, mobility, ambulating 2 miles/day and also with ADLs.  Since covid, has experienced progressive debility and generalized weakness, requiring use of RW and +1 assist at times for ADLs/mobility.  Chart does suggest multiple fall history.        OT Problem List: Decreased strength;Decreased activity tolerance;Impaired balance (sitting and/or standing);Decreased safety awareness;Decreased cognition;Decreased coordination;Decreased knowledge of precautions      OT Treatment/Interventions:  OT Goals(Current goals can be found in the care plan  section) Acute Rehab OT Goals Patient Stated Goal: Unable to state a goal.  Agreeable to therapy session. OT Goal Formulation: With patient Time For Goal Achievement: 02/25/20 Potential to Achieve Goals: Good  OT Frequency: Min 2X/week   Barriers to D/C:            Co-evaluation PT/OT/SLP Co-Evaluation/Treatment: Yes Reason for Co-Treatment: Complexity of the patient's impairments (multi-system involvement);For patient/therapist safety;To address functional/ADL transfers PT goals addressed during session: Mobility/safety with mobility OT goals addressed during session: ADL's and self-care      AM-PAC OT "6 Clicks" Daily Activity     Outcome Measure Help from another person eating meals?: A Little Help from another person taking care of personal grooming?: A Little Help from another person toileting, which includes using toliet, bedpan, or urinal?: A Lot Help from another person bathing (including washing, rinsing, drying)?: A Lot Help from another person to put on and taking off regular upper body clothing?: A Little Help from another person to put on and taking off regular lower body clothing?: A Lot 6 Click Score: 15   End of Session Equipment Utilized During Treatment: Gait belt;Rolling walker  Activity Tolerance: Other (comment)(Limited due to confusion and restlessness.) Patient left: in chair;with call bell/phone within reach;with chair alarm set;Other (comment)(Nurse in room)                   Time: 0814-4818 OT Time Calculation (min): 38 min Charges:  OT General Charges $OT Visit: 1 Visit OT Evaluation $OT Eval Moderate Complexity: 1 Mod OT Treatments $Self Care/Home Management : 23-37 mins $Therapeutic Activity: 8-22 mins  Baldomero Lamy, MS, OTR/L 02/11/20, 2:29 PM

## 2020-02-11 NOTE — Evaluation (Addendum)
Physical Therapy Evaluation Patient Details Name: Duane Santos MRN: [id number] DOB: 1946/09/01 Today's Date: 02/11/2020   History of Present Illness  presented to ER secondary to AMS, progressive weakness and repeated falls; admitted for management of acute metabolic encephalopathy.  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 PMH significant for MG, covid-19 infection (1/21) and newly-diagnosed R lung cancer.  Clinical Impression  Upon evaluation, patient alert and oriented to self only; follows simple commands, but requires frequent repetition and redirection to task for full attention and comprehension.  Generally restless and impulsive, somewhat perseverative (both verbally and motorically) with noted difficulty terminating/alternating tasks.  Bilat UE/LE strength and ROM grossly symmetrical and WFL (at least 4/5); mildly tremulous (R > L) with open-chain activities.  Currently requiring mod assist for bed mobility and unsupported sitting balance; mod assist +2 for sit/stand, standing balance and bed/chair transfer with RW.  Demonstrates very heavy posterior trunk lean/weight shift with absent balance/righting reactions; absent insight and ability to self-correct; constant cuing for walker position and management.  Very high risk for falls; unsafe to attempt without RW and 24/7 hands-on assist. Would benefit from skilled PT to address above deficits and promote optimal return to PLOF.; recommend transition to STR upon discharge from acute hospitalization if patient/family opt to pursue rehabilitation goals.  If patient/family prefer discharge home with less intensive rehab (and increased caregiver support), recommend home with hospital bed, manual WC/cushion, WC, BSC and 24/7 physical assist/caregiver for optimal safety.   Patient suffers from acute metabolic encephalopathy which impairs his/her ability to perform daily activities like toileting, feeding,  dressing, grooming, bathing in the home. A cane, walker, crutch will not resolve the patient's issue with performing activities of daily living. A lightweight wheelchair is required/recommended and will allow patient to safely perform daily activities.   Patient can safely propel the wheelchair in the home or has a caregiver who can provide assistance.       Follow Up Recommendations SNF    Equipment Recommendations  Rolling walker with 5" wheels;3in1 (PT)    Recommendations for Other Services       Precautions / Restrictions Precautions Precautions: Fall Restrictions Weight Bearing Restrictions: No      Mobility  Bed Mobility Overal bed mobility: Needs Assistance Bed Mobility: Supine to Sit     Supine to sit: Mod assist     General bed mobility comments: posterior trunk lean/weight shift, poor ability to self-right/correct; very uncomfortable in groin area  Transfers Overall transfer level: Needs assistance Equipment used: Rolling walker (2 wheeled) Transfers: Sit to/from Stand Sit to Stand: Mod assist;+2 safety/equipment         General transfer comment: step by step cuing for task sequencing, safety and overall balance  Ambulation/Gait Ambulation/Gait assistance: Mod assist;+2 safety/equipment Gait Distance (Feet): 5 Feet Assistive device: Rolling walker (2 wheeled)       General Gait Details: significant posterior trunk lean/weight shift, absent awareness and attempts at self correction.  Very choppy stepping pattern with limited LE control (as far as placement).  Consistent cuing for walker position and management.  Poor balance, high fall risk; unsafe to attempt without RW and +1 at all times.  Stairs            Wheelchair Mobility    Modified Rankin (Stroke Patients Only)       Balance Overall balance assessment: Needs assistance Sitting-balance support: No upper extremity supported;Feet supported Sitting balance-Leahy Scale: Poor    Postural control:  Posterior lean Standing balance support: Bilateral upper extremity supported Standing balance-Leahy Scale: Zero                               Pertinent Vitals/Pain      Home Living Family/patient expects to be discharged to:: Private residence Living Arrangements: Spouse/significant other Available Help at Discharge: Family Type of Home: House Home Access: Stairs to enter Entrance Stairs-Rails: Can reach both Entrance Stairs-Number of Steps: 7 Home Layout: One level Home Equipment: Grab bars - tub/shower Additional Comments: Home set up taken from previous records; patient signficantly confused and unable to eloborate    Prior Function Level of Independence: Independent         Comments: Per chart, at baseline (prior to COVID), was indep with ADLs, mobility, ambulating 2 miles/day.  Since covid, has experienced progressive debility and generalized weakness, requiring use of RW and +1 assist at times for ADLs/mobility.  Chart does suggest multiple fall history.     Hand Dominance        Extremity/Trunk Assessment   Upper Extremity Assessment Upper Extremity Assessment: (grossly 4/5 throughout, generally tremulous; no obvious sensory deficits noted)    Lower Extremity Assessment Lower Extremity Assessment: (grossly 4/5 throughout, no obvious sensory deficit noted)       Communication   Communication: No difficulties(speech clear and fluent; confusion limits ability to formally maintain conversation)  Cognition Arousal/Alertness: Awake/alert Behavior During Therapy: Impulsive                                   General Comments: oriented to self only; does follow simple commands, but often requires redirection and repetition of instruction; generally impulsive, poor insight into situation, deficits.  Generally perseverative, both verbally and motorically; difficulty switching between tasks.      General Comments       Exercises Other Exercises Other Exercises: Unsupported sitting edge of bed, mod assist for forward trunk lean and midline orientation in A/P plane.  Briefly improves to cga/min assist at times, but unable to maintain for participation with dual-task activities or divided attention.  Generally restless and impulsive in sitting position.   Assessment/Plan    PT Assessment Patient needs continued PT services  PT Problem List Decreased strength;Decreased range of motion;Decreased activity tolerance;Decreased balance;Decreased mobility;Decreased coordination;Decreased cognition;Decreased knowledge of use of DME;Decreased safety awareness;Decreased knowledge of precautions;Cardiopulmonary status limiting activity       PT Treatment Interventions DME instruction;Gait training;Functional mobility training;Therapeutic activities;Therapeutic exercise;Balance training;Neuromuscular re-education;Cognitive remediation;Patient/family education    PT Goals (Current goals can be found in the Care Plan section)  Acute Rehab PT Goals Patient Stated Goal: agreeable to session PT Goal Formulation: With patient Time For Goal Achievement: 02/25/20 Potential to Achieve Goals: Fair    Frequency Min 2X/week   Barriers to discharge Decreased caregiver support      Co-evaluation PT/OT/SLP Co-Evaluation/Treatment: Yes Reason for Co-Treatment: Complexity of the patient's impairments (multi-system involvement);For patient/therapist safety;To address functional/ADL transfers PT goals addressed during session: Mobility/safety with mobility OT goals addressed during session: ADL's and self-care       AM-PAC PT "6 Clicks" Mobility  Outcome Measure Help needed turning from your back to your side while in a flat bed without using bedrails?: A Little Help needed moving from lying on your back to sitting on the side of a flat bed without using bedrails?: A Lot Help  needed moving to and from a bed to a chair  (including a wheelchair)?: A Lot Help needed standing up from a chair using your arms (e.g., wheelchair or bedside chair)?: A Lot Help needed to walk in hospital room?: A Lot Help needed climbing 3-5 steps with a railing? : Total 6 Click Score: 12    End of Session Equipment Utilized During Treatment: Gait belt Activity Tolerance: Patient tolerated treatment well Patient left: in chair;with call bell/phone within reach(OT at bedside to complete evaluation) Nurse Communication: Mobility status PT Visit Diagnosis: Muscle weakness (generalized) (M62.81);Other abnormalities of gait and mobility (R26.89);Unsteadiness on feet (R26.81);Other symptoms and signs involving the nervous system (R29.898);Repeated falls (R29.6)    Time: 7096-2836 PT Time Calculation (min) (ACUTE ONLY): 21 min   Charges:   PT Evaluation $PT Eval Moderate Complexity: 1 Mod PT Treatments $Therapeutic Activity: 8-22 mins        Amberlee Garvey H. Owens Shark, PT, DPT, NCS 02/11/20, 9:59 AM [phone]

## 2020-02-11 NOTE — Progress Notes (Addendum)
Subjective: Patient remains confused.  Working with therapy.    Objective: Current vital signs: BP 130/84 (BP Location: Right Arm)   Pulse 96   Temp 99.1 F (37.3 C) (Oral)   Resp 17   Ht 5\' 6"  (1.676 m)   Wt 54 kg   SpO2 98%   BMI 19.21 kg/m  Vital signs in last 24 hours: Temp:  [98 F (36.7 C)-99.5 F (37.5 C)] 99.1 F (37.3 C) (03/05 0752) Pulse Rate:  [84-100] 96 (03/05 0752) Resp:  [17-20] 17 (03/04 2340) BP: (130-143)/(80-86) 130/84 (03/05 0752) SpO2:  [98 %-100 %] 98 % (03/05 0942)  Intake/Output from previous day: 03/04 0701 - 03/05 0700 In: 360 [P.O.:360] Out: 1575 [Urine:1575] Intake/Output this shift: Total I/O In: -  Out: 250 [Urine:250] Nutritional status:  Diet Order            Diet Heart Room service appropriate? Yes; Fluid consistency: Thin  Diet effective now              Neurologic Exam: Mental Status: Alert, oriented X 4.  At times does not answer questioning and just stares ahead.  Speech fluent without evidence of aphasia but has some evidence of apraxia.  Able to follow 3 step commands without difficulty. Cranial Nerves: II: Visual fields grossly normal, pupils equal, round, reactive to light and accommodation III,IV, VI: ptosis not present, extra-ocular motions intact bilaterally V,VII: smile symmetric, facial light touch sensation normal bilaterally VIII: hearing normal bilaterally IX,X: gag reflex present XI: bilateral shoulder shrug XII: midline tongue extension Motor: Able to lift all extremities against gravity and maintain with some mild generalized weakness noted  Lab Results: Basic Metabolic Panel: Recent Labs  Lab 02/09/20 1701 02/10/20 0802 02/11/20 0911  NA 128* 129* 124*  K 4.7 3.6 3.5  CL 90* 90* 89*  CO2 26 29 26   GLUCOSE 137* 97 107*  BUN 17 11 12   CREATININE 0.90 0.71 0.77  CALCIUM 10.1 9.2 8.9    Liver Function Tests: Recent Labs  Lab 02/09/20 1701  AST 27  ALT 31  ALKPHOS 63  BILITOT 0.6  PROT  7.1  ALBUMIN 3.6   No results for input(s): LIPASE, AMYLASE in the last 168 hours. No results for input(s): AMMONIA in the last 168 hours.  CBC: Recent Labs  Lab 02/09/20 1701 02/10/20 0802  WBC 10.6* 10.0  HGB 12.6* 12.8*  HCT 37.6* 37.7*  MCV 98.4 98.2  PLT 322 279    Cardiac Enzymes: No results for input(s): CKTOTAL, CKMB, CKMBINDEX, TROPONINI in the last 168 hours.  Lipid Panel: No results for input(s): CHOL, TRIG, HDL, CHOLHDL, VLDL, LDLCALC in the last 168 hours.  CBG: No results for input(s): GLUCAP in the last 168 hours.  Microbiology: Results for orders placed or performed during the hospital encounter of 12/20/19  Blood Culture (routine x 2)     Status: None   Collection Time: 12/20/19 12:59 PM   Specimen: BLOOD  Result Value Ref Range Status   Specimen Description BLOOD RIGHT [facility]  Final   Special Requests   Final    BOTTLES DRAWN AEROBIC AND ANAEROBIC Blood Culture results may not be optimal due to an excessive volume of blood received in culture bottles   Culture   Final    NO GROWTH 5 DAYS Performed at [facility], [address], [place],  [address]    Report Status 12/25/2019 FINAL  Final  Blood Culture (routine x 2)     Status:  None   Collection Time: 12/20/19  1:00 PM   Specimen: BLOOD  Result Value Ref Range Status   Specimen Description BLOOD BLOOD RIGHT HAND  Final   Special Requests   Final    BOTTLES DRAWN AEROBIC AND ANAEROBIC Blood Culture adequate volume   Culture   Final    NO GROWTH 5 DAYS Performed at [facility], [address] [place], [place] [address]    Report Status 12/25/2019 FINAL  Final  Urine culture     Status: None   Collection Time: 12/20/19  1:00 PM   Specimen: In/Out Cath Urine  Result Value Ref Range Status   Specimen Description   Final    IN/OUT CATH URINE Performed at [facility], [address] [place], [place] [address]    Special Requests   Final    NONE Performed  at [facility], [address] [place], [place] [address]    Culture   Final    NO GROWTH Performed at [facility], [place] [address] [place], [place] [address]    Report Status 12/21/2019 FINAL  Final    Coagulation Studies: Recent Labs    02/09/20 1701  LABPROT 14.2  INR 1.1    Imaging: EEG  Result Date: 02/10/2020 Alexis Goodell, MD     02/10/2020  3:11 PM ELECTROENCEPHALOGRAM REPORT Patient: Duane Santos       Room #: 136A-AA EEG No. ID: [id number] Age: 74 y.o.        Sex: male Requesting Physician: Mal Misty Report Date:  02/10/2020       Interpreting Physician: Alexis Goodell History: KRISTEN BUSHWAY is an 74 y.o. male with altered mental status Medications: Prednisone, Thiamine Conditions of Recording:  This is a 21 channel routine scalp EEG performed with bipolar and monopolar montages arranged in accordance to the international 10/20 system of electrode placement. One channel was dedicated to EKG recording. The patient is in the awake and uncooperative state. Description:  Artifact is prominent during the recording often obscuring the background rhythm. When able to be visualized the background is slow and poorly organized.   It consists of a low voltage, polymorphic delta rhythm that is diffusely distributed and continuous.  Some intermixed poorly organized theta activity is noted at times as well.   No epileptiform activity is noted.  Hyperventilation was not performed.  Intermittent photic stimulation was performed but failed to illicit any change in the tracing. IMPRESSION: This is a technically difficult electroencephalogram secondary to muscle and movement artifact.  When able to be visualized though, the EEG is abnormal secondary to general background slowing.  This finding may be seen with a diffuse disturbance that is etiologically nonspecific, but may include a metabolic encephalopathy, among other possibilities.  No epileptiform activity was noted.  Alexis Goodell,  MD Neurology [phone] 02/10/2020, 3:06 PM   CT Head Wo Contrast  Result Date: 02/09/2020 CLINICAL DATA:  74 year old male with confusion. EXAM: CT HEAD WITHOUT CONTRAST TECHNIQUE: Contiguous axial images were obtained from the base of the skull through the vertex without intravenous contrast. COMPARISON:  None. FINDINGS: Brain: There is mild age-related atrophy and chronic microvascular ischemic changes. There is no acute intracranial hemorrhage. No mass effect or midline shift. No extra-axial fluid collection. Vascular: No hyperdense vessel or unexpected calcification. Skull: Normal. Negative for fracture or focal lesion. Sinuses/Orbits: No acute finding. Other: None IMPRESSION: 1. No acute intracranial pathology. 2. Mild age-related atrophy and chronic microvascular ischemic changes. Electronically  Signed   By: Anner Crete M.D.   On: 02/09/2020 18:57   MR BRAIN W WO CONTRAST  Result Date: 02/10/2020 CLINICAL DATA:  Small cell lung cancer EXAM: MRI HEAD WITHOUT AND WITH CONTRAST TECHNIQUE: Multiplanar, multiecho pulse sequences of the brain and surrounding structures were obtained without and with intravenous contrast. CONTRAST:  22mL GADAVIST GADOBUTROL 1 MMOL/ML IV SOLN COMPARISON:  CT head 02/09/2020.  No prior MRI for comparison. FINDINGS: Brain: Image quality degraded by motion. Postcontrast imaging degraded by significant motion. Small cerebellar lesions are present bilaterally on diffusion-weighted imaging. These cannot be confirmed on postcontrast imaging due to small size and motion. Two small 3 mm lesions left inferior cerebellum and small lesion left lateral cerebellum. Ill-defined area of restricted diffusion right posterior cerebellum. These lesions are difficult to see on FLAIR and T2 do not show hemorrhage. Possible recent infarct versus metastatic disease. Follow-up imaging will be necessary to clarify. Mild atrophy. Mild chronic microvascular ischemic change in the white matter. No  areas of hemorrhage Postcontrast imaging degraded by motion. No large enhancing lesion identified. Vascular: Normal arterial flow voids. Skull and upper cervical spine: No focal skull lesion. Sinuses/Orbits: Paranasal sinuses clear.  Bilateral cataract surgery Other: None IMPRESSION: Several small areas of restricted diffusion in the cerebellum bilaterally. Possible subacute infarct versus metastatic disease. Recommend follow-up MRI brain without with contrast when the patient is able to hold still, possibly with sedation Electronically Signed   By: Franchot Gallo M.D.   On: 02/10/2020 13:14    Medications:  I have reviewed the patient's current medications. Scheduled: . enoxaparin (LOVENOX) injection  1 mg/kg Subcutaneous Q12H  . predniSONE  20 mg Oral Q breakfast  . sodium chloride  1 g Oral BID WC  . 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 Patient hyponatremic. MRI of the brain personally reviewed and shows several small areas of restricted diffusion in the cerebellum bilaterally.  Likely acute/subacute infarcts but can not rule out metastasis.  Results discussed with hospitalist and patient restarted on anticoagulation.   Presenting symptoms likely multifactorial in etiology and related to recent COVID infection, hyponatremia and malignancy.    Recommendations: 1. Echocardiogram with bubble study 2. CTA of the head and neck for better visualization of the posterior circulation 3. Telemetry 4. Frequent neurochecks 5. A1c, lipid panel 6.  PT consult, OT consult, Speech consult 7. Repeat MRI in 5-7 days  Case discussed with wife    LOS: 0 days   Alexis Goodell, MD Neurology [phone] 02/11/2020  10:25 AM

## 2020-02-11 NOTE — TOC Progression Note (Signed)
Transition of Care [facility]) - Progression Note    Patient Details  Name: Duane Santos MRN: [id number] Date of Birth: Mar 09, 1946  Transition of Care [facility]) CM/SW Contact  Purnell Daigle, Gardiner Rhyme, LCSW Phone Number: 02/11/2020, 2:09 PM  Clinical Narrative:  Met with wife to discuss discharge needs. She is upset MD has taken him off his sodium tablets that is the reason he is here. She realizes he is too much care for her right now but does want to take him home. Have discussed private duty agencies and have given her the list of agencies to pursue hiring assist. She feels at home he will try to get up more and may be more aggaitated with her, since he knows her. She wants to talk with dietician and have contacted [phone] to come up and talk with her. She is debating whether she wants him to go to another hospital and is taking with MD regarding this. She did talk with Josh-Palliative care this am. He is active with [facility] prior to admission. Will work on safe discharge plan for both of them. Kristen-PT has let this worker know what equipment she recommends for pt if he goes home. Will continue to work on safe discharge plan for pt and wife.     Expected Discharge Plan: [place] Barriers to Discharge: Continued Medical Work up  Expected Discharge Plan and Services Expected Discharge Plan: [place] In-house Referral: Clinical Social Work   Post Acute Care Choice: Home Health, Durable Medical Equipment Living arrangements for the past 2 months: Single Family Home                                       Social Determinants of Health (SDOH) Interventions    Readmission Risk Interventions Readmission Risk Prevention Plan 12/30/2019  Transportation Screening Complete  Home Care Screening Complete  Medication Review (RN CM) Complete  Some recent data might be hidden

## 2020-02-11 NOTE — Progress Notes (Signed)
*  PRELIMINARY RESULTS* Echocardiogram 2D Echocardiogram has been performed.  Duane Santos 02/11/2020, 2:10 PM
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
## 2020-02-11 NOTE — Consult Note (Signed)
WOC Nurse Consult Note: Reason for Consult: Consult requested for left arm; pt fell prior to admission and has a full thickness abrasion to left anterior arm Measurement: 4X3.5X.2cm Wound bed: skin flap approximated over 80% of the wound, 20% dark red and dry; no odor, drainage, fluctuance or bleeding Dressing procedure/placement/frequency: Topical treatment orders provided for the bedside nurses to perform daily as follows to promote moist healing and minimize discomfort with dressing changes: Apply double-folded xeroform to left arm Q day, then cover with foam dressing.  (Change foam dressing Q 3 days or PRN soiling.) Please re-consult if further assistance is needed.  Thank-you,  Julien Girt MSN, [place], [place], [place], [place]

## 2020-02-11 NOTE — Progress Notes (Signed)
D: Pt alert and oriented x 1. Pt denies experiencing any pain at this time.   A: Scheduled medications administered to pt, per MD orders. Support and encouragement provided. Frequent verbal contact made.   R: No adverse drug reactions noted. Pt complaint with medications and treatment plan. Pt interacts well with staff on the unit. Pt is stable at this time, Will continue to monitor and provide care for as ordered.

## 2020-02-11 NOTE — Progress Notes (Signed)
Initial Nutrition Assessment  DOCUMENTATION CODES:   Not applicable  INTERVENTION:  Will downgrade diet to dysphagia 3 (mechanical soft) with thin liquids.  Provide Ensure Enlive po BID, each supplement provides 350 kcal and 20 grams of protein. Patient prefers vanilla and prefers to have it mixed with whole milk.  Provide Magic cup TID with meals, each supplement provides 290 kcal and 9 grams of protein. Patient prefers vanilla.  Provide daily MVI.  Provide 1:1 assistance at meals if wife is not available to assist.  NUTRITION DIAGNOSIS:   Increased nutrient needs related to catabolic illness(recent IHKVQ-25 PNA, right lower lobe mass suspicious for malignancy) as evidenced by estimated needs.  GOAL:   Patient will meet greater than or equal to 90% of their needs  MONITOR:   PO intake, Supplement acceptance, Labs, Weight trends, I & O's  REASON FOR ASSESSMENT:   Malnutrition Screening Tool    ASSESSMENT:   74 year old male with PMHx of myasthenia gravis, hx of COVID-19 PNA diagnosed 12/13/2019, right lower lobe mass suspicious for malignancy with sclerotic changes of right 3rd, 4th, and 5th ribs suspicious for metastatic disease, PE and DVT on Eliquis admitted with acute confusional state, worsening hyponatremia.   Attempted to assess patient multiple times throughout the day but at each attempt there was another provider in the room. Patient was getting a procedure completed at last attempt but wife was available in room and able to talk to RD regarding patient's nutrition/weight history. She reports patient has lost a significant amount of weight and they have been working on eating high-calorie, high-protein meals and snacks to help with weight gain. Patient used to eat 2 meals per day plus snacks. Lately wife has been making him 3 meals per day plus snacks. He will eat fried eggs, soup, potatoes. He will drink oral nutrition supplements if they are given with milk in them.  Patient will require assistance at meals. He also will need a mechanical soft diet. Patient's wife wanted heart healthy restrictions liberalized and RD agrees patient does not need heart healthy restriction at this time. Wife also reports patient would likely enjoy [employer].   Patient has been losing weight quickly. In December 2020 patient was 125 lbs (56.8 kg). According to chart patient was 56.7 kg on 12/20/2019. He is now listed as being 54 kg (119.05 lbs) but unsure if this was truly a measured weight. He has lost 2.7 kg (4.8% body weight) over the past 2 months, which is not significant for time frame. Wife reports patient is more likely around 112 lbs now.  Medications reviewed and include: prednisone 20 mg daily, thiamine 100 mg daily.  Labs reviewed: Sodium 124, Chloride 89.  RD suspects patient is malnourished. Unable to verify if he meets criteria for malnutrition without completing NFPE.  NUTRITION - FOCUSED PHYSICAL EXAM:  Unable to complete at this time as patient was having a procedure.  Diet Order:   Diet Order            Diet Heart Room service appropriate? Yes; Fluid consistency: Thin  Diet effective now             EDUCATION NEEDS:   No education needs have been identified at this time  Skin:  Skin Assessment: Reviewed RN Assessment  Last BM:  02/08/2020 per chart  Height:   Ht Readings from Last 1 Encounters:  02/09/20 5\' 6"  (1.676 m)   Weight:   Wt Readings from Last 1 Encounters:  02/09/20 54 kg   Ideal Body Weight:  64.5 kg  BMI:  Body mass index is 19.21 kg/m.  Estimated Nutritional Needs:   Kcal:  1600-1800  Protein:  80-90 grams  Fluid:  1.2-1.5 L/day  Jacklynn Barnacle, MS, RD, LDN Pager number available on Amion
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
## 2020-02-12 ENCOUNTER — Encounter: Payer: Self-pay | Admitting: Internal Medicine

## 2020-02-12 ENCOUNTER — Inpatient Hospital Stay: Payer: Medicare HMO

## 2020-02-12 DIAGNOSIS — J189 Pneumonia, unspecified organism: Secondary | ICD-10-CM

## 2020-02-12 LAB — URINALYSIS, ROUTINE W REFLEX MICROSCOPIC
Bilirubin Urine: NEGATIVE
Glucose, UA: NEGATIVE mg/dL
Hgb urine dipstick: NEGATIVE
Ketones, ur: 5 mg/dL — AB
Leukocytes,Ua: NEGATIVE
Nitrite: NEGATIVE
Protein, ur: NEGATIVE mg/dL
Specific Gravity, Urine: 1.023 (ref 1.005–1.030)
pH: 6 (ref 5.0–8.0)

## 2020-02-12 LAB — BASIC METABOLIC PANEL
Anion gap: 7 (ref 5–15)
BUN: 17 mg/dL (ref 8–23)
CO2: 28 mmol/L (ref 22–32)
Calcium: 9 mg/dL (ref 8.9–10.3)
Chloride: 94 mmol/L — ABNORMAL LOW (ref 98–111)
Creatinine, Ser: 0.77 mg/dL (ref 0.61–1.24)
GFR calc Af Amer: >60 mL/min (ref >60)
GFR calc non Af Amer: >60 mL/min (ref >60)
Glucose, Bld: 110 mg/dL — ABNORMAL HIGH (ref 70–99)
Potassium: 3.7 mmol/L (ref 3.5–5.1)
Sodium: 129 mmol/L — ABNORMAL LOW (ref 135–145)

## 2020-02-12 MED ORDER — VANCOMYCIN HCL 1250 MG/250ML IV SOLN
1250.0000 mg | Freq: Once | INTRAVENOUS | Status: AC
  Administered 2020-02-12: 1250 mg via INTRAVENOUS
  Filled 2020-02-12: qty 250

## 2020-02-12 MED ORDER — VANCOMYCIN HCL 1250 MG/250ML IV SOLN
1250.0000 mg | INTRAVENOUS | Status: DC
  Filled 2020-02-12: qty 250

## 2020-02-12 MED ORDER — IOHEXOL 350 MG/ML SOLN
75.0000 mL | Freq: Once | INTRAVENOUS | Status: AC | PRN
  Administered 2020-02-12: 75 mL via INTRAVENOUS

## 2020-02-12 MED ORDER — SODIUM CHLORIDE 0.9 % IV SOLN
2.0000 g | Freq: Three times a day (TID) | INTRAVENOUS | Status: DC
  Administered 2020-02-12 – 2020-02-14 (×5): 2 g via INTRAVENOUS
  Filled 2020-02-12 (×7): qty 2

## 2020-02-12 NOTE — Plan of Care (Signed)
  Problem: Education: Goal: Knowledge of General Education information will improve Description: Including pain rating scale, medication(s)/side effects and non-pharmacologic comfort measures 02/12/2020 0425 by Rollen Sox, RN Outcome: Progressing 02/12/2020 0424 by Rollen Sox, RN Outcome: Progressing   Problem: Clinical Measurements: Goal: Ability to maintain clinical measurements within normal limits will improve 02/12/2020 0425 by Rollen Sox, RN Outcome: Progressing 02/12/2020 0424 by Rollen Sox, RN Outcome: Progressing Goal: Will remain free from infection 02/12/2020 0425 by Rollen Sox, RN Outcome: Progressing 02/12/2020 0424 by Rollen Sox, RN Outcome: Progressing Goal: Diagnostic test results will improve 02/12/2020 0425 by Rollen Sox, RN Outcome: Progressing 02/12/2020 0424 by Rollen Sox, RN Outcome: Progressing Goal: Respiratory complications will improve Outcome: Progressing Goal: Cardiovascular complication will be avoided Outcome: Progressing   Problem: Activity: Goal: Risk for activity intolerance will decrease 02/12/2020 0425 by Rollen Sox, RN Outcome: Progressing 02/12/2020 0424 by Rollen Sox, RN Outcome: Progressing   Problem: Coping: Goal: Level of anxiety will decrease 02/12/2020 0425 by Rollen Sox, RN Outcome: Progressing 02/12/2020 0424 by Rollen Sox, RN Outcome: Progressing   Problem: Pain Managment: Goal: General experience of comfort will improve 02/12/2020 0425 by Rollen Sox, RN Outcome: Progressing 02/12/2020 0424 by Rollen Sox, RN Outcome: Progressing

## 2020-02-12 NOTE — Progress Notes (Signed)
Progress Note    Duane Santos  PPJ:[id number] DOB: 09/27/1946  DOA: 02/09/2020 PCP: [facility]      Brief Narrative:    Medical records reviewed and are as summarized below:  Duane Santos is an 74 y.o. male with medical history significant for myasthenia gravis, history of COVID-19 pneumonia with hypoxia diagnosed 12/13/2019, hospitalized from 12/21/2019 to 01/03/2020 discharged on oxygen, currently on O2 at 3 L, who also has a history of right lower lobe mass diagnosed during his last hospitalization, last seen by oncology on 01/28/2020, on Eliquis for PE and DVT diagnosed during his last hospitalization, who presented to the emergency room with two weeks off onset confusion around 01/31/20, associated with persistent protracted weakness and falls since his hospitalization.  History is taken from his wife and caregiver due to patient's confusion.    He has a history of myasthenia gravis on prednisone and he was advised by his neurologist not to take any prednisone more than 20 mg.  Reportedly, prior to his COVID-19 infection, he was very independent but since then he has been slowly declining and has required a walker formulation.      Assessment/Plan:   Principal Problem:   Acute metabolic encephalopathy Active Problems:   Myasthenia gravis ([place])   Right lower lobe lung mass   Generalized weakness   Hyponatremia   History of COVID-19 pneumonia with hypoxia   Chronic respiratory failure with hypoxia (HCC)   Palliative care encounter   Acute confusional state   Acute confusional state: Intermittent.  Differential diagnosis include acute toxic metabolic encephalopathy, subacute stroke.  His wife requested test for Lyme disease because because patient was an outdoors person and had had tick bites in the past.  She also requested that frequency of vital signs and neuro checks decreased because she prefer that patient be allowed to sleep continuously for 8 or 10  hours.  Acute embolic stroke: Case was discussed with neurologist, Dr. Irish Elders, and he thinks the MRI findings are suggestive of embolic stroke.  He said there was no need to repeat MRI brain.  He had discussed this with the patient's wife who is agreeable.  2D echo showed EF estimated 65 to 70% and carotid duplex did not show any significant atherosclerotic plaque or stenosis.  Hyponatremia: Sodium is better today.  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  Continue salt tablets.  Monitor BMP.  Right lung mass suspicious for malignancy, sclerotic changes of the right third fourth and fifth ribs suspicious for metastatic disease: Follow-up with pulmonologist.  Plan for biopsy noted.  He recommended that Eliquis be held for 7 days prior to biopsy.  Follow-up with palliative care.  Recent pulmonary embolism and DVT diagnosed on December 23, 2019: Eliquis has been held because of plan for biopsy.  Continue Lovenox bridge for treatment of pulmonary embolism.  Myasthenia gravis: Continue prednisone (his wife said his primary neurologist recommended that the dose should not be below 20 mg daily)  Recent COVID-19 pneumonia/chronic hypoxemic respiratory failure: Continue oxygen via nasal cannula.  Generalized weakness/debility: PT and OT evaluation.  Body mass index is 19.21 kg/m.   Family Communication/Anticipated D/C date and plan/Code Status   DVT prophylaxis: Lovenox Code Status: DNR Family Communication: Plan discussed with his wife at the bedside Disposition Plan: Patient is from home.  Plan to discharge home when confusion and hyponatremia improve      Subjective:   No complaints.  He said he feels better today.  Objective:    Vitals:   02/11/20 0942 02/11/20 1500 02/11/20 2300 02/12/20 0715  BP:  128/84 135/84 127/74  Pulse:   97 97  Resp:   16 16  Temp:  99.4 F (37.4 C) 99 F (37.2 C) 98.3 F (36.8 C)  TempSrc:  Axillary Oral   SpO2: 98% 100% 99% 100%  Weight:      Height:         Intake/Output Summary (Last 24 hours) at 02/12/2020 1240 Last data filed at 02/12/2020 1004 Gross per 24 hour  Intake 350 ml  Output 1500 ml  Net -1150 ml   Filed Weights   02/09/20 1656  Weight: 54 kg    Exam:  GEN: No acute distress SKIN: Abrasion on left arm covered with clean dressing EYES: EOMI ENT: MMM CV: RRR PULM: No wheezing or rales heard ABD: soft, ND, NT, +BS CNS: AAO x 3, some confusion noted, non focal EXT: No edema or tenderness   Data Reviewed:   I have personally reviewed following labs and imaging studies:  Labs: Labs show the following:   Basic Metabolic Panel: Recent Labs  Lab 02/09/20 1701 02/09/20 1701 02/10/20 0802 02/10/20 0802 02/11/20 0911 02/11/20 2152 02/12/20 0503  NA 128*  --  129*  --  124* 121* 129*  K 4.7   < > 3.6   < > 3.5  --  3.7  CL 90*  --  90*  --  89*  --  94*  CO2 26  --  29  --  26  --  28  GLUCOSE 137*  --  97  --  107*  --  110*  BUN 17  --  11  --  12  --  17  CREATININE 0.90  --  0.71  --  0.77  --  0.77  CALCIUM 10.1  --  9.2  --  8.9  --  9.0   < > = values in this interval not displayed.   GFR Estimated Creatinine Clearance: 63.8 mL/min (by C-G formula based on SCr of 0.77 mg/dL). Liver Function Tests: Recent Labs  Lab 02/09/20 1701  AST 27  ALT 31  ALKPHOS 63  BILITOT 0.6  PROT 7.1  ALBUMIN 3.6   No results for input(s): LIPASE, AMYLASE in the last 168 hours. No results for input(s): AMMONIA in the last 168 hours. Coagulation profile Recent Labs  Lab 02/09/20 1701  INR 1.1    CBC: Recent Labs  Lab 02/09/20 1701 02/10/20 0802  WBC 10.6* 10.0  HGB 12.6* 12.8*  HCT 37.6* 37.7*  MCV 98.4 98.2  PLT 322 279   Cardiac Enzymes: No results for input(s): CKTOTAL, CKMB, CKMBINDEX, TROPONINI in the last 168 hours. BNP (last 3 results) No results for input(s): PROBNP in the last 8760 hours. CBG: No results for input(s): GLUCAP in the last 168 hours. D-Dimer: No results for input(s):  DDIMER in the last 72 hours. Hgb A1c: No results for input(s): HGBA1C in the last 72 hours. Lipid Profile: No results for input(s): CHOL, HDL, LDLCALC, TRIG, CHOLHDL, LDLDIRECT in the last 72 hours. Thyroid function studies: No results for input(s): TSH, T4TOTAL, T3FREE, THYROIDAB in the last 72 hours.  Invalid input(s): FREET3 Anemia work up: No results for input(s): VITAMINB12, FOLATE, FERRITIN, TIBC, IRON, RETICCTPCT in the last 72 hours. Sepsis Labs: Recent Labs  Lab 02/09/20 1701 02/10/20 0802  WBC 10.6* 10.0    Microbiology No results found for this or any previous visit (  from the past 240 hour(s)).  Procedures and diagnostic studies:  EEG  Result Date: 02/10/2020 Alexis Goodell, MD     02/10/2020  3:11 PM ELECTROENCEPHALOGRAM REPORT Patient: MANOAH DECKARD       Room #: 136A-AA EEG No. ID: [id number] Age: 74 y.o.        Sex: male Requesting Physician: Mal Misty Report Date:  02/10/2020       Interpreting Physician: Alexis Goodell History: ZAYDYN HAVEY is an 74 y.o. male with altered mental status Medications: Prednisone, Thiamine Conditions of Recording:  This is a 21 channel routine scalp EEG performed with bipolar and monopolar montages arranged in accordance to the international 10/20 system of electrode placement. One channel was dedicated to EKG recording. The patient is in the awake and uncooperative state. Description:  Artifact is prominent during the recording often obscuring the background rhythm. When able to be visualized the background is slow and poorly organized.   It consists of a low voltage, polymorphic delta rhythm that is diffusely distributed and continuous.  Some intermixed poorly organized theta activity is noted at times as well.   No epileptiform activity is noted.  Hyperventilation was not performed.  Intermittent photic stimulation was performed but failed to illicit any change in the tracing. IMPRESSION: This is a technically difficult electroencephalogram  secondary to muscle and movement artifact.  When able to be visualized though, the EEG is abnormal secondary to general background slowing.  This finding may be seen with a diffuse disturbance that is etiologically nonspecific, but may include a metabolic encephalopathy, among other possibilities.  No epileptiform activity was noted.  Alexis Goodell, MD Neurology [phone] 02/10/2020, 3:06 PM   CT ANGIO HEAD W OR WO CONTRAST  Result Date: 02/12/2020 CLINICAL DATA:  Confusion, weakness; possible cerebellar strokes on MRI EXAM: CT ANGIOGRAPHY HEAD AND NECK TECHNIQUE: Multidetector CT imaging of the head and neck was performed using the standard protocol during bolus administration of intravenous contrast. Multiplanar CT image reconstructions and MIPs were obtained to evaluate the vascular anatomy. Carotid stenosis measurements (when applicable) are obtained utilizing NASCET criteria, using the distal internal carotid diameter as the denominator. CONTRAST:  54mL OMNIPAQUE IOHEXOL 350 MG/ML SOLN COMPARISON:  Prior CT and MR imaging FINDINGS: CT HEAD FINDINGS Brain: There is no acute intracranial hemorrhage, mass effect, or edema. No new loss of gray differentiation. Cerebellar signal abnormality on MRI is not visualized by CT. Ventricles are stable in size. Patchy hypoattenuation in the supratentorial white matter likely reflects stable chronic microvascular ischemic changes. There is no extra-axial fluid collection. Vascular: No new finding. Skull: Unremarkable. Sinuses: Aerated Orbits: No new finding. Review of the MIP images confirms the above findings CTA NECK FINDINGS Aortic arch: Great vessel origins are patent. Right carotid system: Patent.  No measurable stenosis. Left carotid system: Patent. Minimal calcified plaque at the ICA origin. No measurable stenosis. Vertebral arteries: Patent and codominant. Skeleton: No acute abnormality. Foci of sclerotic changes in the right ribs suspicious for metastases as  noted previously. Other neck: No neck mass or adenopathy. Upper chest: Partially imaged right lower lobe mass. Interstitial thickening is again noted with increased patchy density. Review of the MIP images confirms the above findings CTA HEAD FINDINGS Anterior circulation: Intracranial internal carotid arteries are patent. Anterior and middle cerebral arteries are patent. Posterior circulation: Intracranial vertebral arteries, basilar artery, and posterior cerebral arteries are patent. A posterior communicating artery is identified on the left. Venous sinuses: As permitted by contrast timing, patent. Anatomic variants: Fetal  origin of the left PCA. Review of the MIP images confirms the above findings IMPRESSION: No acute intracranial abnormality. Findings on MRI are not visible on this study. Minimal plaque at the ICA origins.  No measurable stenosis. No proximal intracranial vessel occlusion or significant stenosis. Partially imaged right lower lobe mass may be increased in size. Chronic interstitial changes in the visualized upper lungs with superimposed nonspecific increased patchy density. Electronically Signed   By: Macy Mis M.D.   On: 02/12/2020 09:44   CT ANGIO NECK W OR WO CONTRAST  Result Date: 02/12/2020 CLINICAL DATA:  Confusion, weakness; possible cerebellar strokes on MRI EXAM: CT ANGIOGRAPHY HEAD AND NECK TECHNIQUE: Multidetector CT imaging of the head and neck was performed using the standard protocol during bolus administration of intravenous contrast. Multiplanar CT image reconstructions and MIPs were obtained to evaluate the vascular anatomy. Carotid stenosis measurements (when applicable) are obtained utilizing NASCET criteria, using the distal internal carotid diameter as the denominator. CONTRAST:  72mL OMNIPAQUE IOHEXOL 350 MG/ML SOLN COMPARISON:  Prior CT and MR imaging FINDINGS: CT HEAD FINDINGS Brain: There is no acute intracranial hemorrhage, mass effect, or edema. No new loss of  gray differentiation. Cerebellar signal abnormality on MRI is not visualized by CT. Ventricles are stable in size. Patchy hypoattenuation in the supratentorial white matter likely reflects stable chronic microvascular ischemic changes. There is no extra-axial fluid collection. Vascular: No new finding. Skull: Unremarkable. Sinuses: Aerated Orbits: No new finding. Review of the MIP images confirms the above findings CTA NECK FINDINGS Aortic arch: Great vessel origins are patent. Right carotid system: Patent.  No measurable stenosis. Left carotid system: Patent. Minimal calcified plaque at the ICA origin. No measurable stenosis. Vertebral arteries: Patent and codominant. Skeleton: No acute abnormality. 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 Other neck: No neck mass or adenopathy. Upper chest: Partially imaged right lower lobe mass. Interstitial thickening is again noted with increased patchy density. Review of the MIP images confirms the above findings CTA HEAD FINDINGS Anterior circulation: Intracranial internal carotid arteries are patent. Anterior and middle cerebral arteries are patent. Posterior circulation: Intracranial vertebral arteries, basilar artery, and posterior cerebral arteries are patent. A posterior communicating artery is identified on the left. Venous sinuses: As permitted by contrast timing, patent. Anatomic variants: Fetal origin of the left PCA. Review of the MIP images confirms the above findings IMPRESSION: No acute intracranial abnormality. Findings on MRI are not visible on this study. Minimal plaque at the ICA origins.  No measurable stenosis. No proximal intracranial vessel occlusion or significant stenosis. Partially imaged right lower lobe mass may be increased in size. Chronic interstitial changes in the visualized upper lungs with superimposed nonspecific increased patchy density. Electronically Signed   By: Macy Mis M.D.   On:  02/12/2020 09:44   MR BRAIN W WO CONTRAST  Result Date: 02/10/2020 CLINICAL DATA:  Small cell lung cancer EXAM: MRI HEAD WITHOUT AND WITH CONTRAST TECHNIQUE: Multiplanar, multiecho pulse sequences of the brain and surrounding structures were obtained without and with intravenous contrast. CONTRAST:  20mL GADAVIST GADOBUTROL 1 MMOL/ML IV SOLN COMPARISON:  CT head 02/09/2020.  No prior MRI for comparison. FINDINGS: Brain: Image quality degraded by motion. Postcontrast imaging degraded by significant motion. Small cerebellar lesions are present bilaterally on diffusion-weighted imaging. These cannot be confirmed on postcontrast imaging due to small size and motion. Two small 3 mm lesions left inferior cerebellum and small lesion left lateral cerebellum. Ill-defined area of  restricted diffusion right posterior cerebellum. These lesions are difficult to see on FLAIR and T2 do not show hemorrhage. Possible recent infarct versus metastatic disease. Follow-up imaging will be necessary to clarify. Mild atrophy. Mild chronic microvascular ischemic change in the white matter. No areas of hemorrhage Postcontrast imaging degraded by motion. No large enhancing lesion identified. Vascular: Normal arterial flow voids. Skull and upper cervical spine: No focal skull lesion. Sinuses/Orbits: Paranasal sinuses clear.  Bilateral cataract surgery Other: None IMPRESSION: Several small areas of restricted diffusion in the cerebellum bilaterally. Possible subacute infarct versus metastatic disease. Recommend follow-up MRI brain without with contrast when the patient is able to hold still, possibly with sedation Electronically Signed   By: Franchot Gallo M.D.   On: 02/10/2020 13:14   US Carotid Bilateral  Result Date: 02/11/2020 CLINICAL DATA:  74 year old male with stroke-like symptoms EXAM: BILATERAL CAROTID DUPLEX ULTRASOUND TECHNIQUE: Pearline Cables scale imaging, color Doppler and duplex ultrasound were performed of bilateral carotid and  vertebral arteries in the neck. COMPARISON:  None. FINDINGS: Criteria: Quantification of carotid stenosis is based on velocity parameters that correlate the residual internal carotid diameter with NASCET-based stenosis levels, using the diameter of the distal internal carotid lumen as the denominator for stenosis measurement. The following velocity measurements were obtained: RIGHT ICA: 84/27 cm/sec CCA: 01/02 cm/sec SYSTOLIC ICA/CCA RATIO:  1.3 ECA:  81 cm/sec LEFT ICA: 66/19 cm/sec CCA: 72/53 cm/sec SYSTOLIC ICA/CCA RATIO:  1.1 ECA:  87 cm/sec RIGHT CAROTID ARTERY: No significant atherosclerotic plaque or evidence of stenosis in the internal carotid artery. RIGHT VERTEBRAL ARTERY:  Patent with normal antegrade flow. LEFT CAROTID ARTERY: No significant atherosclerotic plaque or evidence of stenosis in the internal carotid artery. LEFT VERTEBRAL ARTERY:  Patent with normal antegrade flow. IMPRESSION: 1. No significant atherosclerotic plaque or evidence of stenosis in the internal carotid artery. 2. Vertebral arteries are patent with normal antegrade flow. Signed, Criselda Peaches, MD, [facility] Electronically Signed   By: Jacqulynn Cadet M.D.   On: 02/11/2020 16:28   ECHOCARDIOGRAM COMPLETE BUBBLE STUDY  Result Date: 02/11/2020    ECHOCARDIOGRAM REPORT   Patient Name:   JAROM GOVAN Date of Exam: 02/11/2020 Medical Rec #:  [id number]       Height:       66.0 in Accession #:    [id number]      Weight:       119.0 lb Date of Birth:  06/02/1946       BSA:          1.604 m Patient Age:    41 years        BP:           130/84 mmHg Patient Gender: M               HR:           96 bpm. Exam Location:  [facility] Procedure: 2D Echo, Cardiac Doppler, Color Doppler and Saline Contrast Bubble            Study Indications:     Stroke 434.91  History:         Patient has no prior history of Echocardiogram examinations.  Sonographer:     Sherrie Sport RDCS (AE)  Referring Phys:  [id number] Jennye Boroughs Diagnosing Phys: Nelva Bush MD  Sonographer Comments: Technically difficult study due to poor echo windows, no apical window and no subcostal window. IMPRESSIONS  1. Left ventricular ejection fraction, by  estimation, is 65 to 70%. The left ventricle has normal function. Left ventricular endocardial border not optimally defined to evaluate regional wall motion. Left ventricular diastolic function could not be evaluated.  2. Right ventricular systolic function is normal. The right ventricular size is normal. Tricuspid regurgitation signal is inadequate for assessing PA pressure.  3. The mitral valve is grossly normal. No evidence of mitral valve regurgitation.  4. The aortic valve was not well visualized. Aortic valve regurgitation not well-assessed. Aortic stenosis was not well-assessed. FINDINGS  Left Ventricle: Left ventricular ejection fraction, by estimation, is 65 to 70%. The left ventricle has normal function. Left ventricular endocardial border not optimally defined to evaluate regional wall motion. The left ventricular internal cavity size was normal in size. There is no left ventricular hypertrophy. Left ventricular diastolic function could not be evaluated. Right Ventricle: The right ventricular size is normal. No increase in right ventricular wall thickness. Right ventricular systolic function is normal. Tricuspid regurgitation signal is inadequate for assessing PA pressure. Left Atrium: Left atrial size was not well visualized. Right Atrium: Right atrial size was not well visualized. Pericardium: The pericardium was not well visualized. Mitral Valve: The mitral valve is grossly normal. No evidence of mitral valve regurgitation. Tricuspid Valve: The tricuspid valve is grossly normal. Tricuspid valve regurgitation is trivial. Aortic Valve: The aortic valve was not well visualized. Aortic valve regurgitation not well-assessed. Aortic stenosis was not well-assessed.  Pulmonic Valve: The pulmonic valve was not well visualized. Pulmonic valve regurgitation is not visualized. No evidence of pulmonic stenosis. Aorta: The aortic root is normal in size and structure. Pulmonary Artery: The pulmonary artery is not well seen. Venous: The inferior vena cava was not well visualized. IAS/Shunts: The interatrial septum was not well visualized. Agitated saline contrast was given intravenously to evaluate for intracardiac shunting.  LEFT VENTRICLE PLAX 2D LVIDd:         3.33 cm LVIDs:         2.22 cm LV PW:         0.95 cm LV IVS:        0.83 cm LVOT diam:     2.00 cm LVOT Area:     3.14 cm  LEFT ATRIUM         Index LA diam:    1.80 cm 1.12 cm/m                        PULMONIC VALVE AORTA                 RVOT Peak grad: 3 mmHg Ao Root diam: 3.10 cm   SHUNTS Systemic Diam: 2.00 cm Nelva Bush MD Electronically signed by Nelva Bush MD Signature Date/Time: 02/11/2020/4:44:22 PM    Final     Medications:    enoxaparin (LOVENOX) injection  1 mg/kg Subcutaneous Q12H   feeding supplement (ENSURE ENLIVE)  237 mL Oral BID BM   multivitamin with minerals  1 tablet Oral Daily   predniSONE  20 mg Oral Q breakfast   sodium chloride  1 g Oral TID WC   thiamine  100 mg Oral Daily   Continuous Infusions:   LOS: 1 day   Elesha Thedford  [facility]     02/12/2020, 12:40 PM

## 2020-02-12 NOTE — Progress Notes (Addendum)
Subjective: Still periods of confusion but more awake  Objective: Current vital signs: BP 127/74 (BP Location: Left Arm)   Pulse 97   Temp 98.3 F (36.8 C)   Resp 16   Ht 5\' 6"  (1.676 m)   Wt 54 kg   SpO2 100%   BMI 19.21 kg/m  Vital signs in last 24 hours: Temp:  [98.3 F (36.8 C)-99.4 F (37.4 C)] 98.3 F (36.8 C) (03/06 0715) Pulse Rate:  [97] 97 (03/06 0715) Resp:  [16] 16 (03/06 0715) BP: (127-135)/(74-84) 127/74 (03/06 0715) SpO2:  [99 %-100 %] 100 % (03/06 0715)  Intake/Output from previous day: 03/05 0701 - 03/06 0700 In: -  Out: 1750 [Urine:1750] Intake/Output this shift: Total I/O In: 350 [P.O.:350] Out: -  Nutritional status:  Diet Order            DIET DYS 3 Room service appropriate? Yes with Assist; Fluid consistency: Thin  Diet effective now              Neurologic Exam: Mental Status: Alert, oriented X 4.  At times does not answer questioning and just stares ahead.  Speech fluent without evidence of aphasia but has some evidence of apraxia.  Able to follow 3 step commands without difficulty. Cranial Nerves: II: Visual fields grossly normal, pupils equal, round, reactive to light and accommodation III,IV, VI: ptosis not present, extra-ocular motions intact bilaterally V,VII: smile symmetric, facial light touch sensation normal bilaterally VIII: hearing normal bilaterally IX,X: gag reflex present XI: bilateral shoulder shrug XII: midline tongue extension Motor: Able to lift all extremities against gravity and maintain with some mild generalized weakness noted  Lab Results: Basic Metabolic Panel: Recent Labs  Lab 02/09/20 1701 02/09/20 1701 02/10/20 0802 02/11/20 0911 02/11/20 2152 02/12/20 0503  NA 128*  --  129* 124* 121* 129*  K 4.7  --  3.6 3.5  --  3.7  CL 90*  --  90* 89*  --  94*  CO2 26  --  29 26  --  28  GLUCOSE 137*  --  97 107*  --  110*  BUN 17  --  11 12  --  17  CREATININE 0.90  --  0.71 0.77  --  0.77  CALCIUM 10.1    < > 9.2 8.9  --  9.0   < > = values in this interval not displayed.    Liver Function Tests: Recent Labs  Lab 02/09/20 1701  AST 27  ALT 31  ALKPHOS 63  BILITOT 0.6  PROT 7.1  ALBUMIN 3.6   No results for input(s): LIPASE, AMYLASE in the last 168 hours. No results for input(s): AMMONIA in the last 168 hours.  CBC: Recent Labs  Lab 02/09/20 1701 02/10/20 0802  WBC 10.6* 10.0  HGB 12.6* 12.8*  HCT 37.6* 37.7*  MCV 98.4 98.2  PLT 322 279    Cardiac Enzymes: No results for input(s): CKTOTAL, CKMB, CKMBINDEX, TROPONINI in the last 168 hours.  Lipid Panel: No results for input(s): CHOL, TRIG, HDL, CHOLHDL, VLDL, LDLCALC in the last 168 hours.  CBG: No results for input(s): GLUCAP in the last 168 hours.  Microbiology: Results for orders placed or performed during the hospital encounter of 12/20/19  Blood Culture (routine x 2)     Status: None   Collection Time: 12/20/19 12:59 PM   Specimen: BLOOD  Result Value Ref Range Status   Specimen Description BLOOD RIGHT [facility]  Final   Special Requests  Final    BOTTLES DRAWN AEROBIC AND ANAEROBIC Blood Culture results may not be optimal due to an excessive volume of blood received in culture bottles   Culture   Final    NO GROWTH 5 DAYS Performed at [facility], [address] [place], [place] [address]    Report Status 12/25/2019 FINAL  Final  Blood Culture (routine x 2)     Status: None   Collection Time: 12/20/19  1:00 PM   Specimen: BLOOD  Result Value Ref Range Status   Specimen Description BLOOD BLOOD RIGHT HAND  Final   Special Requests   Final    BOTTLES DRAWN AEROBIC AND ANAEROBIC Blood Culture adequate volume   Culture   Final    NO GROWTH 5 DAYS Performed at [facility], [address], [place], [place] [address]    Report Status 12/25/2019 FINAL  Final  Urine culture     Status: None   Collection Time: 12/20/19  1:00 PM   Specimen: In/Out Cath Urine  Result Value Ref Range Status    Specimen Description   Final    IN/OUT CATH URINE Performed at [facility], [address], [place], [place] [address]    Special Requests   Final    NONE Performed at [facility], [address] [place], [place] [address]    Culture   Final    NO GROWTH Performed at [facility], [place] [address], [place], [place] [address]    Report Status 12/21/2019 FINAL  Final    Coagulation Studies: Recent Labs    02/09/20 1701  LABPROT 14.2  INR 1.1    Imaging: EEG  Result Date: 02/10/2020 Alexis Goodell, MD     02/10/2020  3:11 PM ELECTROENCEPHALOGRAM REPORT Patient: Duane Santos       Room #: 136A-AA EEG No. ID: [id number] Age: 74 y.o.        Sex: male Requesting Physician: Mal Misty Report Date:  02/10/2020       Interpreting Physician: Alexis Goodell History: Duane Santos is an 74 y.o. male with altered mental status Medications: Prednisone, Thiamine Conditions of Recording:  This is a 21 channel routine scalp EEG performed with bipolar and monopolar montages arranged in accordance to the international 10/20 system of electrode placement. One channel was dedicated to EKG recording. The patient is in the awake and uncooperative state. Description:  Artifact is prominent during the recording often obscuring the background rhythm. When able to be visualized the background is slow and poorly organized.   It consists of a low voltage, polymorphic delta rhythm that is diffusely distributed and continuous.  Some intermixed poorly organized theta activity is noted at times as well.   No epileptiform activity is noted.  Hyperventilation was not performed.  Intermittent photic stimulation was performed but failed to illicit any change in the tracing. IMPRESSION: This is a technically difficult electroencephalogram secondary to muscle and movement artifact.  When able to be visualized though, the EEG is abnormal secondary to general background slowing.  This finding may be seen  with a diffuse disturbance that is etiologically nonspecific, but may include a metabolic encephalopathy, among other possibilities.  No epileptiform activity was noted.  Alexis Goodell, MD Neurology [phone] 02/10/2020, 3:06 PM   CT ANGIO HEAD W OR WO CONTRAST  Result Date: 02/12/2020 CLINICAL DATA:  Confusion, weakness; possible cerebellar strokes on MRI EXAM: CT ANGIOGRAPHY HEAD AND NECK TECHNIQUE: Multidetector CT imaging of the head  and neck was performed using the standard protocol during bolus administration of intravenous contrast. Multiplanar CT image reconstructions and MIPs were obtained to evaluate the vascular anatomy. Carotid stenosis measurements (when applicable) are obtained utilizing NASCET criteria, using the distal internal carotid diameter as the denominator. CONTRAST:  29mL OMNIPAQUE IOHEXOL 350 MG/ML SOLN COMPARISON:  Prior CT and MR imaging FINDINGS: CT HEAD FINDINGS Brain: There is no acute intracranial hemorrhage, mass effect, or edema. No new loss of gray differentiation. Cerebellar signal abnormality on MRI is not visualized by CT. Ventricles are stable in size. Patchy hypoattenuation in the supratentorial white matter likely reflects stable chronic microvascular ischemic changes. There is no extra-axial fluid collection. Vascular: No new finding. Skull: Unremarkable. Sinuses: Aerated Orbits: No new finding. Review of the MIP images confirms the above findings CTA NECK FINDINGS Aortic arch: Great vessel origins are patent. Right carotid system: Patent.  No measurable stenosis. Left carotid system: Patent. Minimal calcified plaque at the ICA origin. No measurable stenosis. Vertebral arteries: Patent and codominant. Skeleton: No acute abnormality. 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 Other neck: No neck mass or adenopathy. Upper chest: Partially imaged right lower lobe mass. Interstitial thickening is again noted with increased  patchy density. Review of the MIP images confirms the above findings CTA HEAD FINDINGS Anterior circulation: Intracranial internal carotid arteries are patent. Anterior and middle cerebral arteries are patent. Posterior circulation: Intracranial vertebral arteries, basilar artery, and posterior cerebral arteries are patent. A posterior communicating artery is identified on the left. Venous sinuses: As permitted by contrast timing, patent. Anatomic variants: Fetal origin of the left PCA. Review of the MIP images confirms the above findings IMPRESSION: No acute intracranial abnormality. Findings on MRI are not visible on this study. Minimal plaque at the ICA origins.  No measurable stenosis. No proximal intracranial vessel occlusion or significant stenosis. Partially imaged right lower lobe mass may be increased in size. Chronic interstitial changes in the visualized upper lungs with superimposed nonspecific increased patchy density. Electronically Signed   By: Macy Mis M.D.   On: 02/12/2020 09:44   CT ANGIO NECK W OR WO CONTRAST  Result Date: 02/12/2020 CLINICAL DATA:  Confusion, weakness; possible cerebellar strokes on MRI EXAM: CT ANGIOGRAPHY HEAD AND NECK TECHNIQUE: Multidetector CT imaging of the head and neck was performed using the standard protocol during bolus administration of intravenous contrast. Multiplanar CT image reconstructions and MIPs were obtained to evaluate the vascular anatomy. Carotid stenosis measurements (when applicable) are obtained utilizing NASCET criteria, using the distal internal carotid diameter as the denominator. CONTRAST:  67mL OMNIPAQUE IOHEXOL 350 MG/ML SOLN COMPARISON:  Prior CT and MR imaging FINDINGS: CT HEAD FINDINGS Brain: There is no acute intracranial hemorrhage, mass effect, or edema. No new loss of gray differentiation. Cerebellar signal abnormality on MRI is not visualized by CT. Ventricles are stable in size. Patchy hypoattenuation in the supratentorial white  matter likely reflects stable chronic microvascular ischemic changes. There is no extra-axial fluid collection. Vascular: No new finding. Skull: Unremarkable. Sinuses: Aerated Orbits: No new finding. Review of the MIP images confirms the above findings CTA NECK FINDINGS Aortic arch: Great vessel origins are patent. Right carotid system: Patent.  No measurable stenosis. Left carotid system: Patent. Minimal calcified plaque at the ICA origin. No measurable stenosis. Vertebral arteries: Patent and codominant. Skeleton: No acute abnormality. 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 Other neck: No neck mass or adenopathy.  Upper chest: Partially imaged right lower lobe mass. Interstitial thickening is again noted with increased patchy density. Review of the MIP images confirms the above findings CTA HEAD FINDINGS Anterior circulation: Intracranial internal carotid arteries are patent. Anterior and middle cerebral arteries are patent. Posterior circulation: Intracranial vertebral arteries, basilar artery, and posterior cerebral arteries are patent. A posterior communicating artery is identified on the left. Venous sinuses: As permitted by contrast timing, patent. Anatomic variants: Fetal origin of the left PCA. Review of the MIP images confirms the above findings IMPRESSION: No acute intracranial abnormality. Findings on MRI are not visible on this study. Minimal plaque at the ICA origins.  No measurable stenosis. No proximal intracranial vessel occlusion or significant stenosis. Partially imaged right lower lobe mass may be increased in size. Chronic interstitial changes in the visualized upper lungs with superimposed nonspecific increased patchy density. Electronically Signed   By: Macy Mis M.D.   On: 02/12/2020 09:44   MR BRAIN W WO CONTRAST  Result Date: 02/10/2020 CLINICAL DATA:  Small cell lung cancer EXAM: MRI HEAD WITHOUT AND WITH CONTRAST TECHNIQUE:  Multiplanar, multiecho pulse sequences of the brain and surrounding structures were obtained without and with intravenous contrast. CONTRAST:  44mL GADAVIST GADOBUTROL 1 MMOL/ML IV SOLN COMPARISON:  CT head 02/09/2020.  No prior MRI for comparison. FINDINGS: Brain: Image quality degraded by motion. Postcontrast imaging degraded by significant motion. Small cerebellar lesions are present bilaterally on diffusion-weighted imaging. These cannot be confirmed on postcontrast imaging due to small size and motion. Two small 3 mm lesions left inferior cerebellum and small lesion left lateral cerebellum. Ill-defined area of restricted diffusion right posterior cerebellum. These lesions are difficult to see on FLAIR and T2 do not show hemorrhage. Possible recent infarct versus metastatic disease. Follow-up imaging will be necessary to clarify. Mild atrophy. Mild chronic microvascular ischemic change in the white matter. No areas of hemorrhage Postcontrast imaging degraded by motion. No large enhancing lesion identified. Vascular: Normal arterial flow voids. Skull and upper cervical spine: No focal skull lesion. Sinuses/Orbits: Paranasal sinuses clear.  Bilateral cataract surgery Other: None IMPRESSION: Several small areas of restricted diffusion in the cerebellum bilaterally. Possible subacute infarct versus metastatic disease. Recommend follow-up MRI brain without with contrast when the patient is able to hold still, possibly with sedation Electronically Signed   By: Franchot Gallo M.D.   On: 02/10/2020 13:14   US Carotid Bilateral  Result Date: 02/11/2020 CLINICAL DATA:  74 year old male with stroke-like symptoms EXAM: BILATERAL CAROTID DUPLEX ULTRASOUND TECHNIQUE: Pearline Cables scale imaging, color Doppler and duplex ultrasound were performed of bilateral carotid and vertebral arteries in the neck. COMPARISON:  None. FINDINGS: Criteria: Quantification of carotid stenosis is based on velocity parameters that correlate the residual  internal carotid diameter with NASCET-based stenosis levels, using the diameter of the distal internal carotid lumen as the denominator for stenosis measurement. The following velocity measurements were obtained: RIGHT ICA: 84/27 cm/sec CCA: 92/33 cm/sec SYSTOLIC ICA/CCA RATIO:  1.3 ECA:  81 cm/sec LEFT ICA: 66/19 cm/sec CCA: 00/76 cm/sec SYSTOLIC ICA/CCA RATIO:  1.1 ECA:  87 cm/sec RIGHT CAROTID ARTERY: No significant atherosclerotic plaque or evidence of stenosis in the internal carotid artery. RIGHT VERTEBRAL ARTERY:  Patent with normal antegrade flow. LEFT CAROTID ARTERY: No significant atherosclerotic plaque or evidence of stenosis in the internal carotid artery. LEFT VERTEBRAL ARTERY:  Patent with normal antegrade flow. IMPRESSION: 1. No significant atherosclerotic plaque or evidence of stenosis in the internal carotid artery. 2. Vertebral arteries are patent with  normal antegrade flow. Signed, Criselda Peaches, MD, [facility] Electronically Signed   By: Jacqulynn Cadet M.D.   On: 02/11/2020 16:28   ECHOCARDIOGRAM COMPLETE BUBBLE STUDY  Result Date: 02/11/2020    ECHOCARDIOGRAM REPORT   Patient Name:   Duane Santos Date of Exam: 02/11/2020 Medical Rec #:  [id number]       Height:       66.0 in Accession #:    [id number]      Weight:       119.0 lb Date of Birth:  [birth year]       BSA:          1.604 m Patient Age:    21 years        BP:           130/84 mmHg Patient Gender: M               HR:           96 bpm. Exam Location:  [facility] Procedure: 2D Echo, Cardiac Doppler, Color Doppler and Saline Contrast Bubble            Study Indications:     Stroke 434.91  History:         Patient has no prior history of Echocardiogram examinations.  Sonographer:     Sherrie Sport RDCS (AE) Referring Phys:  [id number] Jennye Boroughs Diagnosing Phys: Nelva Bush MD  Sonographer Comments: Technically difficult study due to poor echo windows, no apical window  and no subcostal window. IMPRESSIONS  1. Left ventricular ejection fraction, by estimation, is 65 to 70%. The left ventricle has normal function. Left ventricular endocardial border not optimally defined to evaluate regional wall motion. Left ventricular diastolic function could not be evaluated.  2. Right ventricular systolic function is normal. The right ventricular size is normal. Tricuspid regurgitation signal is inadequate for assessing PA pressure.  3. The mitral valve is grossly normal. No evidence of mitral valve regurgitation.  4. The aortic valve was not well visualized. Aortic valve regurgitation not well-assessed. Aortic stenosis was not well-assessed. FINDINGS  Left Ventricle: Left ventricular ejection fraction, by estimation, is 65 to 70%. The left ventricle has normal function. Left ventricular endocardial border not optimally defined to evaluate regional wall motion. The left ventricular internal cavity size was normal in size. There is no left ventricular hypertrophy. Left ventricular diastolic function could not be evaluated. Right Ventricle: The right ventricular size is normal. No increase in right ventricular wall thickness. Right ventricular systolic function is normal. Tricuspid regurgitation signal is inadequate for assessing PA pressure. Left Atrium: Left atrial size was not well visualized. Right Atrium: Right atrial size was not well visualized. Pericardium: The pericardium was not well visualized. Mitral Valve: The mitral valve is grossly normal. No evidence of mitral valve regurgitation. Tricuspid Valve: The tricuspid valve is grossly normal. Tricuspid valve regurgitation is trivial. Aortic Valve: The aortic valve was not well visualized. Aortic valve regurgitation not well-assessed. Aortic stenosis was not well-assessed. Pulmonic Valve: The pulmonic valve was not well visualized. Pulmonic valve regurgitation is not visualized. No evidence of pulmonic stenosis. Aorta: The aortic root is  normal in size and structure. Pulmonary Artery: The pulmonary artery is not well seen. Venous: The inferior vena cava was not well visualized. IAS/Shunts: The interatrial septum was not well visualized. Agitated saline contrast was given intravenously to evaluate for intracardiac shunting.  LEFT VENTRICLE PLAX 2D LVIDd:  3.33 cm LVIDs:         2.22 cm LV PW:         0.95 cm LV IVS:        0.83 cm LVOT diam:     2.00 cm LVOT Area:     3.14 cm  LEFT ATRIUM         Index LA diam:    1.80 cm 1.12 cm/m                        PULMONIC VALVE AORTA                 RVOT Peak grad: 3 mmHg Ao Root diam: 3.10 cm   SHUNTS Systemic Diam: 2.00 cm Nelva Bush MD Electronically signed by Nelva Bush MD Signature Date/Time: 02/11/2020/4:44:22 PM    Final     Medications:  I have reviewed the patient's current medications. Scheduled: . enoxaparin (LOVENOX) injection  1 mg/kg Subcutaneous Q12H  . feeding supplement (ENSURE ENLIVE)  237 mL Oral BID BM  . multivitamin with minerals  1 tablet Oral Daily  . predniSONE  20 mg Oral Q breakfast  . sodium chloride  1 g Oral TID WC  . 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 Patient hyponatremic. MRI of the brain personally reviewed and shows several small areas of restricted diffusion in the cerebellum bilaterally.  Likely acute/subacute infarcts but can not rule out metastasis.  Results discussed with hospitalist and patient restarted on anticoagulation.   Presenting symptoms likely multifactorial in  etiology and related to recent COVID infection, hyponatremia and malignancy.    - Wife not present at bedside - MRI reviewed and I am not convinced there is  Need for another one as it will not change acute management. Prior MRI with small strokes likely embolic in setting of hypercoagulable state from covid/cancer. There is motion artifact despite that the strokes are visible - MRI can be done as out patient and doesn't have to be done in a week.  - pt/ot - d/c planning    Addendum - Cased d/w wife over the phone.  We agreed that symptoms are likely delirium in nature If possible change neurochecks at night to q6 rather then q4 so he can sleep at night Don't think another MRI will change prognostication/management Strokes in the crebellum not likely contributing to his mentation Hopefully sleep tonight and once mentation improves d/c planning

## 2020-02-12 NOTE — Progress Notes (Signed)
Patient had a fever with temperature 103 F this afternoon.  Blood cultures and chest x-ray were obtained.  Chest x-ray showed multifocal pneumonia.  Patient will be started on IV vancomycin and cefepime for treatment of pneumonia.  Pharmacy has been consulted to assist with antimicrobial management.

## 2020-02-12 NOTE — Progress Notes (Signed)
Physical Therapy Treatment Patient Details Name: Duane Santos MRN: [id number] DOB: [birth year] Today's Date: 02/12/2020    History of Present Illness presented to ER secondary to AMS, progressive weakness and repeated falls; admitted for management of acute metabolic encephalopathy.  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 PMH significant for MG, covid-19 infection (1/21) and newly-diagnosed R lung cancer.    PT Comments    Patient tolerated treatment fair but was limited by fever of 103 degrees and HR that went up to 122-130 bpm in sitting and 148 bpm in standing, so session was discontinued and pt assisted to bed. Patient unable to safely transfer to chair or continue mobility training second to fever and heart rate. Respiratory rate elevated throughout session and pt hot to touch. SpO2 remained WFL on 2L/min O2 throughout session. Nurse tech took routine vitals at start of session and notified nursing of new fever. Nursing administered tylenol and was attending to pt at end of PT session. Patient continued to require mod assist for bed mobility but was able to come to stand with min A at RW. Continues to demo confusion and requires heavy step by step cuing for mobility. Unable to sit at edge of bed or stand without external support. Leans backwards and to the left. +2 assist for line management and to scoot patient up in bed. Patient would benefit from continued skilled physical therapy to address remaining impairments and functional limitations to work towards stated goals and return to PLOF or maximal functional independence.     Follow Up Recommendations  SNF     Equipment Recommendations  Rolling walker with 5" wheels;3in1 (PT)    Recommendations for Other Services       Precautions / Restrictions Precautions Precautions: Fall Restrictions Weight Bearing Restrictions: No    Mobility  Bed Mobility Overal bed mobility: Needs  Assistance Bed Mobility: Supine to Sit;Sit to Supine     Supine to sit: Mod assist Sit to supine: Mod assist;+2 for safety/equipment   General bed mobility comments: Posterior and left trunk lean, overally poor trunk/head control. Inability to find equilibrium. HR 122 bpm in sitting.  Transfers Overall transfer level: Needs assistance Equipment used: Rolling walker (2 wheeled) Transfers: Sit to/from Stand Sit to Stand: +2 safety/equipment;Min assist         General transfer comment: required min A to safely stand and sit as well as step by step cuing for initiation and completion of task. HR jumped to 148 bpm so discontinued and returned to bed.  Ambulation/Gait             General Gait Details: unable to safely attempt ambulation today. Returned to bed due to elevated heart rate.   Stairs             Wheelchair Mobility    Modified Rankin (Stroke Patients Only)       Balance Overall balance assessment: Needs assistance Sitting-balance support: No upper extremity supported;Feet supported Sitting balance-Leahy Scale: Poor Sitting balance - Comments: Heavy posterior/Left lean.  Unable to use hands appropriately for support. Postural control: Posterior lean;Left lateral lean Standing balance support: Bilateral upper extremity supported Standing balance-Leahy Scale: Poor Standing balance comment: Dependent on PT and RW for support in standing.                            Cognition Arousal/Alertness: Awake/alert Behavior During Therapy: Impulsive;Flat affect(Patient with difficulty maintaining attention  to task. Responds to communication and is able to follow some commands.) Overall Cognitive Status: No family/caregiver present to determine baseline cognitive functioning                                 General Comments: Oriented to self.  Able to follow one step commands with use of verbal/visual/tactile cues and requires frequent  redirection ti maintain attention to task.  Difficulty with initiation and ending of tasks. Repeatedly asks to urinate, but able to understand he has urinary catheter when bag held up for him to see.      Exercises Other Exercises Other Exercises: Edge of bed sitting for 10 minutes with attempts to improve trunk control. Patient unable to maintain balance without support. Helped pt reposition in bed. Scooted up bed with max A +2 (patient intermittantly able to follow instructions to help). Other Exercises: educated pt on call button.    General Comments General comments (skin integrity, edema, etc.): Patient very hot to touch. Nursing aware he has fever of 103.      Pertinent Vitals/Pain      Home Living                      Prior Function            PT Goals (current goals can now be found in the care plan section) Acute Rehab PT Goals Patient Stated Goal: Unable to state a goal.  Agreeable to therapy session.    Frequency    Min 2X/week      PT Plan Current plan remains appropriate    Co-evaluation              AM-PAC PT "6 Clicks" Mobility   Outcome Measure  Help needed turning from your back to your side while in a flat bed without using bedrails?: A Lot Help needed moving from lying on your back to sitting on the side of a flat bed without using bedrails?: A Lot Help needed moving to and from a bed to a chair (including a wheelchair)?: A Lot Help needed standing up from a chair using your arms (e.g., wheelchair or bedside chair)?: A Lot Help needed to walk in hospital room?: A Lot Help needed climbing 3-5 steps with a railing? : Total 6 Click Score: 11    End of Session Equipment Utilized During Treatment: Gait belt Activity Tolerance: Patient tolerated treatment well;Treatment limited secondary to medical complications (Comment)(Elevated heart rate to 148 bpm with standing; fever) Patient left: with call bell/phone within reach;in bed;with bed  alarm set;with nursing/sitter in room(OT at bedside to complete evaluation) Nurse Communication: Mobility status PT Visit Diagnosis: Muscle weakness (generalized) (M62.81);Other abnormalities of gait and mobility (R26.89);Unsteadiness on feet (R26.81);Other symptoms and signs involving the nervous system (R29.898);Repeated falls (R29.6)     Time: 1511-1540 PT Time Calculation (min) (ACUTE ONLY): 29 min  Charges:  $Therapeutic Activity: 23-37 mins                     Everlean Alstrom. Graylon Good, PT, DPT 02/12/20, 4:01 PM

## 2020-02-12 NOTE — Progress Notes (Signed)
Pharmacy Antibiotic Note  Duane Santos is a 74 y.o. male admitted on 02/09/2020 with pneumonia.  Pharmacy has been consulted for Vancomycin, Cefepime  dosing.  Plan: Cefepime 2 gm IV Q8H ordered to start on 3/6 @ 2100.  Vancomycin 1250 mg IV Q24H ordered to start on 3/6 @ 2100 No peak or trough currently ordered.   Vd = 38.9 L  Ke = 0.067 hr-1 T1/2 = 10.4 hrs AUC = 480.5  Vanc trough = 8.9 mcg/mL   Height: 5\' 6"  (167.6 cm) Weight: 119 lb 0.8 oz (54 kg) IBW/kg (Calculated) : 63.8  Temp (24hrs), Avg:99.5 F (37.5 C), Min:97.9 F (36.6 C), Max:103 F (39.4 C)  Recent Labs  Lab 02/09/20 1701 02/10/20 0802 02/11/20 0911 02/12/20 0503  WBC 10.6* 10.0  --   --   CREATININE 0.90 0.71 0.77 0.77    Estimated Creatinine Clearance: 63.8 mL/min (by C-G formula based on SCr of 0.77 mg/dL).    No Known Allergies  Antimicrobials this admission:   >>    >>   Dose adjustments this admission:   Microbiology results:  BCx:   UCx:    Sputum:    MRSA PCR:   Thank you for allowing pharmacy to be a part of this patient's care.  Rory Montel D 02/12/2020 8:46 PM

## 2020-02-12 NOTE — Plan of Care (Signed)
  Problem: Education: Goal: Knowledge of General Education information will improve Description: Including pain rating scale, medication(s)/side effects and non-pharmacologic comfort measures Outcome: Progressing   Problem: Clinical Measurements: Goal: Ability to maintain clinical measurements within normal limits will improve Outcome: Progressing Goal: Will remain free from infection Outcome: Progressing Goal: Diagnostic test results will improve Outcome: Progressing Goal: Respiratory complications will improve Outcome: Progressing Goal: Cardiovascular complication will be avoided Outcome: Progressing   Problem: Activity: Goal: Risk for activity intolerance will decrease Outcome: Progressing   Problem: Coping: Goal: Level of anxiety will decrease Outcome: Progressing   Problem: Pain Managment: Goal: General experience of comfort will improve Outcome: Progressing   

## 2020-02-12 NOTE — Progress Notes (Signed)
[facility]  ROUNDING NOTE   Subjective:  Serum sodium improved significantly and currently 129. Patient awake, alert, conversant.   Objective:  Vital signs in last 24 hours:  Temp:  [98.3 F (36.8 C)-99.4 F (37.4 C)] 98.3 F (36.8 C) (03/06 0715) Pulse Rate:  [97] 97 (03/06 0715) Resp:  [16] 16 (03/06 0715) BP: (127-135)/(74-84) 127/74 (03/06 0715) SpO2:  [99 %-100 %] 100 % (03/06 0715)  Weight change:  Filed Weights   02/09/20 1656  Weight: 54 kg    Intake/Output: I/O last 3 completed shifts: In: -  Out: 2700 [Urine:2700]   Intake/Output this shift:  Total I/O In: 350 [P.O.:350] Out: -   Physical Exam: General: No acute distress  Head: Normocephalic, atraumatic. Moist oral mucosal membranes  Eyes: Anicteric  Neck: Supple, trachea midline  Lungs:  Clear to auscultation, normal effort  Heart: S1S2 no rubs  Abdomen:  Soft, nontender, bowel sounds present  Extremities: No peripheral edema.  Neurologic: Awake, alert, following commands  Skin: No lesions       Basic Metabolic Panel: Recent Labs  Lab 02/09/20 1701 02/09/20 1701 02/10/20 0802 02/11/20 0911 02/11/20 2152 02/12/20 0503  NA 128*  --  129* 124* 121* 129*  K 4.7  --  3.6 3.5  --  3.7  CL 90*  --  90* 89*  --  94*  CO2 26  --  29 26  --  28  GLUCOSE 137*  --  97 107*  --  110*  BUN 17  --  11 12  --  17  CREATININE 0.90  --  0.71 0.77  --  0.77  CALCIUM 10.1   < > 9.2 8.9  --  9.0   < > = values in this interval not displayed.    Liver Function Tests: Recent Labs  Lab 02/09/20 1701  AST 27  ALT 31  ALKPHOS 63  BILITOT 0.6  PROT 7.1  ALBUMIN 3.6   No results for input(s): LIPASE, AMYLASE in the last 168 hours. No results for input(s): AMMONIA in the last 168 hours.  CBC: Recent Labs  Lab 02/09/20 1701 02/10/20 0802  WBC 10.6* 10.0  HGB 12.6* 12.8*  HCT 37.6* 37.7*  MCV 98.4 98.2  PLT 322 279    Cardiac Enzymes: No results for input(s): CKTOTAL, CKMB,  CKMBINDEX, TROPONINI in the last 168 hours.  BNP: Invalid input(s): POCBNP  CBG: No results for input(s): GLUCAP in the last 168 hours.  Microbiology: Results for orders placed or performed during the hospital encounter of 12/20/19  Blood Culture (routine x 2)     Status: None   Collection Time: 12/20/19 12:59 PM   Specimen: BLOOD  Result Value Ref Range Status   Specimen Description BLOOD RIGHT [facility]  Final   Special Requests   Final    BOTTLES DRAWN AEROBIC AND ANAEROBIC Blood Culture results may not be optimal due to an excessive volume of blood received in culture bottles   Culture   Final    NO GROWTH 5 DAYS Performed at [facility], [address], [place], [place] [address]    Report Status 12/25/2019 FINAL  Final  Blood Culture (routine x 2)     Status: None   Collection Time: 12/20/19  1:00 PM   Specimen: BLOOD  Result Value Ref Range Status   Specimen Description BLOOD BLOOD RIGHT HAND  Final   Special Requests   Final    BOTTLES DRAWN AEROBIC AND ANAEROBIC  Blood Culture adequate volume   Culture   Final    NO GROWTH 5 DAYS Performed at [facility], [address] [place], [place] [address]    Report Status 12/25/2019 FINAL  Final  Urine culture     Status: None   Collection Time: 12/20/19  1:00 PM   Specimen: In/Out Cath Urine  Result Value Ref Range Status   Specimen Description   Final    IN/OUT CATH URINE Performed at [facility], [address] [place], [place] [address]    Special Requests   Final    NONE Performed at [facility], [address] [place], [place] [address]    Culture   Final    NO GROWTH Performed at [facility], [place] [address], [place], [place] [address]    Report Status 12/21/2019 FINAL  Final    Coagulation Studies: Recent Labs    02/09/20 1701  LABPROT 14.2  INR 1.1    Urinalysis: Recent Labs    02/09/20 1707  COLORURINE YELLOW*  LABSPEC 1.010  PHURINE 8.0   GLUCOSEU NEGATIVE  HGBUR SMALL*  BILIRUBINUR NEGATIVE  KETONESUR NEGATIVE  PROTEINUR NEGATIVE  NITRITE NEGATIVE  LEUKOCYTESUR NEGATIVE      Imaging: EEG  Result Date: 02/10/2020 Alexis Goodell, MD     02/10/2020  3:11 PM ELECTROENCEPHALOGRAM REPORT Patient: [name]       Room #: 136A-AA EEG No. ID: [id number] Age: 74 y.o.        Sex: male Requesting Physician: Mal Misty Report Date:  02/10/2020       Interpreting Physician: Alexis Goodell History: AHRON HULBERT is an 74 y.o. male with altered mental status Medications: Prednisone, Thiamine Conditions of Recording:  This is a 21 channel routine scalp EEG performed with bipolar and monopolar montages arranged in accordance to the international 10/20 system of electrode placement. One channel was dedicated to EKG recording. The patient is in the awake and uncooperative state. Description:  Artifact is prominent during the recording often obscuring the background rhythm. When able to be visualized the background is slow and poorly organized.   It consists of a low voltage, polymorphic delta rhythm that is diffusely distributed and continuous.  Some intermixed poorly organized theta activity is noted at times as well.   No epileptiform activity is noted.  Hyperventilation was not performed.  Intermittent photic stimulation was performed but failed to illicit any change in the tracing. IMPRESSION: This is a technically difficult electroencephalogram secondary to muscle and movement artifact.  When able to be visualized though, the EEG is abnormal secondary to general background slowing.  This finding may be seen with a diffuse disturbance that is etiologically nonspecific, but may include a metabolic encephalopathy, among other possibilities.  No epileptiform activity was noted.  Alexis Goodell, MD Neurology [phone] 02/10/2020, 3:06 PM   CT ANGIO HEAD W OR WO CONTRAST  Result Date: 02/12/2020 CLINICAL DATA:  Confusion, weakness; possible cerebellar  strokes on MRI EXAM: CT ANGIOGRAPHY HEAD AND NECK TECHNIQUE: Multidetector CT imaging of the head and neck was performed using the standard protocol during bolus administration of intravenous contrast. Multiplanar CT image reconstructions and MIPs were obtained to evaluate the vascular anatomy. Carotid stenosis measurements (when applicable) are obtained utilizing NASCET criteria, using the distal internal carotid diameter as the denominator. CONTRAST:  79mL OMNIPAQUE IOHEXOL 350 MG/ML SOLN COMPARISON:  Prior CT and MR imaging FINDINGS: CT HEAD FINDINGS Brain: There is no acute intracranial hemorrhage, mass  effect, or edema. No new loss of gray differentiation. Cerebellar signal abnormality on MRI is not visualized by CT. Ventricles are stable in size. Patchy hypoattenuation in the supratentorial white matter likely reflects stable chronic microvascular ischemic changes. There is no extra-axial fluid collection. Vascular: No new finding. Skull: Unremarkable. Sinuses: Aerated Orbits: No new finding. Review of the MIP images confirms the above findings CTA NECK FINDINGS Aortic arch: Great vessel origins are patent. Right carotid system: Patent.  No measurable stenosis. Left carotid system: Patent. Minimal calcified plaque at the ICA origin. No measurable stenosis. Vertebral arteries: Patent and codominant. Skeleton: No acute abnormality. 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 Other neck: No neck mass or adenopathy. Upper chest: Partially imaged right lower lobe mass. Interstitial thickening is again noted with increased patchy density. Review of the MIP images confirms the above findings CTA HEAD FINDINGS Anterior circulation: Intracranial internal carotid arteries are patent. Anterior and middle cerebral arteries are patent. Posterior circulation: Intracranial vertebral arteries, basilar artery, and posterior cerebral arteries are patent. A posterior communicating  artery is identified on the left. Venous sinuses: As permitted by contrast timing, patent. Anatomic variants: Fetal origin of the left PCA. Review of the MIP images confirms the above findings IMPRESSION: No acute intracranial abnormality. Findings on MRI are not visible on this study. Minimal plaque at the ICA origins.  No measurable stenosis. No proximal intracranial vessel occlusion or significant stenosis. Partially imaged right lower lobe mass may be increased in size. Chronic interstitial changes in the visualized upper lungs with superimposed nonspecific increased patchy density. Electronically Signed   By: Macy Mis M.D.   On: 02/12/2020 09:44   CT ANGIO NECK W OR WO CONTRAST  Result Date: 02/12/2020 CLINICAL DATA:  Confusion, weakness; possible cerebellar strokes on MRI EXAM: CT ANGIOGRAPHY HEAD AND NECK TECHNIQUE: Multidetector CT imaging of the head and neck was performed using the standard protocol during bolus administration of intravenous contrast. Multiplanar CT image reconstructions and MIPs were obtained to evaluate the vascular anatomy. Carotid stenosis measurements (when applicable) are obtained utilizing NASCET criteria, using the distal internal carotid diameter as the denominator. CONTRAST:  71mL OMNIPAQUE IOHEXOL 350 MG/ML SOLN COMPARISON:  Prior CT and MR imaging FINDINGS: CT HEAD FINDINGS Brain: There is no acute intracranial hemorrhage, mass effect, or edema. No new loss of gray differentiation. Cerebellar signal abnormality on MRI is not visualized by CT. Ventricles are stable in size. Patchy hypoattenuation in the supratentorial white matter likely reflects stable chronic microvascular ischemic changes. There is no extra-axial fluid collection. Vascular: No new finding. Skull: Unremarkable. Sinuses: Aerated Orbits: No new finding. Review of the MIP images confirms the above findings CTA NECK FINDINGS Aortic arch: Great vessel origins are patent. Right carotid system: Patent.  No  measurable stenosis. Left carotid system: Patent. Minimal calcified plaque at the ICA origin. No measurable stenosis. Vertebral arteries: Patent and codominant. Skeleton: No acute abnormality. 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 Other neck: No neck mass or adenopathy. Upper chest: Partially imaged right lower lobe mass. Interstitial thickening is again noted with increased patchy density. Review of the MIP images confirms the above findings CTA HEAD FINDINGS Anterior circulation: Intracranial internal carotid arteries are patent. Anterior and middle cerebral arteries are patent. Posterior circulation: Intracranial vertebral arteries, basilar artery, and posterior cerebral arteries are patent. A posterior communicating artery is identified on the left. Venous sinuses: As permitted by contrast timing,  patent. Anatomic variants: Fetal origin of the left PCA. Review of the MIP images confirms the above findings IMPRESSION: No acute intracranial abnormality. Findings on MRI are not visible on this study. Minimal plaque at the ICA origins.  No measurable stenosis. No proximal intracranial vessel occlusion or significant stenosis. Partially imaged right lower lobe mass may be increased in size. Chronic interstitial changes in the visualized upper lungs with superimposed nonspecific increased patchy density. Electronically Signed   By: Macy Mis M.D.   On: 02/12/2020 09:44   MR BRAIN W WO CONTRAST  Result Date: 02/10/2020 CLINICAL DATA:  Small cell lung cancer EXAM: MRI HEAD WITHOUT AND WITH CONTRAST TECHNIQUE: Multiplanar, multiecho pulse sequences of the brain and surrounding structures were obtained without and with intravenous contrast. CONTRAST:  40mL GADAVIST GADOBUTROL 1 MMOL/ML IV SOLN COMPARISON:  CT head 02/09/2020.  No prior MRI for comparison. FINDINGS: Brain: Image quality degraded by motion. Postcontrast imaging degraded by significant motion. Small  cerebellar lesions are present bilaterally on diffusion-weighted imaging. These cannot be confirmed on postcontrast imaging due to small size and motion. Two small 3 mm lesions left inferior cerebellum and small lesion left lateral cerebellum. Ill-defined area of restricted diffusion right posterior cerebellum. These lesions are difficult to see on FLAIR and T2 do not show hemorrhage. Possible recent infarct versus metastatic disease. Follow-up imaging will be necessary to clarify. Mild atrophy. Mild chronic microvascular ischemic change in the white matter. No areas of hemorrhage Postcontrast imaging degraded by motion. No large enhancing lesion identified. Vascular: Normal arterial flow voids. Skull and upper cervical spine: No focal skull lesion. Sinuses/Orbits: Paranasal sinuses clear.  Bilateral cataract surgery Other: None IMPRESSION: Several small areas of restricted diffusion in the cerebellum bilaterally. Possible subacute infarct versus metastatic disease. Recommend follow-up MRI brain without with contrast when the patient is able to hold still, possibly with sedation Electronically Signed   By: Franchot Gallo M.D.   On: 02/10/2020 13:14   US Carotid Bilateral  Result Date: 02/11/2020 CLINICAL DATA:  74 year old male with stroke-like symptoms EXAM: BILATERAL CAROTID DUPLEX ULTRASOUND TECHNIQUE: Pearline Cables scale imaging, color Doppler and duplex ultrasound were performed of bilateral carotid and vertebral arteries in the neck. COMPARISON:  None. FINDINGS: Criteria: Quantification of carotid stenosis is based on velocity parameters that correlate the residual internal carotid diameter with NASCET-based stenosis levels, using the diameter of the distal internal carotid lumen as the denominator for stenosis measurement. The following velocity measurements were obtained: RIGHT ICA: 84/27 cm/sec CCA: 62/03 cm/sec SYSTOLIC ICA/CCA RATIO:  1.3 ECA:  81 cm/sec LEFT ICA: 66/19 cm/sec CCA: 55/97 cm/sec SYSTOLIC ICA/CCA  RATIO:  1.1 ECA:  87 cm/sec RIGHT CAROTID ARTERY: No significant atherosclerotic plaque or evidence of stenosis in the internal carotid artery. RIGHT VERTEBRAL ARTERY:  Patent with normal antegrade flow. LEFT CAROTID ARTERY: No significant atherosclerotic plaque or evidence of stenosis in the internal carotid artery. LEFT VERTEBRAL ARTERY:  Patent with normal antegrade flow. IMPRESSION: 1. No significant atherosclerotic plaque or evidence of stenosis in the internal carotid artery. 2. Vertebral arteries are patent with normal antegrade flow. Signed, Criselda Peaches, MD, [facility] Electronically Signed   By: Jacqulynn Cadet M.D.   On: 02/11/2020 16:28   ECHOCARDIOGRAM COMPLETE BUBBLE STUDY  Result Date: 02/11/2020    ECHOCARDIOGRAM REPORT   Patient Name:   AREK SPADAFORE Date of Exam: 02/11/2020 Medical Rec #:  [id number]       Height:  66.0 in Accession #:    [id number]      Weight:       119.0 lb Date of Birth:  August 13, 1946       BSA:          1.604 m Patient Age:    21 years        BP:           130/84 mmHg Patient Gender: M               HR:           96 bpm. Exam Location:  [facility] Procedure: 2D Echo, Cardiac Doppler, Color Doppler and Saline Contrast Bubble            Study Indications:     Stroke 434.91  History:         Patient has no prior history of Echocardiogram examinations.  Sonographer:     Sherrie Sport RDCS (AE) Referring Phys:  [id number] Jennye Boroughs Diagnosing Phys: Nelva Bush MD  Sonographer Comments: Technically difficult study due to poor echo windows, no apical window and no subcostal window. IMPRESSIONS  1. Left ventricular ejection fraction, by estimation, is 65 to 70%. The left ventricle has normal function. Left ventricular endocardial border not optimally defined to evaluate regional wall motion. Left ventricular diastolic function could not be evaluated.  2. Right ventricular systolic function is normal. The right  ventricular size is normal. Tricuspid regurgitation signal is inadequate for assessing PA pressure.  3. The mitral valve is grossly normal. No evidence of mitral valve regurgitation.  4. The aortic valve was not well visualized. Aortic valve regurgitation not well-assessed. Aortic stenosis was not well-assessed. FINDINGS  Left Ventricle: Left ventricular ejection fraction, by estimation, is 65 to 70%. The left ventricle has normal function. Left ventricular endocardial border not optimally defined to evaluate regional wall motion. The left ventricular internal cavity size was normal in size. There is no left ventricular hypertrophy. Left ventricular diastolic function could not be evaluated. Right Ventricle: The right ventricular size is normal. No increase in right ventricular wall thickness. Right ventricular systolic function is normal. Tricuspid regurgitation signal is inadequate for assessing PA pressure. Left Atrium: Left atrial size was not well visualized. Right Atrium: Right atrial size was not well visualized. Pericardium: The pericardium was not well visualized. Mitral Valve: The mitral valve is grossly normal. No evidence of mitral valve regurgitation. Tricuspid Valve: The tricuspid valve is grossly normal. Tricuspid valve regurgitation is trivial. Aortic Valve: The aortic valve was not well visualized. Aortic valve regurgitation not well-assessed. Aortic stenosis was not well-assessed. Pulmonic Valve: The pulmonic valve was not well visualized. Pulmonic valve regurgitation is not visualized. No evidence of pulmonic stenosis. Aorta: The aortic root is normal in size and structure. Pulmonary Artery: The pulmonary artery is not well seen. Venous: The inferior vena cava was not well visualized. IAS/Shunts: The interatrial septum was not well visualized. Agitated saline contrast was given intravenously to evaluate for intracardiac shunting.  LEFT VENTRICLE PLAX 2D LVIDd:         3.33 cm LVIDs:         2.22  cm LV PW:         0.95 cm LV IVS:        0.83 cm LVOT diam:     2.00 cm LVOT Area:     3.14 cm  LEFT ATRIUM         Index LA diam:    1.80 cm 1.12  cm/m                        PULMONIC VALVE AORTA                 RVOT Peak grad: 3 mmHg Ao Root diam: 3.10 cm   SHUNTS Systemic Diam: 2.00 cm Nelva Bush MD Electronically signed by Nelva Bush MD Signature Date/Time: 02/11/2020/4:44:22 PM    Final      Medications:    . enoxaparin (LOVENOX) injection  1 mg/kg Subcutaneous Q12H  . feeding supplement (ENSURE ENLIVE)  237 mL Oral BID BM  . multivitamin with minerals  1 tablet Oral Daily  . predniSONE  20 mg Oral Q breakfast  . sodium chloride  1 g Oral TID WC  . thiamine  100 mg Oral Daily   acetaminophen **OR** acetaminophen, ondansetron **OR** ondansetron (ZOFRAN) IV, senna-docusate  Assessment/ Plan:  74 y.o. male with myasthenia gravis, lung cancer, osteoporosis, acute left DVT, PE after Covid pneumonia hospitalization January 2021, was admitted on 02/09/2020 with Hyponatremia [E87.1] Altered mental status, unspecified altered mental status type [R41.82] Acute confusional state [F05] .  #Hyponatremia.  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  Serum sodium up to 129.  Continue fluid restriction of 1 to 1.2 L of water per day.  Continue sodium chloride 1 g p.o. 3 times daily.  Could consider adding low-dose furosemide but hold off at this time.   LOS: 1 Boe Deans 3/6/202110:37 AM

## 2020-02-13 ENCOUNTER — Ambulatory Visit: Admission: RE | Admit: 2020-02-13 | Payer: Medicare HMO | Source: Ambulatory Visit

## 2020-02-13 LAB — CBC
HCT: 35.7 % — ABNORMAL LOW (ref 39.0–52.0)
Hemoglobin: 12.1 g/dL — ABNORMAL LOW (ref 13.0–17.0)
MCH: 33.2 pg (ref 26.0–34.0)
MCHC: 33.9 g/dL (ref 30.0–36.0)
MCV: 97.8 fL (ref 80.0–100.0)
Platelets: 274 10*3/uL (ref 150–400)
RBC: 3.65 MIL/uL — ABNORMAL LOW (ref 4.22–5.81)
RDW: 14.6 % (ref 11.5–15.5)
WBC: 11.7 10*3/uL — ABNORMAL HIGH (ref 4.0–10.5)
nRBC: 0 % (ref 0.0–0.2)

## 2020-02-13 LAB — MRSA PCR SCREENING: MRSA by PCR: NEGATIVE

## 2020-02-13 LAB — BASIC METABOLIC PANEL
Anion gap: 14 (ref 5–15)
BUN: 20 mg/dL (ref 8–23)
CO2: 27 mmol/L (ref 22–32)
Calcium: 9.1 mg/dL (ref 8.9–10.3)
Chloride: 93 mmol/L — ABNORMAL LOW (ref 98–111)
Creatinine, Ser: 0.74 mg/dL (ref 0.61–1.24)
GFR calc Af Amer: >60 mL/min (ref >60)
GFR calc non Af Amer: >60 mL/min (ref >60)
Glucose, Bld: 114 mg/dL — ABNORMAL HIGH (ref 70–99)
Potassium: 3.8 mmol/L (ref 3.5–5.1)
Sodium: 134 mmol/L — ABNORMAL LOW (ref 135–145)

## 2020-02-13 MED ORDER — VANCOMYCIN HCL 500 MG/100ML IV SOLN
500.0000 mg | Freq: Two times a day (BID) | INTRAVENOUS | Status: DC
  Administered 2020-02-13 – 2020-02-14 (×3): 500 mg via INTRAVENOUS
  Filled 2020-02-13 (×4): qty 100

## 2020-02-13 NOTE — Progress Notes (Signed)
[facility]  ROUNDING NOTE   Subjective:  Patient seen at bedside. Serum sodium now up to 134. Still has some periods of confusion however.   Objective:  Vital signs in last 24 hours:  Temp:  [97.8 F (36.6 C)-103 F (39.4 C)] 97.8 F (36.6 C) (03/07 0812) Pulse Rate:  [79-148] 95 (03/07 0812) Resp:  [16-18] 18 (03/07 0812) BP: (106-148)/(72-99) 132/76 (03/07 0812) SpO2:  [88 %-100 %] 99 % (03/07 0812)  Weight change:  Filed Weights   02/09/20 1656  Weight: 54 kg    Intake/Output: I/O last 3 completed shifts: In: 1277 [P.O.:827; IV Piggyback:450] Out: 3500 [Urine:3500]   Intake/Output this shift:  No intake/output data recorded.  Physical Exam: General: No acute distress  Head: Normocephalic, atraumatic. Moist oral mucosal membranes  Eyes: Anicteric  Neck: Supple, trachea midline  Lungs:  Clear to auscultation, normal effort  Heart: S1S2 no rubs  Abdomen:  Soft, nontender, bowel sounds present  Extremities: No peripheral edema.  Neurologic: Awake, alert, following commands  Skin: No lesions       Basic Metabolic Panel: Recent Labs  Lab 02/09/20 1701 02/09/20 1701 02/10/20 0802 02/10/20 0802 02/11/20 0911 02/11/20 2152 02/12/20 0503 02/13/20 0628  NA 128*   < > 129*  --  124* 121* 129* 134*  K 4.7  --  3.6  --  3.5  --  3.7 3.8  CL 90*  --  90*  --  89*  --  94* 93*  CO2 26  --  29  --  26  --  28 27  GLUCOSE 137*  --  97  --  107*  --  110* 114*  BUN 17  --  11  --  12  --  17 20  CREATININE 0.90  --  0.71  --  0.77  --  0.77 0.74  CALCIUM 10.1   < > 9.2   < > 8.9  --  9.0 9.1   < > = values in this interval not displayed.    Liver Function Tests: Recent Labs  Lab 02/09/20 1701  AST 27  ALT 31  ALKPHOS 63  BILITOT 0.6  PROT 7.1  ALBUMIN 3.6   No results for input(s): LIPASE, AMYLASE in the last 168 hours. No results for input(s): AMMONIA in the last 168 hours.  CBC: Recent Labs  Lab 02/09/20 1701 02/10/20 0802  02/13/20 0628  WBC 10.6* 10.0 11.7*  HGB 12.6* 12.8* 12.1*  HCT 37.6* 37.7* 35.7*  MCV 98.4 98.2 97.8  PLT 322 279 274    Cardiac Enzymes: No results for input(s): CKTOTAL, CKMB, CKMBINDEX, TROPONINI in the last 168 hours.  BNP: Invalid input(s): POCBNP  CBG: No results for input(s): GLUCAP in the last 168 hours.  Microbiology: Results for orders placed or performed during the hospital encounter of 02/09/20  CULTURE, BLOOD (ROUTINE X 2) w Reflex to ID Panel     Status: None (Preliminary result)   Collection Time: 02/12/20  3:59 PM   Specimen: BLOOD  Result Value Ref Range Status   Specimen Description BLOOD RAC  Final   Special Requests   Final    BOTTLES DRAWN AEROBIC AND ANAEROBIC Blood Culture results may not be optimal due to an excessive volume of blood received in culture bottles   Culture   Final    NO GROWTH < 24 HOURS Performed at [facility], [address] [place], [place] [address]    Report Status PENDING  Incomplete  CULTURE, BLOOD (ROUTINE X 2) w Reflex to ID Panel     Status: None (Preliminary result)   Collection Time: 02/12/20  4:07 PM   Specimen: BLOOD RIGHT HAND  Result Value Ref Range Status   Specimen Description BLOOD RIGHT HAND [facility]  Final   Special Requests   Final    BOTTLES DRAWN AEROBIC AND ANAEROBIC Blood Culture adequate volume   Culture   Final    NO GROWTH < 24 HOURS Performed at [facility], [address] [place], [place] [address]    Report Status PENDING  Incomplete    Coagulation Studies: No results for input(s): LABPROT, INR in the last 72 hours.  Urinalysis: Recent Labs    02/12/20 2028  COLORURINE YELLOW*  LABSPEC 1.023  PHURINE 6.0  GLUCOSEU NEGATIVE  HGBUR NEGATIVE  BILIRUBINUR NEGATIVE  KETONESUR 5*  PROTEINUR NEGATIVE  NITRITE NEGATIVE  LEUKOCYTESUR NEGATIVE      Imaging: CT ANGIO HEAD W OR WO CONTRAST  Result Date: 02/12/2020 CLINICAL DATA:  Confusion, weakness; possible cerebellar  strokes on MRI EXAM: CT ANGIOGRAPHY HEAD AND NECK TECHNIQUE: Multidetector CT imaging of the head and neck was performed using the standard protocol during bolus administration of intravenous contrast. Multiplanar CT image reconstructions and MIPs were obtained to evaluate the vascular anatomy. Carotid stenosis measurements (when applicable) are obtained utilizing NASCET criteria, using the distal internal carotid diameter as the denominator. CONTRAST:  63mL OMNIPAQUE IOHEXOL 350 MG/ML SOLN COMPARISON:  Prior CT and MR imaging FINDINGS: CT HEAD FINDINGS Brain: There is no acute intracranial hemorrhage, mass effect, or edema. No new loss of gray differentiation. Cerebellar signal abnormality on MRI is not visualized by CT. Ventricles are stable in size. Patchy hypoattenuation in the supratentorial white matter likely reflects stable chronic microvascular ischemic changes. There is no extra-axial fluid collection. Vascular: No new finding. Skull: Unremarkable. Sinuses: Aerated Orbits: No new finding. Review of the MIP images confirms the above findings CTA NECK FINDINGS Aortic arch: Great vessel origins are patent. Right carotid system: Patent.  No measurable stenosis. Left carotid system: Patent. Minimal calcified plaque at the ICA origin. No measurable stenosis. Vertebral arteries: Patent and codominant. Skeleton: No acute abnormality. 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 Other neck: No neck mass or adenopathy. Upper chest: Partially imaged right lower lobe mass. Interstitial thickening is again noted with increased patchy density. Review of the MIP images confirms the above findings CTA HEAD FINDINGS Anterior circulation: Intracranial internal carotid arteries are patent. Anterior and middle cerebral arteries are patent. Posterior circulation: Intracranial vertebral arteries, basilar artery, and posterior cerebral arteries are patent. A posterior communicating  artery is identified on the left. Venous sinuses: As permitted by contrast timing, patent. Anatomic variants: Fetal origin of the left PCA. Review of the MIP images confirms the above findings IMPRESSION: No acute intracranial abnormality. Findings on MRI are not visible on this study. Minimal plaque at the ICA origins.  No measurable stenosis. No proximal intracranial vessel occlusion or significant stenosis. Partially imaged right lower lobe mass may be increased in size. Chronic interstitial changes in the visualized upper lungs with superimposed nonspecific increased patchy density. Electronically Signed   By: Macy Mis M.D.   On: 02/12/2020 09:44   CT ANGIO NECK W OR WO CONTRAST  Result Date: 02/12/2020 CLINICAL DATA:  Confusion, weakness; possible cerebellar strokes on MRI EXAM: CT ANGIOGRAPHY HEAD AND NECK TECHNIQUE: Multidetector CT imaging of the head and neck  was performed using the standard protocol during bolus administration of intravenous contrast. Multiplanar CT image reconstructions and MIPs were obtained to evaluate the vascular anatomy. Carotid stenosis measurements (when applicable) are obtained utilizing NASCET criteria, using the distal internal carotid diameter as the denominator. CONTRAST:  58mL OMNIPAQUE IOHEXOL 350 MG/ML SOLN COMPARISON:  Prior CT and MR imaging FINDINGS: CT HEAD FINDINGS Brain: There is no acute intracranial hemorrhage, mass effect, or edema. No new loss of gray differentiation. Cerebellar signal abnormality on MRI is not visualized by CT. Ventricles are stable in size. Patchy hypoattenuation in the supratentorial white matter likely reflects stable chronic microvascular ischemic changes. There is no extra-axial fluid collection. Vascular: No new finding. Skull: Unremarkable. Sinuses: Aerated Orbits: No new finding. Review of the MIP images confirms the above findings CTA NECK FINDINGS Aortic arch: Great vessel origins are patent. Right carotid system: Patent.  No  measurable stenosis. Left carotid system: Patent. Minimal calcified plaque at the ICA origin. No measurable stenosis. Vertebral arteries: Patent and codominant. Skeleton: No acute abnormality. 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 Other neck: No neck mass or adenopathy. Upper chest: Partially imaged right lower lobe mass. Interstitial thickening is again noted with increased patchy density. Review of the MIP images confirms the above findings CTA HEAD FINDINGS Anterior circulation: Intracranial internal carotid arteries are patent. Anterior and middle cerebral arteries are patent. Posterior circulation: Intracranial vertebral arteries, basilar artery, and posterior cerebral arteries are patent. A posterior communicating artery is identified on the left. Venous sinuses: As permitted by contrast timing, patent. Anatomic variants: Fetal origin of the left PCA. Review of the MIP images confirms the above findings IMPRESSION: No acute intracranial abnormality. Findings on MRI are not visible on this study. Minimal plaque at the ICA origins.  No measurable stenosis. No proximal intracranial vessel occlusion or significant stenosis. Partially imaged right lower lobe mass may be increased in size. Chronic interstitial changes in the visualized upper lungs with superimposed nonspecific increased patchy density. Electronically Signed   By: Macy Mis M.D.   On: 02/12/2020 09:44   US Carotid Bilateral  Result Date: 02/11/2020 CLINICAL DATA:  74 year old male with stroke-like symptoms EXAM: BILATERAL CAROTID DUPLEX ULTRASOUND TECHNIQUE: Pearline Cables scale imaging, color Doppler and duplex ultrasound were performed of bilateral carotid and vertebral arteries in the neck. COMPARISON:  None. FINDINGS: Criteria: Quantification of carotid stenosis is based on velocity parameters that correlate the residual internal carotid diameter with NASCET-based stenosis levels, using the  diameter of the distal internal carotid lumen as the denominator for stenosis measurement. The following velocity measurements were obtained: RIGHT ICA: 84/27 cm/sec CCA: 30/86 cm/sec SYSTOLIC ICA/CCA RATIO:  1.3 ECA:  81 cm/sec LEFT ICA: 66/19 cm/sec CCA: 57/84 cm/sec SYSTOLIC ICA/CCA RATIO:  1.1 ECA:  87 cm/sec RIGHT CAROTID ARTERY: No significant atherosclerotic plaque or evidence of stenosis in the internal carotid artery. RIGHT VERTEBRAL ARTERY:  Patent with normal antegrade flow. LEFT CAROTID ARTERY: No significant atherosclerotic plaque or evidence of stenosis in the internal carotid artery. LEFT VERTEBRAL ARTERY:  Patent with normal antegrade flow. IMPRESSION: 1. No significant atherosclerotic plaque or evidence of stenosis in the internal carotid artery. 2. Vertebral arteries are patent with normal antegrade flow. Signed, Criselda Peaches, MD, [facility] Radiology Electronically Signed   By: Jacqulynn Cadet M.D.   On: 02/11/2020 16:28   DG Chest Port 1 View  Result Date: 02/12/2020 CLINICAL DATA:  74 year old male with  fever. History of lung cancer. Positive COVID-19. EXAM: PORTABLE CHEST 1 VIEW COMPARISON:  Chest radiograph dated 02/02/2020. FINDINGS: There is background of emphysema. Bilateral confluent density primarily in the peripheral and subpleural lungs consistent with multifocal pneumonia and in keeping with known COVID-19. Clinical correlation and follow-up recommended. No large pleural effusion or pneumothorax. Stable cardiomediastinal silhouette. Atherosclerotic calcification of the aorta. Osteopenia with degenerative changes of the spine. No acute osseous pathology. Old healed rib fractures. IMPRESSION: Multifocal pneumonia. Clinical correlation and follow-up recommended. Electronically Signed   By: Anner Crete M.D.   On: 02/12/2020 18:33   ECHOCARDIOGRAM COMPLETE BUBBLE STUDY  Result Date: 02/11/2020    ECHOCARDIOGRAM  REPORT   Patient Name:   Duane Santos Date of Exam: 02/11/2020 Medical Rec #:  [id number]       Height:       66.0 in Accession #:    [id number]      Weight:       119.0 lb Date of Birth:  [birth year]       BSA:          1.604 m Patient Age:    5 years        BP:           130/84 mmHg Patient Gender: M               HR:           96 bpm. Exam Location:  [facility] Procedure: 2D Echo, Cardiac Doppler, Color Doppler and Saline Contrast Bubble            Study Indications:     Stroke 434.91  History:         Patient has no prior history of Echocardiogram examinations.  Sonographer:     Sherrie Sport RDCS (AE) Referring Phys:  [id number] Jennye Boroughs Diagnosing Phys: Nelva Bush MD  Sonographer Comments: Technically difficult study due to poor echo windows, no apical window and no subcostal window. IMPRESSIONS  1. Left ventricular ejection fraction, by estimation, is 65 to 70%. The left ventricle has normal function. Left ventricular endocardial border not optimally defined to evaluate regional wall motion. Left ventricular diastolic function could not be evaluated.  2. Right ventricular systolic function is normal. The right ventricular size is normal. Tricuspid regurgitation signal is inadequate for assessing PA pressure.  3. The mitral valve is grossly normal. No evidence of mitral valve regurgitation.  4. The aortic valve was not well visualized. Aortic valve regurgitation not well-assessed. Aortic stenosis was not well-assessed. FINDINGS  Left Ventricle: Left ventricular ejection fraction, by estimation, is 65 to 70%. The left ventricle has normal function. Left ventricular endocardial border not optimally defined to evaluate regional wall motion. The left ventricular internal cavity size was normal in size. There is no left ventricular hypertrophy. Left ventricular diastolic function could not be evaluated. Right Ventricle: The right ventricular size is normal. No increase in right ventricular wall thickness. Right  ventricular systolic function is normal. Tricuspid regurgitation signal is inadequate for assessing PA pressure. Left Atrium: Left atrial size was not well visualized. Right Atrium: Right atrial size was not well visualized. Pericardium: The pericardium was not well visualized. Mitral Valve: The mitral valve is grossly normal. No evidence of mitral valve regurgitation. Tricuspid Valve: The tricuspid valve is grossly normal. Tricuspid valve regurgitation is trivial. Aortic Valve: The aortic valve was not well visualized. Aortic valve regurgitation not well-assessed. Aortic stenosis was not well-assessed. Pulmonic Valve: The pulmonic valve was not  well visualized. Pulmonic valve regurgitation is not visualized. No evidence of pulmonic stenosis. Aorta: The aortic root is normal in size and structure. Pulmonary Artery: The pulmonary artery is not well seen. Venous: The inferior vena cava was not well visualized. IAS/Shunts: The interatrial septum was not well visualized. Agitated saline contrast was given intravenously to evaluate for intracardiac shunting.  LEFT VENTRICLE PLAX 2D LVIDd:         3.33 cm LVIDs:         2.22 cm LV PW:         0.95 cm LV IVS:        0.83 cm LVOT diam:     2.00 cm LVOT Area:     3.14 cm  LEFT ATRIUM         Index LA diam:    1.80 cm 1.12 cm/m                        PULMONIC VALVE AORTA                 RVOT Peak grad: 3 mmHg Ao Root diam: 3.10 cm   SHUNTS Systemic Diam: 2.00 cm Nelva Bush MD Electronically signed by Nelva Bush MD Signature Date/Time: 02/11/2020/4:44:22 PM    Final      Medications:   . ceFEPime (MAXIPIME) IV 2 g (02/13/20 0416)  . vancomycin     . enoxaparin (LOVENOX) injection  1 mg/kg Subcutaneous Q12H  . feeding supplement (ENSURE ENLIVE)  237 mL Oral BID BM  . multivitamin with minerals  1 tablet Oral Daily  . predniSONE  20 mg Oral Q breakfast  . sodium chloride  1 g Oral TID WC  . thiamine  100 mg Oral Daily   acetaminophen **OR**  acetaminophen, ondansetron **OR** ondansetron (ZOFRAN) IV, senna-docusate  Assessment/ Plan:  74 y.o. male with myasthenia gravis, lung cancer, osteoporosis, acute left DVT, PE after Covid pneumonia hospitalization January 2021, was admitted on 02/09/2020 with Hyponatremia [E87.1] Altered mental status, unspecified altered mental status type [R41.82] Acute confusional state [F05] .  #Hyponatremia.  The patient serum sodium has improved significantly and currently up to 134.  Continue fluid restriction of 1 to 1.2 L of free water per day.  In addition continue sodium chloride 1 g p.o. 3 times daily.  This was discussed with the patient's wife.  #Altered mental status.  Serum sodium has improved significantly.  However he still has periods of confusion and delirium.  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  Appreciate input from neurology.   LOS: 2 Vilas Edgerly 3/7/202112:06 PM

## 2020-02-13 NOTE — Progress Notes (Addendum)
Progress Note    Duane Santos  VHQ:[id number] DOB: 1946-03-14  DOA: 02/09/2020 PCP: [place]      Brief Narrative:    Medical records reviewed and are as summarized below:  Duane Santos is an 74 y.o. male with medical history significant for myasthenia gravis, history of COVID-19 pneumonia with hypoxia diagnosed 12/13/2019, hospitalized from 12/21/2019 to 01/03/2020 discharged on oxygen, currently on O2 at 3 L, who also has a history of right lower lobe mass diagnosed during his last hospitalization, last seen by oncology on 01/28/2020, on Eliquis for PE and DVT diagnosed during his last hospitalization, who presented to the emergency room with two weeks off onset confusion around 01/31/20, associated with persistent protracted weakness and falls since his hospitalization.  History is taken from his wife and caregiver due to patient's confusion.    He has a history of myasthenia gravis on prednisone and he was advised by his neurologist not to take any prednisone more than 20 mg.  Reportedly, prior to his COVID-19 infection, he was very independent but since then he has been slowly declining and has required a walker formulation.      Assessment/Plan:   Principal Problem:   Acute metabolic encephalopathy Active Problems:   Multifocal pneumonia   Myasthenia gravis ([place])   Right lower lobe lung mass   Generalized weakness   Hyponatremia   History of COVID-19 pneumonia with hypoxia   Chronic respiratory failure with hypoxia (HCC)   Palliative care encounter   Acute confusional state   Acute confusional state: Intermittent.  Continue supportive care  Acute embolic stroke: Case was discussed with neurologist, Dr. Irish Elders, and he thinks the MRI findings are suggestive of embolic stroke.  He said there was no need to repeat MRI brain.  He had discussed this with the patient's wife who is agreeable.  2D echo showed EF estimated 65 to 70% and carotid duplex did not show  any significant atherosclerotic plaque or stenosis.  Fever, multifocal pneumonia, probable hospital-acquired: Chest x-ray on 02/12/2020 showed multifocal pneumonia.  Continue IV cefepime and vancomycin.  Follow-up blood cultures.  Hyponatremia: Improving. 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  Continue salt tablets.  Monitor BMP.  Right lung mass suspicious for malignancy, sclerotic changes of the right third fourth and fifth ribs suspicious for metastatic disease: Follow-up with pulmonologist.  Plan for biopsy noted.  He recommended that Eliquis be held for 7 days prior to biopsy.  Follow-up with palliative care.  Recent pulmonary embolism and DVT diagnosed on December 23, 2019: Eliquis has been held because of plan for biopsy.  Continue Lovenox bridge for treatment of pulmonary embolism.  Myasthenia gravis: Continue prednisone (his wife said his primary neurologist recommended that the dose should not be below 20 mg daily)  Recent COVID-19 pneumonia/chronic hypoxemic respiratory failure: Continue oxygen via nasal cannula.  Generalized weakness/debility: PT and OT evaluation.  Body mass index is 19.21 kg/m.   Family Communication/Anticipated D/C date and plan/Code Status   DVT prophylaxis: Lovenox Code Status: DNR Family Communication: Plan discussed with his wife at the bedside Disposition Plan: Patient is from home.  Plan to discharge home after adequate treatment of pneumonia     Subjective:   No fever, cough, chest pain or shortness of breath.   Objective:    Vitals:   02/12/20 1935 02/12/20 2344 02/13/20 0359 02/13/20 0812  BP: 106/83 137/81 (!) 148/99 132/76  Pulse: 79 86 98 95  Resp: 16 16 18  18  Temp: 98.4 F (36.9 C) 98.4 F (36.9 C) 98.4 F (36.9 C) 97.8 F (36.6 C)  TempSrc: Oral Oral Oral   SpO2: 100% 100% 100% 99%  Weight:      Height:        Intake/Output Summary (Last 24 hours) at 02/13/2020 1158 Last data filed at 02/13/2020 2440 Gross per 24 hour  Intake 927  ml  Output 2300 ml  Net -1373 ml   Filed Weights   02/09/20 1656  Weight: 54 kg    Exam:  GEN: No acute distress SKIN: Abrasion on left arm covered with clean dressing EYES: EOMI ENT: MMM CV: RRR PULM: Air entry adequate bilaterally, no wheezing or rales heard ABD: soft, ND, NT, +BS CNS: AAO x 2 (person and place), confused, non focal EXT: No edema or tenderness   Data Reviewed:   I have personally reviewed following labs and imaging studies:  Labs: Labs show the following:   Basic Metabolic Panel: Recent Labs  Lab 02/09/20 1701 02/09/20 1701 02/10/20 0802 02/10/20 0802 02/11/20 0911 02/11/20 0911 02/11/20 2152 02/12/20 0503 02/13/20 0628  NA 128*   < > 129*  --  124*  --  121* 129* 134*  K 4.7   < > 3.6   < > 3.5   < >  --  3.7 3.8  CL 90*  --  90*  --  89*  --   --  94* 93*  CO2 26  --  29  --  26  --   --  28 27  GLUCOSE 137*  --  97  --  107*  --   --  110* 114*  BUN 17  --  11  --  12  --   --  17 20  CREATININE 0.90  --  0.71  --  0.77  --   --  0.77 0.74  CALCIUM 10.1  --  9.2  --  8.9  --   --  9.0 9.1   < > = values in this interval not displayed.   GFR Estimated Creatinine Clearance: 63.8 mL/min (by C-G formula based on SCr of 0.74 mg/dL). Liver Function Tests: Recent Labs  Lab 02/09/20 1701  AST 27  ALT 31  ALKPHOS 63  BILITOT 0.6  PROT 7.1  ALBUMIN 3.6   No results for input(s): LIPASE, AMYLASE in the last 168 hours. No results for input(s): AMMONIA in the last 168 hours. Coagulation profile Recent Labs  Lab 02/09/20 1701  INR 1.1    CBC: Recent Labs  Lab 02/09/20 1701 02/10/20 0802 02/13/20 0628  WBC 10.6* 10.0 11.7*  HGB 12.6* 12.8* 12.1*  HCT 37.6* 37.7* 35.7*  MCV 98.4 98.2 97.8  PLT 322 279 274   Cardiac Enzymes: No results for input(s): CKTOTAL, CKMB, CKMBINDEX, TROPONINI in the last 168 hours. BNP (last 3 results) No results for input(s): PROBNP in the last 8760 hours. CBG: No results for input(s): GLUCAP in  the last 168 hours. D-Dimer: No results for input(s): DDIMER in the last 72 hours. Hgb A1c: No results for input(s): HGBA1C in the last 72 hours. Lipid Profile: No results for input(s): CHOL, HDL, LDLCALC, TRIG, CHOLHDL, LDLDIRECT in the last 72 hours. Thyroid function studies: No results for input(s): TSH, T4TOTAL, T3FREE, THYROIDAB in the last 72 hours.  Invalid input(s): FREET3 Anemia work up: No results for input(s): VITAMINB12, FOLATE, FERRITIN, TIBC, IRON, RETICCTPCT in the last 72 hours. Sepsis Labs: Recent Labs  Lab  02/09/20 1701 02/10/20 0802 02/13/20 0628  WBC 10.6* 10.0 11.7*    Microbiology Recent Results (from the past 240 hour(s))  CULTURE, BLOOD (ROUTINE X 2) w Reflex to ID Panel     Status: None (Preliminary result)   Collection Time: 02/12/20  3:59 PM   Specimen: BLOOD  Result Value Ref Range Status   Specimen Description BLOOD RAC  Final   Special Requests   Final    BOTTLES DRAWN AEROBIC AND ANAEROBIC Blood Culture results may not be optimal due to an excessive volume of blood received in culture bottles   Culture   Final    NO GROWTH < 24 HOURS Performed at [facility], [address], [place], [place] [address]    Report Status PENDING  Incomplete  CULTURE, BLOOD (ROUTINE X 2) w Reflex to ID Panel     Status: None (Preliminary result)   Collection Time: 02/12/20  4:07 PM   Specimen: BLOOD RIGHT HAND  Result Value Ref Range Status   Specimen Description BLOOD RIGHT HAND [facility]  Final   Special Requests   Final    BOTTLES DRAWN AEROBIC AND ANAEROBIC Blood Culture adequate volume   Culture   Final    NO GROWTH < 24 HOURS Performed at [facility], [address], [place], [place] [address]    Report Status PENDING  Incomplete    Procedures and diagnostic studies:  CT ANGIO HEAD W OR WO CONTRAST  Result Date: 02/12/2020 CLINICAL DATA:  Confusion, weakness; possible cerebellar strokes on MRI EXAM: CT ANGIOGRAPHY HEAD AND NECK  TECHNIQUE: Multidetector CT imaging of the head and neck was performed using the standard protocol during bolus administration of intravenous contrast. Multiplanar CT image reconstructions and MIPs were obtained to evaluate the vascular anatomy. Carotid stenosis measurements (when applicable) are obtained utilizing NASCET criteria, using the distal internal carotid diameter as the denominator. CONTRAST:  62mL OMNIPAQUE IOHEXOL 350 MG/ML SOLN COMPARISON:  Prior CT and MR imaging FINDINGS: CT HEAD FINDINGS Brain: There is no acute intracranial hemorrhage, mass effect, or edema. No new loss of gray differentiation. Cerebellar signal abnormality on MRI is not visualized by CT. Ventricles are stable in size. Patchy hypoattenuation in the supratentorial white matter likely reflects stable chronic microvascular ischemic changes. There is no extra-axial fluid collection. Vascular: No new finding. Skull: Unremarkable. Sinuses: Aerated Orbits: No new finding. Review of the MIP images confirms the above findings CTA NECK FINDINGS Aortic arch: Great vessel origins are patent. Right carotid system: Patent.  No measurable stenosis. Left carotid system: Patent. Minimal calcified plaque at the ICA origin. No measurable stenosis. Vertebral arteries: Patent and codominant. Skeleton: No acute abnormality. 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 Other neck: No neck mass or adenopathy. Upper chest: Partially imaged right lower lobe mass. Interstitial thickening is again noted with increased patchy density. Review of the MIP images confirms the above findings CTA HEAD FINDINGS Anterior circulation: Intracranial internal carotid arteries are patent. Anterior and middle cerebral arteries are patent. Posterior circulation: Intracranial vertebral arteries, basilar artery, and posterior cerebral arteries are patent. A posterior communicating artery is identified on the left. Venous sinuses:  As permitted by contrast timing, patent. Anatomic variants: Fetal origin of the left PCA. Review of the MIP images confirms the above findings IMPRESSION: No acute intracranial abnormality. Findings on MRI are not visible on this study. Minimal plaque at the ICA origins.  No measurable stenosis. No proximal intracranial vessel occlusion  or significant stenosis. Partially imaged right lower lobe mass may be increased in size. Chronic interstitial changes in the visualized upper lungs with superimposed nonspecific increased patchy density. Electronically Signed   By: Macy Mis M.D.   On: 02/12/2020 09:44   CT ANGIO NECK W OR WO CONTRAST  Result Date: 02/12/2020 CLINICAL DATA:  Confusion, weakness; possible cerebellar strokes on MRI EXAM: CT ANGIOGRAPHY HEAD AND NECK TECHNIQUE: Multidetector CT imaging of the head and neck was performed using the standard protocol during bolus administration of intravenous contrast. Multiplanar CT image reconstructions and MIPs were obtained to evaluate the vascular anatomy. Carotid stenosis measurements (when applicable) are obtained utilizing NASCET criteria, using the distal internal carotid diameter as the denominator. CONTRAST:  75mL OMNIPAQUE IOHEXOL 350 MG/ML SOLN COMPARISON:  Prior CT and MR imaging FINDINGS: CT HEAD FINDINGS Brain: There is no acute intracranial hemorrhage, mass effect, or edema. No new loss of gray differentiation. Cerebellar signal abnormality on MRI is not visualized by CT. Ventricles are stable in size. Patchy hypoattenuation in the supratentorial white matter likely reflects stable chronic microvascular ischemic changes. There is no extra-axial fluid collection. Vascular: No new finding. Skull: Unremarkable. Sinuses: Aerated Orbits: No new finding. Review of the MIP images confirms the above findings CTA NECK FINDINGS Aortic arch: Great vessel origins are patent. Right carotid system: Patent.  No measurable stenosis. Left carotid system: Patent.  Minimal calcified plaque at the ICA origin. No measurable stenosis. Vertebral arteries: Patent and codominant. Skeleton: No acute abnormality. 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 Other neck: No neck mass or adenopathy. Upper chest: Partially imaged right lower lobe mass. Interstitial thickening is again noted with increased patchy density. Review of the MIP images confirms the above findings CTA HEAD FINDINGS Anterior circulation: Intracranial internal carotid arteries are patent. Anterior and middle cerebral arteries are patent. Posterior circulation: Intracranial vertebral arteries, basilar artery, and posterior cerebral arteries are patent. A posterior communicating artery is identified on the left. Venous sinuses: As permitted by contrast timing, patent. Anatomic variants: Fetal origin of the left PCA. Review of the MIP images confirms the above findings IMPRESSION: No acute intracranial abnormality. Findings on MRI are not visible on this study. Minimal plaque at the ICA origins.  No measurable stenosis. No proximal intracranial vessel occlusion or significant stenosis. Partially imaged right lower lobe mass may be increased in size. Chronic interstitial changes in the visualized upper lungs with superimposed nonspecific increased patchy density. Electronically Signed   By: Macy Mis M.D.   On: 02/12/2020 09:44   US Carotid Bilateral  Result Date: 02/11/2020 CLINICAL DATA:  74 year old male with stroke-like symptoms EXAM: BILATERAL CAROTID DUPLEX ULTRASOUND TECHNIQUE: Pearline Cables scale imaging, color Doppler and duplex ultrasound were performed of bilateral carotid and vertebral arteries in the neck. COMPARISON:  None. FINDINGS: Criteria: Quantification of carotid stenosis is based on velocity parameters that correlate the residual internal carotid diameter with NASCET-based stenosis levels, using the diameter of the distal internal carotid lumen as the  denominator for stenosis measurement. The following velocity measurements were obtained: RIGHT ICA: 84/27 cm/sec CCA: 74/12 cm/sec SYSTOLIC ICA/CCA RATIO:  1.3 ECA:  81 cm/sec LEFT ICA: 66/19 cm/sec CCA: 87/86 cm/sec SYSTOLIC ICA/CCA RATIO:  1.1 ECA:  87 cm/sec RIGHT CAROTID ARTERY: No significant atherosclerotic plaque or evidence of stenosis in the internal carotid artery. RIGHT VERTEBRAL ARTERY:  Patent with normal antegrade flow. LEFT CAROTID ARTERY: No significant atherosclerotic plaque or evidence of stenosis in the  internal carotid artery. LEFT VERTEBRAL ARTERY:  Patent with normal antegrade flow. IMPRESSION: 1. No significant atherosclerotic plaque or evidence of stenosis in the internal carotid artery. 2. Vertebral arteries are patent with normal antegrade flow. Signed, Criselda Peaches, MD, [facility] Electronically Signed   By: Jacqulynn Cadet M.D.   On: 02/11/2020 16:28   DG Chest Port 1 View  Result Date: 02/12/2020 CLINICAL DATA:  74 year old male with fever. History of lung cancer. Positive COVID-19. EXAM: PORTABLE CHEST 1 VIEW COMPARISON:  Chest radiograph dated 02/02/2020. FINDINGS: There is background of emphysema. Bilateral confluent density primarily in the peripheral and subpleural lungs consistent with multifocal pneumonia and in keeping with known COVID-19. Clinical correlation and follow-up recommended. No large pleural effusion or pneumothorax. Stable cardiomediastinal silhouette. Atherosclerotic calcification of the aorta. Osteopenia with degenerative changes of the spine. No acute osseous pathology. Old healed rib fractures. IMPRESSION: Multifocal pneumonia. Clinical correlation and follow-up recommended. Electronically Signed   By: Anner Crete M.D.   On: 02/12/2020 18:33   ECHOCARDIOGRAM COMPLETE BUBBLE STUDY  Result Date: 02/11/2020    ECHOCARDIOGRAM REPORT   Patient Name:   ALDO SONDGEROTH Date of Exam:  02/11/2020 Medical Rec #:  [id number]       Height:       66.0 in Accession #:    [id number]      Weight:       119.0 lb Date of Birth:  05-23-1946       BSA:          1.604 m Patient Age:    72 years        BP:           130/84 mmHg Patient Gender: M               HR:           96 bpm. Exam Location:  [facility] Procedure: 2D Echo, Cardiac Doppler, Color Doppler and Saline Contrast Bubble            Study Indications:     Stroke 434.91  History:         Patient has no prior history of Echocardiogram examinations.  Sonographer:     Sherrie Sport RDCS (AE) Referring Phys:  [id number] Jennye Boroughs Diagnosing Phys: Nelva Bush MD  Sonographer Comments: Technically difficult study due to poor echo windows, no apical window and no subcostal window. IMPRESSIONS  1. Left ventricular ejection fraction, by estimation, is 65 to 70%. The left ventricle has normal function. Left ventricular endocardial border not optimally defined to evaluate regional wall motion. Left ventricular diastolic function could not be evaluated.  2. Right ventricular systolic function is normal. The right ventricular size is normal. Tricuspid regurgitation signal is inadequate for assessing PA pressure.  3. The mitral valve is grossly normal. No evidence of mitral valve regurgitation.  4. The aortic valve was not well visualized. Aortic valve regurgitation not well-assessed. Aortic stenosis was not well-assessed. FINDINGS  Left Ventricle: Left ventricular ejection fraction, by estimation, is 65 to 70%. The left ventricle has normal function. Left ventricular endocardial border not optimally defined to evaluate regional wall motion. The left ventricular internal cavity size was normal in size. There is no left ventricular hypertrophy. Left ventricular diastolic function could not be evaluated. Right Ventricle: The right ventricular size is normal. No increase in right ventricular wall thickness. Right ventricular systolic function is normal. Tricuspid  regurgitation signal is inadequate for assessing  PA pressure. Left Atrium: Left atrial size was not well visualized. Right Atrium: Right atrial size was not well visualized. Pericardium: The pericardium was not well visualized. Mitral Valve: The mitral valve is grossly normal. No evidence of mitral valve regurgitation. Tricuspid Valve: The tricuspid valve is grossly normal. Tricuspid valve regurgitation is trivial. Aortic Valve: The aortic valve was not well visualized. Aortic valve regurgitation not well-assessed. Aortic stenosis was not well-assessed. Pulmonic Valve: The pulmonic valve was not well visualized. Pulmonic valve regurgitation is not visualized. No evidence of pulmonic stenosis. Aorta: The aortic root is normal in size and structure. Pulmonary Artery: The pulmonary artery is not well seen. Venous: The inferior vena cava was not well visualized. IAS/Shunts: The interatrial septum was not well visualized. Agitated saline contrast was given intravenously to evaluate for intracardiac shunting.  LEFT VENTRICLE PLAX 2D LVIDd:         3.33 cm LVIDs:         2.22 cm LV PW:         0.95 cm LV IVS:        0.83 cm LVOT diam:     2.00 cm LVOT Area:     3.14 cm  LEFT ATRIUM         Index LA diam:    1.80 cm 1.12 cm/m                        PULMONIC VALVE AORTA                 RVOT Peak grad: 3 mmHg Ao Root diam: 3.10 cm   SHUNTS Systemic Diam: 2.00 cm Nelva Bush MD Electronically signed by Nelva Bush MD Signature Date/Time: 02/11/2020/4:44:22 PM    Final     Medications:   . enoxaparin (LOVENOX) injection  1 mg/kg Subcutaneous Q12H  . feeding supplement (ENSURE ENLIVE)  237 mL Oral BID BM  . multivitamin with minerals  1 tablet Oral Daily  . predniSONE  20 mg Oral Q breakfast  . sodium chloride  1 g Oral TID WC  . thiamine  100 mg Oral Daily   Continuous Infusions: . ceFEPime (MAXIPIME) IV 2 g (02/13/20 0416)  . vancomycin       LOS: 2 days   Shian Goodnow  [facility]      02/13/2020, 11:58 AM

## 2020-02-13 NOTE — Progress Notes (Signed)
Subjective: Still periods of confusion but more awake  Objective: Current vital signs: BP 132/76 (BP Location: Right Arm)   Pulse 95   Temp 97.8 F (36.6 C)   Resp 18   Ht 5\' 6"  (1.676 m)   Wt 54 kg   SpO2 99%   BMI 19.21 kg/m  Vital signs in last 24 hours: Temp:  [97.8 F (36.6 C)-103 F (39.4 C)] 97.8 F (36.6 C) (03/07 0812) Pulse Rate:  [79-148] 95 (03/07 0812) Resp:  [16-18] 18 (03/07 0812) BP: (106-148)/(72-99) 132/76 (03/07 0812) SpO2:  [88 %-100 %] 99 % (03/07 0812)  Intake/Output from previous day: 03/06 0701 - 03/07 0700 In: 1277 [P.O.:827; IV Piggyback:450] Out: 3546 [Urine:2300] Intake/Output this shift: No intake/output data recorded. Nutritional status:  Diet Order            DIET DYS 3 Room service appropriate? Yes with Assist; Fluid consistency: Thin  Diet effective now              Neurologic Exam: Mental Status: Alert, oriented X 4.  At times does not answer questioning and just stares ahead.  Speech fluent without evidence of aphasia but has some evidence of apraxia.  Able to follow 3 step commands without difficulty. Cranial Nerves: II: Visual fields grossly normal, pupils equal, round, reactive to light and accommodation III,IV, VI: ptosis not present, extra-ocular motions intact bilaterally V,VII: smile symmetric, facial light touch sensation normal bilaterally VIII: hearing normal bilaterally IX,X: gag reflex present XI: bilateral shoulder shrug XII: midline tongue extension Motor: Able to lift all extremities against gravity and maintain with some mild generalized weakness noted  Lab Results: Basic Metabolic Panel: Recent Labs  Lab 02/09/20 1701 02/09/20 1701 02/10/20 0802 02/10/20 0802 02/11/20 0911 02/11/20 2152 02/12/20 0503 02/13/20 0628  NA 128*   < > 129*  --  124* 121* 129* 134*  K 4.7  --  3.6  --  3.5  --  3.7 3.8  CL 90*  --  90*  --  89*  --  94* 93*  CO2 26  --  29  --  26  --  28 27  GLUCOSE 137*  --  97  --   107*  --  110* 114*  BUN 17  --  11  --  12  --  17 20  CREATININE 0.90  --  0.71  --  0.77  --  0.77 0.74  CALCIUM 10.1   < > 9.2   < > 8.9  --  9.0 9.1   < > = values in this interval not displayed.    Liver Function Tests: Recent Labs  Lab 02/09/20 1701  AST 27  ALT 31  ALKPHOS 63  BILITOT 0.6  PROT 7.1  ALBUMIN 3.6   No results for input(s): LIPASE, AMYLASE in the last 168 hours. No results for input(s): AMMONIA in the last 168 hours.  CBC: Recent Labs  Lab 02/09/20 1701 02/10/20 0802 02/13/20 0628  WBC 10.6* 10.0 11.7*  HGB 12.6* 12.8* 12.1*  HCT 37.6* 37.7* 35.7*  MCV 98.4 98.2 97.8  PLT 322 279 274    Cardiac Enzymes: No results for input(s): CKTOTAL, CKMB, CKMBINDEX, TROPONINI in the last 168 hours.  Lipid Panel: No results for input(s): CHOL, TRIG, HDL, CHOLHDL, VLDL, LDLCALC in the last 168 hours.  CBG: No results for input(s): GLUCAP in the last 168 hours.  Microbiology: Results for orders placed or performed during the hospital encounter of 02/09/20  CULTURE, BLOOD (ROUTINE X 2) w Reflex to ID Panel     Status: None (Preliminary result)   Collection Time: 02/12/20  3:59 PM   Specimen: BLOOD  Result Value Ref Range Status   Specimen Description BLOOD RAC  Final   Special Requests   Final    BOTTLES DRAWN AEROBIC AND ANAEROBIC Blood Culture results may not be optimal due to an excessive volume of blood received in culture bottles   Culture   Final    NO GROWTH < 24 HOURS Performed at [facility], [address] [place], [place] [address]    Report Status PENDING  Incomplete  CULTURE, BLOOD (ROUTINE X 2) w Reflex to ID Panel     Status: None (Preliminary result)   Collection Time: 02/12/20  4:07 PM   Specimen: BLOOD RIGHT HAND  Result Value Ref Range Status   Specimen Description BLOOD RIGHT HAND [facility]  Final   Special Requests   Final    BOTTLES DRAWN AEROBIC AND ANAEROBIC Blood Culture adequate volume   Culture   Final    NO GROWTH  < 24 HOURS Performed at [facility], [address] [place], [place] [address]    Report Status PENDING  Incomplete    Coagulation Studies: No results for input(s): LABPROT, INR in the last 72 hours.  Imaging: CT ANGIO HEAD W OR WO CONTRAST  Result Date: 02/12/2020 CLINICAL DATA:  Confusion, weakness; possible cerebellar strokes on MRI EXAM: CT ANGIOGRAPHY HEAD AND NECK TECHNIQUE: Multidetector CT imaging of the head and neck was performed using the standard protocol during bolus administration of intravenous contrast. Multiplanar CT image reconstructions and MIPs were obtained to evaluate the vascular anatomy. Carotid stenosis measurements (when applicable) are obtained utilizing NASCET criteria, using the distal internal carotid diameter as the denominator. CONTRAST:  1mL OMNIPAQUE IOHEXOL 350 MG/ML SOLN COMPARISON:  Prior CT and MR imaging FINDINGS: CT HEAD FINDINGS Brain: There is no acute intracranial hemorrhage, mass effect, or edema. No new loss of gray differentiation. Cerebellar signal abnormality on MRI is not visualized by CT. Ventricles are stable in size. Patchy hypoattenuation in the supratentorial white matter likely reflects stable chronic microvascular ischemic changes. There is no extra-axial fluid collection. Vascular: No new finding. Skull: Unremarkable. Sinuses: Aerated Orbits: No new finding. Review of the MIP images confirms the above findings CTA NECK FINDINGS Aortic arch: Great vessel origins are patent. Right carotid system: Patent.  No measurable stenosis. Left carotid system: Patent. Minimal calcified plaque at the ICA origin. No measurable stenosis. Vertebral arteries: Patent and codominant. Skeleton: No acute abnormality. 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 Other neck: No neck mass or adenopathy. Upper chest: Partially imaged right lower lobe mass. Interstitial thickening is again noted with increased patchy  density. Review of the MIP images confirms the above findings CTA HEAD FINDINGS Anterior circulation: Intracranial internal carotid arteries are patent. Anterior and middle cerebral arteries are patent. Posterior circulation: Intracranial vertebral arteries, basilar artery, and posterior cerebral arteries are patent. A posterior communicating artery is identified on the left. Venous sinuses: As permitted by contrast timing, patent. Anatomic variants: Fetal origin of the left PCA. Review of the MIP images confirms the above findings IMPRESSION: No acute intracranial abnormality. Findings on MRI are not visible on this study. Minimal plaque at the ICA origins.  No measurable stenosis. No proximal intracranial vessel occlusion or significant stenosis. Partially imaged right lower lobe mass may be increased in  size. Chronic interstitial changes in the visualized upper lungs with superimposed nonspecific increased patchy density. Electronically Signed   By: Macy Mis M.D.   On: 02/12/2020 09:44   CT ANGIO NECK W OR WO CONTRAST  Result Date: 02/12/2020 CLINICAL DATA:  Confusion, weakness; possible cerebellar strokes on MRI EXAM: CT ANGIOGRAPHY HEAD AND NECK TECHNIQUE: Multidetector CT imaging of the head and neck was performed using the standard protocol during bolus administration of intravenous contrast. Multiplanar CT image reconstructions and MIPs were obtained to evaluate the vascular anatomy. Carotid stenosis measurements (when applicable) are obtained utilizing NASCET criteria, using the distal internal carotid diameter as the denominator. CONTRAST:  50mL OMNIPAQUE IOHEXOL 350 MG/ML SOLN COMPARISON:  Prior CT and MR imaging FINDINGS: CT HEAD FINDINGS Brain: There is no acute intracranial hemorrhage, mass effect, or edema. No new loss of gray differentiation. Cerebellar signal abnormality on MRI is not visualized by CT. Ventricles are stable in size. Patchy hypoattenuation in the supratentorial white matter  likely reflects stable chronic microvascular ischemic changes. There is no extra-axial fluid collection. Vascular: No new finding. Skull: Unremarkable. Sinuses: Aerated Orbits: No new finding. Review of the MIP images confirms the above findings CTA NECK FINDINGS Aortic arch: Great vessel origins are patent. Right carotid system: Patent.  No measurable stenosis. Left carotid system: Patent. Minimal calcified plaque at the ICA origin. No measurable stenosis. Vertebral arteries: Patent and codominant. Skeleton: No acute abnormality. 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 Other neck: No neck mass or adenopathy. Upper chest: Partially imaged right lower lobe mass. Interstitial thickening is again noted with increased patchy density. Review of the MIP images confirms the above findings CTA HEAD FINDINGS Anterior circulation: Intracranial internal carotid arteries are patent. Anterior and middle cerebral arteries are patent. Posterior circulation: Intracranial vertebral arteries, basilar artery, and posterior cerebral arteries are patent. A posterior communicating artery is identified on the left. Venous sinuses: As permitted by contrast timing, patent. Anatomic variants: Fetal origin of the left PCA. Review of the MIP images confirms the above findings IMPRESSION: No acute intracranial abnormality. Findings on MRI are not visible on this study. Minimal plaque at the ICA origins.  No measurable stenosis. No proximal intracranial vessel occlusion or significant stenosis. Partially imaged right lower lobe mass may be increased in size. Chronic interstitial changes in the visualized upper lungs with superimposed nonspecific increased patchy density. Electronically Signed   By: Macy Mis M.D.   On: 02/12/2020 09:44   US Carotid Bilateral  Result Date: 02/11/2020 CLINICAL DATA:  74 year old male with stroke-like symptoms EXAM: BILATERAL CAROTID DUPLEX ULTRASOUND  TECHNIQUE: Pearline Cables scale imaging, color Doppler and duplex ultrasound were performed of bilateral carotid and vertebral arteries in the neck. COMPARISON:  None. FINDINGS: Criteria: Quantification of carotid stenosis is based on velocity parameters that correlate the residual internal carotid diameter with NASCET-based stenosis levels, using the diameter of the distal internal carotid lumen as the denominator for stenosis measurement. The following velocity measurements were obtained: RIGHT ICA: 84/27 cm/sec CCA: 91/47 cm/sec SYSTOLIC ICA/CCA RATIO:  1.3 ECA:  81 cm/sec LEFT ICA: 66/19 cm/sec CCA: 82/95 cm/sec SYSTOLIC ICA/CCA RATIO:  1.1 ECA:  87 cm/sec RIGHT CAROTID ARTERY: No significant atherosclerotic plaque or evidence of stenosis in the internal carotid artery. RIGHT VERTEBRAL ARTERY:  Patent with normal antegrade flow. LEFT CAROTID ARTERY: No significant atherosclerotic plaque or evidence of stenosis in the internal carotid artery. LEFT VERTEBRAL ARTERY:  Patent with normal antegrade flow. IMPRESSION:  1. No significant atherosclerotic plaque or evidence of stenosis in the internal carotid artery. 2. Vertebral arteries are patent with normal antegrade flow. Signed, Criselda Peaches, MD, [facility] Electronically Signed   By: Jacqulynn Cadet M.D.   On: 02/11/2020 16:28   DG Chest Port 1 View  Result Date: 02/12/2020 CLINICAL DATA:  74 year old male with fever. History of lung cancer. Positive COVID-19. EXAM: PORTABLE CHEST 1 VIEW COMPARISON:  Chest radiograph dated 02/02/2020. FINDINGS: There is background of emphysema. Bilateral confluent density primarily in the peripheral and subpleural lungs consistent with multifocal pneumonia and in keeping with known COVID-19. Clinical correlation and follow-up recommended. No large pleural effusion or pneumothorax. Stable cardiomediastinal silhouette. Atherosclerotic calcification of the aorta.  Osteopenia with degenerative changes of the spine. No acute osseous pathology. Old healed rib fractures. IMPRESSION: Multifocal pneumonia. Clinical correlation and follow-up recommended. Electronically Signed   By: Anner Crete M.D.   On: 02/12/2020 18:33   ECHOCARDIOGRAM COMPLETE BUBBLE STUDY  Result Date: 02/11/2020    ECHOCARDIOGRAM REPORT   Patient Name:   Duane Santos Date of Exam: 02/11/2020 Medical Rec #:  [id number]       Height:       66.0 in Accession #:    [id number]      Weight:       119.0 lb Date of Birth:  1946-04-17       BSA:          1.604 m Patient Age:    74 years        BP:           130/84 mmHg Patient Gender: M               HR:           96 bpm. Exam Location:  [facility] Procedure: 2D Echo, Cardiac Doppler, Color Doppler and Saline Contrast Bubble            Study Indications:     Stroke 434.91  History:         Patient has no prior history of Echocardiogram examinations.  Sonographer:     Sherrie Sport RDCS (AE) Referring Phys:  [id number] Jennye Boroughs Diagnosing Phys: Nelva Bush MD  Sonographer Comments: Technically difficult study due to poor echo windows, no apical window and no subcostal window. IMPRESSIONS  1. Left ventricular ejection fraction, by estimation, is 65 to 70%. The left ventricle has normal function. Left ventricular endocardial border not optimally defined to evaluate regional wall motion. Left ventricular diastolic function could not be evaluated.  2. Right ventricular systolic function is normal. The right ventricular size is normal. Tricuspid regurgitation signal is inadequate for assessing PA pressure.  3. The mitral valve is grossly normal. No evidence of mitral valve regurgitation.  4. The aortic valve was not well visualized. Aortic valve regurgitation not well-assessed. Aortic stenosis was not well-assessed. FINDINGS  Left Ventricle: Left ventricular ejection fraction, by estimation, is 65 to 70%. The left ventricle has normal function. Left ventricular  endocardial border not optimally defined to evaluate regional wall motion. The left ventricular internal cavity size was normal in size. There is no left ventricular hypertrophy. Left ventricular diastolic function could not be evaluated. Right Ventricle: The right ventricular size is normal. No increase in right ventricular wall thickness. Right ventricular systolic function is normal. Tricuspid regurgitation signal is inadequate for assessing PA pressure. Left Atrium: Left atrial size was not well visualized. Right Atrium:  Right atrial size was not well visualized. Pericardium: The pericardium was not well visualized. Mitral Valve: The mitral valve is grossly normal. No evidence of mitral valve regurgitation. Tricuspid Valve: The tricuspid valve is grossly normal. Tricuspid valve regurgitation is trivial. Aortic Valve: The aortic valve was not well visualized. Aortic valve regurgitation not well-assessed. Aortic stenosis was not well-assessed. Pulmonic Valve: The pulmonic valve was not well visualized. Pulmonic valve regurgitation is not visualized. No evidence of pulmonic stenosis. Aorta: The aortic root is normal in size and structure. Pulmonary Artery: The pulmonary artery is not well seen. Venous: The inferior vena cava was not well visualized. IAS/Shunts: The interatrial septum was not well visualized. Agitated saline contrast was given intravenously to evaluate for intracardiac shunting.  LEFT VENTRICLE PLAX 2D LVIDd:         3.33 cm LVIDs:         2.22 cm LV PW:         0.95 cm LV IVS:        0.83 cm LVOT diam:     2.00 cm LVOT Area:     3.14 cm  LEFT ATRIUM         Index LA diam:    1.80 cm 1.12 cm/m                        PULMONIC VALVE AORTA                 RVOT Peak grad: 3 mmHg Ao Root diam: 3.10 cm   SHUNTS Systemic Diam: 2.00 cm Nelva Bush MD Electronically signed by Nelva Bush MD Signature Date/Time: 02/11/2020/4:44:22 PM    Final     Medications:  I have reviewed the patient's current  medications. Scheduled: . enoxaparin (LOVENOX) injection  1 mg/kg Subcutaneous Q12H  . feeding supplement (ENSURE ENLIVE)  237 mL Oral BID BM  . multivitamin with minerals  1 tablet Oral Daily  . predniSONE  20 mg Oral Q breakfast  . sodium chloride  1 g Oral TID WC  . 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 Patient hyponatremic. MRI of the brain personally reviewed and shows several small areas of restricted diffusion in the cerebellum bilaterally.  Likely acute/subacute infarcts but can not rule out metastasis.  Results discussed with hospitalist and patient restarted on anticoagulation.   Presenting symptoms likely multifactorial in etiology and related to recent COVID infection, hyponatremia and malignancy.    - d/w wife by phone yesterday and in person today - PNA, febrile on antibiotics - symptoms are related to delirium which has improved overnight - discussed taht we can't just d/c neuro checks at night but q6 at night maybe possible - during the day TV on, blinds open, stimulation while letting him rest at night - MRI reviewed and I am not convinced there is  Need for another one as it will not change acute management. Prior MRI with small strokes likely embolic in setting of hypercoagulable state from covid/cancer. There is motion artifact despite that the strokes are visible

## 2020-02-13 NOTE — Progress Notes (Signed)
Pharmacy Antibiotic Note  Duane Santos is a 74 y.o. male admitted on 02/09/2020 with pneumonia.  Pharmacy has been consulted for Vancomycin, Cefepime  dosing.  Plan: Cefepime 2 gm IV Q8H ordered to start on 3/6 @ 2100.  Adjust dose to Vancomycin 500 mg IV Q 12hrs. Goal AUC 400-550. Expected AUC: 448 SCr used: 0.8 Css: 13.4   Height: 5\' 6"  (167.6 cm) Weight: 119 lb 0.8 oz (54 kg) IBW/kg (Calculated) : 63.8  Temp (24hrs), Avg:99.2 F (37.3 C), Min:97.8 F (36.6 C), Max:103 F (39.4 C)  Recent Labs  Lab 02/09/20 1701 02/10/20 0802 02/11/20 0911 02/12/20 0503 02/13/20 0628  WBC 10.6* 10.0  --   --  11.7*  CREATININE 0.90 0.71 0.77 0.77 0.74    Estimated Creatinine Clearance: 63.8 mL/min (by C-G formula based on SCr of 0.74 mg/dL).    No Known Allergies  Antimicrobials this admission:  3/6 vancomycin  >>   3/6 cefepime >>   Microbiology results:  BCx: pending  Thank you for allowing pharmacy to be a part of this patient's care.  Pernell Dupre, PharmD, BCPS Clinical Pharmacist 02/13/2020 10:50 AM

## 2020-02-13 NOTE — Progress Notes (Signed)
Patient is confused and keeps trying to get out of bed. When assessing patient, asked if he was in pain and it states yes. Asked where pain is located and not able to say where. Patient appears slightly agitated. Administered PRN Tylenol. Will continue to monitor to end of shift.

## 2020-02-13 NOTE — Plan of Care (Signed)
  Problem: Education: Goal: Knowledge of General Education information will improve Description: Including pain rating scale, medication(s)/side effects and non-pharmacologic comfort measures 02/13/2020 0315 by Rollen Sox, RN Outcome: Progressing 02/13/2020 0314 by Rollen Sox, RN Outcome: Progressing   Problem: Clinical Measurements: Goal: Ability to maintain clinical measurements within normal limits will improve Outcome: Progressing Goal: Will remain free from infection 02/13/2020 0315 by Rollen Sox, RN Outcome: Progressing 02/13/2020 0314 by Rollen Sox, RN Outcome: Progressing Goal: Diagnostic test results will improve 02/13/2020 0315 by Rollen Sox, RN Outcome: Progressing 02/13/2020 0314 by Rollen Sox, RN Outcome: Progressing Goal: Respiratory complications will improve 02/13/2020 0315 by Rollen Sox, RN Outcome: Progressing 02/13/2020 0314 by Rollen Sox, RN Outcome: Progressing Goal: Cardiovascular complication will be avoided Outcome: Progressing   Problem: Activity: Goal: Risk for activity intolerance will decrease Outcome: Progressing   Problem: Nutrition: Goal: Adequate nutrition will be maintained Outcome: Progressing   Problem: Coping: Goal: Level of anxiety will decrease 02/13/2020 0315 by Rollen Sox, RN Outcome: Progressing 02/13/2020 0314 by Rollen Sox, RN Outcome: Progressing   Problem: Pain Managment: Goal: General experience of comfort will improve 02/13/2020 0315 by Rollen Sox, RN Outcome: Progressing 02/13/2020 0314 by Rollen Sox, RN Outcome: Progressing   Problem: Safety: Goal: Ability to remain free from injury will improve Outcome: Progressing   Problem: Skin Integrity: Goal: Risk for impaired skin integrity will decrease Outcome: Progressing

## 2020-02-14 LAB — CBC WITH DIFFERENTIAL/PLATELET
Abs Immature Granulocytes: 0.16 10*3/uL — ABNORMAL HIGH (ref 0.00–0.07)
Basophils Absolute: 0 10*3/uL (ref 0.0–0.1)
Basophils Relative: 0 %
Eosinophils Absolute: 0 10*3/uL (ref 0.0–0.5)
Eosinophils Relative: 0 %
HCT: 36.5 % — ABNORMAL LOW (ref 39.0–52.0)
Hemoglobin: 12.3 g/dL — ABNORMAL LOW (ref 13.0–17.0)
Immature Granulocytes: 1 %
Lymphocytes Relative: 3 %
Lymphs Abs: 0.3 10*3/uL — ABNORMAL LOW (ref 0.7–4.0)
MCH: 33.2 pg (ref 26.0–34.0)
MCHC: 33.7 g/dL (ref 30.0–36.0)
MCV: 98.4 fL (ref 80.0–100.0)
Monocytes Absolute: 0.8 10*3/uL (ref 0.1–1.0)
Monocytes Relative: 7 %
Neutro Abs: 9.8 10*3/uL — ABNORMAL HIGH (ref 1.7–7.7)
Neutrophils Relative %: 89 %
Platelets: 273 10*3/uL (ref 150–400)
RBC: 3.71 MIL/uL — ABNORMAL LOW (ref 4.22–5.81)
RDW: 14.9 % (ref 11.5–15.5)
WBC: 11.1 10*3/uL — ABNORMAL HIGH (ref 4.0–10.5)
nRBC: 0 % (ref 0.0–0.2)

## 2020-02-14 LAB — BASIC METABOLIC PANEL
Anion gap: 7 (ref 5–15)
BUN: 22 mg/dL (ref 8–23)
CO2: 26 mmol/L (ref 22–32)
Calcium: 8.5 mg/dL — ABNORMAL LOW (ref 8.9–10.3)
Chloride: 100 mmol/L (ref 98–111)
Creatinine, Ser: 0.71 mg/dL (ref 0.61–1.24)
GFR calc Af Amer: >60 mL/min (ref >60)
GFR calc non Af Amer: >60 mL/min (ref >60)
Glucose, Bld: 113 mg/dL — ABNORMAL HIGH (ref 70–99)
Potassium: 3.7 mmol/L (ref 3.5–5.1)
Sodium: 133 mmol/L — ABNORMAL LOW (ref 135–145)

## 2020-02-14 MED ORDER — PIPERACILLIN-TAZOBACTAM 3.375 G IVPB
3.3750 g | Freq: Three times a day (TID) | INTRAVENOUS | Status: DC
  Administered 2020-02-14 – 2020-02-21 (×21): 3.375 g via INTRAVENOUS
  Filled 2020-02-14 (×20): qty 50

## 2020-02-14 NOTE — Plan of Care (Signed)

## 2020-02-14 NOTE — Progress Notes (Signed)
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                 RVOT Peak grad: 3 mmHg Ao Root diam: 3.10 cm   SHUNTS Systemic Diam: 2.00 cm Nelva Bush MD Electronically signed by Nelva Bush MD Signature Date/Time: 02/11/2020/4:44:22 PM    Final      ASSESSMENT/PLAN   Right lower lobe 2cm nodule -patient wishes to move forward with PFT  -s/p MRI brain-abnormal -discussed with neurologist-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 -he will need to be off Eliquis for 7d prior to procedure -he wants to be DNR -Palliative evaluation is inprogress   COVID19 infection - patient is no longer hypoxemic and is able to have saturations over 90% while at rest without using supplemental oxygen -we will order O2 concentrator today   Severe hyponatremia   - need to replete this I suggest salt tabs and repeat BMP  -monitoring Na levels-nephrology is on case appreciate input sodium has improved significantly.  Altered mental status  - likely due to hyponatremia  -patient is still very confused.  -Neurology consultation - discussed case with Dr. Irish Elders- s/p EEG and MRI.  AMS is thought  to be not due to [facility] PT/OT evaluation  -Psychiatric evaluation per family request  Myesthenia Gravis   - continue with prednisone 20mg  po daily  - s/p neuro evaluation - possible mestinon therapy soon    Pulmonary embolism - patient on eliquis -will stop 7d prior to ENB/EBUS      Thank you  for allowing me to participate in the care of this patient.   Patient/Family are satisfied with care plan and all questions have been answered.  This document was prepared using Dragon voice recognition software and may include unintentional dictation errors.     Ottie Glazier, M.D.  Division of [place]

## 2020-02-14 NOTE — Progress Notes (Signed)
Pharmacy Antibiotic Note  Duane Santos is a 74 y.o. male admitted on 02/09/2020 with pneumonia.  Pharmacy has been consulted for Zosyn dosing.  Plan: Vancomycin discontinued per discussion with MD with negative MRSA PCR.  Cefepime discontinued per Pulmonology. Switch to Zosyn 3.375 g IV q8h extended infusion  Height: 5\' 6"  (167.6 cm) Weight: 119 lb 0.8 oz (54 kg) IBW/kg (Calculated) : 63.8  Temp (24hrs), Avg:98.3 F (36.8 C), Min:97.7 F (36.5 C), Max:99.8 F (37.7 C)  Recent Labs  Lab 02/09/20 1701 02/09/20 1701 02/10/20 0802 02/11/20 0911 02/12/20 0503 02/13/20 0628 02/14/20 0818  WBC 10.6*  --  10.0  --   --  11.7* 11.1*  CREATININE 0.90   < > 0.71 0.77 0.77 0.74 0.71   < > = values in this interval not displayed.    Estimated Creatinine Clearance: 63.8 mL/min (by C-G formula based on SCr of 0.71 mg/dL).    No Known Allergies  Antimicrobials this admission: 3/6 vancomycin  >> 3/8 3/6 cefepime >> 3/8 3/8 Zosyn >>  Microbiology results: 3/6 BCx: no growth 3/7 MRSA PCR: negative  Thank you for allowing pharmacy to be a part of this patient's care.  Tawnya Crook, PharmD Clinical Pharmacist 02/14/2020 1:16 PM

## 2020-02-14 NOTE — Care Management Important Message (Signed)
Important Message  Patient Details  Name: Duane Santos MRN: [id number] Date of Birth: December 18, 1945   Medicare Important Message Given:  Yes     Juliann Pulse A Lorene Klimas 02/14/2020, 11:51 AM

## 2020-02-14 NOTE — Progress Notes (Signed)
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 Systemic Diam: 2.00 cm Nelva Bush MD Electronically signed by Nelva Bush MD Signature Date/Time: 02/11/2020/4:44:22 PM    Final     PERFORMANCE STATUS (ECOG) : 3 - Symptomatic, >50% confined to bed  Review of Systems Unable to provide  Physical Exam General: NAD, lying in bed Pulmonary: Unlabored Extremities: no edema, no joint deformities Skin: no rashes Neurological: Weakness, confused  IMPRESSION: Weekend notes reviewed.  Sodium is slowly improving.  Patient remains confused.  Neurology consult noted.  I called and spoke with patient's wife.  She says that she has discussed the case with pulmonary and would like to pursue biopsy when he is medically stable.  Psychiatry consult has been discussed with patient's wife and that is also something she would like to pursue.  Note, she requested the psychiatrist speak with her regarding the consultation so that she may discuss his baseline status.  PLAN: -Continue current prescription treatment -We will follow  Time Total: 15 minutes  Visit consisted of counseling and education dealing  with the complex and emotionally intense issues of symptom management and palliative care in the setting of serious and potentially life-threatening illness.Greater than 50%  of this time  was spent counseling and coordinating care related to the above assessment and plan.  Signed by: Altha Harm, PhD, NP-C
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
## 2020-02-14 NOTE — Progress Notes (Signed)
Progress Note    PLUMER MITTELSTAEDT  BMW:[id number] DOB: [birth year]  DOA: 02/09/2020 PCP: [facility]      Brief Narrative:    Medical records reviewed and are as summarized below:  WINDSOR GOEKEN is an 74 y.o. male with medical history significant for myasthenia gravis, history of COVID-19 pneumonia with hypoxia diagnosed 12/13/2019, hospitalized from 12/21/2019 to 01/03/2020 discharged on oxygen, currently on O2 at 3 L, who also has a history of right lower lobe mass diagnosed during his last hospitalization, last seen by oncology on 01/28/2020, on Eliquis for PE and DVT diagnosed during his last hospitalization, who presented to the emergency room with two weeks off onset confusion around 01/31/20, associated with persistent protracted weakness and falls since his hospitalization.  History is taken from his wife and caregiver due to patient's confusion.    He has a history of myasthenia gravis on prednisone and he was advised by his neurologist not to take any prednisone more than 20 mg.  Reportedly, prior to his COVID-19 infection, he was very independent but since then he has been slowly declining and has required a walker formulation.      Assessment/Plan:   Principal Problem:   Acute metabolic encephalopathy Active Problems:   Multifocal pneumonia   Myasthenia gravis ([place])   Right lower lobe lung mass   Generalized weakness   Hyponatremia   History of COVID-19 pneumonia with hypoxia   Chronic respiratory failure with hypoxia ([facility])   Palliative care encounter   Acute confusional state   Acute confusional state: Intermittent.  Continue supportive care  Acute embolic stroke: 2D echo showed EF estimated 65 to 70% and carotid duplex did not show any significant atherosclerotic plaque or stenosis.  Fever, multifocal pneumonia, probable hospital-acquired: Chest x-ray on 02/12/2020 showed multifocal pneumonia.  MRSA screen was negative so vancomycin has been discontinued.   IV cefepime changed to IV Zosyn because of underlying encephalopathy.  Follow-up blood cultures.  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  Fluid restriction and salt tablets. Monitor BMP.  Right lung mass suspicious for malignancy, sclerotic changes of the right third fourth and fifth ribs suspicious for metastatic disease: Follow-up with pulmonologist.  Plan for biopsy when patient is medically stable.  He recommended that Eliquis be held for 7 days prior to biopsy.  Follow-up with palliative care.  Recent pulmonary embolism and DVT diagnosed on December 23, 2019: Eliquis has been held because of plan for biopsy.  Continue Lovenox bridge for treatment of pulmonary embolism.  Myasthenia gravis: Continue prednisone (his wife said his primary neurologist recommended that the dose should not be below 20 mg daily)  Recent COVID-19 pneumonia/chronic hypoxemic respiratory failure: Continue oxygen via nasal cannula.  Depression: Wife reports that patient is depressed and requested a psychiatry consult.  Generalized weakness/debility: PT and OT.  PT recommends discharge to SNF.  Body mass index is 19.21 kg/m.   Family Communication/Anticipated D/C date and plan/Code Status   DVT prophylaxis: Lovenox Code Status: DNR Family Communication: None today Disposition Plan: Patient is from home.  Plan to discharge home after adequate treatment of pneumonia     Subjective:   No new complaints but patient is confused.  He does not respond appropriately to questions.  He said "whatever you say doc"  Objective:    Vitals:   02/13/20 1509 02/13/20 1632 02/13/20 2307 02/14/20 0737  BP: 129/80 (!) 142/91 (!) 122/58 (!) 142/76  Pulse: (!) 106 100  86 93  Resp: 15 18 18 18   Temp: 99.8 F (37.7 C) 98 F (36.7 C) 97.8 F (36.6 C) 97.7 F (36.5 C)  TempSrc: Oral  Oral Oral  SpO2: 100% 100% 100% 100%  Weight:      Height:        Intake/Output Summary (Last 24  hours) at 02/14/2020 1603 Last data filed at 02/14/2020 1346 Gross per 24 hour  Intake 1940.14 ml  Output 1950 ml  Net -9.86 ml   Filed Weights   02/09/20 1656  Weight: 54 kg    Exam:  GEN: No acute distress SKIN: Abrasion on left arm covered with clean dressing EYES: No pallor or icterus ENT: MMM CV: Regular rate and rhythm PULM: Clear to auscultation bilaterally ABD: soft, ND, NT, +BS CNS: AAO x 2 (person and place), confused, non focal EXT: No edema or tenderness   Data Reviewed:   I have personally reviewed following labs and imaging studies:  Labs: Labs show the following:   Basic Metabolic Panel: Recent Labs  Lab 02/10/20 0802 02/10/20 0802 02/11/20 0911 02/11/20 0911 02/11/20 2152 02/12/20 0503 02/12/20 0503 02/13/20 0628 02/14/20 0818  NA 129*   < > 124*  --  121* 129*  --  134* 133*  K 3.6   < > 3.5   < >  --  3.7   < > 3.8 3.7  CL 90*  --  89*  --   --  94*  --  93* 100  CO2 29  --  26  --   --  28  --  27 26  GLUCOSE 97  --  107*  --   --  110*  --  114* 113*  BUN 11  --  12  --   --  17  --  20 22  CREATININE 0.71  --  0.77  --   --  0.77  --  0.74 0.71  CALCIUM 9.2  --  8.9  --   --  9.0  --  9.1 8.5*   < > = values in this interval not displayed.   GFR Estimated Creatinine Clearance: 63.8 mL/min (by C-G formula based on SCr of 0.71 mg/dL). Liver Function Tests: Recent Labs  Lab 02/09/20 1701  AST 27  ALT 31  ALKPHOS 63  BILITOT 0.6  PROT 7.1  ALBUMIN 3.6   No results for input(s): LIPASE, AMYLASE in the last 168 hours. No results for input(s): AMMONIA in the last 168 hours. Coagulation profile Recent Labs  Lab 02/09/20 1701  INR 1.1    CBC: Recent Labs  Lab 02/09/20 1701 02/10/20 0802 02/13/20 0628 02/14/20 0818  WBC 10.6* 10.0 11.7* 11.1*  NEUTROABS  --   --   --  9.8*  HGB 12.6* 12.8* 12.1* 12.3*  HCT 37.6* 37.7* 35.7* 36.5*  MCV 98.4 98.2 97.8 98.4  PLT 322 279 274 273   Cardiac Enzymes: No results for input(s):  CKTOTAL, CKMB, CKMBINDEX, TROPONINI in the last 168 hours. BNP (last 3 results) No results for input(s): PROBNP in the last 8760 hours. CBG: No results for input(s): GLUCAP in the last 168 hours. D-Dimer: No results for input(s): DDIMER in the last 72 hours. Hgb A1c: No results for input(s): HGBA1C in the last 72 hours. Lipid Profile: No results for input(s): CHOL, HDL, LDLCALC, TRIG, CHOLHDL, LDLDIRECT in the last 72 hours. Thyroid function studies: No results for input(s): TSH, T4TOTAL, T3FREE, THYROIDAB in the last 72 hours.  Invalid input(s): FREET3 Anemia work up: No results for input(s): VITAMINB12, FOLATE, FERRITIN, TIBC, IRON, RETICCTPCT in the last 72 hours. Sepsis Labs: Recent Labs  Lab 02/09/20 1701 02/10/20 0802 02/13/20 0628 02/14/20 0818  WBC 10.6* 10.0 11.7* 11.1*    Microbiology Recent Results (from the past 240 hour(s))  CULTURE, BLOOD (ROUTINE X 2) w Reflex to ID Panel     Status: None (Preliminary result)   Collection Time: 02/12/20  3:59 PM   Specimen: BLOOD  Result Value Ref Range Status   Specimen Description BLOOD RAC  Final   Special Requests   Final    BOTTLES DRAWN AEROBIC AND ANAEROBIC Blood Culture results may not be optimal due to an excessive volume of blood received in culture bottles   Culture   Final    NO GROWTH 2 DAYS Performed at [facility], [address] [place], [place] [address]    Report Status PENDING  Incomplete  CULTURE, BLOOD (ROUTINE X 2) w Reflex to ID Panel     Status: None (Preliminary result)   Collection Time: 02/12/20  4:07 PM   Specimen: BLOOD RIGHT HAND  Result Value Ref Range Status   Specimen Description BLOOD RIGHT HAND [facility]  Final   Special Requests   Final    BOTTLES DRAWN AEROBIC AND ANAEROBIC Blood Culture adequate volume   Culture   Final    NO GROWTH 2 DAYS Performed at [facility], [address] [place], [place] [address]    Report Status PENDING  Incomplete  MRSA PCR  Screening     Status: None   Collection Time: 02/13/20  3:03 PM   Specimen: Nasal Mucosa; Nasopharyngeal  Result Value Ref Range Status   MRSA by PCR NEGATIVE NEGATIVE Final    Comment:        The GeneXpert MRSA Assay (FDA approved for NASAL specimens only), is one component of a comprehensive MRSA colonization surveillance program. It is not intended to diagnose MRSA infection nor to guide or monitor treatment for MRSA infections. Performed at [facility], [address] [place], [place] [address]     Procedures and diagnostic studies:  DG Chest [facility] 1 View  Result Date: 02/12/2020 CLINICAL DATA:  74 year old male with fever. History of lung cancer. Positive COVID-19. EXAM: PORTABLE CHEST 1 VIEW COMPARISON:  Chest radiograph dated 02/02/2020. FINDINGS: There is background of emphysema. Bilateral confluent density primarily in the peripheral and subpleural lungs consistent with multifocal pneumonia and in keeping with known COVID-19. Clinical correlation and follow-up recommended. No large pleural effusion or pneumothorax. Stable cardiomediastinal silhouette. Atherosclerotic calcification of the aorta. Osteopenia with degenerative changes of the spine. No acute osseous pathology. Old healed rib fractures. IMPRESSION: Multifocal pneumonia. Clinical correlation and follow-up recommended. Electronically Signed   By: Anner Crete M.D.   On: 02/12/2020 18:33    Medications:   . enoxaparin (LOVENOX) injection  1 mg/kg Subcutaneous Q12H  . feeding supplement (ENSURE ENLIVE)  237 mL Oral BID BM  . multivitamin with minerals  1 tablet Oral Daily  . predniSONE  20 mg Oral Q breakfast  . sodium chloride  1 g Oral TID WC  . thiamine  100 mg Oral Daily   Continuous Infusions: . piperacillin-tazobactam (ZOSYN)  IV 3.375 g (02/14/20 1432)     LOS: 3 days   Solymar Grace  [facility]     02/14/2020, 4:03 PM

## 2020-02-14 NOTE — Progress Notes (Signed)
Physical Therapy Treatment Patient Details Name: Duane Santos MRN: [id number] DOB: 04/06/1946 Today's Date: 02/14/2020    History of Present Illness presented to ER secondary to AMS, progressive weakness and repeated falls; admitted for management of acute metabolic encephalopathy.  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 PMH significant for MG, covid-19 infection (1/21) and newly-diagnosed R lung cancer.    PT Comments    Pt is making good progress towards goals, however continues to be limited by confusion. Needs +2 for safety due to decreased safety awareness. Able to ambulate room distances with RW, needs heavy tactile cues for turns and obstacle avoidance. HR increases sharply with activity. Limited participation in there-ex, due to cognition. Will continue to progress as able.   Follow Up Recommendations  SNF     Equipment Recommendations  Rolling walker with 5" wheels;3in1 (PT)    Recommendations for Other Services       Precautions / Restrictions Precautions Precautions: Fall Restrictions Weight Bearing Restrictions: No    Mobility  Bed Mobility Overal bed mobility: Needs Assistance Bed Mobility: Supine to Sit;Sit to Supine     Supine to sit: Min guard;+2 for safety/equipment     General bed mobility comments: Able to follow commands and performs quick transfer to EOB. +2 for safety. No LOB noted  Transfers Overall transfer level: Needs assistance Equipment used: Rolling walker (2 wheeled) Transfers: Sit to/from Stand Sit to Stand: Min assist;+2 safety/equipment         General transfer comment: min assist and cues for safety. All mobility performed on RA. Needs hand over hand for sequencing  Ambulation/Gait Ambulation/Gait assistance: Min assist;Mod assist;+2 physical assistance Gait Distance (Feet): 50 Feet Assistive device: Rolling walker (2 wheeled) Gait Pattern/deviations: Step-to pattern     General  Gait Details: mod assist needed to manage obstacles and turn. +2 for safety/equipment. Pt drags L foot and stumbles on occasion. Unable to self correct. HR increases to 131bpm with exertion and O2 at 82% on RA. 3L of O2 reapplied with O2 sats increasing to 91%.   Stairs             Wheelchair Mobility    Modified Rankin (Stroke Patients Only)       Balance Overall balance assessment: Needs assistance Sitting-balance support: No upper extremity supported;Feet supported Sitting balance-Leahy Scale: Fair       Standing balance-Leahy Scale: Fair Standing balance comment: uses RW                            Cognition Arousal/Alertness: Awake/alert Behavior During Therapy: Flat affect Overall Cognitive Status: No family/caregiver present to determine baseline cognitive functioning                                 General Comments: oriented to self only. Generally responds to gestures, has trouble following verbal commands. Poor insight and safety awareness      Exercises Other Exercises Other Exercises: Supine ther-ex performed with heavy cues for sequencing. Ther-ex performed included heel slides, SLRs, and hip abd/add. All ther-ex performed x 5-10 reps with difficulty following commands.    General Comments        Pertinent Vitals/Pain Pain Assessment: No/denies pain    Home Living  Prior Function            PT Goals (current goals can now be found in the care plan section) Acute Rehab PT Goals Patient Stated Goal: agreeable to session PT Goal Formulation: With patient Time For Goal Achievement: 02/25/20 Potential to Achieve Goals: Fair Progress towards PT goals: Progressing toward goals    Frequency    Min 2X/week      PT Plan Current plan remains appropriate    Co-evaluation              AM-PAC PT "6 Clicks" Mobility   Outcome Measure  Help needed turning from your back to your side  while in a flat bed without using bedrails?: None Help needed moving from lying on your back to sitting on the side of a flat bed without using bedrails?: A Little Help needed moving to and from a bed to a chair (including a wheelchair)?: A Little Help needed standing up from a chair using your arms (e.g., wheelchair or bedside chair)?: A Little Help needed to walk in hospital room?: A Lot Help needed climbing 3-5 steps with a railing? : A Lot 6 Click Score: 17    End of Session Equipment Utilized During Treatment: Gait belt;Oxygen Activity Tolerance: Patient tolerated treatment well Patient left: in chair;with chair alarm set(communicated to RN for mobility assistance) Nurse Communication: Mobility status PT Visit Diagnosis: Muscle weakness (generalized) (M62.81);Other abnormalities of gait and mobility (R26.89);Unsteadiness on feet (R26.81);Other symptoms and signs involving the nervous system (R29.898);Repeated falls (R29.6)     Time: 0340-3524 PT Time Calculation (min) (ACUTE ONLY): 25 min  Charges:  $Gait Training: 8-22 mins $Therapeutic Exercise: 8-22 mins                     Greggory Stallion, PT, DPT [phone]    Duane Santos 02/14/2020, 11:44 AM

## 2020-02-14 NOTE — Progress Notes (Signed)
[facility]  ROUNDING NOTE   Subjective:  Patient awake this a.m. but remains confused. Serum sodium relatively stable at 133.   Objective:  Vital signs in last 24 hours:  Temp:  [97.7 F (36.5 C)-99.8 F (37.7 C)] 97.7 F (36.5 C) (03/08 0737) Pulse Rate:  [86-106] 93 (03/08 0737) Resp:  [15-18] 18 (03/08 0737) BP: (122-142)/(58-91) 142/76 (03/08 0737) SpO2:  [100 %] 100 % (03/08 0737)  Weight change:  Filed Weights   02/09/20 1656  Weight: 54 kg    Intake/Output: I/O last 3 completed shifts: In: 1280.1 [P.O.:240; IV Piggyback:1040.1] Out: 9983 [Urine:3950]   Intake/Output this shift:  Total I/O In: 1110.1 [P.O.:240; IV Piggyback:870.1] Out: -   Physical Exam: General: No acute distress  Head: Normocephalic, atraumatic. Moist oral mucosal membranes  Eyes: Anicteric  Neck: Supple, trachea midline  Lungs:  Clear to auscultation, normal effort  Heart: S1S2 no rubs  Abdomen:  Soft, nontender, bowel sounds present  Extremities: No peripheral edema.  Neurologic: Awake, alert, but confused  Skin: No lesions       Basic Metabolic Panel: Recent Labs  Lab 02/10/20 0802 02/10/20 0802 02/11/20 0911 02/11/20 0911 02/11/20 2152 02/12/20 0503 02/13/20 0628 02/14/20 0818  NA 129*   < > 124*  --  121* 129* 134* 133*  K 3.6  --  3.5  --   --  3.7 3.8 3.7  CL 90*  --  89*  --   --  94* 93* 100  CO2 29  --  26  --   --  28 27 26   GLUCOSE 97  --  107*  --   --  110* 114* 113*  BUN 11  --  12  --   --  17 20 22   CREATININE 0.71  --  0.77  --   --  0.77 0.74 0.71  CALCIUM 9.2   < > 8.9   < >  --  9.0 9.1 8.5*   < > = values in this interval not displayed.    Liver Function Tests: Recent Labs  Lab 02/09/20 1701  AST 27  ALT 31  ALKPHOS 63  BILITOT 0.6  PROT 7.1  ALBUMIN 3.6   No results for input(s): LIPASE, AMYLASE in the last 168 hours. No results for input(s): AMMONIA in the last 168 hours.  CBC: Recent Labs  Lab 02/09/20 1701  02/10/20 0802 02/13/20 0628 02/14/20 0818  WBC 10.6* 10.0 11.7* 11.1*  NEUTROABS  --   --   --  9.8*  HGB 12.6* 12.8* 12.1* 12.3*  HCT 37.6* 37.7* 35.7* 36.5*  MCV 98.4 98.2 97.8 98.4  PLT 322 279 274 273    Cardiac Enzymes: No results for input(s): CKTOTAL, CKMB, CKMBINDEX, TROPONINI in the last 168 hours.  BNP: Invalid input(s): POCBNP  CBG: No results for input(s): GLUCAP in the last 168 hours.  Microbiology: Results for orders placed or performed during the hospital encounter of 02/09/20  CULTURE, BLOOD (ROUTINE X 2) w Reflex to ID Panel     Status: None (Preliminary result)   Collection Time: 02/12/20  3:59 PM   Specimen: BLOOD  Result Value Ref Range Status   Specimen Description BLOOD RAC  Final   Special Requests   Final    BOTTLES DRAWN AEROBIC AND ANAEROBIC Blood Culture results may not be optimal due to an excessive volume of blood received in culture bottles   Culture   Final    NO GROWTH 2 DAYS  Performed at [facility], [address] [place], [place] [address]    Report Status PENDING  Incomplete  CULTURE, BLOOD (ROUTINE X 2) w Reflex to ID Panel     Status: None (Preliminary result)   Collection Time: 02/12/20  4:07 PM   Specimen: BLOOD RIGHT HAND  Result Value Ref Range Status   Specimen Description BLOOD RIGHT HAND [facility]  Final   Special Requests   Final    BOTTLES DRAWN AEROBIC AND ANAEROBIC Blood Culture adequate volume   Culture   Final    NO GROWTH 2 DAYS Performed at [facility], [address], [place], [place] [address]    Report Status PENDING  Incomplete  MRSA PCR Screening     Status: None   Collection Time: 02/13/20  3:03 PM   Specimen: Nasal Mucosa; Nasopharyngeal  Result Value Ref Range Status   MRSA by PCR NEGATIVE NEGATIVE Final    Comment:        The GeneXpert MRSA Assay (FDA approved for NASAL specimens only), is one component of a comprehensive MRSA colonization surveillance program. It is not intended  to diagnose MRSA infection nor to guide or monitor treatment for MRSA infections. Performed at [facility], [address] [place], [place] [address]     Coagulation Studies: No results for input(s): LABPROT, INR in the last 72 hours.  Urinalysis: Recent Labs    02/12/20 2028  COLORURINE YELLOW*  LABSPEC 1.023  PHURINE 6.0  GLUCOSEU NEGATIVE  HGBUR NEGATIVE  BILIRUBINUR NEGATIVE  KETONESUR 5*  PROTEINUR NEGATIVE  NITRITE NEGATIVE  LEUKOCYTESUR NEGATIVE      Imaging: DG Chest Port 1 View  Result Date: 02/12/2020 CLINICAL DATA:  74 year old male with fever. History of lung cancer. Positive COVID-19. EXAM: PORTABLE CHEST 1 VIEW COMPARISON:  Chest radiograph dated 02/02/2020. FINDINGS: There is background of emphysema. Bilateral confluent density primarily in the peripheral and subpleural lungs consistent with multifocal pneumonia and in keeping with known COVID-19. Clinical correlation and follow-up recommended. No large pleural effusion or pneumothorax. Stable cardiomediastinal silhouette. Atherosclerotic calcification of the aorta. Osteopenia with degenerative changes of the spine. No acute osseous pathology. Old healed rib fractures. IMPRESSION: Multifocal pneumonia. Clinical correlation and follow-up recommended. Electronically Signed   By: Anner Crete M.D.   On: 02/12/2020 18:33     Medications:   . piperacillin-tazobactam (ZOSYN)  IV     . enoxaparin (LOVENOX) injection  1 mg/kg Subcutaneous Q12H  . feeding supplement (ENSURE ENLIVE)  237 mL Oral BID BM  . multivitamin with minerals  1 tablet Oral Daily  . predniSONE  20 mg Oral Q breakfast  . sodium chloride  1 g Oral TID WC  . thiamine  100 mg Oral Daily   acetaminophen **OR** acetaminophen, ondansetron **OR** ondansetron (ZOFRAN) IV, senna-docusate  Assessment/ Plan:  74 y.o. male with myasthenia gravis, lung cancer, osteoporosis, acute left DVT, PE after Covid pneumonia hospitalization January  2021, was admitted on 02/09/2020 with Hyponatremia [E87.1] Altered mental status, unspecified altered mental status type [R41.82] Acute confusional state [F05] .  #Hyponatremia.  Hyponatremia secondary to SIADH.  Serum sodium currently 133.  Continue fluid restriction of 1.2 L/day and also maintain the patient on sodium chloride 1 g p.o. 3 times daily.  #Altered mental status.  Patient still having altered mental status.  CVA versus CNS metastatic disease.  Palliative care has evaluated the patient.   LOS: 3 Alannah Averhart 3/8/20211:39 PM

## 2020-02-15 LAB — B. BURGDORFI ANTIBODIES: B burgdorferi Ab IgG+IgM: <0.91 {ISR} (ref 0.00–0.90)

## 2020-02-15 NOTE — Progress Notes (Signed)
Physical Therapy Treatment Patient Details Name: Duane Santos MRN: [id number] DOB: Jan 19, 1946 Today's Date: 02/15/2020    History of Present Illness presented to ER secondary to AMS, progressive weakness and repeated falls; admitted for management of acute metabolic encephalopathy.  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 PMH significant for MG, covid-19 infection (1/21) and newly-diagnosed R lung cancer.    PT Comments    Pt is making limited progress towards goals secondary to cognition. Pt waxes/wanes with periods of calmness/agitation. Pt agreeable to ambulate, then doesn't want to sit in recliner "wants to roll it out in the hallway". Unable to reason with pt easily. Pt also needs heavy manual cues and constant hands on assist for mobility efforts with 2 people for safety. All mobility performed on RA with sats WNL. HR increases with exertion. Currently isn't safe to return home, recommending SNF.   Follow Up Recommendations  SNF     Equipment Recommendations  Rolling walker with 5" wheels;3in1 (PT)    Recommendations for Other Services       Precautions / Restrictions Precautions Precautions: Fall Restrictions Weight Bearing Restrictions: No    Mobility  Bed Mobility Overal bed mobility: Needs Assistance Bed Mobility: Supine to Sit     Supine to sit: Min guard;HOB elevated     General bed mobility comments: not performed as pt received in reclienr  Transfers Overall transfer level: Needs assistance Equipment used: Rolling walker (2 wheeled) Transfers: Sit to/from [facility] Sit to Stand: Min assist;+2 physical assistance Stand pivot transfers: Min assist;+2 safety/equipment       General transfer comment: min assist required as pt impulsive. Able to stand with upright posture for donning diaper. O2 sats at 97% on RA with HR at 122bpm.  Ambulation/Gait Ambulation/Gait assistance: Min assist;+2  physical assistance Gait Distance (Feet): 90 Feet Assistive device: Rolling walker (2 wheeled) Gait Pattern/deviations: Step-to pattern     General Gait Details: ambulated using RW with +2 for safety. Pt with improved fluid gait pattern this date. Needs manual cues for turning around using RW and had 1 occasion of dragging L foot. Post ambulation, O2 sats at 92% and HR at 145bpm.   Stairs             Wheelchair Mobility    Modified Rankin (Stroke Patients Only)       Balance Overall balance assessment: Needs assistance Sitting-balance support: No upper extremity supported;Feet supported Sitting balance-Leahy Scale: Good   Postural control: Posterior lean Standing balance support: Bilateral upper extremity supported Standing balance-Leahy Scale: Fair Standing balance comment: uses RW                            Cognition Arousal/Alertness: Awake/alert Behavior During Therapy: Flat affect Overall Cognitive Status: Impaired/Different from baseline Area of Impairment: Orientation;Attention;Memory;Following commands;Safety/judgement;Awareness;Problem solving                 Orientation Level: Person Current Attention Level: Divided Memory: Decreased short-term memory;Decreased recall of precautions Following Commands: Follows one step commands inconsistently Safety/Judgement: Decreased awareness of safety;Decreased awareness of deficits   Problem Solving: Slow processing;Decreased initiation;Difficulty sequencing;Requires verbal cues;Requires tactile cues General Comments: Pt somewhat agitated this date and doesn't want to sit in recliner, however doesn't want to sit in bed either. Unable to reason with pt.      Exercises Other Exercises Other Exercises: Educated pt's wife on goals of OT Other Exercises: Pt  performed toileting x 4 with MIN A x 2 for safety during functional transfers and MAX-TOTAL A for pericare.  Completed functional mobility using  RW wit MIN A x 2. Other Exercises: attempted seated ther-ex, however unable to fully perform due to confusion. Attempted LAQ, alt marching and arm raises. Pt able to perform inconsistently 3-5 reps.    General Comments        Pertinent Vitals/Pain Pain Assessment: No/denies pain    Home Living                      Prior Function            PT Goals (current goals can now be found in the care plan section) Acute Rehab PT Goals Patient Stated Goal: agreeable to session PT Goal Formulation: With patient Time For Goal Achievement: 02/25/20 Potential to Achieve Goals: Fair Progress towards PT goals: Progressing toward goals    Frequency    Min 2X/week      PT Plan Current plan remains appropriate    Co-evaluation              AM-PAC PT "6 Clicks" Mobility   Outcome Measure  Help needed turning from your back to your side while in a flat bed without using bedrails?: None Help needed moving from lying on your back to sitting on the side of a flat bed without using bedrails?: A Little Help needed moving to and from a bed to a chair (including a wheelchair)?: A Little Help needed standing up from a chair using your arms (e.g., wheelchair or bedside chair)?: A Little Help needed to walk in hospital room?: A Little Help needed climbing 3-5 steps with a railing? : A Lot 6 Click Score: 18    End of Session Equipment Utilized During Treatment: Gait belt Activity Tolerance: Patient tolerated treatment well Patient left: in chair;with chair alarm set;with nursing/sitter in room Nurse Communication: Mobility status PT Visit Diagnosis: Muscle weakness (generalized) (M62.81);Other abnormalities of gait and mobility (R26.89);Unsteadiness on feet (R26.81);Other symptoms and signs involving the nervous system (R29.898);Repeated falls (R29.6)     Time: 9233-0076 PT Time Calculation (min) (ACUTE ONLY): 18 min  Charges:  $Gait Training: 8-22 mins                      Greggory Stallion, [place], DPT [phone]    Greer Koeppen 02/15/2020, 3:04 PM

## 2020-02-15 NOTE — Progress Notes (Signed)
Occupational Therapy Treatment Patient Details Name: Duane Santos MRN: [id number] DOB: Jul 09, 1946 Today's Date: 02/15/2020    History of present illness presented to ER secondary to AMS, progressive weakness and repeated falls; admitted for management of acute metabolic encephalopathy.  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 PMH significant for MG, covid-19 infection (1/21) and newly-diagnosed R lung cancer.   OT comments  Patient seen this date for OT session.  Upon arrival, pt supine in bed.  Required encouragement to sit at EOB and go to recliner.  Patient reports needing to use restroom.  Patient agreeable for functional mobility to restroom, but patient had BM upon standing. Required MAX-TOTAL A for clean up, changing gown, and personal hygiene.  Completed toileting x 4 using BSC with MAX A for pericare.  MIN A X 2 needed for functional transfers due to poor impulse control, confusion, and deficits in sequencing, problem solving and following directions.  Requires MAX verbal/tactile/visual cues for sequencing of all tasks.  TOTAL A to don/doff socks while seated.  TOTAL A to don/doff gowns while seated.  Patient able to perform hand washing while seated with MAX A.  Completed functional mobility using RW with MIN A x 2 demonstrating poor R/L discrimination and inconsistent ability to follow one step instructions.  Patient's wife in room for duration of session.  Noted she was feeding him at end of session due to decreased task initiation.  Patient would benefit from continued skilled occupational therapy to address a multitude of performance components to increase independence and reduce caregiver burden.  Based on today's performance, follow up recommendation remains appropriate.       Follow Up Recommendations  SNF    Equipment Recommendations  Other (comment)(Defer to next level of care)    Recommendations for Other Services       Precautions / Restrictions Precautions Precautions: Fall Restrictions Weight Bearing Restrictions: No       Mobility Bed Mobility Overal bed mobility: Needs Assistance Bed Mobility: Supine to Sit     Supine to sit: Min guard;HOB elevated     General bed mobility comments: Required extensive encouragement  Transfers Overall transfer level: Needs assistance Equipment used: Rolling walker (2 wheeled) Transfers: Sit to/from [facility] Sit to Stand: Min assist;+2 safety/equipment Stand pivot transfers: Min assist;+2 safety/equipment       General transfer comment: Required extensive (verbal/visual/tactile) cuing for sequencing, body mechanics, R/L discrimination and safety    Balance Overall balance assessment: Needs assistance       Postural control: Posterior lean Standing balance support: Bilateral upper extremity supported Standing balance-Leahy Scale: Poor Standing balance comment: uses RW                           ADL either performed or assessed with clinical judgement   ADL Overall ADL's : Needs assistance/impaired   Eating/Feeding Details (indicate cue type and reason): This date, wife was noted to be feeding patient Grooming: Wash/dry hands;Sitting;Maximal assistance Grooming Details (indicate cue type and reason): Poor ability to to initiate and complete task.                 Toilet Transfer: Minimal assistance;+2 for safety/equipment Toilet Transfer Details (indicate cue type and reason): MIN physical assist needed, but requires MAX verbal/tactile/visual cues for sequencing and safety Toileting- Clothing Manipulation and Hygiene: Maximal assistance;Sit to/from stand Toileting - Clothing Manipulation Details (indicate cue type and reason): TOTAL  A needed for pericare this date.  Patient noted to be impulsive and reaching for items not in use during toileting.     Functional mobility during ADLs: Minimal assistance;+2 for  safety/equipment;Rolling walker General ADL Comments: Requires maximal cues to maintain attention to task, appropriate concentration and safety awareness. Noted impulsivity. Noted with tremors.     Vision   Vision Assessment?: No apparent visual deficits   Perception     Praxis      Cognition Arousal/Alertness: Awake/alert Behavior During Therapy: Flat affect Overall Cognitive Status: Impaired/Different from baseline Area of Impairment: Orientation;Attention;Memory;Following commands;Safety/judgement;Awareness;Problem solving                 Orientation Level: Person Current Attention Level: Divided Memory: Decreased short-term memory;Decreased recall of precautions Following Commands: Follows one step commands inconsistently Safety/Judgement: Decreased awareness of safety;Decreased awareness of deficits   Problem Solving: Slow processing;Decreased initiation;Difficulty sequencing;Requires verbal cues;Requires tactile cues General Comments: Demonstrating decreased cognition today and frequently states "yes, ma'am" and will not initiate task for follow through with instructions        Exercises Other Exercises Other Exercises: Educated pt's wife on goals of OT Other Exercises: Pt performed toileting x 4 with MIN A x 2 for safety during functional transfers and MAX-TOTAL A for pericare.  Completed functional mobility using RW wit MIN A x 2. Other Exercises: Completed bed mobility with CGA.   Shoulder Instructions       General Comments      Pertinent Vitals/ Pain       Pain Assessment: No/denies pain  Home Living                                          Prior Functioning/Environment              Frequency  Min 2X/week        Progress Toward Goals  OT Goals(current goals can now be found in the care plan section)  Progress towards OT goals: OT to reassess next treatment     Plan Discharge plan remains appropriate;Frequency remains  appropriate    Co-evaluation                 AM-PAC OT "6 Clicks" Daily Activity     Outcome Measure   Help from another person eating meals?: A Lot Help from another person taking care of personal grooming?: A Lot Help from another person toileting, which includes using toliet, bedpan, or urinal?: A Lot Help from another person bathing (including washing, rinsing, drying)?: A Lot Help from another person to put on and taking off regular upper body clothing?: A Lot Help from another person to put on and taking off regular lower body clothing?: A Lot 6 Click Score: 12    End of Session Equipment Utilized During Treatment: Gait belt;Rolling walker  OT Visit Diagnosis: Muscle weakness (generalized) (M62.81);Unsteadiness on feet (R26.81)   Activity Tolerance Other (comment)(Limited due to confusion and poor ability to accept direction)   Patient Left in chair;with call bell/phone within reach;with chair alarm set;with family/visitor present   Nurse Communication Other (comment)(Informed tech of BMs)        Time: 7564-3329 OT Time Calculation (min): 70 min  Charges: OT General Charges $OT Visit: 1 Visit OT Treatments $Self Care/Home Management : 68-82 mins  Baldomero Lamy, MS, OTR/L 02/15/20, 1:08 PM

## 2020-02-15 NOTE — Progress Notes (Signed)
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   SHUNTS Systemic Diam: 2.00 cm Nelva Bush MD Electronically signed by Nelva Bush MD Signature Date/Time: 02/11/2020/4:44:22 PM    Final      ASSESSMENT/PLAN   Right lower lobe 2cm nodule -patient wishes to move forward with PFT  -s/p MRI brain-abnormal -discussed with neurologist-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 -he will need to be off Eliquis for 7d prior to procedure -he wants to be DNR -Palliative evaluation is inprogress   COVID19 infection - patient is no longer hypoxemic and is able to have saturations over 90% while at rest without using supplemental oxygen -we will order O2 concentrator today   Severe hyponatremia   - need to replete this I suggest salt tabs and repeat BMP  -monitoring Na levels-nephrology is on case appreciate input sodium has improved significantly.  Altered mental status  - likely due to hyponatremia  -patient is still very confused.  -Neurology consultation - discussed case with Dr. Irish Elders- s/p EEG and MRI.  AMS is thought to be not due to [facility] PT/OT evaluation  -Psychiatric evaluation per  family request  Myesthenia Gravis   - continue with prednisone 20mg  po daily  - s/p neuro evaluation - possible mestinon therapy soon    Pulmonary embolism - patient on eliquis- will hold for now. He is on Lovenox  now, we will stop this on Wednesday 02/16/20 -will stop 7d prior to ENB/EBUS      Thank you for allowing me to participate in the care of this patient.   Patient/Family are satisfied with care plan and all questions have been answered.  This document was prepared using Dragon voice recognition software and may include unintentional dictation errors.     Ottie Glazier, M.D.  Division of [place]

## 2020-02-15 NOTE — Progress Notes (Signed)
Progress Note    Duane Santos  TDD:[id number] DOB: August 15, 1946  DOA: 02/09/2020 PCP: [place]      Brief Narrative:    Medical records reviewed and are as summarized below:  Duane Santos is an 74 y.o. male with medical history significant for myasthenia gravis, history of COVID-19 pneumonia with hypoxia diagnosed 12/13/2019, hospitalized from 12/21/2019 to 01/03/2020 discharged on oxygen, currently on O2 at 3 L, who also has a history of right lower lobe mass diagnosed during his last hospitalization, last seen by oncology on 01/28/2020, on Eliquis for PE and DVT diagnosed during his last hospitalization, who presented to the emergency room with two weeks off onset confusion around 01/31/20, associated with persistent protracted weakness and falls since his hospitalization.  History is taken from his wife and caregiver due to patient's confusion.    He has a history of myasthenia gravis on prednisone and he was advised by his neurologist not to take any prednisone more than 20 mg.  Reportedly, prior to his COVID-19 infection, he was very independent but since then he has been slowly declining and has required a walker formulation.      Assessment/Plan:   Principal Problem:   Acute confusional state Active Problems:   Multifocal pneumonia   Myasthenia gravis ([place])   Right lower lobe lung mass   Generalized weakness   Hyponatremia   History of COVID-19 pneumonia with hypoxia   Acute metabolic encephalopathy   Chronic respiratory failure with hypoxia (HCC)   Palliative care encounter   Acute confusional state: Intermittent.  Continue supportive care  Acute embolic stroke: 2D echo showed EF estimated 65 to 70% and carotid duplex did not show any significant atherosclerotic plaque or stenosis.  Recurrent fever, multifocal pneumonia, probable hospital-acquired, immunocompromised state: Consulted infectious disease specialist to assist with management.  Chest x-ray on  02/12/2020 showed multifocal pneumonia.  MRSA screen was negative so vancomycin has been discontinued.  IV cefepime changed to IV Zosyn because of underlying encephalopathy.  Follow-up blood cultures.  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  Continue fluid restriction and salt tablets. Monitor BMP.  Right lung mass suspicious for malignancy, sclerotic changes of the right third fourth and fifth ribs suspicious for metastatic disease: Follow-up with pulmonologist.  Plan for biopsy when patient is medically stable.  He recommended that Eliquis be held for 7 days prior to biopsy.  Follow-up with palliative care.  Recent pulmonary embolism and DVT diagnosed on December 23, 2019: Eliquis has been held because of plan for biopsy.  Continue Lovenox bridge for treatment of pulmonary embolism.  Myasthenia gravis: Continue prednisone (his wife said his primary neurologist recommended that the dose should not be below 20 mg daily)  Recent COVID-19 pneumonia/chronic hypoxemic respiratory failure: Continue oxygen via nasal cannula.  Depression: Wife reports that patient is depressed and requested a psychiatry consult.  Generalized weakness/debility: PT and OT.  PT and OT recommend discharge to SNF.  Body mass index is 19.21 kg/m.   Family Communication/Anticipated D/C date and plan/Code Status   DVT prophylaxis: Lovenox Code Status: DNR Family Communication: None today Disposition Plan: Patient is from home.  Plan to discharge home after adequate treatment of pneumonia     Subjective:   Patient does not report any complaints.  Patient had a fever with temperature of 101.3 this morning   Objective:    Vitals:   02/14/20 2037 02/14/20 2308 02/15/20 0744 02/15/20 1532  BP: 109/85  110/73 (!) 143/68 135/78  Pulse: (!) 112 81 (!) 106 (!) 119  Resp: 16 16 17 19   Temp: (!) 101.3 F (38.5 C) (!) 97.4 F (36.3 C) 99.2 F (37.3 C) (!) 100.6 F (38.1 C)  TempSrc:  Oral Axillary Oral Oral  SpO2: 100% 99% 99% 98%  Weight:      Height:        Intake/Output Summary (Last 24 hours) at 02/15/2020 1617 Last data filed at 02/15/2020 1358 Gross per 24 hour  Intake 190 ml  Output --  Net 190 ml   Filed Weights   02/09/20 1656  Weight: 54 kg    Exam:  GEN: No acute distress SKIN: Abrasion on left arm covered with clean dressing EYES: EOMI ENT: MMM CV: Regular rate and rhythm PULM: No wheezing or rales heard ABD: soft, ND, NT, +BS CNS: AAO x 1 (person only), confused, non focal EXT: No edema or tenderness   Data Reviewed:   I have personally reviewed following labs and imaging studies:  Labs: Labs show the following:   Basic Metabolic Panel: Recent Labs  Lab 02/10/20 0802 02/10/20 0802 02/11/20 0911 02/11/20 0911 02/11/20 2152 02/12/20 0503 02/12/20 0503 02/13/20 0628 02/14/20 0818  NA 129*   < > 124*  --  121* 129*  --  134* 133*  K 3.6   < > 3.5   < >  --  3.7   < > 3.8 3.7  CL 90*  --  89*  --   --  94*  --  93* 100  CO2 29  --  26  --   --  28  --  27 26  GLUCOSE 97  --  107*  --   --  110*  --  114* 113*  BUN 11  --  12  --   --  17  --  20 22  CREATININE 0.71  --  0.77  --   --  0.77  --  0.74 0.71  CALCIUM 9.2  --  8.9  --   --  9.0  --  9.1 8.5*   < > = values in this interval not displayed.   GFR Estimated Creatinine Clearance: 63.8 mL/min (by C-G formula based on SCr of 0.71 mg/dL). Liver Function Tests: Recent Labs  Lab 02/09/20 1701  AST 27  ALT 31  ALKPHOS 63  BILITOT 0.6  PROT 7.1  ALBUMIN 3.6   No results for input(s): LIPASE, AMYLASE in the last 168 hours. No results for input(s): AMMONIA in the last 168 hours. Coagulation profile Recent Labs  Lab 02/09/20 1701  INR 1.1    CBC: Recent Labs  Lab 02/09/20 1701 02/10/20 0802 02/13/20 0628 02/14/20 0818  WBC 10.6* 10.0 11.7* 11.1*  NEUTROABS  --   --   --  9.8*  HGB 12.6* 12.8* 12.1* 12.3*  HCT 37.6* 37.7* 35.7* 36.5*  MCV 98.4 98.2 97.8  98.4  PLT 322 279 274 273   Cardiac Enzymes: No results for input(s): CKTOTAL, CKMB, CKMBINDEX, TROPONINI in the last 168 hours. BNP (last 3 results) No results for input(s): PROBNP in the last 8760 hours. CBG: No results for input(s): GLUCAP in the last 168 hours. D-Dimer: No results for input(s): DDIMER in the last 72 hours. Hgb A1c: No results for input(s): HGBA1C in the last 72 hours. Lipid Profile: No results for input(s): CHOL, HDL, LDLCALC, TRIG, CHOLHDL, LDLDIRECT in the last 72 hours. Thyroid function studies: No results for input(s):  TSH, T4TOTAL, T3FREE, THYROIDAB in the last 72 hours.  Invalid input(s): FREET3 Anemia work up: No results for input(s): VITAMINB12, FOLATE, FERRITIN, TIBC, IRON, RETICCTPCT in the last 72 hours. Sepsis Labs: Recent Labs  Lab 02/09/20 1701 02/10/20 0802 02/13/20 0628 02/14/20 0818  WBC 10.6* 10.0 11.7* 11.1*    Microbiology Recent Results (from the past 240 hour(s))  CULTURE, BLOOD (ROUTINE X 2) w Reflex to ID Panel     Status: None (Preliminary result)   Collection Time: 02/12/20  3:59 PM   Specimen: BLOOD  Result Value Ref Range Status   Specimen Description BLOOD RAC  Final   Special Requests   Final    BOTTLES DRAWN AEROBIC AND ANAEROBIC Blood Culture results may not be optimal due to an excessive volume of blood received in culture bottles   Culture   Final    NO GROWTH 3 DAYS Performed at [facility], [address] [place], [place] [address]    Report Status PENDING  Incomplete  CULTURE, BLOOD (ROUTINE X 2) w Reflex to ID Panel     Status: None (Preliminary result)   Collection Time: 02/12/20  4:07 PM   Specimen: BLOOD RIGHT HAND  Result Value Ref Range Status   Specimen Description BLOOD RIGHT HAND [facility]  Final   Special Requests   Final    BOTTLES DRAWN AEROBIC AND ANAEROBIC Blood Culture adequate volume   Culture   Final    NO GROWTH 3 DAYS Performed at [facility], [address]  [place], [place] [address]    Report Status PENDING  Incomplete  MRSA PCR Screening     Status: None   Collection Time: 02/13/20  3:03 PM   Specimen: Nasal Mucosa; Nasopharyngeal  Result Value Ref Range Status   MRSA by PCR NEGATIVE NEGATIVE Final    Comment:        The GeneXpert MRSA Assay (FDA approved for NASAL specimens only), is one component of a comprehensive MRSA colonization surveillance program. It is not intended to diagnose MRSA infection nor to guide or monitor treatment for MRSA infections. Performed at [facility], [address] [place], [place] [address]     Procedures and diagnostic studies:  No results found.  Medications:   . enoxaparin (LOVENOX) injection  1 mg/kg Subcutaneous Q12H  . feeding supplement (ENSURE ENLIVE)  237 mL Oral BID BM  . multivitamin with minerals  1 tablet Oral Daily  . predniSONE  20 mg Oral Q breakfast  . sodium chloride  1 g Oral TID WC  . thiamine  100 mg Oral Daily   Continuous Infusions: . piperacillin-tazobactam (ZOSYN)  IV 3.375 g (02/15/20 1355)     LOS: 4 days   Aikeem Lilley  [facility]     02/15/2020, 4:16 PM

## 2020-02-15 NOTE — Progress Notes (Signed)
[facility]  ROUNDING NOTE   Subjective:  Serum sodium relatively stable at 133. However patient remains quite confused.   Objective:  Vital signs in last 24 hours:  Temp:  [97.4 F (36.3 C)-101.3 F (38.5 C)] 99.2 F (37.3 C) (03/09 0744) Pulse Rate:  [81-112] 106 (03/09 0744) Resp:  [16-17] 17 (03/09 0744) BP: (109-143)/(68-85) 143/68 (03/09 0744) SpO2:  [99 %-100 %] 99 % (03/09 0744)  Weight change:  Filed Weights   02/09/20 1656  Weight: 54 kg    Intake/Output: I/O last 3 completed shifts: In: 1400.1 [P.O.:480; IV Piggyback:920.1] Out: 1950 [DZHGD:9242]   Intake/Output this shift:  Total I/O In: 140 [P.O.:140] Out: -   Physical Exam: General: No acute distress  Head: Normocephalic, atraumatic. Moist oral mucosal membranes  Eyes: Anicteric  Neck: Supple, trachea midline  Lungs:  Clear to auscultation, normal effort  Heart: S1S2 no rubs  Abdomen:  Soft, nontender, bowel sounds present  Extremities: No peripheral edema.  Neurologic: Awake, alert, but confused  Skin: No lesions       Basic Metabolic Panel: Recent Labs  Lab 02/10/20 0802 02/10/20 0802 02/11/20 0911 02/11/20 0911 02/11/20 2152 02/12/20 0503 02/13/20 0628 02/14/20 0818  NA 129*   < > 124*  --  121* 129* 134* 133*  K 3.6  --  3.5  --   --  3.7 3.8 3.7  CL 90*  --  89*  --   --  94* 93* 100  CO2 29  --  26  --   --  28 27 26   GLUCOSE 97  --  107*  --   --  110* 114* 113*  BUN 11  --  12  --   --  17 20 22   CREATININE 0.71  --  0.77  --   --  0.77 0.74 0.71  CALCIUM 9.2   < > 8.9   < >  --  9.0 9.1 8.5*   < > = values in this interval not displayed.    Liver Function Tests: Recent Labs  Lab 02/09/20 1701  AST 27  ALT 31  ALKPHOS 63  BILITOT 0.6  PROT 7.1  ALBUMIN 3.6   No results for input(s): LIPASE, AMYLASE in the last 168 hours. No results for input(s): AMMONIA in the last 168 hours.  CBC: Recent Labs  Lab 02/09/20 1701 02/10/20 0802 02/13/20 0628  02/14/20 0818  WBC 10.6* 10.0 11.7* 11.1*  NEUTROABS  --   --   --  9.8*  HGB 12.6* 12.8* 12.1* 12.3*  HCT 37.6* 37.7* 35.7* 36.5*  MCV 98.4 98.2 97.8 98.4  PLT 322 279 274 273    Cardiac Enzymes: No results for input(s): CKTOTAL, CKMB, CKMBINDEX, TROPONINI in the last 168 hours.  BNP: Invalid input(s): POCBNP  CBG: No results for input(s): GLUCAP in the last 168 hours.  Microbiology: Results for orders placed or performed during the hospital encounter of 02/09/20  CULTURE, BLOOD (ROUTINE X 2) w Reflex to ID Panel     Status: None (Preliminary result)   Collection Time: 02/12/20  3:59 PM   Specimen: BLOOD  Result Value Ref Range Status   Specimen Description BLOOD RAC  Final   Special Requests   Final    BOTTLES DRAWN AEROBIC AND ANAEROBIC Blood Culture results may not be optimal due to an excessive volume of blood received in culture bottles   Culture   Final    NO GROWTH 3 DAYS Performed at [facility]  [facility], [address] [place], [place] [address]    Report Status PENDING  Incomplete  CULTURE, BLOOD (ROUTINE X 2) w Reflex to ID Panel     Status: None (Preliminary result)   Collection Time: 02/12/20  4:07 PM   Specimen: BLOOD RIGHT HAND  Result Value Ref Range Status   Specimen Description BLOOD RIGHT HAND [facility]  Final   Special Requests   Final    BOTTLES DRAWN AEROBIC AND ANAEROBIC Blood Culture adequate volume   Culture   Final    NO GROWTH 3 DAYS Performed at [facility], [address], [place], [place] [address]    Report Status PENDING  Incomplete  MRSA PCR Screening     Status: None   Collection Time: 02/13/20  3:03 PM   Specimen: Nasal Mucosa; Nasopharyngeal  Result Value Ref Range Status   MRSA by PCR NEGATIVE NEGATIVE Final    Comment:        The GeneXpert MRSA Assay (FDA approved for NASAL specimens only), is one component of a comprehensive MRSA colonization surveillance program. It is not intended to diagnose MRSA infection nor  to guide or monitor treatment for MRSA infections. Performed at [facility], [address] [place], [place] [address]     Coagulation Studies: No results for input(s): LABPROT, INR in the last 72 hours.  Urinalysis: Recent Labs    02/12/20 2028  COLORURINE YELLOW*  LABSPEC 1.023  PHURINE 6.0  GLUCOSEU NEGATIVE  HGBUR NEGATIVE  BILIRUBINUR NEGATIVE  KETONESUR 5*  PROTEINUR NEGATIVE  NITRITE NEGATIVE  LEUKOCYTESUR NEGATIVE      Imaging: No results found.   Medications:   . piperacillin-tazobactam (ZOSYN)  IV 3.375 g (02/15/20 1355)   . enoxaparin (LOVENOX) injection  1 mg/kg Subcutaneous Q12H  . feeding supplement (ENSURE ENLIVE)  237 mL Oral BID BM  . multivitamin with minerals  1 tablet Oral Daily  . predniSONE  20 mg Oral Q breakfast  . sodium chloride  1 g Oral TID WC  . thiamine  100 mg Oral Daily   acetaminophen **OR** acetaminophen, ondansetron **OR** ondansetron (ZOFRAN) IV, senna-docusate  Assessment/ Plan:  74 y.o. male with myasthenia gravis, lung cancer, osteoporosis, acute left DVT, PE after Covid pneumonia hospitalization January 2021, was admitted on 02/09/2020 with Hyponatremia [E87.1] Altered mental status, unspecified altered mental status type [R41.82] Acute confusional state [F05] .  #Hyponatremia.  Hyponatremia secondary to SIADH.  Serum sodium appears to be relatively stable at 133.  Continue fluid restriction as before as well as sodium chloride 1 g p.o. 3 times daily.  Monitor serum sodium periodically.  #Altered mental status.  Patient still having altered mental status, primary team following.  Diagnosis between CVA versus metastatic disease.   LOS: 4 Taliya Mcclard 3/9/20212:32 PM

## 2020-02-15 NOTE — Progress Notes (Signed)
   02/15/20 1532  Vitals  Temp (!) 100.6 F (38.1 C)  Temp Source Oral  BP 135/78  MAP (mmHg) 95  BP Location Right Arm  BP Method Automatic  Patient Position (if appropriate) Lying  Pulse Rate (!) 119  Resp 19  Oxygen Therapy  SpO2 98 %  O2 Device Room Air  MEWS Score  MEWS Temp 1  MEWS Systolic 0  MEWS Pulse 2  MEWS RR 0  MEWS LOC 0  MEWS Score 3  MEWS Score Color Yellow  Pt resting in the chair comfortably. No signs of stress. Pt currently stable. Administered 650 mg. Tylenol for fever. MD Notified.

## 2020-02-15 NOTE — Progress Notes (Signed)
ID brief note Immunocompromised patient with MG on pred and cellcept with lung mass, fevers, confusion. Recent covid as well.  At risk for opportunistic pulmonary pathogens.  Will check fungitel, crag, histo antigen and galactomanan. Please send biopsy tissue or BAL samples for fungal and AFB culture as well as PCP PCR and DFA. Will see tomorrow.

## 2020-02-15 NOTE — Progress Notes (Signed)
Palliative:  Checked in with patient - remains significantly confused - mostly nonverbal - occasional unintelligible speech. Does not appear to be in pain - rests well when not stimulated. Wife not at bedside. Reviewed notes and plan for biopsy Friday. Palliative will follow along.   Juel Burrow, DNP, AGNP-C Palliative Medicine Team Team Phone # [phone]  Pager # [phone]  NO CHARGE

## 2020-02-16 LAB — BASIC METABOLIC PANEL
Anion gap: 11 (ref 5–15)
BUN: 24 mg/dL — ABNORMAL HIGH (ref 8–23)
CO2: 29 mmol/L (ref 22–32)
Calcium: 9.5 mg/dL (ref 8.9–10.3)
Chloride: 99 mmol/L (ref 98–111)
Creatinine, Ser: 0.95 mg/dL (ref 0.61–1.24)
GFR calc Af Amer: >60 mL/min (ref >60)
GFR calc non Af Amer: >60 mL/min (ref >60)
Glucose, Bld: 103 mg/dL — ABNORMAL HIGH (ref 70–99)
Potassium: 4 mmol/L (ref 3.5–5.1)
Sodium: 139 mmol/L (ref 135–145)

## 2020-02-16 NOTE — Progress Notes (Signed)
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     3.14 cm  LEFT ATRIUM         Index LA diam:    1.80 cm 1.12 cm/m                        PULMONIC VALVE AORTA                 RVOT Peak grad: 3 mmHg Ao Root diam: 3.10 cm   SHUNTS Systemic Diam: 2.00 cm Duane Bush MD Electronically signed by Duane Bush MD Signature Date/Time: 02/11/2020/4:44:22 PM    Final      ASSESSMENT/PLAN   Right lower lobe 2cm nodule -patient wishes to move forward with PFT  -s/p MRI brain-abnormal -discussed with neurologist-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 -he will need to be off Eliquis for 7d prior to procedure -he wants to be DNR -Palliative evaluation is inprogress    Postobstructive pneumonia -Empirically on vancomycin/Zosyn-nasal MRSA PCR was negative and vancomycin has been DC'd  -Infectious disease on case-appreciate input    COVID19 infection - patient is no longer hypoxemic and is able to have saturations over 90% while at rest without using supplemental oxygen -we will order O2 concentrator   Severe hyponatremia   - need to replete this I suggest salt tabs and repeat  BMP-improved  -monitoring Na levels-nephrology is on case appreciate input sodium has improved significantly.  Altered mental status  - likely due to hyponatremia  -patient is improving -Neurology consultation - discussed case with Dr. Irish Elders- s/p EEG and MRI.  AMS is thought to be not due to [facility] PT/OT evaluation  -Psychiatric  evaluation per family request-appreciate input-needs to be more medically stabilized prior to appropriate evaluation.  Myesthenia Gravis   - continue with prednisone 20mg  po daily  - s/p neuro evaluation - possible mestinon therapy soon    Pulmonary embolism - patient on eliquis- will hold for now. He is on Lovenox now, we will stop this on Wednesday 02/16/20 -Have stopped Eliquis prior to hospitalization in preparation for ENB/EBUS      Thank you for allowing me to participate in the care of this patient.   Patient/Family are satisfied with care plan and all questions have been answered.  This document was prepared using Dragon voice recognition software and may include unintentional dictation errors.     Ottie Glazier, M.D.  Division of [facility]

## 2020-02-16 NOTE — NC FL2 (Signed)
[place] LEVEL OF CARE SCREENING TOOL     IDENTIFICATION  Patient Name: Duane Santos Birthdate: 12-Nov-1946 Sex: male Admission Date (Current Location): 02/09/2020  [place] and [place] Number:  Engineering geologist and Address:  [facility], [address], [place], [place] [address]      Provider Number: [id number]  Attending Physician Name and Address:  Wyvonnia Dusky, MD  Relative Name and Phone Number:  Perrin Maltese  [phone]    Current Level of Care: Hospital Recommended Level of Care: [place] Prior Approval Number:    Date Approved/Denied:   PASRR Number: [id number]  Discharge Plan: SNF    Current Diagnoses: Patient Active Problem List   Diagnosis Date Noted  . Acute confusional state 02/11/2020  . Palliative care encounter   . Generalized weakness 02/09/2020  . Hyponatremia 02/09/2020  . History of COVID-19 pneumonia with hypoxia 02/09/2020  . Acute metabolic encephalopathy 81/12/7508  . Chronic respiratory failure with hypoxia ([place]) 02/09/2020  . Right lower lobe lung mass 01/10/2020  . Acute pulmonary embolism without acute cor pulmonale ([place]) 01/10/2020  . Pneumonia due to COVID-19 virus 12/21/2019  . Acute respiratory failure with hypoxia ([place]) 12/21/2019  . Myasthenia gravis ([place]) 12/21/2019  . Normocytic anemia 12/21/2019  . Osteoporosis 12/21/2019  . Multifocal pneumonia 12/20/2019    Orientation RESPIRATION BLADDER Height & Weight     Self, Place, Situation  O2(2 liters continuous) External catheter Weight: 119 lb 0.8 oz (54 kg) Height:  5\' 6"  (167.6 cm)  BEHAVIORAL SYMPTOMS/MOOD NEUROLOGICAL BOWEL NUTRITION STATUS      Continent Diet(Dys 3 thin liquids)  AMBULATORY STATUS COMMUNICATION OF NEEDS Skin   Extensive Assist Verbally Normal                       Personal Care Assistance Level of Assistance  Bathing, Feeding Bathing Assistance: Limited  assistance Feeding assistance: Limited assistance       Functional Limitations Info             SPECIAL CARE FACTORS FREQUENCY  PT (By licensed PT), OT (By licensed OT)     PT Frequency: 5x week OT Frequency: 5x week            Contractures      Additional Factors Info  Code Status, Allergies Code Status Info: DNR Allergies Info: No known allergies           Current Medications (02/16/2020):  This is the current hospital active medication list Current Facility-Administered Medications  Medication Dose Route Frequency Provider Last Rate Last Admin  . acetaminophen (TYLENOL) tablet 650 mg  650 mg Oral Q6H PRN Athena Masse, MD   650 mg at 02/15/20 1545   Or  . acetaminophen (TYLENOL) suppository 650 mg  650 mg Rectal Q6H PRN Athena Masse, MD      . feeding supplement (ENSURE ENLIVE) (ENSURE ENLIVE) liquid 237 mL  237 mL Oral BID BM Jennye Boroughs, MD   237 mL at 02/16/20 0951  . multivitamin with minerals tablet 1 tablet  1 tablet Oral Daily Jennye Boroughs, MD   1 tablet at 02/16/20 0950  . ondansetron (ZOFRAN) tablet 4 mg  4 mg Oral Q6H PRN Athena Masse, MD       Or  . ondansetron [facility]) injection 4 mg  4 mg Intravenous Q6H PRN Athena Masse, MD      . piperacillin-tazobactam (ZOSYN) IVPB 3.375 g  3.375 g Intravenous Allie Dimmer, MD 12.5 mL/hr at 02/16/20 0533 3.375 g at 02/16/20 0533  . predniSONE (DELTASONE) tablet 20 mg  20 mg Oral Q breakfast Jennye Boroughs, MD   20 mg at 02/16/20 0950  . senna-docusate (Senokot-S) tablet 1 tablet  1 tablet Oral QHS PRN Athena Masse, MD      . sodium chloride tablet 1 g  1 g Oral TID WC Jennye Boroughs, MD   1 g at 02/16/20 1257  . thiamine tablet 100 mg  100 mg Oral Daily Jennye Boroughs, MD   100 mg at 02/16/20 1410     Discharge Medications: Please see discharge summary for a list of discharge medications.  Relevant Imaging Results:  Relevant Lab Results:   Additional Information SSN:  [id number]  Anila Bojarski, Gardiner Rhyme, LCSW

## 2020-02-16 NOTE — TOC Progression Note (Signed)
Transition of Care [facility]) - Progression Note    Patient Details  Name: Duane Santos MRN: [id number] Date of Birth: 02/01/1946  [facility] CM/SW Contact  Aloria Looper, Gardiner Rhyme, LCSW Phone Number: 02/16/2020, 2:12 PM  Clinical Narrative: Met with pt and wife to discuss discharge plan. Wife is upset regarding reason husband is not out of bed and why has PT & OT not coming today. He is to have a lung biopsy Friday. Wife had been calling private duty agencies to hire assist. She voiced she stopped due to not knowing when he will be ready for discharge. She wants information regarding [facility]. Have provided her with Aide and Attendant Form. Made aware therapies recommended SNF. She wants names of SNF's in [employer] [place]. Will call and provide this for her. Palliative care is also working with both. Wife feels pt is not at a level she can manage pt at home and is aware she would need to hire 24 hr care for him. Will continue to work on discharge plans. Wife seems to be very frustrated with her wanting pt up and moving more while here. Will try to find best option for pt and wife and work on for discharge.    Expected Discharge Plan: [place] Barriers to Discharge: Continued Medical Work up  Expected Discharge Plan and Services Expected Discharge Plan: [place] In-house Referral: Clinical Social Work   Post Acute Care Choice: Home Health, Durable Medical Equipment Living arrangements for the past 2 months: Single Family Home                                       Social Determinants of Health (SDOH) Interventions    Readmission Risk Interventions Readmission Risk Prevention Plan 12/30/2019  Transportation Screening Complete  Home Care Screening Complete  Medication Review (RN CM) Complete  Some recent data might be hidden

## 2020-02-16 NOTE — Progress Notes (Signed)
Wife requesting pt get out of bed to chair and to remove Condom catheter. Pt was placed on BSC, in which he had a loose stool, condom catheter was removed per wife request. Pt voided in toilet and was assisted to bedside chair with one person assist. Pt unable to follow commands. Much effort was used to accomplish above task.

## 2020-02-16 NOTE — Consult Note (Signed)
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 well-assessed. FINDINGS  Left Ventricle: Left ventricular ejection fraction, by estimation, is 65 to 70%. The left ventricle has normal function. Left ventricular endocardial border not optimally defined to evaluate regional wall motion. The left ventricular internal cavity size was normal in size. There is no left ventricular hypertrophy. Left ventricular diastolic function could not be evaluated. Right Ventricle: The right ventricular size is normal. No increase in right ventricular wall thickness. Right ventricular systolic function is normal. Tricuspid regurgitation signal is inadequate for assessing PA pressure. Left Atrium: Left atrial size was not well visualized. Right Atrium: Right atrial size was not well visualized. Pericardium: The pericardium was not well visualized. Mitral Valve: The mitral valve is grossly normal. No evidence of mitral valve regurgitation. Tricuspid Valve: The tricuspid valve is grossly normal. Tricuspid valve regurgitation is trivial. Aortic Valve: The  aortic valve was not well visualized. Aortic valve regurgitation not  well-assessed. Aortic stenosis was not well-assessed. Pulmonic Valve: The pulmonic valve was not well visualized. Pulmonic valve regurgitation is not visualized. No evidence of pulmonic stenosis. Aorta: The aortic root is normal in size and structure. Pulmonary Artery: The pulmonary artery is not well seen. Venous: The inferior vena cava was not well visualized. IAS/Shunts: The interatrial septum was not well visualized. Agitated saline contrast was given intravenously to evaluate for intracardiac shunting.  LEFT VENTRICLE PLAX 2D LVIDd:         3.33 cm LVIDs:         2.22 cm LV PW:         0.95 cm LV IVS:        0.83 cm LVOT diam:     2.00 cm LVOT Area:     3.14 cm  LEFT ATRIUM         Index LA diam:    1.80 cm 1.12 cm/m                        PULMONIC VALVE AORTA                 RVOT Peak grad: 3 mmHg Ao Root diam: 3.10 cm   SHUNTS Systemic Diam: 2.00 cm Nelva Bush MD Electronically signed by Nelva Bush MD Signature Date/Time: 02/11/2020/4:44:22 PM    Final     Assessment:   Duane Santos is a 74 y.o. 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 So far MRI brain with possible cerebellar subacute infarct, met or artifact, EEG non diagnostic, BCX neg, echo neg.  The differential dx for his fevers, confusion and pneumonia are broad in this immunocompromised patient and include typical etiologies of viral encephalitis as well as fungal causes. CNS mets or paraneoplastic encephalitis also possible. He is not septic and cultures negative, unlikely to be pyogenic bacterial infection.   Recommendations Will dc vanco as MRSA PCR neg and risk of nephrotoxicity. Cont zosyn for now. Sent fungal studies and will discuss with Neurology doing LP once eliquis washout complete.  Would proceed with lung bxp and  bronch and please send routine cx, AFB and fungal cx and PCP DFA and PCR from BAL.  Thank you very much for allowing me to participate in the care of this patient. Please call with questions.   Cheral Marker. Ola Spurr, MD

## 2020-02-16 NOTE — Progress Notes (Signed)
PROGRESS NOTE    Duane Santos  IOX:[id number] DOB: 1945-12-23 DOA: 02/09/2020 PCP: [place]      Assessment & Plan:   Principal Problem:   Acute confusional state Active Problems:   Multifocal pneumonia   Myasthenia gravis ([place])   Right lower lobe lung mass   Generalized weakness   Hyponatremia   History of COVID-19 pneumonia with hypoxia   Acute metabolic encephalopathy   Chronic respiratory failure with hypoxia ([place])   Palliative care encounter   Acute confusional state: etiology unclear, subacute CVA vs mets as per MRI. Improving. Continue supportive care. Echo shows EF 65-70%, interatrial septum was not well visualized.   Subacute CVA vs metastatic disease: as per MRI. Continue w/ neuro checks. PT/OT recommending SNF  Multifocal pneumonia: w/ recurrent fevers & probable hospital-acquired, immunocompromised state. 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 Continue fluid restriction and salt tablets.   Right lung mass: suspicious for malignancy, sclerotic changes of the right third fourth and fifth ribs suspicious for metastatic disease. Plan for biopsy on 02/18/20. Eliquis be held for 7 days prior to biopsy.  Follow-up with palliative care. Pulmon following and recs apprec  Recent pulmonary embolism and DVT: diagnosed on December 23, 2019. Eliquis has been held because of plan for biopsy.  Continue lovenox bridge for treatment of pulmonary embolism.  Myasthenia gravis: continue prednisone (his wife said his primary neurologist recommended that the dose should not be below 20 mg daily)  Leukocytosis: steroids induced vs infection. Will continue to monitor   Recent COVID-19 pneumonia/chronic hypoxemic respiratory failure: Continue supplemental oxygen   Depression: psychiatry consulted as per wife's request. Psych following and recs apprec   Generalized weakness/debility:PT and OT recommends SNF Body mass index is 19.21      DVT prophylaxis: lovenox Code Status: DNR Family Communication: discussed pt's care w/ pt's wife who was at bedside and answered her questions Disposition Plan: will likely need several days more, bronchoscopy w/ biopsy on 02/18/20; will likely need to d/c to SNF   Consultants:   Pulmon  Palliative care  Psychiatry   nephro  neuro   Procedures:    Antimicrobials: zosyn   Subjective: Pt c/o fatigue  Objective: Vitals:   02/15/20 2321 02/16/20 0323 02/16/20 0731 02/16/20 0900  BP: (!) 153/97 (!) 142/96 (!) 141/87   Pulse: 95 97 (!) 102   Resp: 16 16 17    Temp: 98.3 F (36.8 C) 98 F (36.7 C) 98.4 F (36.9 C)   TempSrc: Oral Oral Oral   SpO2: 100% 100% 99% 97%  Weight:      Height:        Intake/Output Summary (Last 24 hours) at 02/16/2020 1256 Last data filed at 02/15/2020 1829 Gross per 24 hour  Intake 630 ml  Output --  Net 630 ml   Filed Weights   02/09/20 1656  Weight: 54 kg    Examination:  General exam: Appears calm and comfortable  Respiratory system: diminished breath sounds b/l. No rales Cardiovascular system: S1 & S2. No rubs, gallops or clicks.  Gastrointestinal system: Abdomen is nondistended, soft and nontender. Normal bowel sounds heard.  Central nervous system: Alert and awake. Moves all 4 extremities  Psychiatry: Judgement and insight appear abnormal. Flat mood and affect    Data Reviewed: I have personally reviewed following labs and imaging studies  CBC: Recent Labs  Lab 02/09/20 1701 02/10/20 0802 02/13/20 0628 02/14/20 0818  WBC 10.6* 10.0 11.7* 11.1*  NEUTROABS  --   --   --  9.8*  HGB 12.6* 12.8* 12.1* 12.3*  HCT 37.6* 37.7* 35.7* 36.5*  MCV 98.4 98.2 97.8 98.4  PLT 322 279 274 333   Basic Metabolic Panel: Recent Labs  Lab 02/11/20 0911 02/11/20 0911 02/11/20 2152 02/12/20 0503 02/13/20 0628 02/14/20 0818 02/16/20 0617  NA 124*   < > 121* 129* 134* 133* 139  K 3.5  --   --  3.7 3.8 3.7 4.0  CL 89*  --    --  94* 93* 100 99  CO2 26  --   --  28 27 26 29   GLUCOSE 107*  --   --  110* 114* 113* 103*  BUN 12  --   --  17 20 22  24*  CREATININE 0.77  --   --  0.77 0.74 0.71 0.95  CALCIUM 8.9  --   --  9.0 9.1 8.5* 9.5   < > = values in this interval not displayed.   GFR: Estimated Creatinine Clearance: 53.7 mL/min (by C-G formula based on SCr of 0.95 mg/dL). Liver Function Tests: Recent Labs  Lab 02/09/20 1701  AST 27  ALT 31  ALKPHOS 63  BILITOT 0.6  PROT 7.1  ALBUMIN 3.6   No results for input(s): LIPASE, AMYLASE in the last 168 hours. No results for input(s): AMMONIA in the last 168 hours. Coagulation Profile: Recent Labs  Lab 02/09/20 1701  INR 1.1   Cardiac Enzymes: No results for input(s): CKTOTAL, CKMB, CKMBINDEX, TROPONINI in the last 168 hours. BNP (last 3 results) No results for input(s): PROBNP in the last 8760 hours. HbA1C: No results for input(s): HGBA1C in the last 72 hours. CBG: No results for input(s): GLUCAP in the last 168 hours. Lipid Profile: No results for input(s): CHOL, HDL, LDLCALC, TRIG, CHOLHDL, LDLDIRECT in the last 72 hours. Thyroid Function Tests: No results for input(s): TSH, T4TOTAL, FREET4, T3FREE, THYROIDAB in the last 72 hours. Anemia Panel: No results for input(s): VITAMINB12, FOLATE, FERRITIN, TIBC, IRON, RETICCTPCT in the last 72 hours. Sepsis Labs: No results for input(s): PROCALCITON, LATICACIDVEN in the last 168 hours.  Recent Results (from the past 240 hour(s))  CULTURE, BLOOD (ROUTINE X 2) w Reflex to ID Panel     Status: None (Preliminary result)   Collection Time: 02/12/20  3:59 PM   Specimen: BLOOD  Result Value Ref Range Status   Specimen Description BLOOD RAC  Final   Special Requests   Final    BOTTLES DRAWN AEROBIC AND ANAEROBIC Blood Culture results may not be optimal due to an excessive volume of blood received in culture bottles   Culture   Final    NO GROWTH 4 DAYS Performed at [facility], [address] [place], [place] [address]    Report Status PENDING  Incomplete  CULTURE, BLOOD (ROUTINE X 2) w Reflex to ID Panel     Status: None (Preliminary result)   Collection Time: 02/12/20  4:07 PM   Specimen: BLOOD RIGHT HAND  Result Value Ref Range Status   Specimen Description BLOOD RIGHT HAND [facility]  Final   Special Requests   Final    BOTTLES DRAWN AEROBIC AND ANAEROBIC Blood Culture adequate volume   Culture   Final    NO GROWTH 4 DAYS Performed at [facility], [address], [place], [place] [address]    Report Status PENDING  Incomplete  MRSA PCR Screening     Status: None   Collection Time: 02/13/20  3:03 PM   Specimen:  Nasal Mucosa; Nasopharyngeal  Result Value Ref Range Status   MRSA by PCR NEGATIVE NEGATIVE Final    Comment:        The GeneXpert MRSA Assay (FDA approved for NASAL specimens only), is one component of a comprehensive MRSA colonization surveillance program. It is not intended to diagnose MRSA infection nor to guide or monitor treatment for MRSA infections. Performed at [facility], [address], [place], [place] [address]          Radiology Studies: No results found.      Scheduled Meds: . feeding supplement (ENSURE ENLIVE)  237 mL Oral BID BM  . multivitamin with minerals  1 tablet Oral Daily  . predniSONE  20 mg Oral Q breakfast  . sodium chloride  1 g Oral TID WC  . thiamine  100 mg Oral Daily   Continuous Infusions: . piperacillin-tazobactam (ZOSYN)  IV 3.375 g (02/16/20 0533)     LOS: 5 days    Time spent: 35 mins     Wyvonnia Dusky, MD [facility] Pager [phone]  If 7PM-7AM, please contact night-coverage [email or web address] 02/16/2020, 12:56 PM

## 2020-02-16 NOTE — Progress Notes (Signed)
[facility]  ROUNDING NOTE   Subjective:  Patient seen at bedside. Still quite confused. Serum sodium normalized however now at 139.   Objective:  Vital signs in last 24 hours:  Temp:  [98 F (36.7 C)-100.6 F (38.1 C)] 98.4 F (36.9 C) (03/10 0731) Pulse Rate:  [95-119] 102 (03/10 0731) Resp:  [16-20] 17 (03/10 0731) BP: (96-153)/(67-97) 141/87 (03/10 0731) SpO2:  [97 %-100 %] 97 % (03/10 0900)  Weight change:  Filed Weights   02/09/20 1656  Weight: 54 kg    Intake/Output: I/O last 3 completed shifts: In: 630 [P.O.:630] Out: -    Intake/Output this shift:  No intake/output data recorded.  Physical Exam: General: No acute distress  Head: Normocephalic, atraumatic. Moist oral mucosal membranes  Eyes: Anicteric  Neck: Supple, trachea midline  Lungs:  Clear to auscultation, normal effort  Heart: S1S2 no rubs  Abdomen:  Soft, nontender, bowel sounds present  Extremities: No peripheral edema.  Neurologic: Awake, alert, but confused  Skin: No lesions       Basic Metabolic Panel: Recent Labs  Lab 02/11/20 0911 02/11/20 0911 02/11/20 2152 02/12/20 0503 02/12/20 0503 02/13/20 9924 02/14/20 0818 02/16/20 0617  NA 124*   < > 121* 129*  --  134* 133* 139  K 3.5  --   --  3.7  --  3.8 3.7 4.0  CL 89*  --   --  94*  --  93* 100 99  CO2 26  --   --  28  --  27 26 29   GLUCOSE 107*  --   --  110*  --  114* 113* 103*  BUN 12  --   --  17  --  20 22 24*  CREATININE 0.77  --   --  0.77  --  0.74 0.71 0.95  CALCIUM 8.9   < >  --  9.0   < > 9.1 8.5* 9.5   < > = values in this interval not displayed.    Liver Function Tests: Recent Labs  Lab 02/09/20 1701  AST 27  ALT 31  ALKPHOS 63  BILITOT 0.6  PROT 7.1  ALBUMIN 3.6   No results for input(s): LIPASE, AMYLASE in the last 168 hours. No results for input(s): AMMONIA in the last 168 hours.  CBC: Recent Labs  Lab 02/09/20 1701 02/10/20 0802 02/13/20 0628 02/14/20 0818  WBC 10.6* 10.0  11.7* 11.1*  NEUTROABS  --   --   --  9.8*  HGB 12.6* 12.8* 12.1* 12.3*  HCT 37.6* 37.7* 35.7* 36.5*  MCV 98.4 98.2 97.8 98.4  PLT 322 279 274 273    Cardiac Enzymes: No results for input(s): CKTOTAL, CKMB, CKMBINDEX, TROPONINI in the last 168 hours.  BNP: Invalid input(s): POCBNP  CBG: No results for input(s): GLUCAP in the last 168 hours.  Microbiology: Results for orders placed or performed during the hospital encounter of 02/09/20  CULTURE, BLOOD (ROUTINE X 2) w Reflex to ID Panel     Status: None (Preliminary result)   Collection Time: 02/12/20  3:59 PM   Specimen: BLOOD  Result Value Ref Range Status   Specimen Description BLOOD RAC  Final   Special Requests   Final    BOTTLES DRAWN AEROBIC AND ANAEROBIC Blood Culture results may not be optimal due to an excessive volume of blood received in culture bottles   Culture   Final    NO GROWTH 4 DAYS Performed at [facility], [address] [place], [place] [address]    Report Status PENDING  Incomplete  CULTURE, BLOOD (ROUTINE X 2) w Reflex to ID Panel     Status: None (Preliminary result)   Collection Time: 02/12/20  4:07 PM   Specimen: BLOOD RIGHT HAND  Result Value Ref Range Status   Specimen Description BLOOD RIGHT HAND [facility]  Final   Special Requests   Final    BOTTLES DRAWN AEROBIC AND ANAEROBIC Blood Culture adequate volume   Culture   Final    NO GROWTH 4 DAYS Performed at [facility], [address], [place], [place] [address]    Report Status PENDING  Incomplete  MRSA PCR Screening     Status: None   Collection Time: 02/13/20  3:03 PM   Specimen: Nasal Mucosa; Nasopharyngeal  Result Value Ref Range Status   MRSA by PCR NEGATIVE NEGATIVE Final    Comment:        The GeneXpert MRSA Assay (FDA approved for NASAL specimens only), is one component of a comprehensive MRSA colonization surveillance program. It is not intended to diagnose MRSA infection nor to guide or monitor treatment  for MRSA infections. Performed at [facility], [address] [place], [place] [address]     Coagulation Studies: No results for input(s): LABPROT, INR in the last 72 hours.  Urinalysis: No results for input(s): COLORURINE, LABSPEC, PHURINE, GLUCOSEU, HGBUR, BILIRUBINUR, KETONESUR, PROTEINUR, UROBILINOGEN, NITRITE, LEUKOCYTESUR in the last 72 hours.  Invalid input(s): APPERANCEUR    Imaging: No results found.   Medications:   . piperacillin-tazobactam (ZOSYN)  IV 3.375 g (02/16/20 0533)   . feeding supplement (ENSURE ENLIVE)  237 mL Oral BID BM  . multivitamin with minerals  1 tablet Oral Daily  . predniSONE  20 mg Oral Q breakfast  . sodium chloride  1 g Oral TID WC  . thiamine  100 mg Oral Daily   acetaminophen **OR** acetaminophen, ondansetron **OR** ondansetron (ZOFRAN) IV, senna-docusate  Assessment/ Plan:  74 y.o. male with myasthenia gravis, lung cancer, osteoporosis, acute left DVT, PE after Covid pneumonia hospitalization January 2021, was admitted on 02/09/2020 with Hyponatremia [E87.1] Altered mental status, unspecified altered mental status type [R41.82] Acute confusional state [F05] .  #Hyponatremia.  Hyponatremia secondary to SIADH.  Serum sodium now actually improved to 139.  We recommend fluid restriction of 1 to 1.2 L per 24 hours.  Patient's wife updated regarding this.  Okay to continue sodium chloride 1 g p.o. 3 times daily.  #Altered mental status.  Patient still has alterations in mental status.  Delirium also being suspected.  Patient also had some CNS changes on imaging however unclear as to whether these findings fully account for his altered mental status now.     LOS: 5 Anastasha Ortez 3/10/20212:17 PM

## 2020-02-17 ENCOUNTER — Encounter: Payer: Self-pay | Admitting: Internal Medicine

## 2020-02-17 ENCOUNTER — Inpatient Hospital Stay: Payer: Medicare HMO

## 2020-02-17 DIAGNOSIS — F05 Delirium due to known physiological condition: Secondary | ICD-10-CM

## 2020-02-17 LAB — APTT: aPTT: <24 seconds — ABNORMAL LOW (ref 24–36)

## 2020-02-17 LAB — BASIC METABOLIC PANEL
Anion gap: 9 (ref 5–15)
BUN: 26 mg/dL — ABNORMAL HIGH (ref 8–23)
CO2: 31 mmol/L (ref 22–32)
Calcium: 9.7 mg/dL (ref 8.9–10.3)
Chloride: 105 mmol/L (ref 98–111)
Creatinine, Ser: 0.79 mg/dL (ref 0.61–1.24)
GFR calc Af Amer: >60 mL/min (ref >60)
GFR calc non Af Amer: >60 mL/min (ref >60)
Glucose, Bld: 120 mg/dL — ABNORMAL HIGH (ref 70–99)
Potassium: 4.1 mmol/L (ref 3.5–5.1)
Sodium: 145 mmol/L (ref 135–145)

## 2020-02-17 LAB — CBC
HCT: 38.6 % — ABNORMAL LOW (ref 39.0–52.0)
Hemoglobin: 12.8 g/dL — ABNORMAL LOW (ref 13.0–17.0)
MCH: 33.2 pg (ref 26.0–34.0)
MCHC: 33.2 g/dL (ref 30.0–36.0)
MCV: 100.3 fL — ABNORMAL HIGH (ref 80.0–100.0)
Platelets: 297 10*3/uL (ref 150–400)
RBC: 3.85 MIL/uL — ABNORMAL LOW (ref 4.22–5.81)
RDW: 15.6 % — ABNORMAL HIGH (ref 11.5–15.5)
WBC: 11.7 10*3/uL — ABNORMAL HIGH (ref 4.0–10.5)
nRBC: 0 % (ref 0.0–0.2)

## 2020-02-17 LAB — PROTIME-INR
INR: 0.9 (ref 0.8–1.2)
Prothrombin Time: 12.1 seconds (ref 11.4–15.2)

## 2020-02-17 LAB — CULTURE, BLOOD (ROUTINE X 2)
Culture: NO GROWTH
Culture: NO GROWTH
Special Requests: ADEQUATE

## 2020-02-17 MED ORDER — DEXTROSE 5% FOR FLUSHING BEFORE AND AFTER AMBISOME
10.0000 mL | INTRAVENOUS | Status: DC
  Administered 2020-02-17 – 2020-02-24 (×7): 10 mL via INTRAVENOUS
  Filled 2020-02-17 (×8): qty 50
  Filled 2020-02-17: qty 10
  Filled 2020-02-17 (×4): qty 50
  Filled 2020-02-17 (×2): qty 10
  Filled 2020-02-17 (×2): qty 50
  Filled 2020-02-17: qty 10

## 2020-02-17 MED ORDER — DEXTROSE 5 % IV SOLN
200.0000 mg | INTRAVENOUS | Status: DC
  Administered 2020-02-17 – 2020-02-23 (×7): 200 mg via INTRAVENOUS
  Filled 2020-02-17 (×8): qty 50

## 2020-02-17 MED ORDER — DIPHENHYDRAMINE HCL 50 MG/ML IJ SOLN: 25.0000 mg | Freq: Every day | INTRAMUSCULAR | Status: DC | PRN

## 2020-02-17 MED ORDER — IOHEXOL 300 MG/ML  SOLN
75.0000 mL | Freq: Once | INTRAMUSCULAR | Status: AC | PRN
  Administered 2020-02-17: 60 mL via INTRAVENOUS

## 2020-02-17 MED ORDER — DIPHENHYDRAMINE HCL 25 MG PO CAPS: 25.0000 mg | ORAL_CAPSULE | Freq: Every day | ORAL | Status: DC | PRN

## 2020-02-17 MED ORDER — FLUCYTOSINE 250 MG PO CAPS
25.0000 mg/kg | ORAL_CAPSULE | Freq: Four times a day (QID) | ORAL | Status: DC
  Administered 2020-02-17 – 2020-02-24 (×27): 1250 mg via ORAL
  Filled 2020-02-17 (×36): qty 5

## 2020-02-17 MED ORDER — MEPERIDINE HCL 25 MG/ML IJ SOLN
25.0000 mg | INTRAMUSCULAR | Status: DC | PRN
  Administered 2020-02-19: 25 mg via INTRAVENOUS
  Filled 2020-02-17: qty 1

## 2020-02-17 MED ORDER — METOPROLOL TARTRATE 25 MG PO TABS
25.0000 mg | ORAL_TABLET | Freq: Two times a day (BID) | ORAL | Status: DC | PRN
  Administered 2020-02-17: 25 mg via ORAL
  Filled 2020-02-17: qty 1

## 2020-02-17 MED ORDER — DEXTROSE 5% FOR FLUSHING BEFORE AND AFTER AMBISOME
10.0000 mL | INTRAVENOUS | Status: DC
  Administered 2020-02-17 – 2020-02-23 (×8): 10 mL via INTRAVENOUS
  Filled 2020-02-17: qty 50
  Filled 2020-02-17: qty 10
  Filled 2020-02-17: qty 50
  Filled 2020-02-17: qty 10
  Filled 2020-02-17 (×5): qty 50
  Filled 2020-02-17 (×2): qty 10

## 2020-02-17 MED ORDER — ACETAMINOPHEN 325 MG PO TABS
650.0000 mg | ORAL_TABLET | Freq: Every day | ORAL | Status: DC | PRN
  Administered 2020-02-22: 650 mg via ORAL
  Filled 2020-02-17: qty 2

## 2020-02-17 MED ORDER — SODIUM CHLORIDE 0.9 % IV BOLUS FOR AMBISOME
500.0000 mL | INTRAVENOUS | Status: DC
  Administered 2020-02-17 – 2020-02-23 (×7): 500 mL via INTRAVENOUS

## 2020-02-17 MED ORDER — SODIUM CHLORIDE 0.9 % IV BOLUS FOR AMBISOME
500.0000 mL | INTRAVENOUS | Status: DC
  Administered 2020-02-17 – 2020-02-24 (×7): 500 mL via INTRAVENOUS

## 2020-02-17 MED ORDER — FLUCYTOSINE 500 MG PO CAPS
25.0000 mg/kg | ORAL_CAPSULE | Freq: Four times a day (QID) | ORAL | Status: DC
  Filled 2020-02-17: qty 1

## 2020-02-17 MED ORDER — ENSURE ENLIVE PO LIQD
237.0000 mL | Freq: Three times a day (TID) | ORAL | Status: DC
  Administered 2020-02-17 – 2020-02-24 (×19): 237 mL via ORAL

## 2020-02-17 NOTE — TOC Progression Note (Signed)
Transition of Care [facility]) - Progression Note    Patient Details  Name: Duane Santos MRN: [id number] Date of Birth: August 28, 1946  Transition of Care [facility]) CM/SW Contact  Heru Montz, Gardiner Rhyme, LCSW Phone Number: 02/17/2020, 10:32 AM  Clinical Narrative:   Met with wife to give her the information for Aide and Attendant for the [facility] services although pt is not connected with the [facility] but is a veteran. Discussed SNF versus CIR. Wife wants to pursue CIR she wants him where he can receive 3 hours of therapy and can get better. Once she found out she can not visit at a SNF she feels this is not the best option for pt. She feels he will get worse in one. Will talk with PT and possibly contact [facility] liaison to see if can evaluate pt for CIR, once ready to leave here.    Expected Discharge Plan: [place] Barriers to Discharge: Continued Medical Work up  Expected Discharge Plan and Services Expected Discharge Plan: [place] In-house Referral: Clinical Social Work   Post Acute Care Choice: Home Health, Durable Medical Equipment Living arrangements for the past 2 months: Single Family Home                                       Social Determinants of Health (SDOH) Interventions    Readmission Risk Interventions Readmission Risk Prevention Plan 12/30/2019  Transportation Screening Complete  Home Care Screening Complete  Medication Review (RN CM) Complete  Some recent data might be hidden

## 2020-02-17 NOTE — Progress Notes (Signed)
Physical Therapy Treatment Patient Details Name: Duane Santos MRN: [id number] DOB: March 02, 1946 Today's Date: 02/17/2020    History of Present Illness presented to ER secondary to AMS, progressive weakness and repeated falls; admitted for management of acute metabolic encephalopathy.  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 PMH significant for MG, covid-19 infection (1/21) and newly-diagnosed R lung cancer.    PT Comments    Pt was seated in recliner upon arriving with RN in room and spouse present. Pt was using urinal upon arriving and started to have BM on floor. Therapist quickly assisted pt to BR without AD. He require min-mod assist to prevent fall without use of AD. With use of RW pt only require min assist. He continues to be confused however was able to follow one step commands and responds to questions appropriately. Oriented to self only. Session limited by pt having several BMs and urinating frequently. Pt required 4 L o2 throughout. Attempted wean however desats to 84% with HR elevation to 130 from 98 with ambulation to doorway and return. Pt was seated up in recliner with spouse present and RN aware of progression.  Lengthy discussion about d/c disposition. PT recommends DC to CIR/SNF for safety 2/2 to pt's impulsivity and poor safety awareness. PT will continue to follow pt per POC.    Follow Up Recommendations  CIR;SNF     Equipment Recommendations  Rolling walker with 5" wheels;3in1 (PT)    Recommendations for Other Services       Precautions / Restrictions Precautions Precautions: Fall Restrictions Weight Bearing Restrictions: No    Mobility  Bed Mobility               General bed mobility comments: pt was in recliner pre/post session  Transfers Overall transfer level: Needs assistance Equipment used: Rolling walker (2 wheeled) Transfers: Sit to/from Stand Sit to Stand: Min assist         General transfer  comment: Min assist to stand from recliner and toilet. pt performed STS 3 x from toilet. He is incontinent and unable to know whenhe is having BM/urinating. Vcs during transfers for handplacement and improved techmnique  Ambulation/Gait Ambulation/Gait assistance: Min assist Gait Distance (Feet): 30 Feet Assistive device: Rolling walker (2 wheeled) Gait Pattern/deviations: Step-through pattern Gait velocity: decreased   General Gait Details: pt was able to ambulate with min assist to BR and return two x. one with RW and once without AD 2/2 pt having BM in room floor.   Stairs             Wheelchair Mobility    Modified Rankin (Stroke Patients Only)       Balance                                            Cognition Arousal/Alertness: Awake/alert Behavior During Therapy: WFL for tasks assessed/performed Overall Cognitive Status: Impaired/Different from baseline Area of Impairment: Orientation;Attention;Memory;Following commands;Safety/judgement;Awareness;Problem solving                 Orientation Level: Person Current Attention Level: Alternating Memory: Decreased short-term memory;Decreased recall of precautions Following Commands: Follows one step commands inconsistently Safety/Judgement: Decreased awareness of safety;Decreased awareness of deficits   Problem Solving: Slow processing;Decreased initiation;Difficulty sequencing;Requires verbal cues;Requires tactile cues General Comments: pt was able to follow one step commands but does require several occasions  of repeating desired task and increased time to process.       Exercises      General Comments        Pertinent Vitals/Pain Pain Assessment: No/denies pain    Home Living                      Prior Function            PT Goals (current goals can now be found in the care plan section) Acute Rehab PT Goals Patient Stated Goal: agreeable to session     Frequency    Min 2X/week      PT Plan Current plan remains appropriate    Co-evaluation              AM-PAC PT "6 Clicks" Mobility   Outcome Measure                   End of Session Equipment Utilized During Treatment: Gait belt Activity Tolerance: Patient tolerated treatment well;Patient limited by fatigue Patient left: in chair;with chair alarm set;with family/visitor present Nurse Communication: Mobility status PT Visit Diagnosis: Muscle weakness (generalized) (M62.81);Other abnormalities of gait and mobility (R26.89);Unsteadiness on feet (R26.81);Other symptoms and signs involving the nervous system (R29.898);Repeated falls (R29.6)     Time: 4514-6047 PT Time Calculation (min) (ACUTE ONLY): 41 min  Charges:  $Gait Training: 8-22 mins $Therapeutic Activity: 23-37 mins                     Julaine Fusi PTA 02/17/20, 2:22 PM

## 2020-02-17 NOTE — Progress Notes (Signed)
Pharmacy Antibiotic Note - Amphotericin lipsomal dosing and monitoring  Duane Santos is a 74 y.o. 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   Plan for stat LP - hoping to be done 3/11  Height: 5\' 6"  (167.6 cm) Weight: 119 lb 0.8 oz (54 kg) IBW/kg (Calculated) : 63.8  Temp (24hrs), Avg:98.6 F (37 C), Min:97.9 F (36.6 C), Max:98.9 F (37.2 C)  BMP Latest Ref Rng & Units 02/17/2020 02/16/2020 02/14/2020  Glucose 70 - 99 mg/dL 120(H) 103(H) 113(H)  BUN 8 - 23 mg/dL 26(H) 24(H) 22  Creatinine 0.61 - 1.24 mg/dL 0.79 0.95 0.71  Sodium 135 - 145 mmol/L 145 139 133(L)  Potassium 3.5 - 5.1 mmol/L 4.1 4.0 3.7  Chloride 98 - 111 mmol/L 105 99 100  CO2 22 - 32 mmol/L 31 29 26   Calcium 8.9 - 10.3 mg/dL 9.7 9.5 8.5(L)    Estimated Creatinine Clearance: 63.8 mL/min (by C-G formula based on SCr of 0.79 mg/dL).    No Known Allergies    Thank you for allowing pharmacy to be a part of this patient's care.  Doreene Eland, PharmD, BCPS.   Work Cell: [phone] 02/17/2020 4:07 PM

## 2020-02-17 NOTE — Care Management Important Message (Signed)
Important Message  Patient Details  Name: Duane Santos MRN: [id number] Date of Birth: June 18, 1946   Medicare Important Message Given:  Yes     Juliann Pulse A Duane Santos 02/17/2020, 11:38 AM

## 2020-02-17 NOTE — Progress Notes (Signed)
Subjective: Still periods of confusion   Objective: Current vital signs: BP 122/83 (BP Location: Right Arm)   Pulse 99   Temp 98.7 F (37.1 C)   Resp (!) 22   Ht 5\' 6"  (1.676 m)   Wt 54 kg   SpO2 100%   BMI 19.21 kg/m  Vital signs in last 24 hours: Temp:  [97.9 F (36.6 C)-98.9 F (37.2 C)] 98.7 F (37.1 C) (03/11 1357) Pulse Rate:  [99-125] 99 (03/11 1357) Resp:  [16-22] 22 (03/11 1121) BP: (122-148)/(70-95) 122/83 (03/11 1357) SpO2:  [100 %] 100 % (03/11 1357)  Intake/Output from previous day: 03/10 0701 - 03/11 0700 In: -  Out: 200 [Urine:200] Intake/Output this shift: No intake/output data recorded. Nutritional status:  Diet Order            DIET DYS 3 Room service appropriate? Yes; Fluid consistency: Thin  Diet effective now              Neurologic Exam: Mental Status: Alert, oriented X 4.  At times does not answer questioning and just stares ahead.  Speech fluent without evidence of aphasia but has some evidence of apraxia.  Able to follow 3 step commands without difficulty. Cranial Nerves: II: Visual fields grossly normal, pupils equal, round, reactive to light and accommodation III,IV, VI: ptosis not present, extra-ocular motions intact bilaterally V,VII: smile symmetric, facial light touch sensation normal bilaterally VIII: hearing normal bilaterally IX,X: gag reflex present XI: bilateral shoulder shrug XII: midline tongue extension Motor: Able to lift all extremities against gravity and maintain with some mild generalized weakness noted  Lab Results: Basic Metabolic Panel: Recent Labs  Lab 02/12/20 0503 02/12/20 0503 02/13/20 2409 02/13/20 7353 02/14/20 0818 02/16/20 0617 02/17/20 0353  NA 129*  --  134*  --  133* 139 145  K 3.7  --  3.8  --  3.7 4.0 4.1  CL 94*  --  93*  --  100 99 105  CO2 28  --  27  --  26 29 31   GLUCOSE 110*  --  114*  --  113* 103* 120*  BUN 17  --  20  --  22 24* 26*  CREATININE 0.77  --  0.74  --  0.71 0.95  0.79  CALCIUM 9.0   < > 9.1   < > 8.5* 9.5 9.7   < > = values in this interval not displayed.    Liver Function Tests: No results for input(s): AST, ALT, ALKPHOS, BILITOT, PROT, ALBUMIN in the last 168 hours. No results for input(s): LIPASE, AMYLASE in the last 168 hours. No results for input(s): AMMONIA in the last 168 hours.  CBC: Recent Labs  Lab 02/13/20 0628 02/14/20 0818 02/17/20 0353  WBC 11.7* 11.1* 11.7*  NEUTROABS  --  9.8*  --   HGB 12.1* 12.3* 12.8*  HCT 35.7* 36.5* 38.6*  MCV 97.8 98.4 100.3*  PLT 274 273 297    Cardiac Enzymes: No results for input(s): CKTOTAL, CKMB, CKMBINDEX, TROPONINI in the last 168 hours.  Lipid Panel: No results for input(s): CHOL, TRIG, HDL, CHOLHDL, VLDL, LDLCALC in the last 168 hours.  CBG: No results for input(s): GLUCAP in the last 168 hours.  Microbiology: Results for orders placed or performed during the hospital encounter of 02/09/20  CULTURE, BLOOD (ROUTINE X 2) w Reflex to ID Panel     Status: None   Collection Time: 02/12/20  3:59 PM   Specimen: BLOOD  Result Value Ref Range  Status   Specimen Description BLOOD RAC  Final   Special Requests   Final    BOTTLES DRAWN AEROBIC AND ANAEROBIC Blood Culture results may not be optimal due to an excessive volume of blood received in culture bottles   Culture   Final    NO GROWTH 5 DAYS Performed at [facility], [address] [place], [place] [address]    Report Status 02/17/2020 FINAL  Final  CULTURE, BLOOD (ROUTINE X 2) w Reflex to ID Panel     Status: None   Collection Time: 02/12/20  4:07 PM   Specimen: BLOOD RIGHT HAND  Result Value Ref Range Status   Specimen Description BLOOD RIGHT HAND [facility]  Final   Special Requests   Final    BOTTLES DRAWN AEROBIC AND ANAEROBIC Blood Culture adequate volume   Culture   Final    NO GROWTH 5 DAYS Performed at [facility], [address] [place], [place] [address]    Report Status 02/17/2020 FINAL  Final   MRSA PCR Screening     Status: None   Collection Time: 02/13/20  3:03 PM   Specimen: Nasal Mucosa; Nasopharyngeal  Result Value Ref Range Status   MRSA by PCR NEGATIVE NEGATIVE Final    Comment:        The GeneXpert MRSA Assay (FDA approved for NASAL specimens only), is one component of a comprehensive MRSA colonization surveillance program. It is not intended to diagnose MRSA infection nor to guide or monitor treatment for MRSA infections. Performed at [facility], [address] [place], [place] [address]     Coagulation Studies: No results for input(s): LABPROT, INR in the last 72 hours.  Imaging: No results found.  Medications:  I have reviewed the patient's current medications. Scheduled: . feeding supplement (ENSURE ENLIVE)  237 mL Oral TID BM  . multivitamin with minerals  1 tablet Oral Daily  . predniSONE  20 mg Oral Q breakfast  . sodium chloride  1 g Oral TID WC  . 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 Patient hyponatremic. MRI of the brain personally reviewed and shows several small areas of restricted diffusion in the cerebellum bilaterally.  Likely acute/subacute infarcts but can not rule out metastasis.  Results discussed with hospitalist and patient restarted on anticoagulation.   Presenting symptoms likely multifactorial in etiology and related to recent COVID infection, hyponatremia and malignancy.    - Will need PFT with RLL nodule. Eliquis.  - Awaiting LP as positive serum crypto and  need to check cytology along with opening pressure - Still periods of delirium. Potentially paraneoplastic panel as well.  - Potentially MRI brain w/ and w/out post LP

## 2020-02-17 NOTE — Progress Notes (Signed)
[facility] INFECTIOUS DISEASE PROGRESS NOTE Date of Admission:  02/09/2020     ID: Duane Santos is a 74 y.o. male with AMS Principal Problem:   Acute confusional state Active Problems:   Multifocal pneumonia   Myasthenia gravis ([place])   Right lower lobe lung mass   Generalized weakness   Hyponatremia   History of COVID-19 pneumonia with hypoxia   Acute metabolic encephalopathy   Chronic respiratory failure with hypoxia [facility])   Palliative care encounter   Subjective: Remains confused but oob to chair. No fevers  ROS  Unable to obtain  Medications:  Antibiotics Given (last 72 hours)    Date/Time Action Medication Dose Rate   02/14/20 1432 New Bag/Given   piperacillin-tazobactam (ZOSYN) IVPB 3.375 g 3.375 g 12.5 mL/hr   02/14/20 2128 New Bag/Given   piperacillin-tazobactam (ZOSYN) IVPB 3.375 g 3.375 g 12.5 mL/hr   02/15/20 0859 New Bag/Given   piperacillin-tazobactam (ZOSYN) IVPB 3.375 g 3.375 g 12.5 mL/hr   02/15/20 1355 New Bag/Given   piperacillin-tazobactam (ZOSYN) IVPB 3.375 g 3.375 g 12.5 mL/hr   02/15/20 2204 New Bag/Given   piperacillin-tazobactam (ZOSYN) IVPB 3.375 g 3.375 g 12.5 mL/hr   02/16/20 0533 New Bag/Given   piperacillin-tazobactam (ZOSYN) IVPB 3.375 g 3.375 g 12.5 mL/hr   02/16/20 1430 New Bag/Given   piperacillin-tazobactam (ZOSYN) IVPB 3.375 g 3.375 g 12.5 mL/hr   02/16/20 2152 New Bag/Given   piperacillin-tazobactam (ZOSYN) IVPB 3.375 g 3.375 g 12.5 mL/hr   02/17/20 0525 New Bag/Given   piperacillin-tazobactam (ZOSYN) IVPB 3.375 g 3.375 g 12.5 mL/hr     . feeding supplement (ENSURE ENLIVE)  237 mL Oral TID BM  . multivitamin with minerals  1 tablet Oral Daily  . predniSONE  20 mg Oral Q breakfast  . sodium chloride  1 g Oral TID WC  . thiamine  100 mg Oral Daily    Objective: Vital signs in last 24 hours: Temp:  [97.9 F (36.6 C)-98.9 F (37.2 C)] 98.7 F (37.1 C) (03/11 1357) Pulse Rate:  [99-125] 99 (03/11 1357) Resp:  [16-22] 22  (03/11 1121) BP: (122-148)/(70-95) 122/83 (03/11 1357) SpO2:  [100 %] 100 % (03/11 1357) Constitutional: He is thin, on O2,confused and unable to give a history HENT: anicteric Mouth/Throat: Oropharynx is clear and moist. Cardiovascular: Normal rate, regular rhythm and normal heart sounds. Exam reveals no gallop and no friction rub. No murmur heard.  Pulmonary/Chest: rhonchi bil Abdominal: Soft. Bowel sounds are normal. He exhibits no distension. There is no tenderness.  Lymphadenopathy:  He has no cervical adenopathy.  Neurological: able to move all 4, confused but trying to think of answers when questions asked Skin: Skin is warm and dry. No rash noted. No erythema.  Psychiatric: confused  Lab Results Recent Labs    02/16/20 0617 02/17/20 0353  WBC  --  11.7*  HGB  --  12.8*  HCT  --  38.6*  NA 139 145  K 4.0 4.1  CL 99 105  CO2 29 31  BUN 24* 26*  CREATININE 0.95 0.79    Microbiology: Results for orders placed or performed during the hospital encounter of 02/09/20  CULTURE, BLOOD (ROUTINE X 2) w Reflex to ID Panel     Status: None   Collection Time: 02/12/20  3:59 PM   Specimen: BLOOD  Result Value Ref Range Status   Specimen Description BLOOD RAC  Final   Special Requests   Final    BOTTLES DRAWN AEROBIC AND ANAEROBIC Blood  Culture results may not be optimal due to an excessive volume of blood received in culture bottles   Culture   Final    NO GROWTH 5 DAYS Performed at [facility], [address] [place], [place] [address]    Report Status 02/17/2020 FINAL  Final  CULTURE, BLOOD (ROUTINE X 2) w Reflex to ID Panel     Status: None   Collection Time: 02/12/20  4:07 PM   Specimen: BLOOD RIGHT HAND  Result Value Ref Range Status   Specimen Description BLOOD RIGHT HAND [facility]  Final   Special Requests   Final    BOTTLES DRAWN AEROBIC AND ANAEROBIC Blood Culture adequate volume   Culture   Final    NO GROWTH 5 DAYS Performed at [facility],  [address] [place] [address], [place], [place] [address]    Report Status 02/17/2020 FINAL  Final  MRSA PCR Screening     Status: None   Collection Time: 02/13/20  3:03 PM   Specimen: Nasal Mucosa; Nasopharyngeal  Result Value Ref Range Status   MRSA by PCR NEGATIVE NEGATIVE Final    Comment:        The GeneXpert MRSA Assay (FDA approved for NASAL specimens only), is one component of a comprehensive MRSA colonization surveillance program. It is not intended to diagnose MRSA infection nor to guide or monitor treatment for MRSA infections. Performed at [facility], [address] [place], [place] [address]     Studies/Results: No results found.  Assessment/Plan: Duane Santos is a 74 y.o. 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 So far MRI brain with possible cerebellar subacute infarct, met or artifact, EEG non diagnostic, BCX neg, echo neg.  The differential dx for his fevers, confusion and pneumonia are broad in this immunocompromised patient and include typical etiologies of viral encephalitis as well as fungal causes. CNS mets or paraneoplastic encephalitis also possible. He is not septic and cultures negative, unlikely to be pyogenic bacterial infection. 3/11- no fevers, wbc 11. Remains confused.   CRAG very high on serum   Recommendations LP ordered - urgent to measure op and decrease if elevted > 25. Instructions placed in LP order set. Could lung lesion be Crypto? Start amphotericin and 5Fc.  Thank you very much for the consult. Will follow with you.  Duane Santos   02/17/2020, 2:01 PM

## 2020-02-17 NOTE — Progress Notes (Signed)
Occupational Therapy Treatment Patient Details Name: Duane Santos MRN: [id number] DOB: 05/05/1946 Today's Date: 02/17/2020    History of present illness presented to ER secondary to AMS, progressive weakness and repeated falls; admitted for management of acute metabolic encephalopathy.  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 PMH significant for MG, covid-19 infection (1/21) and newly-diagnosed R lung cancer.   OT comments  Pt seen this AM targeting ADL retraining.  Pt demonstrating overall improved cognition regarding sequencing, task initiation and completion, self pacing, following directions, attention and concentration.  Pt able to follow 1 step commands consistently during session and communicate appropriately.  Continues to demonstrate memory deficits and will repeatedly ask questions.  Patient agreeable to session and performed bed mobility with SBA and VCs for sequencing.  Performed sit<>stand with CGA x 2 for safety.  SPT to recliner with CGA x 2.  Pt performed simple grooming/hygiene tasks with set up/SPV.  Assisted pt with set up for breakfast; SPV recommended for correct usage of items with minimal VCs.  Patient participated well.  Pt would continue to benefit from skilled OT services to address activity tolerance, cognitive remediation, safety awareness, ADL retraining, and functional transfers.  Based on today's performance, continuing to recommend SNF.    Follow Up Recommendations  SNF    Equipment Recommendations  Other (comment)(defer to next level of care)    Recommendations for Other Services      Precautions / Restrictions Precautions Precautions: Fall Restrictions Weight Bearing Restrictions: No       Mobility Bed Mobility Overal bed mobility: Needs Assistance Bed Mobility: Supine to Sit     Supine to sit: Supervision;HOB elevated(VCs for sequencing)        Transfers Overall transfer level: Needs  assistance Equipment used: Rolling walker (2 wheeled) Transfers: Sit to/from [facility] Sit to Stand: Min guard;+2 safety/equipment Stand pivot transfers: Min guard;+2 safety/equipment       General transfer comment: Patient demonstrating greatly improved sequencing and ability to follow commands.  No posterior lean noted.  Patient able to pace self appropriately and maintain attention to task for successful completion.    Balance               Standing balance comment: uses RW                           ADL either performed or assessed with clinical judgement   ADL Overall ADL's : Needs assistance/impaired Eating/Feeding: Supervision/ safety;Cueing for sequencing;Sitting Eating/Feeding Details (indicate cue type and reason): Demonstrated improved ability to open containers and initiate task Grooming: Wash/dry hands;Wash/dry face;Set up;Supervision/safety;Sitting Grooming Details (indicate cue type and reason): Improved ability to initiate and complete task         Upper Body Dressing : Minimal assistance;Sitting Upper Body Dressing Details (indicate cue type and reason): MIN A needed to tie gown.  Able to thread over B UEs with VC for initiation                   General ADL Comments: Demonstrated greatly improved sequencing of tasks and ability to self initiate appropriately     Vision   Vision Assessment?: No apparent visual deficits   Perception     Praxis      Cognition Arousal/Alertness: Awake/alert Behavior During Therapy: WFL for tasks assessed/performed Overall Cognitive Status: Impaired/Different from baseline  General Comments: Patient demonstrated overall improved cognition regarding sequencing, processing, following commands and maintaining attention to task.  Some deficits in memory still noted with repeated questions.        Exercises Other Exercises Other Exercises:  Patient OOB to eat breakfast.  Performed bed mobility with SBA and VCs.  Sit<>stand with CGA x 2, SPT to recliner using RW with CGA x 2.  Therapist providing cues on sequencing, body mechanics and safety. Other Exercises: Pt performed simple grooming/hygiene tasks while seated (i.e. wash hands and face) with SBA/set up demonstrating improved initiation of task and completion. Other Exercises: NT present for duration of session with demonstration of functional transfers and appropriate cuing for patient. Other Exercises: Set up/SPV required for self feeding.  patient demonstrated improved ability to open containers and pace self appropriately through task.   Shoulder Instructions       General Comments      Pertinent Vitals/ Pain       Pain Assessment: No/denies pain  Home Living                                          Prior Functioning/Environment              Frequency  Min 2X/week        Progress Toward Goals  OT Goals(current goals can now be found in the care plan section)  Progress towards OT goals: Progressing toward goals     Plan Discharge plan remains appropriate;Frequency remains appropriate    Co-evaluation                 AM-PAC OT "6 Clicks" Daily Activity     Outcome Measure   Help from another person eating meals?: A Little Help from another person taking care of personal grooming?: A Little Help from another person toileting, which includes using toliet, bedpan, or urinal?: A Lot Help from another person bathing (including washing, rinsing, drying)?: A Lot Help from another person to put on and taking off regular upper body clothing?: A Little Help from another person to put on and taking off regular lower body clothing?: A Lot 6 Click Score: 15    End of Session Equipment Utilized During Treatment: Gait belt;Rolling walker  OT Visit Diagnosis: Muscle weakness (generalized) (M62.81);Unsteadiness on feet (R26.81)    Activity Tolerance Patient tolerated treatment well   Patient Left in chair;with call bell/phone within reach;with chair alarm set   Nurse Communication Precautions;Other (comment)(Discussed appropriate cuing for functional transfers and safety)        Time: 5456-2563 OT Time Calculation (min): 19 min  Charges: OT General Charges $OT Visit: 1 Visit OT Treatments $Self Care/Home Management : 8-22 mins  Baldomero Lamy, MS, OTR/L 02/17/20, 10:16 AM
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
## 2020-02-17 NOTE — Progress Notes (Signed)
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 Aortic Valve: The aortic valve was not well visualized. Aortic valve regurgitation not well-assessed. Aortic stenosis was not well-assessed. Pulmonic Valve: The pulmonic valve was not well visualized. Pulmonic valve regurgitation is not visualized. No evidence of pulmonic stenosis. Aorta: The aortic root is normal in size and structure. Pulmonary Artery: The pulmonary artery is not well seen. Venous: The inferior vena cava was not well visualized. IAS/Shunts: The interatrial septum was not well visualized. Agitated saline contrast was given intravenously to evaluate for intracardiac shunting.  LEFT VENTRICLE PLAX 2D LVIDd:         3.33 cm LVIDs:         2.22 cm LV PW:         0.95 cm LV IVS:        0.83 cm LVOT diam:     2.00 cm LVOT Area:     3.14 cm  LEFT ATRIUM         Index LA diam:    1.80 cm 1.12 cm/m                        PULMONIC VALVE AORTA                 RVOT Peak grad: 3 mmHg Ao Root diam: 3.10 cm   SHUNTS Systemic Diam: 2.00 cm Nelva Bush Duane Santos Electronically signed by Nelva Bush Duane Santos Signature Date/Time: 02/11/2020/4:44:22 PM    Final    CT Super D Chest W Contrast  Result Date: 02/17/2020 CLINICAL DATA:  74 year old male with altered mental status, history of multifocal pneumonia or in the setting of myasthenia gravis.  EXAM: CT CHEST WITH CONTRAST TECHNIQUE: Multidetector CT imaging of the chest was performed using thin slice collimation for electromagnetic bronchoscopy planning purposes, with intravenous contrast. CONTRAST:  14mL OMNIPAQUE IOHEXOL 300 MG/ML  SOLN COMPARISON:  Prior PET-CT of 01/24/2020 FINDINGS: Cardiovascular: Ascending dilated  to 3.5 cm. Scattered atherosclerosis summer is calcification. Heart size is normal without pericardial effusion. Scattered coronary artery calcification. Central pulmonary vasculature is normal. Mediastinum/Nodes: Small lymph nodes throughout the mediastinum unchanged since recent imaging studies largest 7 mm along the right paratracheal chain. Other smaller nodes track towards the thoracic inlet. No thoracic inlet adenopathy. No axillary lymphadenopathy. No nodal enlargement beyond a cm in the bilateral hila. Correlate with recent PET exam which shows right hilar FDG uptake. Lungs/Pleura: Signs of ground-glass, diminished in general since the previous exam now with more peripheral predominance. Right lower lobe nodule found to be FDG avid in the medial right lung base (image 242, series 5) 1.6 by 0.7 cm not changed accounting for the degree of respiratory motion and was present on the CT angiography there was performed January of 2021 though not well seen on this study due to the motion artifact. Spiculated nodule in the superior segment of the right lower lobe with some central area of added density suggesting some calcification. This measures 2.1 by 2.0 cm, showing a similar appearance. Development of some bronchiectatic changes since the prior study in the left upper lobe, lingula and left lower lobe. Septal thickening is more pronounced also with dilation of left upper lobe bronchi. Process shows a more apical and peripheral than basilar predominance and is occurred in the short interval. No consolidation. No pleural effusion. Upper Abdomen: Incidental imaging of upper abdominal  contents is unremarkable. Musculoskeletal: Unchanged appearance of sclerotic foci and right-sided ribs. Signs of prior fracture to left-sided ribs midthoracic compression fractures without interval change. IMPRESSION: 1. Signs of bronchiectatic changes and septal thickening more so than the ground-glass that was seen on the previous exam. Bronchiectasis was present previously and may be slightly worse on today's exam though the lung parenchyma is better evaluated on today's study. Post inflammatory fibrosis is considered, by report in the medical record the patient has a history of COVID infection. 2. Spiculated nodule superior segment right lower lobe with central area of added density suggesting some calcification, wall finding remains concerning for bronchogenic neoplasm the possibility of sequela of granulomatous infection could also be considered. Biopsy is suggested. 3. Correlate with recent PET exam which shows right hilar FDG uptake. 4. Stable appearance of sclerotic foci in the right-sided ribs. 5. 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 Recommend annual imaging followup by CTA or MRA. This recommendation follows 2010 ACCF/AHA/AATS/ACR/ASA/SCA/SCAI/SIR/STS/SVM Guidelines for the Diagnosis and Management of Patients with Thoracic Aortic Disease. Circulation.2010; 121: N361-W431. Aortic aneurysm NOS (ICD10-I71.9) 6. Pulmonary embolism seen on prior exam is not demonstrated on the current exam. 7. Scattered coronary artery calcification. Electronically Signed   By: Zetta Bills M.D.   On: 02/17/2020 15:29     ASSESSMENT/PLAN   Right lower lobe 2cm nodule -patient wishes to move forward with PFT  -s/p MRI brain-abnormal -discussed with neurologist-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 -he will need to be off Eliquis for 7d prior to procedure -he wants to be DNR -Palliative evaluation is inprogress    Postobstructive  pneumonia -Empirically on vancomycin/Zosyn-nasal MRSA PCR was negative and vancomycin has been DC'd  -Infectious disease on case-appreciate input  -Crypto antigen elevated - LP ordered  COVID19 infection - patient is no longer hypoxemic and is able to have saturations over 90% while at rest without using supplemental oxygen -we will order O2 concentrator   Severe hyponatremia   - need to replete this I suggest  salt tabs and repeat BMP-improved  -monitoring Na levels-nephrology is on case appreciate input sodium has improved significantly.  Altered mental status  - likely due to hyponatremia  -patient is improving -Neurology consultation - discussed case with Dr. Irish Elders- s/p EEG and MRI.  AMS is thought to be not due to [facility] PT/OT evaluation  -Psychiatric evaluation per family request-appreciate input-needs to be more medically stabilized prior to appropriate evaluation.  Myesthenia Gravis   - continue with prednisone 20mg  po daily  - s/p neuro evaluation - possible mestinon therapy soon    Pulmonary embolism - patient on eliquis- will hold for now. He is on Lovenox now, we will stop this on Wednesday 02/16/20 -Have stopped Eliquis prior to hospitalization in preparation for ENB/EBUS      Thank you for allowing me to participate in the care of this patient.   Patient/Family are satisfied with care plan and all questions have been answered.  This document was prepared using Dragon voice recognition software and may include unintentional dictation errors.     Ottie Glazier, M.D.  Division of [place]

## 2020-02-17 NOTE — Progress Notes (Signed)
Rehab Admissions Coordinator Note:  Per CSW request, patient was screened by Michel Santee for appropriateness for an Inpatient Acute Rehab Consult.  At this time, we are recommending Inpatient Rehab consult.  I will place an order for consult, per our protocol.   Michel Santee 02/17/2020, 12:04 PM  I can be reached at [phone].

## 2020-02-17 NOTE — Progress Notes (Signed)
PROGRESS NOTE    Duane Santos  YTK:[id number] DOB: 04-Apr-1946 DOA: 02/09/2020 PCP: [facility]      Assessment & Plan:   Principal Problem:   Acute confusional state Active Problems:   Multifocal pneumonia   Myasthenia gravis ([place])   Right lower lobe lung mass   Generalized weakness   Hyponatremia   History of COVID-19 pneumonia with hypoxia   Acute metabolic encephalopathy   Chronic respiratory failure with hypoxia ([place])   Palliative care encounter   Acute confusional state: etiology unclear, subacute CVA vs mets as per MRI. Intermittent. Continue supportive care. Echo shows EF 65-70%, interatrial septum was not well visualized.   Subacute CVA vs metastatic disease: as per MRI. Continue w/ neuro checks. PT/OT recommending SNF  Multifocal pneumonia: w/ recurrent fevers & probable hospital-acquired, immunocompromised state. No fever overnight. 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 Continue fluid restriction and salt tablets.   Right lung mass: suspicious for malignancy, sclerotic changes of the right third fourth and fifth ribs suspicious for metastatic disease. Plan for biopsy on 02/18/20. Eliquis be held for 7 days prior to biopsy.  Follow-up with palliative care. Pulmon following and recs apprec  Recent pulmonary embolism and DVT: diagnosed on December 23, 2019. Eliquis has been held because of plan for biopsy.  Continue lovenox bridge for treatment of pulmonary embolism.  Myasthenia gravis: continue prednisone (his wife said his primary neurologist recommended that the dose should not be below 20 mg daily)  Leukocytosis: steroids induced vs infection. Will continue to monitor   Recent COVID-19 pneumonia/chronic hypoxemic respiratory failure: Continue supplemental oxygen   Depression: psychiatry consulted as per wife's request. Psych following and recs apprec   Generalized weakness/debility: PT and OT recommends  SNF Body mass index is 19.21    DVT prophylaxis: lovenox Code Status: DNR Family Communication: pt's wife, Maudie Mercury, was not at bedside when I saw the pt but I was told by the nurse that Maudie Mercury was at bedside so I went to see Maudie Mercury but Maudie Mercury was not there; I called Maudie Mercury, no answer so I left a message  Disposition Plan: will likely need several days more, bronchoscopy w/ biopsy on 02/18/20; will likely need to d/c to SNF   Consultants:   Pulmon  Palliative care  Psychiatry   nephro  neuro   Procedures:    Antimicrobials: zosyn   Subjective: Pt c/o back pain   Objective: Vitals:   02/16/20 0731 02/16/20 0900 02/16/20 1606 02/16/20 2351  BP: (!) 141/87  129/87 (!) 148/95  Pulse: (!) 102  (!) 110 100  Resp: 17  17 16   Temp: 98.4 F (36.9 C)  97.9 F (36.6 C) 98.9 F (37.2 C)  TempSrc: Oral  Oral Oral  SpO2: 99% 97% 100% 100%  Weight:      Height:        Intake/Output Summary (Last 24 hours) at 02/17/2020 0743 Last data filed at 02/17/2020 7517 Gross per 24 hour  Intake --  Output 200 ml  Net -200 ml   Filed Weights   02/09/20 1656  Weight: 54 kg    Examination:  General exam: Appears calm and comfortable  Respiratory system: decreased breath sounds b/l.  Cardiovascular system: S1 & S2. No rubs, gallops or clicks.  Gastrointestinal system: Abdomen is nondistended, soft and nontender. Normal bowel sounds heard.  Central nervous system: Alert and awake. Moves all 4 extremities  Psychiatry: Judgement and insight appear abnormal. Flat mood and  affect    Data Reviewed: I have personally reviewed following labs and imaging studies  CBC: Recent Labs  Lab 02/10/20 0802 02/13/20 0628 02/14/20 0818 02/17/20 0353  WBC 10.0 11.7* 11.1* 11.7*  NEUTROABS  --   --  9.8*  --   HGB 12.8* 12.1* 12.3* 12.8*  HCT 37.7* 35.7* 36.5* 38.6*  MCV 98.2 97.8 98.4 100.3*  PLT 279 274 273 400   Basic Metabolic Panel: Recent Labs  Lab 02/12/20 0503 02/13/20 0628 02/14/20 0818  02/16/20 0617 02/17/20 0353  NA 129* 134* 133* 139 145  K 3.7 3.8 3.7 4.0 4.1  CL 94* 93* 100 99 105  CO2 28 27 26 29 31   GLUCOSE 110* 114* 113* 103* 120*  BUN 17 20 22  24* 26*  CREATININE 0.77 0.74 0.71 0.95 0.79  CALCIUM 9.0 9.1 8.5* 9.5 9.7   GFR: Estimated Creatinine Clearance: 63.8 mL/min (by C-G formula based on SCr of 0.79 mg/dL). Liver Function Tests: No results for input(s): AST, ALT, ALKPHOS, BILITOT, PROT, ALBUMIN in the last 168 hours. No results for input(s): LIPASE, AMYLASE in the last 168 hours. No results for input(s): AMMONIA in the last 168 hours. Coagulation Profile: No results for input(s): INR, PROTIME in the last 168 hours. Cardiac Enzymes: No results for input(s): CKTOTAL, CKMB, CKMBINDEX, TROPONINI in the last 168 hours. BNP (last 3 results) No results for input(s): PROBNP in the last 8760 hours. HbA1C: No results for input(s): HGBA1C in the last 72 hours. CBG: No results for input(s): GLUCAP in the last 168 hours. Lipid Profile: No results for input(s): CHOL, HDL, LDLCALC, TRIG, CHOLHDL, LDLDIRECT in the last 72 hours. Thyroid Function Tests: No results for input(s): TSH, T4TOTAL, FREET4, T3FREE, THYROIDAB in the last 72 hours. Anemia Panel: No results for input(s): VITAMINB12, FOLATE, FERRITIN, TIBC, IRON, RETICCTPCT in the last 72 hours. Sepsis Labs: No results for input(s): PROCALCITON, LATICACIDVEN in the last 168 hours.  Recent Results (from the past 240 hour(s))  CULTURE, BLOOD (ROUTINE X 2) w Reflex to ID Panel     Status: None (Preliminary result)   Collection Time: 02/12/20  3:59 PM   Specimen: BLOOD  Result Value Ref Range Status   Specimen Description BLOOD RAC  Final   Special Requests   Final    BOTTLES DRAWN AEROBIC AND ANAEROBIC Blood Culture results may not be optimal due to an excessive volume of blood received in culture bottles   Culture   Final    NO GROWTH 4 DAYS Performed at [facility], [address],  [place], [place] [address]    Report Status PENDING  Incomplete  CULTURE, BLOOD (ROUTINE X 2) w Reflex to ID Panel     Status: None (Preliminary result)   Collection Time: 02/12/20  4:07 PM   Specimen: BLOOD RIGHT HAND  Result Value Ref Range Status   Specimen Description BLOOD RIGHT HAND [facility]  Final   Special Requests   Final    BOTTLES DRAWN AEROBIC AND ANAEROBIC Blood Culture adequate volume   Culture   Final    NO GROWTH 4 DAYS Performed at [facility], [address] [place], [place] [address]    Report Status PENDING  Incomplete  MRSA PCR Screening     Status: None   Collection Time: 02/13/20  3:03 PM   Specimen: Nasal Mucosa; Nasopharyngeal  Result Value Ref Range Status   MRSA by PCR NEGATIVE NEGATIVE Final    Comment:  The GeneXpert MRSA Assay (FDA approved for NASAL specimens only), is one component of a comprehensive MRSA colonization surveillance program. It is not intended to diagnose MRSA infection nor to guide or monitor treatment for MRSA infections. Performed at [facility], [address], [place],  [address]          Radiology Studies: No results found.      Scheduled Meds: . feeding supplement (ENSURE ENLIVE)  237 mL Oral BID BM  . multivitamin with minerals  1 tablet Oral Daily  . predniSONE  20 mg Oral Q breakfast  . sodium chloride  1 g Oral TID WC  . thiamine  100 mg Oral Daily   Continuous Infusions: . piperacillin-tazobactam (ZOSYN)  IV 3.375 g (02/17/20 0525)     LOS: 6 days    Time spent: 30 mins     Wyvonnia Dusky, MD [facility] Pager [phone]  If 7PM-7AM, please contact night-coverage [email or web address] 02/17/2020, 7:43 AM

## 2020-02-18 ENCOUNTER — Inpatient Hospital Stay: Payer: Self-pay

## 2020-02-18 ENCOUNTER — Inpatient Hospital Stay: Payer: Medicare HMO

## 2020-02-18 ENCOUNTER — Encounter: Payer: Self-pay | Admitting: Internal Medicine

## 2020-02-18 ENCOUNTER — Other Ambulatory Visit: Payer: Self-pay | Admitting: Diagnostic Radiology

## 2020-02-18 ENCOUNTER — Encounter
Admission: EM | Disposition: A | Discharge status: Rehabilitation Facility | Payer: Self-pay | Source: Home / Self Care | Attending: Internal Medicine

## 2020-02-18 ENCOUNTER — Inpatient Hospital Stay: Payer: Medicare HMO | Admitting: Certified Registered"

## 2020-02-18 HISTORY — PX: BIOPSY OF MEDIASTINAL MASS: SHX6389

## 2020-02-18 LAB — BASIC METABOLIC PANEL
Anion gap: 10 (ref 5–15)
BUN: 27 mg/dL — ABNORMAL HIGH (ref 8–23)
CO2: 29 mmol/L (ref 22–32)
Calcium: 9.5 mg/dL (ref 8.9–10.3)
Chloride: 106 mmol/L (ref 98–111)
Creatinine, Ser: 0.76 mg/dL (ref 0.61–1.24)
GFR calc Af Amer: >60 mL/min (ref >60)
GFR calc non Af Amer: >60 mL/min (ref >60)
Glucose, Bld: 101 mg/dL — ABNORMAL HIGH (ref 70–99)
Potassium: 3.9 mmol/L (ref 3.5–5.1)
Sodium: 145 mmol/L (ref 135–145)

## 2020-02-18 LAB — ASPERGILLUS ANTIGEN, BAL/SERUM: Aspergillus Ag, BAL/Serum: 0.03 Index (ref 0.00–0.49)

## 2020-02-18 LAB — CSF CELL COUNT WITH DIFFERENTIAL
Eosinophils, CSF: 0 %
Eosinophils, CSF: 0 %
Lymphs, CSF: 75 %
Lymphs, CSF: 84 %
Monocyte-Macrophage-Spinal Fluid: 14 %
Monocyte-Macrophage-Spinal Fluid: 20 %
RBC Count, CSF: 161 /mm3 — ABNORMAL HIGH (ref 0–3)
RBC Count, CSF: 53 /mm3 — ABNORMAL HIGH (ref 0–3)
Segmented Neutrophils-CSF: 2 %
Segmented Neutrophils-CSF: 5 %
Tube #: 1
Tube #: 4
WBC, CSF: 60 /mm3 (ref 0–5)
WBC, CSF: 76 /mm3 (ref 0–5)

## 2020-02-18 LAB — CBC
HCT: 40.5 % (ref 39.0–52.0)
Hemoglobin: 12.6 g/dL — ABNORMAL LOW (ref 13.0–17.0)
MCH: 32.7 pg (ref 26.0–34.0)
MCHC: 31.1 g/dL (ref 30.0–36.0)
MCV: 105.2 fL — ABNORMAL HIGH (ref 80.0–100.0)
Platelets: 263 10*3/uL (ref 150–400)
RBC: 3.85 MIL/uL — ABNORMAL LOW (ref 4.22–5.81)
RDW: 15.6 % — ABNORMAL HIGH (ref 11.5–15.5)
WBC: 13.8 10*3/uL — ABNORMAL HIGH (ref 4.0–10.5)
nRBC: 0 % (ref 0.0–0.2)

## 2020-02-18 LAB — HIV ANTIBODY (ROUTINE TESTING W REFLEX): HIV Screen 4th Generation wRfx: NONREACTIVE

## 2020-02-18 LAB — LYME DISEASE, WESTERN BLOT
IgG P18 Ab.: ABSENT
IgG P23 Ab.: ABSENT
IgG P28 Ab.: ABSENT
IgG P30 Ab.: ABSENT
IgG P39 Ab.: ABSENT
IgG P41 Ab.: ABSENT
IgG P45 Ab.: ABSENT
IgG P58 Ab.: ABSENT
IgG P66 Ab.: ABSENT
IgG P93 Ab.: ABSENT
IgM P23 Ab.: ABSENT
IgM P39 Ab.: ABSENT
IgM P41 Ab.: ABSENT
Lyme IgG Wb: NEGATIVE
Lyme IgM Wb: NEGATIVE

## 2020-02-18 LAB — MAGNESIUM: Magnesium: 2.6 mg/dL — ABNORMAL HIGH (ref 1.7–2.4)

## 2020-02-18 LAB — FUNGITELL, SERUM: Fungitell Result: 32 pg/mL (ref <80)

## 2020-02-18 LAB — CRYPTOCOCCAL ANTIGEN, CSF
Crypto Ag: POSITIVE — AB
Cryptococcal Ag Titer: 2560 — AB

## 2020-02-18 LAB — PROTEIN AND GLUCOSE, CSF
Glucose, CSF: 21 mg/dL — CL (ref 40–70)
Total  Protein, CSF: 254 mg/dL — ABNORMAL HIGH (ref 15–45)

## 2020-02-18 LAB — GLUCOSE, CAPILLARY: Glucose-Capillary: 102 mg/dL — ABNORMAL HIGH (ref 70–99)

## 2020-02-18 SURGERY — BIOPSY, MASS, MEDIASTINUM
Anesthesia: General | Laterality: Bilateral

## 2020-02-18 MED ORDER — LIDOCAINE HCL (PF) 1 % IJ SOLN
30.0000 mL | Freq: Once | INTRAMUSCULAR | Status: DC
  Filled 2020-02-18: qty 30

## 2020-02-18 MED ORDER — PHENYLEPHRINE HCL (PRESSORS) 10 MG/ML IV SOLN
INTRAVENOUS | Status: AC
  Filled 2020-02-18: qty 1

## 2020-02-18 MED ORDER — LIDOCAINE HCL (CARDIAC) PF 100 MG/5ML IV SOSY
PREFILLED_SYRINGE | INTRAVENOUS | Status: DC | PRN
  Administered 2020-02-18: 60 mg via INTRAVENOUS

## 2020-02-18 MED ORDER — SUGAMMADEX SODIUM 200 MG/2ML IV SOLN
INTRAVENOUS | Status: DC | PRN
  Administered 2020-02-18: 200 mg via INTRAVENOUS

## 2020-02-18 MED ORDER — BUTAMBEN-TETRACAINE-BENZOCAINE 2-2-14 % EX AERO
1.0000 | INHALATION_SPRAY | Freq: Once | CUTANEOUS | Status: DC
  Filled 2020-02-18: qty 20

## 2020-02-18 MED ORDER — SODIUM CHLORIDE 0.9% FLUSH: 10.0000 mL | INTRAVENOUS | Status: DC | PRN

## 2020-02-18 MED ORDER — PREDNISONE 10 MG PO TABS
15.0000 mg | ORAL_TABLET | Freq: Every day | ORAL | Status: DC
  Administered 2020-02-19 – 2020-02-21 (×3): 15 mg via ORAL
  Filled 2020-02-18 (×3): qty 2

## 2020-02-18 MED ORDER — PROPOFOL 10 MG/ML IV BOLUS
INTRAVENOUS | Status: DC | PRN
  Administered 2020-02-18: 80 mg via INTRAVENOUS

## 2020-02-18 MED ORDER — ONDANSETRON HCL 4 MG/2ML IJ SOLN
INTRAMUSCULAR | Status: AC
  Filled 2020-02-18: qty 2

## 2020-02-18 MED ORDER — FENTANYL CITRATE (PF) 100 MCG/2ML IJ SOLN
INTRAMUSCULAR | Status: AC
  Filled 2020-02-18: qty 2

## 2020-02-18 MED ORDER — EPHEDRINE 5 MG/ML INJ
INTRAVENOUS | Status: AC
  Filled 2020-02-18: qty 10

## 2020-02-18 MED ORDER — SODIUM CHLORIDE 0.9% FLUSH
10.0000 mL | Freq: Two times a day (BID) | INTRAVENOUS | Status: DC
  Administered 2020-02-18 – 2020-02-24 (×10): 10 mL

## 2020-02-18 MED ORDER — EPHEDRINE SULFATE 50 MG/ML IJ SOLN
INTRAMUSCULAR | Status: DC | PRN
  Administered 2020-02-18: 5 mg via INTRAVENOUS

## 2020-02-18 MED ORDER — PHENYLEPHRINE HCL (PRESSORS) 10 MG/ML IV SOLN
INTRAVENOUS | Status: DC | PRN
  Administered 2020-02-18 (×2): 100 ug via INTRAVENOUS
  Administered 2020-02-18: 50 ug via INTRAVENOUS
  Administered 2020-02-18: 150 ug via INTRAVENOUS
  Administered 2020-02-18: 100 ug via INTRAVENOUS

## 2020-02-18 MED ORDER — ONDANSETRON HCL 4 MG/2ML IJ SOLN
INTRAMUSCULAR | Status: DC | PRN
  Administered 2020-02-18: 4 mg via INTRAVENOUS

## 2020-02-18 MED ORDER — PHENYLEPHRINE HCL 0.25 % NA SOLN
1.0000 | Freq: Four times a day (QID) | NASAL | Status: DC | PRN
  Filled 2020-02-18: qty 15

## 2020-02-18 MED ORDER — LIDOCAINE HCL URETHRAL/MUCOSAL 2 % EX GEL
1.0000 "application " | Freq: Once | CUTANEOUS | Status: DC
  Filled 2020-02-18: qty 5

## 2020-02-18 MED ORDER — SODIUM CHLORIDE 0.9 % IV SOLN
INTRAVENOUS | Status: DC | PRN
  Administered 2020-02-18: 50 ug/min via INTRAVENOUS

## 2020-02-18 MED ORDER — APIXABAN 5 MG PO TABS
5.0000 mg | ORAL_TABLET | Freq: Two times a day (BID) | ORAL | Status: DC
  Administered 2020-02-20 – 2020-02-24 (×8): 5 mg via ORAL
  Filled 2020-02-18 (×8): qty 1

## 2020-02-18 MED ORDER — SODIUM CHLORIDE FLUSH 0.9 % IV SOLN
INTRAVENOUS | Status: AC
  Filled 2020-02-18: qty 10

## 2020-02-18 MED ORDER — PROPOFOL 10 MG/ML IV BOLUS
INTRAVENOUS | Status: AC
  Filled 2020-02-18: qty 20

## 2020-02-18 MED ORDER — CHLORHEXIDINE GLUCONATE CLOTH 2 % EX PADS
6.0000 | MEDICATED_PAD | Freq: Every day | CUTANEOUS | Status: DC
  Administered 2020-02-19 – 2020-02-24 (×6): 6 via TOPICAL

## 2020-02-18 MED ORDER — FENTANYL CITRATE (PF) 100 MCG/2ML IJ SOLN
INTRAMUSCULAR | Status: DC | PRN
  Administered 2020-02-18 (×3): 25 ug via INTRAVENOUS
  Administered 2020-02-18: 75 ug via INTRAVENOUS

## 2020-02-18 MED ORDER — LACTATED RINGERS IV SOLN: INTRAVENOUS | Status: DC | PRN

## 2020-02-18 MED ORDER — ROCURONIUM BROMIDE 10 MG/ML (PF) SYRINGE
PREFILLED_SYRINGE | INTRAVENOUS | Status: AC
  Filled 2020-02-18: qty 10

## 2020-02-18 MED ORDER — ROCURONIUM BROMIDE 100 MG/10ML IV SOLN
INTRAVENOUS | Status: DC | PRN
  Administered 2020-02-18: 10 mg via INTRAVENOUS
  Administered 2020-02-18: 15 mg via INTRAVENOUS
  Administered 2020-02-18: 25 mg via INTRAVENOUS

## 2020-02-18 MED ORDER — PIPERACILLIN-TAZOBACTAM 3.375 G IVPB 30 MIN
3.3750 g | Freq: Once | INTRAVENOUS | Status: AC
  Administered 2020-02-18: 3.375 g via INTRAVENOUS
  Filled 2020-02-18: qty 50

## 2020-02-18 MED ORDER — LIDOCAINE HCL (PF) 2 % IJ SOLN
INTRAMUSCULAR | Status: AC
  Filled 2020-02-18: qty 10

## 2020-02-18 NOTE — Progress Notes (Signed)
Inpatient Rehab Admissions:  Inpatient Rehab Consult received.  I spoke with pt's wife for rehabilitation assessment and to discuss goals and expectations of an inpatient rehab admission.  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  LP results pending.  She is open to intense multidisciplinary rehab for pt when medically stable.  I will start insurance prior authorization for possible admission Monday or Tuesday pending approval.  Will need to update OT notes on Monday once they're able to see pt over the weekend.   Signed: Shann Medal, PT, DPT Admissions Coordinator [phone] 02/18/20  4:26 PM

## 2020-02-18 NOTE — Progress Notes (Addendum)
Pharmacy - Liposomal Amphotericin Monitoring  23 YOM with myasthenia gravis on prednisone/mycophenolate.  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  Started on amphotericin - liposomal and flucytosine evening on 3/1. Patient for lung biopsy and LP today.    Labs 02/18/2020 - K = 3.9 - Mg = 2.6 - SCr = 0.76   Plan:  Continue Ampohtericin 200mg  (3.7mg /kg) IV q24h  Normal saline 56ml bolus pre and post dose with D5W 50ml flushes in between boluses  Flucytosine 1250mg  (23mg /kg) PO q6h   Check daily BMP and Mg and replete K+ and Mg daily as needed as both amphotericin and flucytosine can cause hypokalemia  Monitor renal function  Monitor CBC and LFTs periodically (flucytosine can cause hepatoxicity and bone marrow suppression).   Awaiting LP  HIV pending  Zosyn 3.375gm IV q8h over 4h infusion remains appropriate  Doreene Eland, PharmD, BCPS.   Work Cell: [phone] 02/18/2020 8:15 AM

## 2020-02-18 NOTE — TOC Progression Note (Signed)
Transition of Care [facility]) - Progression Note    Patient Details  Name: Duane Santos MRN: [id number] Date of Birth: 07/09/1946  [facility] CM/SW Contact  Jemal Miskell, [place] Rhyme, LCSW Phone Number: 02/18/2020, 10:22 AM  Clinical Narrative:   Spoke with Caitlyn-CIR who reports they feel he is appropriate for CIR and will start insurance auth which typically takes three days. Have contacted wife to let her know. She also wants to think about discharge home. Aware will need 24 hr care and needed equipment. Pt currently in lung biopsy. Continue to work on best discharge plan for pt. Wife aware Caitlyn to call her later this afternoon to discuss CIR     Expected Discharge Plan: [place] Barriers to Discharge: Continued Medical Work up  Expected Discharge Plan and Services Expected Discharge Plan: [place] In-house Referral: Clinical Social Work   Post Acute Care Choice: Home Health, Durable Medical Equipment Living arrangements for the past 2 months: Single Family Home Expected Discharge Date: 02/15/20                                     Social Determinants of Health (SDOH) Interventions    Readmission Risk Interventions Readmission Risk Prevention Plan 12/30/2019  Transportation Screening Complete  Home Care Screening Complete  Medication Review (RN CM) Complete  Some recent data might be hidden
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
## 2020-02-18 NOTE — Anesthesia Procedure Notes (Addendum)
Procedure Name: Intubation Date/Time: 02/18/2020 8:22 AM Performed by: Jerrye Noble, CRNA Pre-anesthesia Checklist: Patient identified, Emergency Drugs available, Suction available and Patient being monitored Patient Re-evaluated:Patient Re-evaluated prior to induction Oxygen Delivery Method: Circle system utilized Preoxygenation: Pre-oxygenation with 100% oxygen Induction Type: IV induction Ventilation: Mask ventilation without difficulty Laryngoscope Size: McGraph and 3 Grade View: Grade I Tube type: Oral Tube size: 9.0 mm Number of attempts: 1 Airway Equipment and Method: Stylet and LTA kit utilized Placement Confirmation: ETT inserted through vocal cords under direct vision,  positive ETCO2 and breath sounds checked- equal and bilateral Secured at: 22 cm Tube secured with: Tape Dental Injury: Teeth and Oropharynx as per pre-operative assessment

## 2020-02-18 NOTE — Progress Notes (Signed)
Physical Therapy Treatment Patient Details Name: RASHARD RYLE MRN: [id number] DOB: 01/06/1946 Today's Date: 02/18/2020    History of Present Illness presented to ER secondary to AMS, progressive weakness and repeated falls; admitted for management of acute metabolic encephalopathy.  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 PMH significant for MG, covid-19 infection (1/21) and newly-diagnosed R lung cancer.  s/p lung biopsy, pending LP for continued work up.    PT Comments    Patient returned from biopsy this morning; additional orders received for continuation of care.  Patient awake and alert upon arrival to room; oriented to self only.  Generally follows very simple, one-step commands, but often requires therapist demonstration and hand-over-hand facilitation for full comprehension of task/request.  Very distractible by external environment. Patient with improved functional mobility (compared to previous evaluation), requiring min assist +1 with RW for sit/stand, basic transfers and gait (30').  Does require use of RW and +1 for optimal safety with balance/mobility; max/dep assist for walker management, with therapist guiding walker to facilitate automatic stepping pattern and pathway negotiation.  Does require use of brief with any/all standing activities due to bowel/bladder incontinence; left on end of session for transport off floor per primary RN request. Difficulty with carry-over of new information; will continue to reinforce education and safe movement patterns in subsequent sessions. Leaving floor for LP end of session; per discussion with ID, anticipate marked improvement in cognition and overall performance post-LP.    Follow Up Recommendations  (CIR per wife request)     Equipment Recommendations  Rolling walker with 5" wheels;3in1 (PT)    Recommendations for Other Services       Precautions / Restrictions  Precautions Precautions: Fall Restrictions Weight Bearing Restrictions: No    Mobility  Bed Mobility Overal bed mobility: Needs Assistance Bed Mobility: Supine to Sit     Supine to sit: Mod assist     General bed mobility comments: hand-over-hand to initiate movement of any/all extremities with movement transition; follows automatic movement patterns with facilitation from therapist  Transfers Overall transfer level: Needs assistance Equipment used: Rolling walker (2 wheeled) Transfers: Sit to/from Stand Sit to Stand: Min assist Stand pivot transfers: Min assist       General transfer comment: cga/min assist for sit/stand and standing balance; max/dep assist for walker position and management, using RW to guide patient movement  Ambulation/Gait Ambulation/Gait assistance: Min assist Gait Distance (Feet): 30 Feet         General Gait Details: partially reciprocal stepping pattern, fair balance; max/dep assist for walker management and task direction, pathway negotiation.  SaO2 95% on 2L, HR 136 with gait   Stairs             Wheelchair Mobility    Modified Rankin (Stroke Patients Only)       Balance Overall balance assessment: Needs assistance Sitting-balance support: No upper extremity supported;Feet supported Sitting balance-Leahy Scale: Good     Standing balance support: Bilateral upper extremity supported Standing balance-Leahy Scale: Fair Standing balance comment: RW and +1 for optimal safety                            Cognition Arousal/Alertness: Awake/alert Behavior During Therapy: Flat affect                                   General  Comments: oriented to self; follows very simple, verbal commands but requires incresaed time for processing and often therapist demonstration/hand-over-hand to fully comprehend and initiate/complete      Exercises Other Exercises Other Exercises: Sit/stand from various surfaces  (edge of bed, BSC) with RW, min assist +1. Hand-over-hand assist for UE placement, movement sequencing and initiation.  With initial sit/stand, patient noted with incontinent bowel/bladder episode (patient unaware); dep assist for hygiene/peri-care and clean up of episode.  Brief donned for additional OOB/mobility activities during session; requested to leave on patient as leaving unit for LP per RN. Other Exercises: SPT, bed/BSC, with RW, min assist; max/dep management of RW to direct task    General Comments        Pertinent Vitals/Pain Pain Assessment: No/denies pain    Home Living                      Prior Function            PT Goals (current goals can now be found in the care plan section) Acute Rehab PT Goals Patient Stated Goal: agreeable to session PT Goal Formulation: With patient Time For Goal Achievement: 02/25/20 Potential to Achieve Goals: Fair Progress towards PT goals: Progressing toward goals    Frequency    Min 2X/week      PT Plan Current plan remains appropriate    Co-evaluation              AM-PAC PT "6 Clicks" Mobility   Outcome Measure  Help needed turning from your back to your side while in a flat bed without using bedrails?: A Little Help needed moving from lying on your back to sitting on the side of a flat bed without using bedrails?: A Lot Help needed moving to and from a bed to a chair (including a wheelchair)?: A Little Help needed standing up from a chair using your arms (e.g., wheelchair or bedside chair)?: A Little Help needed to walk in hospital room?: A Little Help needed climbing 3-5 steps with a railing? : A Little 6 Click Score: 17    End of Session Equipment Utilized During Treatment: Gait belt;Oxygen   Patient left: in bed;with call bell/phone within reach;with bed alarm set;with family/visitor present Nurse Communication: Mobility status PT Visit Diagnosis: Muscle weakness (generalized) (M62.81);Other  abnormalities of gait and mobility (R26.89);Unsteadiness on feet (R26.81);Other symptoms and signs involving the nervous system (R29.898);Repeated falls (R29.6)     Time: 6160-7371 PT Time Calculation (min) (ACUTE ONLY): 43 min  Charges:  $Gait Training: 8-22 mins $Therapeutic Activity: 23-37 mins                     Inmer Nix H. Owens Shark, PT, DPT, NCS 02/18/20, 2:08 PM [phone]
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
## 2020-02-18 NOTE — Progress Notes (Signed)
OT Cancellation Note  Patient Details Name: CARA AGUINO MRN: [id number] DOB: 1945/12/12   Cancelled Treatment:    Reason Eval/Treat Not Completed: Other (comment);Patient at procedure or test/ unavailable   New order/consult received and chart reviewed.  Patient going to next procedure at this time and is unavailable.  Will follow up as able and as appropriate.  Thank you.  Oren Binet 02/18/2020, 1:40 PM

## 2020-02-18 NOTE — Progress Notes (Signed)
Nutrition Follow-up  DOCUMENTATION CODES:   Not applicable  INTERVENTION:  Continue dysphagia 3 diet with thin liquids.  Continue Ensure Enlive po TID, each supplement provides 350 kcal and 20 grams of protein.  Continue Magic cup TID with meals, each supplement provides 290 kcal and 9 grams of protein.  Continue daily MVI.  Continue 1:1 assistance at meals if wife is not available to assist.  NUTRITION DIAGNOSIS:   Increased nutrient needs related to catabolic illness(recent QPYPP-50 PNA, right lower lobe mass suspicious for malignancy) as evidenced by estimated needs.  Ongoing.  GOAL:   Patient will meet greater than or equal to 90% of their needs  Progressing.  MONITOR:   PO intake, Supplement acceptance, Labs, Weight trends, I & O's  REASON FOR ASSESSMENT:   Malnutrition Screening Tool    ASSESSMENT:   74 year old male with PMHx of myasthenia gravis, hx of COVID-19 PNA diagnosed 12/13/2019, right lower lobe mass suspicious for malignancy with sclerotic changes of right 3rd, 4th, and 5th ribs suspicious for metastatic disease, PE and DVT on Eliquis admitted with acute confusional state, worsening hyponatremia.  Patient was out of room for procedure (bronchoscopy) when RD first attempted to follow-up with patient. After he returned he was working with PT and there is another planned procedure this afternoon. Discussed with wife over the phone. She reports his appetite is improving but he does not like the food available in the hospital. He is eating about 25% of his meals now and still requires assistance with meals. She feeds him the fruit cup, milk, and then part of the meat and carbs, and a few bites of the vegetable. He is also enjoying the [employer]. Wife had requested yesterday the Ensure be increased to TID as patient really enjoys those and can drink 3 per day. Agree this is a good idea to help improve intake of calories and protein. She reports patient is tolerating  the dysphagia 3 diet.  Medications reviewed and include: MVI daily, prednisone 20 mg daily, thiamine 100 mg daily, Ambisome, Zosyn.  Labs reviewed: BUN 27, Magnesium 2.6.  Diet Order:   Diet Order            DIET DYS 3 Room service appropriate? Yes; Fluid consistency: Thin  Diet effective now             EDUCATION NEEDS:   No education needs have been identified at this time  Skin:  Skin Assessment: Reviewed RN Assessment  Last BM:  02/17/2020 small type 6  Height:   Ht Readings from Last 1 Encounters:  02/09/20 5\' 6"  (1.676 m)   Weight:   Wt Readings from Last 1 Encounters:  02/09/20 54 kg   Ideal Body Weight:  64.5 kg  BMI:  Body mass index is 19.21 kg/m.  Estimated Nutritional Needs:   Kcal:  1600-1800  Protein:  80-90 grams  Fluid:  1.2-1.5 L/day  Jacklynn Barnacle, MS, RD, LDN Pager number available on Amion

## 2020-02-18 NOTE — Progress Notes (Signed)
Peripherally Inserted Central Catheter/Midline Placement  The IV Nurse has discussed with the patient and/or persons authorized to consent for the patient, the purpose of this procedure and the potential benefits and risks involved with this procedure.  The benefits include less needle sticks, lab draws from the catheter, and the patient may be discharged home with the catheter. Risks include, but not limited to, infection, bleeding, blood clot (thrombus formation), and puncture of an artery; nerve damage and irregular heartbeat and possibility to perform a PICC exchange if needed/ordered by physician.  Alternatives to this procedure were also discussed.  Bard Power PICC patient education guide, fact sheet on infection prevention and patient information card has been provided to patient /or left at bedside.    PICC/Midline Placement Documentation  PICC Double Lumen 24/81/85 PICC Left Basilic 43 cm 1 cm (Active)  Indication for Insertion or Continuance of Line Poor Vasculature-patient has had multiple peripheral attempts or PIVs lasting less than 24 hours 02/18/20 1700  Exposed Catheter (cm) 1 cm 02/18/20 1700  Site Assessment Clean;Dry;Intact 02/18/20 1700  Lumen #1 Status Flushed;Blood return noted 02/18/20 1700  Lumen #2 Status Flushed;Blood return noted 02/18/20 1700  Dressing Type Transparent 02/18/20 1700  Dressing Status Clean;Dry;Intact;Antimicrobial disc in place 02/18/20 1700  Dressing Change Due 02/25/20 02/18/20 1700       Jule Economy Horton 02/18/2020, 5:59 PM

## 2020-02-18 NOTE — Progress Notes (Signed)
OT Cancellation Note  Patient Details Name: Duane Santos MRN: [id number] DOB: [birth year]   Cancelled Treatment:    Reason Eval/Treat Not Completed: Other (comment);Patient at procedure or test/ unavailable   Pt off of unit for biopsy.  Remains so at this time.  Due to general anesthesia, he will need new OT orders/eval or continue on transfer orders.  Will hold at this time.  MD had been messaged by therapy supervisor in regards to orders.  Will continue as appropriate as SWS is awaiting updated PT/OT notes to assist in d/c planning/disposition.  Oren Binet 02/18/2020, 11:39 AM

## 2020-02-18 NOTE — Progress Notes (Signed)
PROGRESS NOTE    Duane Santos  UDJ:[id number] DOB: [birth year] DOA: 02/09/2020 PCP: [place]      Assessment & Plan:   Principal Problem:   Acute confusional state Active Problems:   Multifocal pneumonia   Myasthenia gravis ([place])   Right lower lobe lung mass   Generalized weakness   Hyponatremia   History of COVID-19 pneumonia with hypoxia   Acute metabolic encephalopathy   Chronic respiratory failure with hypoxia ([place])   Palliative care encounter   Acute confusional state: etiology unclear, subacute CVA vs mets as per MRI. Intermittent. Continue supportive care. Echo shows EF 65-70%, interatrial septum was not well visualized. 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 Started on amphotericin B, flucytosine as per ID. LP w/ cultures/studies will be done as per neuro   Subacute CVA vs metastatic disease: as per MRI. Continue w/ neuro checks. PT/OT recommending SNF  Multifocal pneumonia: w/ recurrent fevers & probable hospital-acquired, immunocompromised state. Fever overnight. 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 Continue fluid restriction and salt tablets.   Right lung mass: suspicious for malignancy, sclerotic changes of the right third fourth and fifth ribs suspicious for metastatic disease.  S/p bronchoscopy w/ biopsy on 02/18/20, path pending. Eliquis be held for 7 days prior to biopsy.  Follow-up with palliative care. Pulmon following and recs apprec  Recent pulmonary embolism and DVT: diagnosed on December 23, 2019. Eliquis has been held because of plan for biopsy today.  Continue lovenox bridge for treatment of pulmonary embolism.  Myasthenia gravis: continue prednisone (his wife said his primary neurologist recommended that the dose should not be below 20 mg daily)  Leukocytosis: steroids induced vs infection. 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 Recommend annual imaging followup by CTA or MRA  Depression: psychiatry consulted as per wife's request. Psych following and recs apprec   Generalized weakness/debility: PT and OT recommends SNF Body mass index is 19.21    DVT prophylaxis: lovenox Code Status: DNR Family Communication: discussed pt's care w/ pt's wife, Maudie Mercury, at bedside and answered her questions  Disposition Plan:   Consultants:   Pulmon  Palliative care  Psychiatry   nephro  neuro   Procedures:    Antimicrobials: zosyn   Subjective: Pt c/o fatigue  Objective: Vitals:   02/17/20 1842 02/17/20 2022 02/17/20 2306 02/18/20 0710  BP:  133/67 (!) 144/84 (!) 150/87  Pulse: (!) 109 (!) 103 83 (!) 103  Resp: 20 (!) 24 (!) 22 (!) 22  Temp: 100.1 F (37.8 C) 98 F (36.7 C) 97.6 F (36.4 C) (!) 97.4 F (36.3 C)  TempSrc: Oral Oral Oral Temporal  SpO2: 100% 100% 100% 100%  Weight:      Height:        Intake/Output Summary (Last 24 hours) at 02/18/2020 0811 Last data filed at 02/18/2020 0100 Gross per 24 hour  Intake 1568.54 ml  Output --  Net 1568.54 ml   Filed Weights   02/09/20 1656  Weight: 54 kg    Examination:  General exam: Appears calm and comfortable  Respiratory system: diminished breath sounds b/l.  Cardiovascular system: S1 & S2. No rubs, gallops or clicks.  Gastrointestinal system: Abdomen is nondistended, soft and nontender. Hypoactive bowel sounds heard.  Central nervous system: Alert and awake. Moves all 4 extremities  Psychiatry: Judgement and insight appear abnormal. Flat mood  and affect    Data Reviewed: I have personally reviewed following labs and imaging studies  CBC: Recent Labs  Lab 02/13/20 0628 02/14/20 0818 02/17/20 0353 02/18/20 0603  WBC 11.7* 11.1* 11.7* 13.8*  NEUTROABS  --  9.8*  --   --   HGB 12.1* 12.3* 12.8* 12.6*  HCT 35.7* 36.5* 38.6* 40.5  MCV 97.8 98.4 100.3*  105.2*  PLT 274 273 297 829   Basic Metabolic Panel: Recent Labs  Lab 02/13/20 0628 02/14/20 0818 02/16/20 0617 02/17/20 0353 02/18/20 0603  NA 134* 133* 139 145 145  K 3.8 3.7 4.0 4.1 3.9  CL 93* 100 99 105 106  CO2 _0 GLUCOSE 114* 113* 103* 120* 101*  BUN 20 22 24* 26* 27*  CREATININE 0.74 0.71 0.95 0.79 0.76  CALCIUM 9.1 8.5* 9.5 9.7 9.5  MG  --   --   --   --  2.6*   GFR: Estimated Creatinine Clearance: 63.8 mL/min (by C-G formula based on SCr of 0.76 mg/dL). Liver Function Tests: No results for input(s): AST, ALT, ALKPHOS, BILITOT, PROT, ALBUMIN in the last 168 hours. No results for input(s): LIPASE, AMYLASE in the last 168 hours. No results for input(s): AMMONIA in the last 168 hours. Coagulation Profile: Recent Labs  Lab 02/17/20 1512  INR 0.9   Cardiac Enzymes: No results for input(s): CKTOTAL, CKMB, CKMBINDEX, TROPONINI in the last 168 hours. BNP (last 3 results) No results for input(s): PROBNP in the last 8760 hours. HbA1C: No results for input(s): HGBA1C in the last 72 hours. CBG: No results for input(s): GLUCAP in the last 168 hours. Lipid Profile: No results for input(s): CHOL, HDL, LDLCALC, TRIG, CHOLHDL, LDLDIRECT in the last 72 hours. Thyroid Function Tests: No results for input(s): TSH, T4TOTAL, FREET4, T3FREE, THYROIDAB in the last 72 hours. Anemia Panel: No results for input(s): VITAMINB12, FOLATE, FERRITIN, TIBC, IRON, RETICCTPCT in the last 72 hours. Sepsis Labs: No results for input(s): PROCALCITON, LATICACIDVEN in the last 168 hours.  Recent Results (from the past 240 hour(s))  CULTURE, BLOOD (ROUTINE X 2) w Reflex to ID Panel     Status: None   Collection Time: 02/12/20  3:59 PM   Specimen: BLOOD  Result Value Ref Range Status   Specimen Description BLOOD RAC  Final   Special Requests   Final    BOTTLES DRAWN AEROBIC AND ANAEROBIC Blood Culture results may not be optimal due to an excessive volume of blood received in  culture bottles   Culture   Final    NO GROWTH 5 DAYS Performed at [facility], [address] [place], [place] [address]    Report Status 02/17/2020 FINAL  Final  CULTURE, BLOOD (ROUTINE X 2) w Reflex to ID Panel     Status: None   Collection Time: 02/12/20  4:07 PM   Specimen: BLOOD RIGHT HAND  Result Value Ref Range Status   Specimen Description BLOOD RIGHT HAND [facility]  Final   Special Requests   Final    BOTTLES DRAWN AEROBIC AND ANAEROBIC Blood Culture adequate volume   Culture   Final    NO GROWTH 5 DAYS Performed at [facility], [address] [place], [place] [address]    Report Status 02/17/2020 FINAL  Final  MRSA PCR Screening     Status: None   Collection Time: 02/13/20  3:03 PM   Specimen: Nasal Mucosa; Nasopharyngeal  Result Value Ref Range Status   MRSA by  PCR NEGATIVE NEGATIVE Final    Comment:        The GeneXpert MRSA Assay (FDA approved for NASAL specimens only), is one component of a comprehensive MRSA colonization surveillance program. It is not intended to diagnose MRSA infection nor to guide or monitor treatment for MRSA infections. Performed at [facility], [address] [place], [place] [address]   Aspergillus Ag, BAL/Serum     Status: None   Collection Time: 02/16/20  6:17 AM   Specimen: Vein  Result Value Ref Range Status   Aspergillus Ag, BAL/Serum 0.03 0.00 - 0.49 Index Final    Comment: (NOTE) Performed At: [facility] [address] [place], [place] [id number] Rush Farmer MD YS:[id number] Performed At: [facility] [address] [place], [place] [id number] Katina Degree MDPhD GU:[id number]          Radiology Studies: CT Super D Chest W Contrast  Result Date: 02/17/2020 CLINICAL DATA:  74 year old male with altered mental status, history of multifocal pneumonia or in the setting of myasthenia gravis. EXAM: CT CHEST WITH CONTRAST TECHNIQUE: Multidetector CT imaging of the chest was  performed using thin slice collimation for electromagnetic bronchoscopy planning purposes, with intravenous contrast. CONTRAST:  61m OMNIPAQUE IOHEXOL 300 MG/ML  SOLN COMPARISON:  Prior PET-CT of 01/24/2020 FINDINGS: Cardiovascular: Ascending dilated to 3.5 cm. Scattered atherosclerosis summer is calcification. Heart size is normal without pericardial effusion. Scattered coronary artery calcification. Central pulmonary vasculature is normal. Mediastinum/Nodes: Small lymph nodes throughout the mediastinum unchanged since recent imaging studies largest 7 mm along the right paratracheal chain. Other smaller nodes track towards the thoracic inlet. No thoracic inlet adenopathy. No axillary lymphadenopathy. No nodal enlargement beyond a cm in the bilateral hila. Correlate with recent PET exam which shows right hilar FDG uptake. Lungs/Pleura: Signs of ground-glass, diminished in general since the previous exam now with more peripheral predominance. Right lower lobe nodule found to be FDG avid in the medial right lung base (image 242, series 5) 1.6 by 0.7 cm not changed accounting for the degree of respiratory motion and was present on the CT angiography there was performed January of 2021 though not well seen on this study due to the motion artifact. Spiculated nodule in the superior segment of the right lower lobe with some central area of added density suggesting some calcification. This measures 2.1 by 2.0 cm, showing a similar appearance. Development of some bronchiectatic changes since the prior study in the left upper lobe, lingula and left lower lobe. Septal thickening is more pronounced also with dilation of left upper lobe bronchi. Process shows a more apical and peripheral than basilar predominance and is occurred in the short interval. No consolidation. No pleural effusion. Upper Abdomen: Incidental imaging of upper abdominal contents is unremarkable. Musculoskeletal: Unchanged appearance of sclerotic foci and  right-sided ribs. Signs of prior fracture to left-sided ribs midthoracic compression fractures without interval change. IMPRESSION: 1. Signs of bronchiectatic changes and septal thickening more so than the ground-glass that was seen on the previous exam. Bronchiectasis was present previously and may be slightly worse on today's exam though the lung parenchyma is better evaluated on today's study. Post inflammatory fibrosis is considered, by report in the medical record the patient has a history of COVID infection. 2. Spiculated nodule superior segment right lower lobe with central area of added density suggesting some calcification, wall finding remains concerning for bronchogenic neoplasm the possibility of sequela of granulomatous infection could also be considered. Biopsy is suggested. 3. Correlate  with recent PET exam which shows right hilar FDG uptake. 4. Stable appearance of sclerotic foci in the right-sided ribs. 5. 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 Recommend annual imaging followup by CTA or MRA. This recommendation follows 2010 ACCF/AHA/AATS/ACR/ASA/SCA/SCAI/SIR/STS/SVM Guidelines for the Diagnosis and Management of Patients with Thoracic Aortic Disease. Circulation.2010; 121: K354-S568. Aortic aneurysm NOS (ICD10-I71.9) 6. Pulmonary embolism seen on prior exam is not demonstrated on the current exam. 7. Scattered coronary artery calcification. Electronically Signed   By: Zetta Bills M.D.   On: 02/17/2020 15:29        Scheduled Meds: . [MAR Hold] dextrose  10 mL Intravenous Q24H  . [MAR Hold] dextrose  10 mL Intravenous Q24H  . [MAR Hold] feeding supplement (ENSURE ENLIVE)  237 mL Oral TID BM  . [MAR Hold] flucytosine  25 mg/kg Oral Q6H  . [MAR Hold] multivitamin with minerals  1 tablet Oral Daily  . [MAR Hold] predniSONE  20 mg Oral Q breakfast  . [MAR Hold] sodium chloride  500 mL Intravenous Q24H  . [MAR Hold] sodium chloride  500 mL Intravenous Q24H  . sodium chloride flush       . [MAR Hold] sodium chloride  1 g Oral TID WC  . [MAR Hold] thiamine  100 mg Oral Daily   Continuous Infusions: . [MAR Hold] amphotericin  B  Liposome (AMBISOME) ADULT IV 200 mg (02/17/20 2047)  . [MAR Hold] piperacillin-tazobactam (ZOSYN)  IV 3.375 g (02/18/20 0555)     LOS: 7 days    Time spent: 30 mins     Wyvonnia Dusky, MD [facility] Pager [phone]  If 7PM-7AM, please contact night-coverage [email or web address] 02/18/2020, 8:11 AM

## 2020-02-18 NOTE — TOC Progression Note (Signed)
Transition of Care [facility]) - Progression Note    Patient Details  Name: Duane Santos MRN: [id number] Date of Birth: [birth year]  Transition of [facility]) CM/SW Contact  Lucina Betty, Gardiner Rhyme, LCSW Phone Number: 02/18/2020, 1:41 PM  Clinical Narrative:   Met with wife who is here to see how pt doing, she reports he had to go back down stairs for another procedure. Aware [facility] will be calling her today. Informed if she were to take him home he would need a hospital bed, wheelchair and bedside commode. She wants to pursue CIR due to may be more deconditioned after procedures. Continue to work on discharge plan for pt.    Expected Discharge Plan: [place] Barriers to Discharge: Continued Medical Work up  Expected Discharge Plan and Services Expected Discharge Plan: [place] In-house Referral: Clinical Social Work   Post Acute Care Choice: Home Health, Durable Medical Equipment Living arrangements for the past 2 months: Single Family Home Expected Discharge Date: 02/15/20                                     Social Determinants of Health (SDOH) Interventions    Readmission Risk Interventions Readmission Risk Prevention Plan 12/30/2019  Transportation Screening Complete  Home Care Screening Complete  Medication Review (RN CM) Complete  Some recent data might be hidden

## 2020-02-18 NOTE — Procedures (Signed)
Electromagnetic navigational bronchoscopy procedure note  Endobronchial ultrasound procedure note  Flexible bronchoscopy with bronchoalveolar lavage procedure note  Procedure was performed on February 18, 2020 by : Dr. Lanney Gins  assistance by : Loretta Plume and Anda Latina RT and 2)anesthesia team  3) fluroscopy tech 4) cytotech team with [facility]   Indication for the procedure was : Left lung mass with hilar and mediastinal lymphadenopathy  Pre-procedural H&P. The following assessment was performed on the day of the procedure prior to initiating sedation History:  Chest pain none Dyspnea patient has chronic dyspnea Hemoptysis none Cough none Fever denies Other pertinent items denies  Examination Vital signs -reviewed as per nursing documentation today Cardiac    Murmurs: None  Rubs : None  Gallop: None Lungs Wheezing: Left lung Rales : Left lung Rhonchi {: Left lung  O  Pre-procedural assessment for Procedural Sedation included: Depth of sedation: As per anesthesia team  ASA Classification: 3 Mallampati airway assessment: 3    Medication list reviewed: Yes  The patient's interval history was taken and revealed: no new complaints The pre- procedure physical examination revealed: No new findings Refer to prior clinic note for details.  Informed Consent: Informed consent was obtained from:  patient after explanation of procedure and risks, benefits, as well as alternative procedures available.  Explanation of level of sedation and possible transfusion was also provided.    Procedural Preparation: Time out was performed and patient was identified by name and birthdate and procedure to be performed and side for sampling, if any, was specified. Pt was intubated by anesthesia.  The patient was appropriately draped.  Fiberoptic bronchoscopy with bronchoalveolar lavage procedure note: Bronchoscope was inserted via ETT  without difficulty.  Posterior oropharynx,  epiglottis, arytenoids, false cords and vocal cords were not visualized as these were bypassed by endotracheal tube.   The distal trachea was normal in circumference and appearance without mucosal, cartilaginous or branching abnormalities.  The main carina was mildly splayed.   All right and left lobar airways were visualized to the Sub-segmental level except basal segments of the left lower lobe due to.  Sub- sub segmental carinae were identified in all the distal airways.   Secretions were visible in the following airways and appeared to be normal.  The mucosa was : Nonfriable  Airways were notable for:        exophytic lesions : Not found       extrinsic compression in the following distributions: None.       Friable mucosa: None       Anthrocotic material /pigmentation: None   Pictorial documentation attached: None  BAL was done at [facility] for microbiology and cytology  Post procedure diagnosis: Normal airway inspection with serosanguineous cellular aspirate on bronchoalveolar lavage.   Electromagnetic navigational bronchoscopy procedure note: After appropriate planning and registration phase LG was advanced to target lesion via peripheral navigation view.  Initially a Cytobrush was performed in this area x3-Cytotec and preliminary reading reporting fungal elements in spherical shape with halo around suggestive of cryptococcal infection.  Bronchoalveolar lavage was again performed at this area.  Next forceps of target lesion x3 were obtained, Cytotec on preliminary reading reporting yeast cells with halo suggestive of cryptococcal infection.    Postprocedure diagnosis: Cryptococcal infection      Endobronchial ultrasound assisted lymph node biopsy procedure note: The fiberoptic bronchoscope was removed and the EBUS scope was introduced. Examination began to evaluate for pathologically enlarged lymph nodes starting on the L side progressing to  the right.  All lymph node biopsies  performed with 21g needle. Lymph node biopsies were sent in cytolite for all stations.  Initially station 10 L was evaluated measured to be 1.1 cm under ultrasound guidance biopsied 3 times with good lymphoid shedding but absence of atypia reported by Cytotec.  Next station 7 was measured and found to be 1.2 cm this was biopsied 3 times and was found to be negative for atypia however did have additional spherical yeast form suggestive of similar cryptococcal infiltration as target mass lesion.  Next station 10 R was evaluated under ultrasound guidance and found to be 1.3 cm this was also biopsied 3 times with findings similar to station 7 with good lymphoid shedding and possible yeast forms reported by Cytotec staff.  Post procedure diagnosis: Negative for malignancy on lymph node biopsies possible cryptococcal infiltration of lymph nodes   Specimens obtained included:                    Cytology brushes : Right lower lobe  Broncho-alveolar lavage site: Right lower lobe sent for fungal and bacterial cultures as well as cytology                             120 ml volume infused 75 ml volume returned with bloody appearance   Fluoroscopy Used: Yes;        Pictorial documentation attached: No    Immediate sampling complications included: None Epinephrine 0 ml was used topically  The bronchoscopy was terminated due to completion of the planned procedure and the bronchoscope was removed.   Total dosage of Lidocaine was 0 mg Total fluoroscopy time was 4 minutes    Estimated Blood loss: 12 cc.  Complications included: None immediate   Disposition: Patient still inpatient admission  Follow up with Dr.A in 7 days for result discussion.   Claudette Stapler MD  [place] Division of Pulmonary & Critical Care Medicine

## 2020-02-18 NOTE — Progress Notes (Signed)
PT Cancellation Note  Patient Details Name: Duane Santos MRN: [id number] DOB: [birth year]   Cancelled Treatment:    Reason Eval/Treat Not Completed: Other (comment)   Pt off of unit for biopsy.  Remains so at this time.  Due to general anesthesia, he will need new PT orders/eval or continue on transfer orders.  Will hold at this time.  MD had been messaged by PT supervisor in regards to orders.  Will continue as appropriate as SWS is awaiting updated PT/OT notes to assist in d/c planning/disposition.   Chesley Noon 02/18/2020, 11:27 AM

## 2020-02-18 NOTE — Transfer of Care (Signed)
Immediate Anesthesia Transfer of Care Note  Patient: Duane Santos  Procedure(s) Performed: BIOPSY OF MEDIASTINAL MASS (Bilateral )  Patient Location: PACU  Anesthesia Type:General  Level of Consciousness: drowsy  Airway & Oxygen Therapy: Patient Spontanous Breathing and Patient connected to face mask oxygen  Post-op Assessment: Vital signs stable Post vital signs: Reviewed and stable  Last Vitals:  Vitals Value Taken Time  BP 132/60 02/18/20   1024  Temp    Pulse 95 02/18/20 1024  Resp 27 02/18/20 1024  SpO2 100 % 02/18/20 1024  Vitals shown include unvalidated device data.  Last Pain:  Vitals:   02/18/20 0710  TempSrc: Temporal  PainSc:          Complications: No apparent anesthesia complications

## 2020-02-18 NOTE — Anesthesia Postprocedure Evaluation (Signed)
Anesthesia Post Note  Patient: Duane Santos  Procedure(s) Performed: BIOPSY OF MEDIASTINAL MASS (Bilateral )  Patient location during evaluation: PACU Anesthesia Type: General Level of consciousness: lethargic Pain management: pain level controlled Vital Signs Assessment: post-procedure vital signs reviewed and stable Respiratory status: spontaneous breathing, nonlabored ventilation, respiratory function stable and patient connected to nasal cannula oxygen Cardiovascular status: blood pressure returned to baseline and stable Postop Assessment: no apparent nausea or vomiting Anesthetic complications: no     Last Vitals:  Vitals:   02/18/20 1105 02/18/20 1120  BP: 134/85 134/79  Pulse: 99 97  Resp: (!) 28 20  Temp:  36.6 C  SpO2: 98% 99%    Last Pain:  Vitals:   02/18/20 1120  TempSrc: Oral  PainSc:                  Arita Miss

## 2020-02-18 NOTE — Progress Notes (Addendum)
[name] Date of Admission:  02/09/2020     ID: Duane Santos is a 74 y.o. male with AMS Principal Problem:   Acute confusional state Active Problems:   Multifocal pneumonia   Myasthenia gravis ([place])   Right lower lobe lung mass   Generalized weakness   Hyponatremia   History of COVID-19 pneumonia with hypoxia   Acute metabolic encephalopathy   Chronic respiratory failure with hypoxia (HCC)   Palliative care encounter   Subjective: Remains confused, no fevers wife at bedside  ROS  Unable to obtain  Medications:  Antibiotics Given (last 72 hours)    Date/Time Action Medication Dose Rate   02/15/20 2204 New Bag/Given   piperacillin-tazobactam (ZOSYN) IVPB 3.375 g 3.375 g 12.5 mL/hr   02/16/20 0533 New Bag/Given   piperacillin-tazobactam (ZOSYN) IVPB 3.375 g 3.375 g 12.5 mL/hr   02/16/20 1430 New Bag/Given   piperacillin-tazobactam (ZOSYN) IVPB 3.375 g 3.375 g 12.5 mL/hr   02/16/20 2152 New Bag/Given   piperacillin-tazobactam (ZOSYN) IVPB 3.375 g 3.375 g 12.5 mL/hr   02/17/20 0525 New Bag/Given   piperacillin-tazobactam (ZOSYN) IVPB 3.375 g 3.375 g 12.5 mL/hr   02/17/20 1414 New Bag/Given   piperacillin-tazobactam (ZOSYN) IVPB 3.375 g 3.375 g 12.5 mL/hr   02/17/20 2316 New Bag/Given   piperacillin-tazobactam (ZOSYN) IVPB 3.375 g 3.375 g 12.5 mL/hr   02/18/20 0555 New Bag/Given   piperacillin-tazobactam (ZOSYN) IVPB 3.375 g 3.375 g 12.5 mL/hr     . butamben-tetracaine-benzocaine  1 spray Topical Once  . dextrose  10 mL Intravenous Q24H  . dextrose  10 mL Intravenous Q24H  . feeding supplement (ENSURE ENLIVE)  237 mL Oral TID BM  . flucytosine  25 mg/kg Oral Q6H  . lidocaine (PF)  30 mL Other Once  . lidocaine  1 application Topical Once  . multivitamin with minerals  1 tablet Oral Daily  . predniSONE  20 mg Oral Q breakfast  . sodium chloride  500 mL Intravenous Q24H  . sodium chloride  500 mL Intravenous Q24H  . sodium  chloride  1 g Oral TID WC  . thiamine  100 mg Oral Daily    Objective: Vital signs in last 24 hours: Temp:  [97.4 F (36.3 C)-101.1 F (38.4 C)] 97.8 F (36.6 C) (03/12 1120) Pulse Rate:  [83-109] 97 (03/12 1120) Resp:  [19-28] 20 (03/12 1120) BP: (128-150)/(67-87) 134/79 (03/12 1120) SpO2:  [98 %-100 %] 99 % (03/12 1120) Constitutional: He is thin, on O2,confused and unable to give a history HENT: anicteric Mouth/Throat: Oropharynx is clear and moist. Cardiovascular: Normal rate, regular rhythm and normal heart sounds. Exam reveals no gallop and no friction rub. No murmur heard.  Pulmonary/Chest: rhonchi bil Abdominal: Soft. Bowel sounds are normal. He exhibits no distension. There is no tenderness.  Lymphadenopathy:  He has no cervical adenopathy.  Neurological: able to move all 4, confused but trying to think of answers when questions asked Skin: Skin is warm and dry. No rash noted. No erythema.  Psychiatric: confused  Lab Results Recent Labs    02/17/20 0353 02/18/20 0603  WBC 11.7* 13.8*  HGB 12.8* 12.6*  HCT 38.6* 40.5  NA 145 145  K 4.1 3.9  CL 105 106  CO2 31 29  BUN 26* 27*  CREATININE 0.79 0.76    Microbiology: Results for orders placed or performed during the hospital encounter of 02/09/20  CULTURE, BLOOD (ROUTINE X 2) w Reflex to ID Panel  Status: None   Collection Time: 02/12/20  3:59 PM   Specimen: BLOOD  Result Value Ref Range Status   Specimen Description BLOOD RAC  Final   Special Requests   Final    BOTTLES DRAWN AEROBIC AND ANAEROBIC Blood Culture results may not be optimal due to an excessive volume of blood received in culture bottles   Culture   Final    NO GROWTH 5 DAYS Performed at [facility], [address] [place], [place] [address]    Report Status 02/17/2020 FINAL  Final  CULTURE, BLOOD (ROUTINE X 2) w Reflex to ID Panel     Status: None   Collection Time: 02/12/20  4:07 PM   Specimen: BLOOD RIGHT HAND  Result  Value Ref Range Status   Specimen Description BLOOD RIGHT HAND [facility]  Final   Special Requests   Final    BOTTLES DRAWN AEROBIC AND ANAEROBIC Blood Culture adequate volume   Culture   Final    NO GROWTH 5 DAYS Performed at [facility], [address] [place], [place] [address]    Report Status 02/17/2020 FINAL  Final  MRSA PCR Screening     Status: None   Collection Time: 02/13/20  3:03 PM   Specimen: Nasal Mucosa; Nasopharyngeal  Result Value Ref Range Status   MRSA by PCR NEGATIVE NEGATIVE Final    Comment:        The GeneXpert MRSA Assay (FDA approved for NASAL specimens only), is one component of a comprehensive MRSA colonization surveillance program. It is not intended to diagnose MRSA infection nor to guide or monitor treatment for MRSA infections. Performed at [facility], [address] [place], [place] [address]   Aspergillus Ag, BAL/Serum     Status: None   Collection Time: 02/16/20  6:17 AM   Specimen: Vein  Result Value Ref Range Status   Aspergillus Ag, BAL/Serum 0.03 0.00 - 0.49 Index Final    Comment: (NOTE) Performed At: [facility] [address] [place], [place] [id number] Rush Farmer MD YP:[id number] Performed At: [facility] [address] [place], [place] [id number] Katina Degree MDPhD AS:[id number]     Studies/Results: DG C-Arm 1-60 Min-No Report  Result Date: 02/18/2020 Fluoroscopy was utilized by the requesting physician.  No radiographic interpretation.   CT Super D Chest W Contrast  Result Date: 02/17/2020 CLINICAL DATA:  74 year old male with altered mental status, history of multifocal pneumonia or in the setting of myasthenia gravis. EXAM: CT CHEST WITH CONTRAST TECHNIQUE: Multidetector CT imaging of the chest was performed using thin slice collimation for electromagnetic bronchoscopy planning purposes, with intravenous contrast. CONTRAST:  50m OMNIPAQUE IOHEXOL 300 MG/ML  SOLN COMPARISON:  Prior PET-CT of  01/24/2020 FINDINGS: Cardiovascular: Ascending dilated to 3.5 cm. Scattered atherosclerosis summer is calcification. Heart size is normal without pericardial effusion. Scattered coronary artery calcification. Central pulmonary vasculature is normal. Mediastinum/Nodes: Small lymph nodes throughout the mediastinum unchanged since recent imaging studies largest 7 mm along the right paratracheal chain. Other smaller nodes track towards the thoracic inlet. No thoracic inlet adenopathy. No axillary lymphadenopathy. No nodal enlargement beyond a cm in the bilateral hila. Correlate with recent PET exam which shows right hilar FDG uptake. Lungs/Pleura: Signs of ground-glass, diminished in general since the previous exam now with more peripheral predominance. Right lower lobe nodule found to be FDG avid in the medial right lung base (image 242, series 5) 1.6 by 0.7 cm not changed accounting for the degree of respiratory  motion and was present on the CT angiography there was performed January of 2021 though not well seen on this study due to the motion artifact. Spiculated nodule in the superior segment of the right lower lobe with some central area of added density suggesting some calcification. This measures 2.1 by 2.0 cm, showing a similar appearance. Development of some bronchiectatic changes since the prior study in the left upper lobe, lingula and left lower lobe. Septal thickening is more pronounced also with dilation of left upper lobe bronchi. Process shows a more apical and peripheral than basilar predominance and is occurred in the short interval. No consolidation. No pleural effusion. Upper Abdomen: Incidental imaging of upper abdominal contents is unremarkable. Musculoskeletal: Unchanged appearance of sclerotic foci and right-sided ribs. Signs of prior fracture to left-sided ribs midthoracic compression fractures without interval change. IMPRESSION: 1. Signs of bronchiectatic changes and septal thickening more so  than the ground-glass that was seen on the previous exam. Bronchiectasis was present previously and may be slightly worse on today's exam though the lung parenchyma is better evaluated on today's study. Post inflammatory fibrosis is considered, by report in the medical record the patient has a history of COVID infection. 2. Spiculated nodule superior segment right lower lobe with central area of added density suggesting some calcification, wall finding remains concerning for bronchogenic neoplasm the possibility of sequela of granulomatous infection could also be considered. Biopsy is suggested. 3. Correlate with recent PET exam which shows right hilar FDG uptake. 4. Stable appearance of sclerotic foci in the right-sided ribs. 5. 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 Recommend annual imaging followup by CTA or MRA. This recommendation follows 2010 ACCF/AHA/AATS/ACR/ASA/SCA/SCAI/SIR/STS/SVM Guidelines for the Diagnosis and Management of Patients with Thoracic Aortic Disease. Circulation.2010; 121: K917-H150. Aortic aneurysm NOS (ICD10-I71.9) 6. Pulmonary embolism seen on prior exam is not demonstrated on the current exam. 7. Scattered coronary artery calcification. Electronically Signed   By: Zetta Bills M.D.   On: 02/17/2020 15:29    Assessment/Plan: Duane Santos is a 74 y.o. 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 So far MRI brain with possible cerebellar subacute infarct, met or artifact, EEG non diagnostic, BCX neg, echo neg.  The differential dx for his fevers, confusion and pneumonia are broad in this immunocompromised patient and include typical etiologies of viral encephalitis as well as fungal causes. CNS mets or paraneoplastic encephalitis also possible. He is not septic and cultures  negative, unlikely to be pyogenic bacterial infection. 3/11- no fevers, wbc 11. Remains confused.   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 Close following of safety labs, lytes, renal and cell counts Will need 2 weeks IV at least. Can place picc when possible Monitor closely over weekend. May get worse before he gets better. If worsens neurologically would repeat LP to measure opening pressure and remove excess fluid if OP > 25. Goal OP < 20.  I have messaged his neurologist at [facility] regarding urgent need to decrease immunosuppression.  STEROIDS are very bad for crypto patients and he should be weaned to the lowest dose possible to prevent MG flare or adrenal insufficiency.  Currently on 20 mg.  Will go ahead and decrease to 15 mg today.  Addendum Spoke with [facility] neurologist - she agrees need to decrease steroids and wean off if possible. Rec decrease to 15 mg now and 10 mg in 3 days . Can wean as possible given risk of adrenal insuffiencey. Monitor for signs of MG flare but most important to limit immunosuppression Thank you very much for the consult. Will follow with you.  Duane Santos   02/18/2020, 2:37 PM

## 2020-02-19 LAB — BASIC METABOLIC PANEL
Anion gap: 9 (ref 5–15)
BUN: 18 mg/dL (ref 8–23)
CO2: 28 mmol/L (ref 22–32)
Calcium: 8.7 mg/dL — ABNORMAL LOW (ref 8.9–10.3)
Chloride: 105 mmol/L (ref 98–111)
Creatinine, Ser: 0.67 mg/dL (ref 0.61–1.24)
GFR calc Af Amer: >60 mL/min (ref >60)
GFR calc non Af Amer: >60 mL/min (ref >60)
Glucose, Bld: 102 mg/dL — ABNORMAL HIGH (ref 70–99)
Potassium: 3.6 mmol/L (ref 3.5–5.1)
Sodium: 142 mmol/L (ref 135–145)

## 2020-02-19 LAB — CBC
HCT: 37.6 % — ABNORMAL LOW (ref 39.0–52.0)
Hemoglobin: 11.9 g/dL — ABNORMAL LOW (ref 13.0–17.0)
MCH: 32.5 pg (ref 26.0–34.0)
MCHC: 31.6 g/dL (ref 30.0–36.0)
MCV: 102.7 fL — ABNORMAL HIGH (ref 80.0–100.0)
Platelets: 214 10*3/uL (ref 150–400)
RBC: 3.66 MIL/uL — ABNORMAL LOW (ref 4.22–5.81)
RDW: 15.9 % — ABNORMAL HIGH (ref 11.5–15.5)
WBC: 10.6 10*3/uL — ABNORMAL HIGH (ref 4.0–10.5)
nRBC: 0 % (ref 0.0–0.2)

## 2020-02-19 LAB — MAGNESIUM: Magnesium: 2.4 mg/dL (ref 1.7–2.4)

## 2020-02-19 NOTE — Progress Notes (Signed)
Occupational Therapy Treatment Patient Details Name: Duane Santos MRN: [id number] DOB: [birth year] Today's Date: 02/19/2020    History of present illness Pt. presented to ER secondary to AMS, progressive weakness and repeated falls; admitted for management of acute metabolic encephalopathy.  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 PMH significant for MG, covid-19 infection (1/21) and newly-diagnosed R lung cancer.  s/p lung biopsy, pending LP for continued work up.   OT comments  New orders were received to resume OT services following lung Biopsy. Pt. continues to require assist for ADL tasks as indicated below. Pt. requires verbal cues, visual demonstration, and time for processing. SO2 is 100% on 2LO2, HR 105-114 bpms. Nursing was notified. Plan to have pt. continue with established POC, and goals. Pt. Continues to benefit from OT services for ADL training, A/E training, and pt. Education about energy conservation, work simplification, home modification, and DME.    Follow Up Recommendations  CIR    Equipment Recommendations  Other (comment)    Recommendations for Other Services      Precautions / Restrictions Precautions Precautions: Fall Restrictions Weight Bearing Restrictions: No       Mobility Bed Mobility  Overal bed mobility: Needs Assistance                Transfers  Overall transfer level: Needs assistance   Transfers: Sit to/from Stand Sit to Stand: Min assist              Balance                                           ADL either performed or assessed with clinical judgement   ADL Overall ADL's : Needs assistance/impaired Eating/Feeding: Supervision/ safety;Sitting   Grooming: Set up;Supervision/safety;Sitting           Upper Body Dressing : Minimal assistance   Lower Body Dressing: Minimal assistance;Sitting/lateral leans   Toilet Transfer: Minimal assistance;+2 for  safety/equipment;Moderate assistance                   Vision Baseline Vision/History: No visual deficits Patient Visual Report: No change from baseline     Perception     Praxis      Cognition Arousal/Alertness: Awake/alert Behavior During Therapy: Flat affect Overall Cognitive Status: Impaired/Different from baseline Area of Impairment: Orientation;Attention;Memory;Following commands;Safety/judgement;Awareness;Problem solving                 Orientation Level: Person Current Attention Level: Alternating   Following Commands: Follows one step commands inconsistently(Increased time) Safety/Judgement: Decreased awareness of safety;Decreased awareness of deficits   Problem Solving: Slow processing;Decreased initiation;Difficulty sequencing;Requires verbal cues;Requires tactile cues General Comments: oriented to self; follows very simple, verbal commands but requires incresaed time for processing and often therapist demonstration/hand-over-hand to fully comprehend and initiate/complete        Exercises     Shoulder Instructions       General Comments      Pertinent Vitals/ Pain       Pain Assessment: Faces Pain Score: 2  Pain Location: Posterior neck Pain Descriptors / Indicators: [place]  Prior Functioning/Environment              Frequency  Min 2X/week        Progress Toward Goals  OT Goals(current goals can now be found in the care plan section)  Progress towards OT goals: Progressing toward goals  Acute Rehab OT Goals Patient Stated Goal: agreeable to session OT Goal Formulation: With patient Time For Goal Achievement: 02/25/20 Potential to Achieve Goals: Good  Plan Discharge plan needs to be updated    Co-evaluation                 AM-PAC OT "6 Clicks" Daily Activity     Outcome Measure   Help from another person eating meals?: A Little Help from  another person taking care of personal grooming?: A Little Help from another person toileting, which includes using toliet, bedpan, or urinal?: A Lot Help from another person bathing (including washing, rinsing, drying)?: A Lot Help from another person to put on and taking off regular upper body clothing?: A Little Help from another person to put on and taking off regular lower body clothing?: A Lot 6 Click Score: 15    End of Session Equipment Utilized During Treatment: Gait belt;Rolling walker  OT Visit Diagnosis: Muscle weakness (generalized) (M62.81);Unsteadiness on feet (R26.81)   Activity Tolerance Patient tolerated treatment well   Patient Left in chair;with call bell/phone within reach;with chair alarm set   Nurse Communication Precautions;Other (comment)        Time: 4081-4481 OT Time Calculation (min): 17 min  Charges: OT General Charges $OT Visit: 1 Visit OT Evaluation $OT Re-eval: 1 Re-eval  Harrel Carina, MS, OTR/L   Harrel Carina 02/19/2020, 11:08 AM

## 2020-02-19 NOTE — Progress Notes (Signed)
PROGRESS NOTE    Duane Santos  TJQ:[id number] DOB: December 18, 1945 DOA: 02/09/2020 PCP: [facility]      Assessment & Plan:   Principal Problem:   Acute confusional state Active Problems:   Multifocal pneumonia   Myasthenia gravis ([place])   Right lower lobe lung mass   Generalized weakness   Hyponatremia   History of COVID-19 pneumonia with hypoxia   Acute metabolic encephalopathy   Chronic respiratory failure with hypoxia ([place])   Palliative care encounter   Acute confusional state: etiology unclear, subacute CVA vs mets as per MRI. Intermittent, oriented to person, month, year only today. Continue supportive care. Echo shows EF 65-70%, interatrial septum was not well visualized. 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 Continue on amphotericin B, flucytosine as per ID. S/p LP w/ cultures/studies pending   Subacute CVA vs metastatic disease: as per MRI. Continue w/ neuro checks. PT/OT initially recommending SNF, now PT recs "CIR as per wife's request"   Multifocal pneumonia: w/ recurrent fevers & probable hospital-acquired, immunocompromised state. No fever overnight. 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 Continue fluid restriction and salt tablets.   Right lung mass: suspicious for malignancy, sclerotic changes of the right third fourth and fifth ribs suspicious for metastatic disease.  S/p bronchoscopy w/ biopsy on 02/18/20, path pending. Follow-up with palliative care. Pulmon following and recs apprec  Recent pulmonary embolism and DVT: diagnosed on December 23, 2019. Restarted back on eliquis.   Myasthenia gravis: continue prednisone taper as per ID   Leukocytosis: steroids induced vs infection. 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 Recommend annual imaging followup by CTA or MRA  Depression:  psychiatry consulted as per wife's request. Psych following and recs apprec   Generalized weakness/debility: PT/OT initially recommending SNF, now PT recs "CIR as per wife's request". BMI 19.21    DVT prophylaxis: eliquis  Code Status: DNR Family Communication: discussed pt's care w/ pt's wife, Maudie Mercury, and answered her questions  Disposition Plan:  PT/OT initially recommending SNF, now PT recs "CIR as per wife's request"; disposition is unknown at this point   Consultants:   Pulmon  Palliative care  Psychiatry   nephro  neuro   Procedures:    Antimicrobials: zosyn   Subjective: Pt did not verbalize any complaints this morning. Pt is oriented to person, month & year only today.  Objective: Vitals:   02/18/20 1859 02/18/20 1946 02/18/20 2316 02/19/20 0340  BP: 119/84 136/77 (!) 145/93 (!) 147/86  Pulse: (!) 122 (!) 115 (!) 115 (!) 105  Resp: (!) _0 Temp: 97.6 F (36.4 C) 97.9 F (36.6 C) 98.6 F (37 C) 98.2 F (36.8 C)  TempSrc: Oral Oral Oral Oral  SpO2: 100% 100% 100% 100%  Weight:      Height:        Intake/Output Summary (Last 24 hours) at 02/19/2020 0727 Last data filed at 02/18/2020 2300 Gross per 24 hour  Intake 800 ml  Output 610 ml  Net 190 ml   Filed Weights   02/09/20 1656  Weight: 54 kg    Examination:  General exam: Appears calm and comfortable  Respiratory system: decreased breath sounds b/l.  Cardiovascular system: S1 & S2. No rubs, gallops or clicks.  Gastrointestinal system: Abdomen is nondistended, soft and nontender. Normal bowel sounds heard.  Central nervous system: Alert and awake.  Moves all 4 extremities  Psychiatry: Judgement and insight appear abnormal. Flat mood and affect    Data Reviewed: I have personally reviewed following labs and imaging studies  CBC: Recent Labs  Lab 02/13/20 0628 02/14/20 0818 02/17/20 0353 02/18/20 0603 02/19/20 0538  WBC 11.7* 11.1* 11.7* 13.8* 10.6*  NEUTROABS  --  9.8*  --    --   --   HGB 12.1* 12.3* 12.8* 12.6* 11.9*  HCT 35.7* 36.5* 38.6* 40.5 37.6*  MCV 97.8 98.4 100.3* 105.2* 102.7*  PLT 274 273 297 263 867   Basic Metabolic Panel: Recent Labs  Lab 02/14/20 0818 02/16/20 0617 02/17/20 0353 02/18/20 0603 02/19/20 0538  NA 133* 139 145 145 142  K 3.7 4.0 4.1 3.9 3.6  CL 100 99 105 106 105  CO2 _0 GLUCOSE 113* 103* 120* 101* 102*  BUN 22 24* 26* 27* 18  CREATININE 0.71 0.95 0.79 0.76 0.67  CALCIUM 8.5* 9.5 9.7 9.5 8.7*  MG  --   --   --  2.6* 2.4   GFR: Estimated Creatinine Clearance: 63.8 mL/min (by C-G formula based on SCr of 0.67 mg/dL). Liver Function Tests: No results for input(s): AST, ALT, ALKPHOS, BILITOT, PROT, ALBUMIN in the last 168 hours. No results for input(s): LIPASE, AMYLASE in the last 168 hours. No results for input(s): AMMONIA in the last 168 hours. Coagulation Profile: Recent Labs  Lab 02/17/20 1512  INR 0.9   Cardiac Enzymes: No results for input(s): CKTOTAL, CKMB, CKMBINDEX, TROPONINI in the last 168 hours. BNP (last 3 results) No results for input(s): PROBNP in the last 8760 hours. HbA1C: No results for input(s): HGBA1C in the last 72 hours. CBG: Recent Labs  Lab 02/18/20 1556  GLUCAP 102*   Lipid Profile: No results for input(s): CHOL, HDL, LDLCALC, TRIG, CHOLHDL, LDLDIRECT in the last 72 hours. Thyroid Function Tests: No results for input(s): TSH, T4TOTAL, FREET4, T3FREE, THYROIDAB in the last 72 hours. Anemia Panel: No results for input(s): VITAMINB12, FOLATE, FERRITIN, TIBC, IRON, RETICCTPCT in the last 72 hours. Sepsis Labs: No results for input(s): PROCALCITON, LATICACIDVEN in the last 168 hours.  Recent Results (from the past 240 hour(s))  CULTURE, BLOOD (ROUTINE X 2) w Reflex to ID Panel     Status: None   Collection Time: 02/12/20  3:59 PM   Specimen: BLOOD  Result Value Ref Range Status   Specimen Description BLOOD RAC  Final   Special Requests   Final    BOTTLES DRAWN AEROBIC  AND ANAEROBIC Blood Culture results may not be optimal due to an excessive volume of blood received in culture bottles   Culture   Final    NO GROWTH 5 DAYS Performed at [facility], [address] [place], [place] [address]    Report Status 02/17/2020 FINAL  Final  CULTURE, BLOOD (ROUTINE X 2) w Reflex to ID Panel     Status: None   Collection Time: 02/12/20  4:07 PM   Specimen: BLOOD RIGHT HAND  Result Value Ref Range Status   Specimen Description BLOOD RIGHT HAND [facility]  Final   Special Requests   Final    BOTTLES DRAWN AEROBIC AND ANAEROBIC Blood Culture adequate volume   Culture   Final    NO GROWTH 5 DAYS Performed at [facility], [address] [place], [place] [address]    Report Status 02/17/2020 FINAL  Final  MRSA PCR Screening     Status: None  Collection Time: 02/13/20  3:03 PM   Specimen: Nasal Mucosa; Nasopharyngeal  Result Value Ref Range Status   MRSA by PCR NEGATIVE NEGATIVE Final    Comment:        The GeneXpert MRSA Assay (FDA approved for NASAL specimens only), is one component of a comprehensive MRSA colonization surveillance program. It is not intended to diagnose MRSA infection nor to guide or monitor treatment for MRSA infections. Performed at [facility], [address] [place], [place] [address]   Aspergillus Ag, BAL/Serum     Status: None   Collection Time: 02/16/20  6:17 AM   Specimen: Lung, Right  Result Value Ref Range Status   Aspergillus Ag, BAL/Serum 0.03 0.00 - 0.49 Index Final    Comment: (NOTE) Performed At: [facility] [address] [place], [place] [id number] Rush Farmer MD [id number] Performed At: [facility] [address] [place], [place] [id number] Katina Degree MDPhD [id number]   Culture, bal-quantitative     Status: None (Preliminary result)   Collection Time: 02/18/20 10:00 AM   Specimen: Bronchoalveolar Lavage  Result Value Ref Range Status   Specimen Description    Final    BRONCHIAL ALVEOLAR LAVAGE Performed at [facility], [address], [place], [place] [address]    Special Requests   Final    NONE Performed at [facility], [address] [place], [place] [address]    Gram Stain   Final    NO WBC SEEN NO ORGANISMS SEEN Performed at [facility], [place] [address], [place], [place] [address]    Culture PENDING  Incomplete   Report Status PENDING  Incomplete  CSF culture     Status: Abnormal (Preliminary result)   Collection Time: 02/18/20  2:51 PM   Specimen: CSF; Cerebrospinal Fluid  Result Value Ref Range Status   Specimen Description CSF  Final   Special Requests NONE  Final   Gram Stain (A)  Final    YEAST CRITICAL RESULT CALLED TO, READ BACK BY AND VERIFIED WITH: SERENITY KIRKENDALL AT 1600 02/18/20.PMF WBC SEEN RED BLOOD CELLS PRESENT Performed at [facility], [address] [place],  [address]    Culture PENDING  Incomplete   Report Status PENDING  Incomplete         Radiology Studies: DG C-Arm 1-60 Min-No Report  Result Date: 02/18/2020 Fluoroscopy was utilized by the requesting physician.  No radiographic interpretation.   [place] EKG SITE RITE  Result Date: 02/18/2020 If Site Rite image not attached, placement could not be confirmed due to current cardiac rhythm.  DG FL GUIDED LUMBAR PUNCTURE  Result Date: 02/18/2020 CLINICAL DATA:  Confusion, cryptococcal antigen EXAM: DIAGNOSTIC LUMBAR PUNCTURE UNDER FLUOROSCOPIC GUIDANCE FLUOROSCOPY TIME:  Fluoroscopy Time:  00:30 Number of Acquired Spot Images: 6 PROCEDURE: Informed consent was obtained from the patient prior to the procedure, including potential complications of headache, allergy, and pain. With the patient prone, the lower back was prepped with Betadine. 1% Lidocaine was used for local anesthesia. Lumbar puncture was performed at the L3-L4 level using a 22 gauge needle with return of clear CSF with an opening pressure of  less than 9 cm water. 8 ml of CSF were obtained for laboratory studies. The patient tolerated the procedure well and there were no apparent complications. IMPRESSION: Successful fluoroscopic lumbar puncture. 8 mL clear CSF obtained and submitted to the laboratory. Electronically Signed   By: Eddie Candle M.D.   On: 02/18/2020 14:57  CT Super D Chest W Contrast  Result Date: 02/17/2020 CLINICAL DATA:  74 year old male with altered mental status, history of multifocal pneumonia or in the setting of myasthenia gravis. EXAM: CT CHEST WITH CONTRAST TECHNIQUE: Multidetector CT imaging of the chest was performed using thin slice collimation for electromagnetic bronchoscopy planning purposes, with intravenous contrast. CONTRAST:  75m OMNIPAQUE IOHEXOL 300 MG/ML  SOLN COMPARISON:  Prior PET-CT of 01/24/2020 FINDINGS: Cardiovascular: Ascending dilated to 3.5 cm. Scattered atherosclerosis summer is calcification. Heart size is normal without pericardial effusion. Scattered coronary artery calcification. Central pulmonary vasculature is normal. Mediastinum/Nodes: Small lymph nodes throughout the mediastinum unchanged since recent imaging studies largest 7 mm along the right paratracheal chain. Other smaller nodes track towards the thoracic inlet. No thoracic inlet adenopathy. No axillary lymphadenopathy. No nodal enlargement beyond a cm in the bilateral hila. Correlate with recent PET exam which shows right hilar FDG uptake. Lungs/Pleura: Signs of ground-glass, diminished in general since the previous exam now with more peripheral predominance. Right lower lobe nodule found to be FDG avid in the medial right lung base (image 242, series 5) 1.6 by 0.7 cm not changed accounting for the degree of respiratory motion and was present on the CT angiography there was performed January of 2021 though not well seen on this study due to the motion artifact. Spiculated nodule in the superior segment of the right lower lobe with some  central area of added density suggesting some calcification. This measures 2.1 by 2.0 cm, showing a similar appearance. Development of some bronchiectatic changes since the prior study in the left upper lobe, lingula and left lower lobe. Septal thickening is more pronounced also with dilation of left upper lobe bronchi. Process shows a more apical and peripheral than basilar predominance and is occurred in the short interval. No consolidation. No pleural effusion. Upper Abdomen: Incidental imaging of upper abdominal contents is unremarkable. Musculoskeletal: Unchanged appearance of sclerotic foci and right-sided ribs. Signs of prior fracture to left-sided ribs midthoracic compression fractures without interval change. IMPRESSION: 1. Signs of bronchiectatic changes and septal thickening more so than the ground-glass that was seen on the previous exam. Bronchiectasis was present previously and may be slightly worse on today's exam though the lung parenchyma is better evaluated on today's study. Post inflammatory fibrosis is considered, by report in the medical record the patient has a history of COVID infection. 2. Spiculated nodule superior segment right lower lobe with central area of added density suggesting some calcification, wall finding remains concerning for bronchogenic neoplasm the possibility of sequela of granulomatous infection could also be considered. Biopsy is suggested. 3. Correlate with recent PET exam which shows right hilar FDG uptake. 4. Stable appearance of sclerotic foci in the right-sided ribs. 5. 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 Recommend annual imaging followup by CTA or MRA. This recommendation follows 2010 ACCF/AHA/AATS/ACR/ASA/SCA/SCAI/SIR/STS/SVM Guidelines for the Diagnosis and Management of Patients with Thoracic Aortic Disease. Circulation.2010; 121:: Z610-R604 Aortic aneurysm NOS (ICD10-I71.9) 6. Pulmonary embolism seen on prior exam is not demonstrated on the current exam.  7. Scattered coronary artery calcification. Electronically Signed   By: GZetta BillsM.D.   On: 02/17/2020 15:29        Scheduled Meds: . [START ON 02/20/2020] apixaban  5 mg Oral BID  . butamben-tetracaine-benzocaine  1 spray Topical Once  . Chlorhexidine Gluconate Cloth  6 each Topical Daily  . dextrose  10 mL Intravenous Q24H  . dextrose  10 mL Intravenous Q24H  . feeding supplement (  ENSURE ENLIVE)  237 mL Oral TID BM  . flucytosine  25 mg/kg Oral Q6H  . lidocaine (PF)  30 mL Other Once  . lidocaine  1 application Topical Once  . multivitamin with minerals  1 tablet Oral Daily  . predniSONE  15 mg Oral Q breakfast  . sodium chloride  500 mL Intravenous Q24H  . sodium chloride  500 mL Intravenous Q24H  . sodium chloride flush  10-40 mL Intracatheter Q12H  . sodium chloride  1 g Oral TID WC  . thiamine  100 mg Oral Daily   Continuous Infusions: . amphotericin  B  Liposome (AMBISOME) ADULT IV 200 mg (02/18/20 2308)  . piperacillin-tazobactam (ZOSYN)  IV 3.375 g (02/19/20 0113)     LOS: 8 days    Time spent: 30 mins     Wyvonnia Dusky, MD [facility] Pager [phone]  If 7PM-7AM, please contact night-coverage [email or web address] 02/19/2020, 7:27 AM

## 2020-02-19 NOTE — Progress Notes (Signed)
Wife arrived a little bit ago. Called me to the room to get an update. When I arrived, she was in his face while he was sitting in his chair asking him if he wanted to have a bowel movement. Pt yelled back at her, "Back up. Leave me alone!" Wife telling me that he needs to have a bowel movement while patient says "No." Wife reluctantly stops insisting on bowel movement and let's patient rest in chair. Pt more agitated with wife here. Pt was not agitated the whole morning with me.

## 2020-02-19 NOTE — Progress Notes (Signed)
Subjective: Patient unchanged.  No complaints of headache today.    Objective: Current vital signs: BP 138/84   Pulse 95   Temp 98.5 F (36.9 C) (Oral)   Resp 16   Ht _0  (1.676 m)   Wt 54 kg   SpO2 100%   BMI 19.21 kg/m  Vital signs in last 24 hours: Temp:  [97.6 F (36.4 C)-99.1 F (37.3 C)] 98.5 F (36.9 C) (03/13 0755) Pulse Rate:  [95-122] 95 (03/13 0755) Resp:  [16-23] 16 (03/13 0340) BP: (119-151)/(77-93) 138/84 (03/13 0755) SpO2:  [99 %-100 %] 100 % (03/13 0755)  Intake/Output from previous day: 03/12 0701 - 03/13 0700 In: 800 [I.V.:800] Out: 610 [Urine:600; Blood:10] Intake/Output this shift: No intake/output data recorded. Nutritional status:  Diet Order            DIET DYS 3 Room service appropriate? Yes; Fluid consistency: Thin  Diet effective now              Neurologic Exam: Mental Status: Alert, confused.  Delayed responses to questioning.  Able to follow simple commands.  Speech minimal.  Cranial Nerves: II: Visual fields grossly normal III,IV, VI: ptosis not present, extra-ocular motions intact bilaterally V,VII: smile symmetric, facial light touch sensation normal bilaterally VIII: hearing normal bilaterally IX,X: gag reflex present XI: bilateral shoulder shrug XII: midline tongue extension Motor: Lifts all extremities against gravity with no focal weakness appreciated Sensory: Pinprick and light touch intact throughout, bilaterally   Lab Results: Basic Metabolic Panel: Recent Labs  Lab 02/14/20 0818 02/14/20 0818 02/16/20 0617 02/16/20 0617 02/17/20 0353 02/18/20 0603 02/19/20 0538  NA 133*  --  139  --  145 145 142  K 3.7  --  4.0  --  4.1 3.9 3.6  CL 100  --  99  --  105 106 105  CO2 26  --  29  --  _1 GLUCOSE 113*  --  103*  --  120* 101* 102*  BUN 22  --  24*  --  26* 27* 18  CREATININE 0.71  --  0.95  --  0.79 0.76 0.67  CALCIUM 8.5*   < > 9.5   < > 9.7 9.5 8.7*  MG  --   --   --   --   --  2.6* 2.4   < > =  values in this interval not displayed.    Liver Function Tests: No results for input(s): AST, ALT, ALKPHOS, BILITOT, PROT, ALBUMIN in the last 168 hours. No results for input(s): LIPASE, AMYLASE in the last 168 hours. No results for input(s): AMMONIA in the last 168 hours.  CBC: Recent Labs  Lab 02/13/20 0628 02/14/20 0818 02/17/20 0353 02/18/20 0603 02/19/20 0538  WBC 11.7* 11.1* 11.7* 13.8* 10.6*  NEUTROABS  --  9.8*  --   --   --   HGB 12.1* 12.3* 12.8* 12.6* 11.9*  HCT 35.7* 36.5* 38.6* 40.5 37.6*  MCV 97.8 98.4 100.3* 105.2* 102.7*  PLT 274 273 297 263 214    Cardiac Enzymes: No results for input(s): CKTOTAL, CKMB, CKMBINDEX, TROPONINI in the last 168 hours.  Lipid Panel: No results for input(s): CHOL, TRIG, HDL, CHOLHDL, VLDL, LDLCALC in the last 168 hours.  CBG: Recent Labs  Lab 02/18/20 1556  GLUCAP 102*    Microbiology: Results for orders placed or performed during the hospital encounter of 02/09/20  CULTURE, BLOOD (ROUTINE X 2) w Reflex to ID Panel  Status: None   Collection Time: 02/12/20  3:59 PM   Specimen: BLOOD  Result Value Ref Range Status   Specimen Description BLOOD RAC  Final   Special Requests   Final    BOTTLES DRAWN AEROBIC AND ANAEROBIC Blood Culture results may not be optimal due to an excessive volume of blood received in culture bottles   Culture   Final    NO GROWTH 5 DAYS Performed at [facility], [address] [place], [place] [address]    Report Status 02/17/2020 FINAL  Final  CULTURE, BLOOD (ROUTINE X 2) w Reflex to ID Panel     Status: None   Collection Time: 02/12/20  4:07 PM   Specimen: BLOOD RIGHT HAND  Result Value Ref Range Status   Specimen Description BLOOD RIGHT HAND [facility]  Final   Special Requests   Final    BOTTLES DRAWN AEROBIC AND ANAEROBIC Blood Culture adequate volume   Culture   Final    NO GROWTH 5 DAYS Performed at [facility], [address] [place], [place] [address]    Report  Status 02/17/2020 FINAL  Final  MRSA PCR Screening     Status: None   Collection Time: 02/13/20  3:03 PM   Specimen: Nasal Mucosa; Nasopharyngeal  Result Value Ref Range Status   MRSA by PCR NEGATIVE NEGATIVE Final    Comment:        The GeneXpert MRSA Assay (FDA approved for NASAL specimens only), is one component of a comprehensive MRSA colonization surveillance program. It is not intended to diagnose MRSA infection nor to guide or monitor treatment for MRSA infections. Performed at [facility], [address] [place], [place] [address]   Aspergillus Ag, BAL/Serum     Status: None   Collection Time: 02/16/20  6:17 AM   Specimen: Lung, Right  Result Value Ref Range Status   Aspergillus Ag, BAL/Serum 0.03 0.00 - 0.49 Index Final    Comment: (NOTE) Performed At: [facility] [address] [place], [place] [id number] Rush Farmer MD WU:[id number] Performed At: [facility] [address] [place], [place] [id number] Katina Degree MDPhD KV:[id number]   Culture, bal-quantitative     Status: None (Preliminary result)   Collection Time: 02/18/20 10:00 AM   Specimen: Bronchoalveolar Lavage  Result Value Ref Range Status   Specimen Description   Final    BRONCHIAL ALVEOLAR LAVAGE Performed at [facility], [address], [place], [place] [address]    Special Requests   Final    NONE Performed at [facility], [place], [place] [address]    Gram Stain NO WBC SEEN NO ORGANISMS SEEN   Final   Culture   Final    NO GROWTH < 24 HOURS Performed at [facility], [place] [address] [place], [place] [address]    Report Status PENDING  Incomplete  CSF culture     Status: Abnormal (Preliminary result)   Collection Time: 02/18/20  2:51 PM   Specimen: CSF; Cerebrospinal Fluid  Result Value Ref Range Status   Specimen Description CSF  Final   Special Requests NONE  Final   Gram Stain (A)  Final    YEAST CRITICAL RESULT  CALLED TO, READ BACK BY AND VERIFIED WITH: SERENITY KIRKENDALL AT 1600 02/18/20.PMF WBC SEEN RED BLOOD CELLS PRESENT Performed at [facility], [address] [place], [place] [id number]    Culture PENDING  Incomplete   Report Status  PENDING  Incomplete    Coagulation Studies: Recent Labs    02/17/20 1512  LABPROT 12.1  INR 0.9    Imaging: DG C-Arm 1-60 Min-No Report  Result Date: 02/18/2020 Fluoroscopy was utilized by the requesting physician.  No radiographic interpretation.   [place] EKG SITE RITE  Result Date: 02/18/2020 If Site Rite image not attached, placement could not be confirmed due to current cardiac rhythm.  DG FL GUIDED LUMBAR PUNCTURE  Result Date: 02/18/2020 CLINICAL DATA:  Confusion, cryptococcal antigen EXAM: DIAGNOSTIC LUMBAR PUNCTURE UNDER FLUOROSCOPIC GUIDANCE FLUOROSCOPY TIME:  Fluoroscopy Time:  00:30 Number of Acquired Spot Images: 6 PROCEDURE: Informed consent was obtained from the patient prior to the procedure, including potential complications of headache, allergy, and pain. With the patient prone, the lower back was prepped with Betadine. 1% Lidocaine was used for local anesthesia. Lumbar puncture was performed at the L3-L4 level using a 22 gauge needle with return of clear CSF with an opening pressure of less than 9 cm water. 8 ml of CSF were obtained for laboratory studies. The patient tolerated the procedure well and there were no apparent complications. IMPRESSION: Successful fluoroscopic lumbar puncture. 8 mL clear CSF obtained and submitted to the laboratory. Electronically Signed   By: Eddie Candle M.D.   On: 02/18/2020 14:57   CT Super D Chest W Contrast  Result Date: 02/17/2020 CLINICAL DATA:  74 year old male with altered mental status, history of multifocal pneumonia or in the setting of myasthenia gravis. EXAM: CT CHEST WITH CONTRAST TECHNIQUE: Multidetector CT imaging of the chest was performed using thin slice collimation for  electromagnetic bronchoscopy planning purposes, with intravenous contrast. CONTRAST:  25m OMNIPAQUE IOHEXOL 300 MG/ML  SOLN COMPARISON:  Prior PET-CT of 01/24/2020 FINDINGS: Cardiovascular: Ascending dilated to 3.5 cm. Scattered atherosclerosis summer is calcification. Heart size is normal without pericardial effusion. Scattered coronary artery calcification. Central pulmonary vasculature is normal. Mediastinum/Nodes: Small lymph nodes throughout the mediastinum unchanged since recent imaging studies largest 7 mm along the right paratracheal chain. Other smaller nodes track towards the thoracic inlet. No thoracic inlet adenopathy. No axillary lymphadenopathy. No nodal enlargement beyond a cm in the bilateral hila. Correlate with recent PET exam which shows right hilar FDG uptake. Lungs/Pleura: Signs of ground-glass, diminished in general since the previous exam now with more peripheral predominance. Right lower lobe nodule found to be FDG avid in the medial right lung base (image 242, series 5) 1.6 by 0.7 cm not changed accounting for the degree of respiratory motion and was present on the CT angiography there was performed January of 2021 though not well seen on this study due to the motion artifact. Spiculated nodule in the superior segment of the right lower lobe with some central area of added density suggesting some calcification. This measures 2.1 by 2.0 cm, showing a similar appearance. Development of some bronchiectatic changes since the prior study in the left upper lobe, lingula and left lower lobe. Septal thickening is more pronounced also with dilation of left upper lobe bronchi. Process shows a more apical and peripheral than basilar predominance and is occurred in the short interval. No consolidation. No pleural effusion. Upper Abdomen: Incidental imaging of upper abdominal contents is unremarkable. Musculoskeletal: Unchanged appearance of sclerotic foci and right-sided ribs. Signs of prior fracture  to left-sided ribs midthoracic compression fractures without interval change. IMPRESSION: 1. Signs of bronchiectatic changes and septal thickening more so than the ground-glass that was seen on the previous exam. Bronchiectasis was present previously and may be  slightly worse on today's exam though the lung parenchyma is better evaluated on today's study. Post inflammatory fibrosis is considered, by report in the medical record the patient has a history of COVID infection. 2. Spiculated nodule superior segment right lower lobe with central area of added density suggesting some calcification, wall finding remains concerning for bronchogenic neoplasm the possibility of sequela of granulomatous infection could also be considered. Biopsy is suggested. 3. Correlate with recent PET exam which shows right hilar FDG uptake. 4. Stable appearance of sclerotic foci in the right-sided ribs. 5. 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 Recommend annual imaging followup by CTA or MRA. This recommendation follows 2010 ACCF/AHA/AATS/ACR/ASA/SCA/SCAI/SIR/STS/SVM Guidelines for the Diagnosis and Management of Patients with Thoracic Aortic Disease. Circulation.2010; 121: W546-E703. Aortic aneurysm NOS (ICD10-I71.9) 6. Pulmonary embolism seen on prior exam is not demonstrated on the current exam. 7. Scattered coronary artery calcification. Electronically Signed   By: Zetta Bills M.D.   On: 02/17/2020 15:29    Medications:  I have reviewed the patient's current medications. Scheduled: . [START ON 02/20/2020] apixaban  5 mg Oral BID  . butamben-tetracaine-benzocaine  1 spray Topical Once  . Chlorhexidine Gluconate Cloth  6 each Topical Daily  . dextrose  10 mL Intravenous Q24H  . dextrose  10 mL Intravenous Q24H  . feeding supplement (ENSURE ENLIVE)  237 mL Oral TID BM  . flucytosine  25 mg/kg Oral Q6H  . lidocaine (PF)  30 mL Other Once  . lidocaine  1 application Topical Once  . multivitamin with minerals  1 tablet  Oral Daily  . predniSONE  15 mg Oral Q breakfast  . sodium chloride  500 mL Intravenous Q24H  . sodium chloride  500 mL Intravenous Q24H  . sodium chloride flush  10-40 mL Intracatheter Q12H  . sodium chloride  1 g Oral TID WC  . 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 MRI of the brain personally reviewed and shows several small areas of restricted diffusion in the cerebellum bilaterally.  Likely acute/subacute infarcts but can not rule out metastasis or infection.  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  With positive cryptococcus titers as well.  CSF performed with normal opening pressure.  Gram stain positive for yeast.  Protein elevated.  Glucose decreased.  Cytology pending.  Patient followed by ID and started on Amphotericin.  Presenting symptoms likely multifactorial in etiology and related to recent COVID infection, hyponatremia and cryptococcus infection.  Steroid taper has begun with plans to discontinue completely.  Recommendations: 1. 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  Would consider start of  Mestinon at that time. 2. Will follow up pending LP results.      LOS: 8 days   Alexis Goodell, MD Neurology [phone] 02/19/2020  11:08 AM

## 2020-02-19 NOTE — Progress Notes (Signed)
Physical Therapy Treatment Patient Details Name: Duane Santos MRN: [id number] DOB: [birth year] Today's Date: 02/19/2020    History of Present Illness Pt. presented to ER secondary to AMS, progressive weakness and repeated falls; admitted for management of acute metabolic encephalopathy.  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 PMH significant for MG, covid-19 infection (1/21) and newly-diagnosed R lung cancer.  s/p lung biopsy, pending LP for continued work up.    PT Comments    Pt was seated in recliner upon arriving. No family present during session however neurologist observed Pt during session. He continues to have cognition deficits limiting his progression with PT. Oriented to self only but was able to follow one step commands with increase time to processes. Resting vitals: HR 98bpm BP 132/82 and sao2 99%.  He was able to stand from recliner with min assist and ambulate with min assist to door prior to c/o dizziness. Pt was returned to recliner and vitals rechecked. BP 123/72 HR elevated to 119 and sao2 99%. Pt was on 2 L o2 nasal cannula throughout session. Pt reported feeling fatigued after limited gait distances. He continues to have unsteadiness with gait but demonstrates fair strength. Therapist did not notice significant improvements since LP. PT will continue to follow per POC and continue to recommend D/C to CIR when medically stable. Cognition continues to be most limiting factor for Pt progression. He was seated in recliner with call bell in lap, BLEs elevated, and chair alarm placed. Therapist discussed with RN post session.      Follow Up Recommendations  CIR;SNF     Equipment Recommendations  Rolling walker with 5" wheels;3in1 (PT)    Recommendations for Other Services       Precautions / Restrictions Precautions Precautions: Fall Restrictions Weight Bearing Restrictions: No    Mobility  Bed Mobility Overal bed  mobility: Needs Assistance             General bed mobility comments: pt was seated in recliner pre/post PT session  Transfers Overall transfer level: Needs assistance Equipment used: Rolling walker (2 wheeled) Transfers: Sit to/from Stand Sit to Stand: Min assist         General transfer comment: Pt was able to STS from recliner 3 x during session. Min assist to safely stand with slight posterior push this date. Vcs + increased time to process. Cued for proper handplacement and overall technique improvements.  Ambulation/Gait Ambulation/Gait assistance: Min assist Gait Distance (Feet): 30 Feet Assistive device: Rolling walker (2 wheeled) Gait Pattern/deviations: Step-through pattern Gait velocity: decreased   General Gait Details: Pt was able to ambulate in his room ~ 30 ft using RW prior to c/o dizziness. BP prior to ambulation 132/82 with sao2 99% on 2 L and HR 98bpm. After gait training BP 123/75 with HR elevation to 123bpm and sao2 99% on 2 L. Pt was returned to recliner with BLEs elevated until pt no longer reported dizziness.   Stairs             Wheelchair Mobility    Modified Rankin (Stroke Patients Only)       Balance                                            Cognition Arousal/Alertness: Awake/alert Behavior During Therapy: Flat affect Overall Cognitive Status: Impaired/Different from baseline Area of  Impairment: Orientation;Attention;Memory;Following commands;Safety/judgement;Awareness;Problem solving                 Orientation Level: Person Current Attention Level: Alternating Memory: Decreased short-term memory;Decreased recall of precautions Following Commands: Follows one step commands inconsistently(Increased time) Safety/Judgement: Decreased awareness of safety;Decreased awareness of deficits   Problem Solving: Slow processing;Decreased initiation;Difficulty sequencing;Requires verbal cues;Requires tactile  cues General Comments: oriented to self; follows very simple, verbal commands but requires incresaed time for processing and often therapist demonstration/hand-over-hand to fully comprehend and initiate/complete      Exercises      General Comments        Pertinent Vitals/Pain Pain Assessment: Faces Pain Score: 2  Pain Location: Posterior neck Pain Descriptors / Indicators: Aching    Home Living                      Prior Function            PT Goals (current goals can now be found in the care plan section) Acute Rehab PT Goals Patient Stated Goal: agreeable to session Progress towards PT goals: Progressing toward goals;Not progressing toward goals - comment(fatigue and cognition slowing progression )    Frequency    Min 2X/week      PT Plan Current plan remains appropriate    Co-evaluation     PT goals addressed during session: Mobility/safety with mobility        AM-PAC PT "6 Clicks" Mobility   Outcome Measure  Help needed turning from your back to your side while in a flat bed without using bedrails?: A Little Help needed moving from lying on your back to sitting on the side of a flat bed without using bedrails?: A Lot Help needed moving to and from a bed to a chair (including a wheelchair)?: A Little Help needed standing up from a chair using your arms (e.g., wheelchair or bedside chair)?: A Little Help needed to walk in hospital room?: A Little Help needed climbing 3-5 steps with a railing? : A Little 6 Click Score: 17    End of Session Equipment Utilized During Treatment: Gait belt;Oxygen Activity Tolerance: Patient tolerated treatment well;Patient limited by fatigue Patient left: in chair;with call bell/phone within reach;with chair alarm set Nurse Communication: Mobility status PT Visit Diagnosis: Muscle weakness (generalized) (M62.81);Other abnormalities of gait and mobility (R26.89);Unsteadiness on feet (R26.81);Other symptoms and signs  involving the nervous system (R29.898);Repeated falls (R29.6)     Time: 5638-9373 PT Time Calculation (min) (ACUTE ONLY): 23 min  Charges:  $Gait Training: 8-22 mins $Therapeutic Activity: 8-22 mins                     Julaine Fusi PTA 02/19/20, 12:00 PM

## 2020-02-19 NOTE — Progress Notes (Signed)
Pharmacy - Liposomal Amphotericin Monitoring  32 YOM with myasthenia gravis on prednisone/mycophenolate.  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  Started on amphotericin - liposomal and flucytosine evening on 3/1. Patient for lung biopsy and LP today.    Labs 02/19/2020 - K = 3.6 - Mg = 2.4 - SCr = 0.67   Plan:  Continue Ampohtericin 200mg  (3.7mg /kg) IV q24h  Normal saline 567ml bolus pre and post dose with D5W 23ml flushes in between boluses  Flucytosine 1250mg  (23mg /kg) PO q6h   Check daily BMP and Mg and replete K+ and Mg daily as needed as both amphotericin and flucytosine can cause hypokalemia  Monitor renal function  Monitor CBC and LFTs periodically (flucytosine can cause hepatoxicity and bone marrow suppression).   CSF cx= yeast  HIV pending  Zosyn 3.375gm IV q8h over 4h infusion remains appropriate  Chinita Greenland PharmD Clinical Pharmacist 02/19/2020

## 2020-02-20 LAB — CBC
HCT: 38.4 % — ABNORMAL LOW (ref 39.0–52.0)
Hemoglobin: 12.8 g/dL — ABNORMAL LOW (ref 13.0–17.0)
MCH: 33.3 pg (ref 26.0–34.0)
MCHC: 33.3 g/dL (ref 30.0–36.0)
MCV: 100 fL (ref 80.0–100.0)
Platelets: 232 10*3/uL (ref 150–400)
RBC: 3.84 MIL/uL — ABNORMAL LOW (ref 4.22–5.81)
RDW: 15.3 % (ref 11.5–15.5)
WBC: 9.4 10*3/uL (ref 4.0–10.5)
nRBC: 0 % (ref 0.0–0.2)

## 2020-02-20 LAB — BASIC METABOLIC PANEL
Anion gap: 7 (ref 5–15)
BUN: 20 mg/dL (ref 8–23)
CO2: 30 mmol/L (ref 22–32)
Calcium: 9.4 mg/dL (ref 8.9–10.3)
Chloride: 104 mmol/L (ref 98–111)
Creatinine, Ser: 0.66 mg/dL (ref 0.61–1.24)
GFR calc Af Amer: >60 mL/min (ref >60)
GFR calc non Af Amer: >60 mL/min (ref >60)
Glucose, Bld: 106 mg/dL — ABNORMAL HIGH (ref 70–99)
Potassium: 3.5 mmol/L (ref 3.5–5.1)
Sodium: 141 mmol/L (ref 135–145)

## 2020-02-20 LAB — HSV DNA BY PCR (REFERENCE LAB)
HSV 1 DNA: NEGATIVE
HSV 2 DNA: NEGATIVE

## 2020-02-20 LAB — CULTURE, BAL-QUANTITATIVE W GRAM STAIN
Culture: NO GROWTH
Gram Stain: NONE SEEN

## 2020-02-20 LAB — MAGNESIUM: Magnesium: 2.3 mg/dL (ref 1.7–2.4)

## 2020-02-20 MED ORDER — POTASSIUM CHLORIDE CRYS ER 10 MEQ PO TBCR
20.0000 meq | EXTENDED_RELEASE_TABLET | Freq: Once | ORAL | Status: AC
  Administered 2020-02-20: 20 meq via ORAL
  Filled 2020-02-20: qty 2

## 2020-02-20 NOTE — Progress Notes (Signed)
PROGRESS NOTE    Duane Santos  FGH:[id number] DOB: 07/01/1946 DOA: 02/09/2020 PCP: [facility]      Assessment & Plan:   Principal Problem:   Acute confusional state Active Problems:   Multifocal pneumonia   Myasthenia gravis ([place])   Right lower lobe lung mass   Generalized weakness   Hyponatremia   History of COVID-19 pneumonia with hypoxia   Acute metabolic encephalopathy   Chronic respiratory failure with hypoxia ([place])   Palliative care encounter   Acute confusional state: etiology unclear, subacute CVA vs mets as per MRI. Intermittent, oriented to person, month, year only. Echo shows EF 65-70%, interatrial septum was not well visualized. 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 Continue on amphotericin B, flucytosine as per ID. S/p LP w/ cultures growing yeast, but not finalized   Subacute CVA vs metastatic disease: as per MRI. Continue w/ neuro checks. PT/OT initially recommending SNF, now PT recs "CIR as per wife's request"   Multifocal pneumonia: w/ recurrent fevers & probable hospital-acquired, immunocompromised state. No fevers in at least 24 hours. 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 Continue fluid restriction and salt tablets.   Right lung mass: suspicious for malignancy, sclerotic changes of the right third fourth and fifth ribs suspicious for metastatic disease.  S/p bronchoscopy w/ biopsy on 02/18/20, path pending. Follow-up with palliative care. Pulmon following and recs apprec  Recent pulmonary embolism and DVT: diagnosed on December 23, 2019. Restarted back on eliquis.   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 Recommend annual imaging followup by CTA or MRA  Depression: psychiatry consulted as per wife's request. Psych  following and recs apprec   Generalized weakness/debility: PT/OT initially recommending SNF, now PT recs "CIR as per wife's request". BMI 19.21    DVT prophylaxis: eliquis  Code Status: DNR Family Communication: discussed pt's care w/ pt's wife, Maudie Mercury, and answered her questions  Disposition Plan:  PT/OT initially recommending SNF, now PT recs "CIR as per wife's request"; disposition is unknown at this point   Consultants:   Pulmon  Palliative care  Psychiatry   Nephro  Neuro  ID   Procedures:    Antimicrobials: zosyn   Subjective: Pt c/o back pain.   Objective: Vitals:   02/19/20 0755 02/19/20 1244 02/19/20 1936 02/19/20 2248  BP: 138/84 (!) 108/91 128/74 (!) 145/85  Pulse: 95 (!) 105 92 (!) 108  Resp:   18   Temp: 98.5 F (36.9 C) 97.6 F (36.4 C) 98.3 F (36.8 C) 98.5 F (36.9 C)  TempSrc: Oral Oral Oral Oral  SpO2: 100% 97% 99% 100%  Weight:      Height:        Intake/Output Summary (Last 24 hours) at 02/20/2020 0724 Last data filed at 02/20/2020 0500 Gross per 24 hour  Intake 1894.64 ml  Output 300 ml  Net 1594.64 ml   Filed Weights   02/09/20 1656  Weight: 54 kg    Examination:  General exam: Appears calm and comfortable  Respiratory system: diminished breath sounds b/l. No rales Cardiovascular system: S1 & S2. No rubs, gallops or clicks.  Gastrointestinal system: Abdomen is nondistended, soft and nontender. Hypoactive bowel sounds heard.  Central nervous system: Alert and awake. Moves all 4 extremities  Psychiatry: Judgement and insight appear abnormal. Flat mood and affect  Data Reviewed: I have personally reviewed following labs and imaging studies  CBC: Recent Labs  Lab 02/14/20 0818 02/17/20 0353 02/18/20 0603 02/19/20 0538 02/20/20 0522  WBC 11.1* 11.7* 13.8* 10.6* 9.4  NEUTROABS 9.8*  --   --   --   --   HGB 12.3* 12.8* 12.6* 11.9* 12.8*  HCT 36.5* 38.6* 40.5 37.6* 38.4*  MCV 98.4 100.3* 105.2* 102.7* 100.0  PLT 273  297 263 214 542   Basic Metabolic Panel: Recent Labs  Lab 02/16/20 0617 02/17/20 0353 02/18/20 0603 02/19/20 0538 02/20/20 0522  NA 139 145 145 142 141  K 4.0 4.1 3.9 3.6 3.5  CL 99 105 106 105 104  CO2 _0 GLUCOSE 103* 120* 101* 102* 106*  BUN 24* 26* 27* 18 20  CREATININE 0.95 0.79 0.76 0.67 0.66  CALCIUM 9.5 9.7 9.5 8.7* 9.4  MG  --   --  2.6* 2.4 2.3   GFR: Estimated Creatinine Clearance: 63.8 mL/min (by C-G formula based on SCr of 0.66 mg/dL). Liver Function Tests: No results for input(s): AST, ALT, ALKPHOS, BILITOT, PROT, ALBUMIN in the last 168 hours. No results for input(s): LIPASE, AMYLASE in the last 168 hours. No results for input(s): AMMONIA in the last 168 hours. Coagulation Profile: Recent Labs  Lab 02/17/20 1512  INR 0.9   Cardiac Enzymes: No results for input(s): CKTOTAL, CKMB, CKMBINDEX, TROPONINI in the last 168 hours. BNP (last 3 results) No results for input(s): PROBNP in the last 8760 hours. HbA1C: No results for input(s): HGBA1C in the last 72 hours. CBG: Recent Labs  Lab 02/18/20 1556  GLUCAP 102*   Lipid Profile: No results for input(s): CHOL, HDL, LDLCALC, TRIG, CHOLHDL, LDLDIRECT in the last 72 hours. Thyroid Function Tests: No results for input(s): TSH, T4TOTAL, FREET4, T3FREE, THYROIDAB in the last 72 hours. Anemia Panel: No results for input(s): VITAMINB12, FOLATE, FERRITIN, TIBC, IRON, RETICCTPCT in the last 72 hours. Sepsis Labs: No results for input(s): PROCALCITON, LATICACIDVEN in the last 168 hours.  Recent Results (from the past 240 hour(s))  CULTURE, BLOOD (ROUTINE X 2) w Reflex to ID Panel     Status: None   Collection Time: 02/12/20  3:59 PM   Specimen: BLOOD  Result Value Ref Range Status   Specimen Description BLOOD RAC  Final   Special Requests   Final    BOTTLES DRAWN AEROBIC AND ANAEROBIC Blood Culture results may not be optimal due to an excessive volume of blood received in culture bottles   Culture    Final    NO GROWTH 5 DAYS Performed at [facility], [address] [place], [place] [address]    Report Status 02/17/2020 FINAL  Final  CULTURE, BLOOD (ROUTINE X 2) w Reflex to ID Panel     Status: None   Collection Time: 02/12/20  4:07 PM   Specimen: BLOOD RIGHT HAND  Result Value Ref Range Status   Specimen Description BLOOD RIGHT HAND [facility]  Final   Special Requests   Final    BOTTLES DRAWN AEROBIC AND ANAEROBIC Blood Culture adequate volume   Culture   Final    NO GROWTH 5 DAYS Performed at [facility], [address] [place], [place] [address]    Report Status 02/17/2020 FINAL  Final  MRSA PCR Screening     Status: None   Collection Time: 02/13/20  3:03 PM   Specimen: Nasal Mucosa; Nasopharyngeal  Result Value Ref Range Status  MRSA by PCR NEGATIVE NEGATIVE Final    Comment:        The GeneXpert MRSA Assay (FDA approved for NASAL specimens only), is one component of a comprehensive MRSA colonization surveillance program. It is not intended to diagnose MRSA infection nor to guide or monitor treatment for MRSA infections. Performed at [facility], [address] [place], [place] [address]   Aspergillus Ag, BAL/Serum     Status: None   Collection Time: 02/16/20  6:17 AM   Specimen: Lung, Right  Result Value Ref Range Status   Aspergillus Ag, BAL/Serum 0.03 0.00 - 0.49 Index Final    Comment: (NOTE) Performed At: [facility] [address] [place], [place] [id number] Rush Farmer MD [id number] Performed At: [facility] [address] [place], [place] [id number] Katina Degree MDPhD [id number]   Culture, bal-quantitative     Status: None (Preliminary result)   Collection Time: 02/18/20 10:00 AM   Specimen: Bronchoalveolar Lavage  Result Value Ref Range Status   Specimen Description   Final    BRONCHIAL ALVEOLAR LAVAGE Performed at [facility], [address], [place], [place] [address]    Special  Requests   Final    NONE Performed at [facility], [place], [place] [address]    Gram Stain NO WBC SEEN NO ORGANISMS SEEN   Final   Culture   Final    NO GROWTH < 24 HOURS Performed at [facility], [place] [address] [place], [place] [address]    Report Status PENDING  Incomplete  Culture, fungus without smear     Status: None (Preliminary result)   Collection Time: 02/18/20 10:02 AM   Specimen: Lung; Cerebrospinal Fluid  Result Value Ref Range Status   Specimen Description   Final    LUNG Performed at [facility], [address] [place], [place] [address]    Special Requests   Final    LLL Performed at [facility], [address], [place], [place] [address]    Culture   Final    NO FUNGUS ISOLATED AFTER 1 DAY Performed at [facility], [place] [address] [place], [place] [address]    Report Status PENDING  Incomplete  CSF culture     Status: Abnormal (Preliminary result)   Collection Time: 02/18/20  2:51 PM   Specimen: CSF; Cerebrospinal Fluid  Result Value Ref Range Status   Specimen Description CSF  Final   Special Requests NONE  Final   Gram Stain (A)  Final    YEAST CRITICAL RESULT CALLED TO, READ BACK BY AND VERIFIED WITH: SERENITY KIRKENDALL AT 1600 02/18/20.PMF WBC SEEN RED BLOOD CELLS PRESENT Performed at [facility], [address] [place], [place] [address]    Culture PENDING  Incomplete   Report Status PENDING  Incomplete         Radiology Studies: DG C-Arm 1-60 Min-No Report  Result Date: 02/18/2020 Fluoroscopy was utilized by the requesting physician.  No radiographic interpretation.   [place] EKG SITE RITE  Result Date: 02/18/2020 If Site Rite image not attached, placement could not be confirmed due to current cardiac rhythm.  DG FL GUIDED LUMBAR PUNCTURE  Result Date: 02/18/2020 CLINICAL DATA:  Confusion, cryptococcal antigen EXAM: DIAGNOSTIC LUMBAR PUNCTURE UNDER FLUOROSCOPIC  GUIDANCE FLUOROSCOPY TIME:  Fluoroscopy Time:  00:30 Number of Acquired Spot Images: 6 PROCEDURE: Informed consent was obtained from the patient prior to the procedure, including potential complications of  headache, allergy, and pain. With the patient prone, the lower back was prepped with Betadine. 1% Lidocaine was used for local anesthesia. Lumbar puncture was performed at the L3-L4 level using a 22 gauge needle with return of clear CSF with an opening pressure of less than 9 cm water. 8 ml of CSF were obtained for laboratory studies. The patient tolerated the procedure well and there were no apparent complications. IMPRESSION: Successful fluoroscopic lumbar puncture. 8 mL clear CSF obtained and submitted to the laboratory. Electronically Signed   By: Eddie Candle M.D.   On: 02/18/2020 14:57        Scheduled Meds: . apixaban  5 mg Oral BID  . butamben-tetracaine-benzocaine  1 spray Topical Once  . Chlorhexidine Gluconate Cloth  6 each Topical Daily  . dextrose  10 mL Intravenous Q24H  . dextrose  10 mL Intravenous Q24H  . feeding supplement (ENSURE ENLIVE)  237 mL Oral TID BM  . flucytosine  25 mg/kg Oral Q6H  . lidocaine (PF)  30 mL Other Once  . lidocaine  1 application Topical Once  . multivitamin with minerals  1 tablet Oral Daily  . predniSONE  15 mg Oral Q breakfast  . sodium chloride  500 mL Intravenous Q24H  . sodium chloride  500 mL Intravenous Q24H  . sodium chloride flush  10-40 mL Intracatheter Q12H  . sodium chloride  1 g Oral TID WC  . thiamine  100 mg Oral Daily   Continuous Infusions: . amphotericin  B  Liposome (AMBISOME) ADULT IV Stopped (02/20/20 0047)  . piperacillin-tazobactam (ZOSYN)  IV 3.375 g (02/20/20 0347)     LOS: 9 days    Time spent: 30 mins     Wyvonnia Dusky, MD [facility] Pager [phone]  If 7PM-7AM, please contact night-coverage [email or web address] 02/20/2020, 7:24 AM

## 2020-02-20 NOTE — Consult Note (Signed)
Discussed this patient with Dr. Claris Gower as he has been following this patient throughout the week.  At this time and is not warranted to start any additional psychotropic medications with the patient's current status.  Once patient is medically stable then Dr. Claris Gower will evaluate this patient.

## 2020-02-20 NOTE — Progress Notes (Signed)
Pharmacy - Liposomal Amphotericin Monitoring  86 YOM with myasthenia gravis on prednisone/mycophenolate.  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  Started on amphotericin - liposomal and flucytosine evening on 3/1.   Labs 02/20/2020 - K = 3.5 - Mg = 2.3 - SCr = 0.66   Plan:  Continue Ampohtericin 200mg  (3.7mg /kg) IV q24h  Normal saline 576ml bolus pre and post dose with D5W 33ml flushes in between boluses  Flucytosine 1250mg  (23mg /kg) PO q6h   Check daily BMP and Mg and replete K+ and Mg daily as needed as both amphotericin and flucytosine can cause hypokalemia. K trending down- will give KCL PO 20 meq x 1 dose 3/14  Monitor renal function  Monitor CBC and LFTs periodically (flucytosine can cause hepatoxicity and bone marrow suppression).   CSF cx= yeast  HIV pending Zosyn 3.375gm IV q8h over 4h infusion   Chinita Greenland PharmD Clinical Pharmacist 02/20/2020

## 2020-02-20 NOTE — Progress Notes (Signed)
End of Shift Summary:  Date: 02/20/20 Shift: 0700-1900 Ambulatory: up to chair x1 assist Significant Events: Patient in chair this morning and for lunch. Wife quietly at bedside for much of the day. VSS.

## 2020-02-21 LAB — PATHOLOGIST SMEAR REVIEW

## 2020-02-21 LAB — CYTOLOGY - NON PAP

## 2020-02-21 LAB — BASIC METABOLIC PANEL
Anion gap: 11 (ref 5–15)
BUN: 29 mg/dL — ABNORMAL HIGH (ref 8–23)
CO2: 27 mmol/L (ref 22–32)
Calcium: 9.6 mg/dL (ref 8.9–10.3)
Chloride: 105 mmol/L (ref 98–111)
Creatinine, Ser: 0.84 mg/dL (ref 0.61–1.24)
GFR calc Af Amer: >60 mL/min (ref >60)
GFR calc non Af Amer: >60 mL/min (ref >60)
Glucose, Bld: 119 mg/dL — ABNORMAL HIGH (ref 70–99)
Potassium: 3.7 mmol/L (ref 3.5–5.1)
Sodium: 143 mmol/L (ref 135–145)

## 2020-02-21 LAB — CBC
HCT: 37.8 % — ABNORMAL LOW (ref 39.0–52.0)
Hemoglobin: 12.5 g/dL — ABNORMAL LOW (ref 13.0–17.0)
MCH: 33 pg (ref 26.0–34.0)
MCHC: 33.1 g/dL (ref 30.0–36.0)
MCV: 99.7 fL (ref 80.0–100.0)
Platelets: 242 10*3/uL (ref 150–400)
RBC: 3.79 MIL/uL — ABNORMAL LOW (ref 4.22–5.81)
RDW: 15.9 % — ABNORMAL HIGH (ref 11.5–15.5)
WBC: 8.8 10*3/uL (ref 4.0–10.5)
nRBC: 0 % (ref 0.0–0.2)

## 2020-02-21 LAB — MAGNESIUM: Magnesium: 2.7 mg/dL — ABNORMAL HIGH (ref 1.7–2.4)

## 2020-02-21 LAB — SURGICAL PATHOLOGY

## 2020-02-21 LAB — VDRL, CSF: VDRL Quant, CSF: NONREACTIVE

## 2020-02-21 MED ORDER — POTASSIUM CHLORIDE CRYS ER 20 MEQ PO TBCR
20.0000 meq | EXTENDED_RELEASE_TABLET | Freq: Once | ORAL | Status: AC
  Administered 2020-02-21: 20 meq via ORAL
  Filled 2020-02-21: qty 1

## 2020-02-21 MED ORDER — PREDNISONE 10 MG PO TABS
10.0000 mg | ORAL_TABLET | Freq: Every day | ORAL | Status: DC
  Administered 2020-02-22 – 2020-02-23 (×2): 10 mg via ORAL
  Filled 2020-02-21 (×3): qty 1

## 2020-02-21 NOTE — Progress Notes (Signed)
Pharmacy - Liposomal Amphotericin Monitoring  37 YOM with myasthenia gravis on prednisone/mycophenolate.  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  Started on amphotericin - liposomal and flucytosine evening on 3/11. He had LP and BAL performed 3/12 - CSF cx with yeast .  Lung cx unrevealing  Labs 02/21/2020 - K = 3.7 - Mg = 2.7 - SCr = 0.84 - slightly higher vs. previous two days (0.66 mg/dl)   Plan:  Continue Ampohtericin 215m (3.762mkg) IV q24h  Normal saline 50066molus pre and post dose with D5W 59m60mushes in between boluses  Flucytosine 1250mg66mmg/42mPO q6h   Check daily BMP and Mg and replete K+ and Mg daily as needed as both amphotericin and flucytosine can cause hypokalemia.  K at low end of normal- will give KCL PO 20 meq x 1 dose 3/15 to prevent from falling < 3.5  Monitor renal function  Monitor CBC and LFTs periodically (flucytosine can cause hepatoxicity and bone marrow suppression).   Zosyn 3.375gm IV q8h over 4h infusion - f/u plan/legnth of therapy  DustinDoreene ElandmD, BCPS.   Work Cell: [phone] 8:33 AM

## 2020-02-21 NOTE — Care Management Important Message (Signed)
Important Message  Patient Details  Name: Duane Santos MRN: [id number] Date of Birth: 1946-09-25   Medicare Important Message Given:  Yes     Juliann Pulse A Sundeep Destin 02/21/2020, 11:33 AM

## 2020-02-21 NOTE — Progress Notes (Signed)
[facility] INFECTIOUS DISEASE PROGRESS NOTE Date of Admission:  02/09/2020     ID: Duane Santos is a 74 y.o. male with AMS Principal Problem:   Acute confusional state Active Problems:   Multifocal pneumonia   Myasthenia gravis ([place])   Right lower lobe lung mass   Generalized weakness   Hyponatremia   History of COVID-19 pneumonia with hypoxia   Acute metabolic encephalopathy   Chronic respiratory failure with hypoxia ([facility])   Palliative care encounter   Subjective: Remains confused, no fevers . ROS  Unable to obtain  Medications:  Antibiotics Given (last 72 hours)    Date/Time Action Medication Dose Rate   02/18/20 1831 New Bag/Given   piperacillin-tazobactam (ZOSYN) IVPB 3.375 g 3.375 g 100 mL/hr   02/19/20 0113 New Bag/Given   piperacillin-tazobactam (ZOSYN) IVPB 3.375 g 3.375 g 12.5 mL/hr   02/19/20 0102 New Bag/Given   piperacillin-tazobactam (ZOSYN) IVPB 3.375 g 3.375 g 12.5 mL/hr   02/19/20 1606 New Bag/Given   piperacillin-tazobactam (ZOSYN) IVPB 3.375 g 3.375 g 12.5 mL/hr   02/20/20 0347 New Bag/Given   piperacillin-tazobactam (ZOSYN) IVPB 3.375 g 3.375 g 12.5 mL/hr   02/20/20 7253 New Bag/Given   piperacillin-tazobactam (ZOSYN) IVPB 3.375 g 3.375 g 12.5 mL/hr   02/20/20 1750 New Bag/Given   piperacillin-tazobactam (ZOSYN) IVPB 3.375 g 3.375 g 12.5 mL/hr   02/21/20 0128 New Bag/Given   piperacillin-tazobactam (ZOSYN) IVPB 3.375 g 3.375 g 12.5 mL/hr     . apixaban  5 mg Oral BID  . butamben-tetracaine-benzocaine  1 spray Topical Once  . Chlorhexidine Gluconate Cloth  6 each Topical Daily  . dextrose  10 mL Intravenous Q24H  . dextrose  10 mL Intravenous Q24H  . feeding supplement (ENSURE ENLIVE)  237 mL Oral TID BM  . flucytosine  25 mg/kg Oral Q6H  . lidocaine (PF)  30 mL Other Once  . lidocaine  1 application Topical Once  . multivitamin with minerals  1 tablet Oral Daily  . potassium chloride  20 mEq Oral Once  . predniSONE  15 mg Oral Q  breakfast  . sodium chloride  500 mL Intravenous Q24H  . sodium chloride  500 mL Intravenous Q24H  . sodium chloride flush  10-40 mL Intracatheter Q12H  . sodium chloride  1 g Oral TID WC  . thiamine  100 mg Oral Daily    Objective: Vital signs in last 24 hours: Temp:  [97.4 F (36.3 C)-98.2 F (36.8 C)] 98.2 F (36.8 C) (03/15 0753) Pulse Rate:  [109-113] 109 (03/15 0753) Resp:  [18] 18 (03/14 2318) BP: (128-144)/(80-95) 144/95 (03/15 0753) SpO2:  [97 %-100 %] 100 % (03/15 0753) Constitutional: He is thin, on O2,confused and unable to give a history HENT: anicteric Mouth/Throat: Oropharynx is clear and moist. Cardiovascular: Normal rate, regular rhythm and normal heart sounds. Exam reveals no gallop and no friction rub. No murmur heard.  Pulmonary/Chest: rhonchi bil Abdominal: Soft. Bowel sounds are normal. He exhibits no distension. There is no tenderness.  Lymphadenopathy:  He has no cervical adenopathy.  Neurological: able to move all 4, confused but trying to think of answers when questions asked Skin: Skin is warm and dry. No rash noted. No erythema.  Psychiatric: confused  Lab Results Recent Labs    02/20/20 0522 02/21/20 0615  WBC 9.4 8.8  HGB 12.8* 12.5*  HCT 38.4* 37.8*  NA 141 143  K 3.5 3.7  CL 104 105  CO2 30 27  BUN 20 29*  CREATININE 0.66 0.84    Microbiology: Results for orders placed or performed during the hospital encounter of 02/09/20  CULTURE, BLOOD (ROUTINE X 2) w Reflex to ID Panel     Status: None   Collection Time: 02/12/20  3:59 PM   Specimen: BLOOD  Result Value Ref Range Status   Specimen Description BLOOD RAC  Final   Special Requests   Final    BOTTLES DRAWN AEROBIC AND ANAEROBIC Blood Culture results may not be optimal due to an excessive volume of blood received in culture bottles   Culture   Final    NO GROWTH 5 DAYS Performed at [facility], [address] [place], [place] [address]    Report Status 02/17/2020  FINAL  Final  CULTURE, BLOOD (ROUTINE X 2) w Reflex to ID Panel     Status: None   Collection Time: 02/12/20  4:07 PM   Specimen: BLOOD RIGHT HAND  Result Value Ref Range Status   Specimen Description BLOOD RIGHT HAND [facility]  Final   Special Requests   Final    BOTTLES DRAWN AEROBIC AND ANAEROBIC Blood Culture adequate volume   Culture   Final    NO GROWTH 5 DAYS Performed at [facility], [address] [place], [place] [address]    Report Status 02/17/2020 FINAL  Final  MRSA PCR Screening     Status: None   Collection Time: 02/13/20  3:03 PM   Specimen: Nasal Mucosa; Nasopharyngeal  Result Value Ref Range Status   MRSA by PCR NEGATIVE NEGATIVE Final    Comment:        The GeneXpert MRSA Assay (FDA approved for NASAL specimens only), is one component of a comprehensive MRSA colonization surveillance program. It is not intended to diagnose MRSA infection nor to guide or monitor treatment for MRSA infections. Performed at [facility] [address] [place], [place] [address]   Aspergillus Ag, BAL/Serum     Status: None   Collection Time: 02/16/20  6:17 AM   Specimen: Lung, Right  Result Value Ref Range Status   Aspergillus Ag, BAL/Serum 0.03 0.00 - 0.49 Index Final    Comment: (NOTE) Performed At: [facility] [address] [place], [place] [id number] Rush Farmer MD [id number] Performed At: [facility] [address] [place], [place] [id number] Katina Degree MDPhD [id number]   Culture, bal-quantitative     Status: None   Collection Time: 02/18/20 10:00 AM   Specimen: Bronchoalveolar Lavage  Result Value Ref Range Status   Specimen Description   Final    BRONCHIAL ALVEOLAR LAVAGE Performed at [facility], [address], [place], [place] [address]    Special Requests   Final    NONE Performed at [facility], [address] [place], [place] [address]    Gram Stain NO WBC SEEN NO ORGANISMS SEEN   Final    Culture   Final    NO GROWTH 2 DAYS Performed at [facility], [place] [address], [place], [place] [address]    Report Status 02/20/2020 FINAL  Final  Culture, fungus without smear     Status: None (Preliminary result)   Collection Time: 02/18/20 10:02 AM   Specimen: Lung; Cerebrospinal Fluid  Result Value Ref Range Status   Specimen Description   Final    LUNG Performed at [facility], [address], [place], [place] [address]    Special Requests   Final    LLL Performed  at [facility], [address], [place], [place] [address]    Culture   Final    NO FUNGUS ISOLATED AFTER 2 DAYS Performed at [facility], [place] [address], [place], [place] [address]    Report Status PENDING  Incomplete  CSF culture     Status: Abnormal (Preliminary result)   Collection Time: 02/18/20  2:51 PM   Specimen: CSF; Cerebrospinal Fluid  Result Value Ref Range Status   Specimen Description CSF  Final   Special Requests NONE  Final   Gram Stain (A)  Final    YEAST CRITICAL RESULT CALLED TO, READ BACK BY AND VERIFIED WITH: SERENITY KIRKENDALL AT 1600 02/18/20.PMF WBC SEEN RED BLOOD CELLS PRESENT Performed at [facility], [address] [place], [place] [address]    Culture PENDING  Incomplete   Report Status PENDING  Incomplete    Studies/Results: No results found.  Assessment/Plan: Duane Santos is a 74 y.o. 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 So far MRI brain with possible cerebellar subacute infarct, met or artifact, EEG non diagnostic, BCX neg, echo neg.  The differential dx for his fevers, confusion and pneumonia are broad in this immunocompromised patient and include typical etiologies of viral encephalitis as well as fungal causes. CNS mets  or paraneoplastic encephalitis also possible. He is not septic and cultures negative, unlikely to be pyogenic bacterial infection. 3/11- no fevers, wbc 11. Remains confused.   CRAG very high on serum  3/12- bronch with findings of crypto on cytopath 3/15. No fevers. LP with markedly elevated protein. Op was only 9.  Yeast noted on gram stain. Remains on ampho and 78f. Picc Recommendations Cont amphotericin and 5Fc. Close following of safety labs, lytes, renal and cell counts Will need 2 weeks IV at least. He should remain inpatient to receive this until he has clinical response. All his path, CSF, bronch etc show Crypto. He has severe disseminated crypto. [facility] agrees need to decrease steroids and wean off if possible. Today will decrease pred from 15 mg to  10 mg Can wean as possible given risk of adrenal insuffiencey. Monitor for signs of MG flare but most important to limit immunosuppression Thank you very much for the consult. Will follow with you.  DLeonel Ramsay  02/21/2020, 9:26 AM

## 2020-02-21 NOTE — Progress Notes (Addendum)
Physical Therapy Treatment Patient Details Name: Duane Santos MRN: [id number] DOB: 07-Feb-1946 Today's Date: 02/21/2020    History of Present Illness Pt. presented to ER secondary to AMS, progressive weakness and repeated falls; admitted for management of acute metabolic encephalopathy.  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 PMH significant for MG, covid-19 infection (1/21) and newly-diagnosed R lung cancer.  s/p lung biopsy, pending LP for continued work up.    PT Comments    Pt is making good progress towards goals with improved ability to ambulate hallway distances this date. Elevated response via HR to 153bpm with exertion with pt visibly fatigued. Reports being dizzy with mobility, however unable to further describe. O2 sats at 95% post exertion on RA. Pt still required manual assistance for RW management demonstrating decreased carry over via education on AD. Able to participate and follow simple commands for "big movement" there-ex for larger muscles. Still has difficulty performing smaller range there-ex. More conversational this date. Decreased physical assistance needed although +2 good to have due to confusion. Will continue to progress as able.    Follow Up Recommendations  CIR;SNF     Equipment Recommendations  Rolling walker with 5" wheels;3in1 (PT)    Recommendations for Other Services       Precautions / Restrictions Precautions Precautions: Fall Restrictions Weight Bearing Restrictions: No    Mobility  Bed Mobility               General bed mobility comments: not performed as pt received in recliner  Transfers Overall transfer level: Needs assistance Equipment used: Rolling walker (2 wheeled) Transfers: Sit to/from Stand Sit to Stand: Min assist;+2 safety/equipment         General transfer comment: min assist for technique as pt is slightly impulsive. Donned diaper for all  mobility  Ambulation/Gait Ambulation/Gait assistance: Min assist Gait Distance (Feet): 120 Feet Assistive device: Rolling walker (2 wheeled) Gait Pattern/deviations: Step-through pattern     General Gait Details: ambulated with +2 assist secondary for equipment. Pt agreeable to increased distance despite feeling dizzy this date. Vitals WNL earlier per chart review. Pt with reciprocal gait pattern although unsteady and needs cues for RW management while turning in hallway. Fatigues with increased distance with HR increasing to 153bpm with exertion.   Stairs             Wheelchair Mobility    Modified Rankin (Stroke Patients Only)       Balance Overall balance assessment: Needs assistance Sitting-balance support: No upper extremity supported;Feet supported Sitting balance-Leahy Scale: Good     Standing balance support: Bilateral upper extremity supported Standing balance-Leahy Scale: Good Standing balance comment: able to maintain balance while donning diaper                            Cognition Arousal/Alertness: Awake/alert Behavior During Therapy: WFL for tasks assessed/performed Overall Cognitive Status: Impaired/Different from baseline Area of Impairment: Orientation;Attention;Memory;Following commands                 Orientation Level: Person Current Attention Level: Alternating Memory: Decreased short-term memory Following Commands: Follows one step commands consistently Safety/Judgement: Decreased awareness of safety;Decreased awareness of deficits   Problem Solving: Slow processing;Decreased initiation;Difficulty sequencing;Requires verbal cues;Requires tactile cues General Comments: Has limited recall of previous sessions, however ability to carry simple conversation this date.      Exercises Other Exercises Other Exercises: Completed grooming tasks  this date (wash face, wash hands, brush teeth, etc) with improved sequencing, pacing,  following directions, initiation of task/termination of task, etc Other Exercises: Performed functional transfers using RW with MIN A this date Other Exercises: Seated ther-ex with improved ability to participate. LAQ, hip abd/add, and AP performed on B LE x 10 reps with cues    General Comments General comments (skin integrity, edema, etc.): Demonstrating improved cognition      Pertinent Vitals/Pain Pain Assessment: No/denies pain    Home Living                      Prior Function            PT Goals (current goals can now be found in the care plan section) Acute Rehab PT Goals Patient Stated Goal: agreeable to session PT Goal Formulation: With patient Time For Goal Achievement: 02/25/20 Potential to Achieve Goals: Fair Progress towards PT goals: Progressing toward goals    Frequency    Min 2X/week      PT Plan Current plan remains appropriate    Co-evaluation              AM-PAC PT "6 Clicks" Mobility   Outcome Measure  Help needed turning from your back to your side while in a flat bed without using bedrails?: A Little Help needed moving from lying on your back to sitting on the side of a flat bed without using bedrails?: A Little Help needed moving to and from a bed to a chair (including a wheelchair)?: A Little Help needed standing up from a chair using your arms (e.g., wheelchair or bedside chair)?: A Little Help needed to walk in hospital room?: A Little Help needed climbing 3-5 steps with a railing? : A Lot 6 Click Score: 17    End of Session Equipment Utilized During Treatment: Gait belt Activity Tolerance: Patient tolerated treatment well;Patient limited by fatigue Patient left: in chair;with call bell/phone within reach;with chair alarm set Nurse Communication: Mobility status PT Visit Diagnosis: Muscle weakness (generalized) (M62.81);Other abnormalities of gait and mobility (R26.89);Unsteadiness on feet (R26.81);Other symptoms and signs  involving the nervous system (R29.898);Repeated falls (R29.6)     Time: 0102-7253 PT Time Calculation (min) (ACUTE ONLY): 23 min  Charges:  $Gait Training: 8-22 mins $Therapeutic Exercise: 8-22 mins                     Greggory Stallion, PT, DPT [phone]    Ayako Tapanes 02/21/2020, 1:22 PM

## 2020-02-21 NOTE — Progress Notes (Signed)
PROGRESS NOTE    Duane Santos  YNW:[id number] DOB: 08/15/1946 DOA: 02/09/2020 PCP: [place]      Assessment & Plan:   Principal Problem:   Acute confusional state Active Problems:   Multifocal pneumonia   Myasthenia gravis ([place])   Right lower lobe lung mass   Generalized weakness   Hyponatremia   History of COVID-19 pneumonia with hypoxia   Acute metabolic encephalopathy   Chronic respiratory failure with hypoxia ([place])   Palliative care encounter   Acute confusional state: likely secondary to crypto meningitis. Intermittent, oriented to person, month, year only. Echo shows EF 65-70%, interatrial septum was not well visualized. 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 Continue on amphotericin B, flucytosine as per ID. 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 CSF cx growing yeast  Subacute CVA vs metastatic disease: as per MRI. Continue w/ neuro checks. PT/OT initially recommending SNF, now PT recs "CIR as per wife's request"   Multifocal pneumonia: w/ recurrent fevers & probable hospital-acquired, immunocompromised state. No fevers in at least 48 hours. 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 Continue fluid restriction and salt tablets.   Right lung mass: suspicious for malignancy, sclerotic changes of the right third fourth and fifth ribs suspicious for metastatic disease.  S/p bronchoscopy w/ biopsy on 02/18/20, path pending. Follow-up with palliative care. Pulmon following and recs apprec  Recent pulmonary embolism and DVT: diagnosed on December 23, 2019. Restarted back on eliquis.   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 Recommend annual imaging followup by CTA or MRA  Depression: psychiatry consulted as per wife's request. Psych following and recs apprec   Generalized weakness/debility: PT/OT initially recommending SNF, now PT recs "CIR as per wife's request". BMI 19.21    DVT prophylaxis: eliquis  Code Status: DNR Family Communication: discussed pt's care w/ pt's wife, Maudie Mercury, and answered her questions  Disposition Plan:  PT/OT initially recommending SNF, now PT recs "CIR as per wife's request". ID is rec pt stay inpatient for another 5-7 days while on IV amp  Consultants:   Pulmon  Palliative care  Psychiatry   Nephro  Neuro  ID   Procedures:    Antimicrobials: zosyn   Subjective: Pt c/o neck pain.   Objective: Vitals:   02/19/20 2248 02/20/20 0740 02/20/20 1632 02/20/20 2318  BP: (!) 145/85 (!) 145/79 128/83 (!) 143/80  Pulse: (!) 108 (!) 109 (!) 110 (!) 113  Resp:  16  18  Temp: 98.5 F (36.9 C) 98.2 F (36.8 C) 98.2 F (36.8 C) (!) 97.4 F (36.3 C)  TempSrc: Oral  Oral Oral  SpO2: 100% 92% 97% 100%  Weight:      Height:        Intake/Output Summary (Last 24 hours) at 02/21/2020 0723 Last data filed at 02/21/2020 0532 Gross per 24 hour  Intake 353.89 ml  Output 1995 ml  Net -1641.11 ml   Filed Weights   02/09/20 1656  Weight: 54 kg    Examination:  General exam: Appears calm and comfortable  Respiratory system: decreased breath sounds b/l. No wheezes Cardiovascular system: S1 & S2. No rubs, gallops or clicks.  Gastrointestinal system: Abdomen is nondistended, soft and  nontender. normal bowel sounds heard.  Central nervous system: Alert and awake. Moves all 4 extremities  Psychiatry: Judgement and insight appear abnormal. Flat mood and affect    Data Reviewed: I have personally reviewed following labs and imaging studies  CBC: Recent Labs  Lab 02/14/20 0818 02/14/20 0818 02/17/20 0353 02/18/20 0603 02/19/20 0538 02/20/20 0522  02/21/20 0615  WBC 11.1*   < > 11.7* 13.8* 10.6* 9.4 8.8  NEUTROABS 9.8*  --   --   --   --   --   --   HGB 12.3*   < > 12.8* 12.6* 11.9* 12.8* 12.5*  HCT 36.5*   < > 38.6* 40.5 37.6* 38.4* 37.8*  MCV 98.4   < > 100.3* 105.2* 102.7* 100.0 99.7  PLT 273   < > 297 263 214 232 242   < > = values in this interval not displayed.   Basic Metabolic Panel: Recent Labs  Lab 02/17/20 0353 02/18/20 0603 02/19/20 0538 02/20/20 0522 02/21/20 0615  NA 145 145 142 141 143  K 4.1 3.9 3.6 3.5 3.7  CL 105 106 105 104 105  CO2 _0 GLUCOSE 120* 101* 102* 106* 119*  BUN 26* 27* 18 20 29*  CREATININE 0.79 0.76 0.67 0.66 0.84  CALCIUM 9.7 9.5 8.7* 9.4 9.6  MG  --  2.6* 2.4 2.3 2.7*   GFR: Estimated Creatinine Clearance: 60.7 mL/min (by C-G formula based on SCr of 0.84 mg/dL). Liver Function Tests: No results for input(s): AST, ALT, ALKPHOS, BILITOT, PROT, ALBUMIN in the last 168 hours. No results for input(s): LIPASE, AMYLASE in the last 168 hours. No results for input(s): AMMONIA in the last 168 hours. Coagulation Profile: Recent Labs  Lab 02/17/20 1512  INR 0.9   Cardiac Enzymes: No results for input(s): CKTOTAL, CKMB, CKMBINDEX, TROPONINI in the last 168 hours. BNP (last 3 results) No results for input(s): PROBNP in the last 8760 hours. HbA1C: No results for input(s): HGBA1C in the last 72 hours. CBG: Recent Labs  Lab 02/18/20 1556  GLUCAP 102*   Lipid Profile: No results for input(s): CHOL, HDL, LDLCALC, TRIG, CHOLHDL, LDLDIRECT in the last 72 hours. Thyroid Function Tests: No results for input(s): TSH, T4TOTAL, FREET4, T3FREE, THYROIDAB in the last 72 hours. Anemia Panel: No results for input(s): VITAMINB12, FOLATE, FERRITIN, TIBC, IRON, RETICCTPCT in the last 72 hours. Sepsis Labs: No results for input(s): PROCALCITON, LATICACIDVEN in the last 168 hours.  Recent Results (from the past 240 hour(s))  CULTURE, BLOOD (ROUTINE X 2) w Reflex to ID Panel     Status:  None   Collection Time: 02/12/20  3:59 PM   Specimen: BLOOD  Result Value Ref Range Status   Specimen Description BLOOD RAC  Final   Special Requests   Final    BOTTLES DRAWN AEROBIC AND ANAEROBIC Blood Culture results may not be optimal due to an excessive volume of blood received in culture bottles   Culture   Final    NO GROWTH 5 DAYS Performed at [facility], [address] [place], [place] [address]    Report Status 02/17/2020 FINAL  Final  CULTURE, BLOOD (ROUTINE X 2) w Reflex to ID Panel     Status: None   Collection Time: 02/12/20  4:07 PM   Specimen: BLOOD RIGHT HAND  Result Value Ref Range Status   Specimen Description BLOOD RIGHT HAND [facility]  Final   Special Requests   Final    BOTTLES  DRAWN AEROBIC AND ANAEROBIC Blood Culture adequate volume   Culture   Final    NO GROWTH 5 DAYS Performed at [facility], [address] [place], [place] [address]    Report Status 02/17/2020 FINAL  Final  MRSA PCR Screening     Status: None   Collection Time: 02/13/20  3:03 PM   Specimen: Nasal Mucosa; Nasopharyngeal  Result Value Ref Range Status   MRSA by PCR NEGATIVE NEGATIVE Final    Comment:        The GeneXpert MRSA Assay (FDA approved for NASAL specimens only), is one component of a comprehensive MRSA colonization surveillance program. It is not intended to diagnose MRSA infection nor to guide or monitor treatment for MRSA infections. Performed at [facility], [address] [place], [place] [address]   Aspergillus Ag, BAL/Serum     Status: None   Collection Time: 02/16/20  6:17 AM   Specimen: Lung, Right  Result Value Ref Range Status   Aspergillus Ag, BAL/Serum 0.03 0.00 - 0.49 Index Final    Comment: (NOTE) Performed At: [facility] [address] [place], [place] [id number] Rush Farmer MD [id number] Performed At: [facility] [address] [place], [place] [id number] Katina Degree MDPhD [id number]   Culture,  bal-quantitative     Status: None   Collection Time: 02/18/20 10:00 AM   Specimen: Bronchoalveolar Lavage  Result Value Ref Range Status   Specimen Description   Final    BRONCHIAL ALVEOLAR LAVAGE Performed at [facility], [address], [place], [place] [address]    Special Requests   Final    NONE Performed at [facility], [address] [place], [place] [address]    Gram Stain NO WBC SEEN NO ORGANISMS SEEN   Final   Culture   Final    NO GROWTH 2 DAYS Performed at [facility], [place] [address] [place], [place] [address]    Report Status 02/20/2020 FINAL  Final  Culture, fungus without smear     Status: None (Preliminary result)   Collection Time: 02/18/20 10:02 AM   Specimen: Lung; Cerebrospinal Fluid  Result Value Ref Range Status   Specimen Description   Final    LUNG Performed at [facility], [address] [place], [place] [address]    Special Requests   Final    LLL Performed at [facility], [address], [place], [place] [address]    Culture   Final    NO FUNGUS ISOLATED AFTER 2 DAYS Performed at [facility], [place] [address], [place], [place] [address]    Report Status PENDING  Incomplete  CSF culture     Status: Abnormal (Preliminary result)   Collection Time: 02/18/20  2:51 PM   Specimen: CSF; Cerebrospinal Fluid  Result Value Ref Range Status   Specimen Description CSF  Final   Special Requests NONE  Final   Gram Stain (A)  Final    YEAST CRITICAL RESULT CALLED TO, READ BACK BY AND VERIFIED WITH: SERENITY KIRKENDALL AT 1600 02/18/20.PMF WBC SEEN RED BLOOD CELLS PRESENT Performed at [facility], [address] [place], [place] [address]    Culture PENDING  Incomplete   Report Status PENDING  Incomplete         Radiology Studies: No results found.      Scheduled Meds: . apixaban  5 mg Oral BID  . butamben-tetracaine-benzocaine  1 spray Topical Once  .  Chlorhexidine  Gluconate Cloth  6 each Topical Daily  . dextrose  10 mL Intravenous Q24H  . dextrose  10 mL Intravenous Q24H  . feeding supplement (ENSURE ENLIVE)  237 mL Oral TID BM  . flucytosine  25 mg/kg Oral Q6H  . lidocaine (PF)  30 mL Other Once  . lidocaine  1 application Topical Once  . multivitamin with minerals  1 tablet Oral Daily  . predniSONE  15 mg Oral Q breakfast  . sodium chloride  500 mL Intravenous Q24H  . sodium chloride  500 mL Intravenous Q24H  . sodium chloride flush  10-40 mL Intracatheter Q12H  . sodium chloride  1 g Oral TID WC  . thiamine  100 mg Oral Daily   Continuous Infusions: . amphotericin  B  Liposome (AMBISOME) ADULT IV 200 mg (02/20/20 2254)  . piperacillin-tazobactam (ZOSYN)  IV 3.375 g (02/21/20 0128)     LOS: 10 days    Time spent: 30 mins     Wyvonnia Dusky, MD [facility] Pager [phone]  If 7PM-7AM, please contact night-coverage [email or web address] 02/21/2020, 7:23 AM

## 2020-02-21 NOTE — TOC Progression Note (Signed)
Transition of Care [facility]) - Progression Note    Patient Details  Name: Duane Santos MRN: [id number] Date of Birth: [birth year]  [facility] CM/SW Contact  Hadeel Hillebrand, Gardiner Rhyme, LCSW Phone Number: 02/21/2020, 12:00 PM  Clinical Narrative:   Met with wife to discuss [facility] CIR have submitted his information to the insurance and is awaiting answer for approval to CIR. Wife is aware if denied she has the right to appeal this decision. She is planning if CIR is denied will probably take him home. She does not want him in a SNF. She voiced he is being treated for infection in his lung and path report pending but may not be lung cancer after all. Continue to work on discharge plan.    Expected Discharge Plan: [place] Barriers to Discharge: Continued Medical Work up  Expected Discharge Plan and Services Expected Discharge Plan: [place] In-house Referral: Clinical Social Work   Post Acute Care Choice: Home Health, Durable Medical Equipment Living arrangements for the past 2 months: Single Family Home Expected Discharge Date: 02/15/20                                     Social Determinants of Health (SDOH) Interventions    Readmission Risk Interventions Readmission Risk Prevention Plan 12/30/2019  Transportation Screening Complete  Home Care Screening Complete  Medication Review (RN CM) Complete  Some recent data might be hidden

## 2020-02-21 NOTE — Progress Notes (Signed)
This morning when patient's wife arrived she called me to the room and said he had a headache and could he get some tylenol. I said absolutely and went to get it. When I came back, wife approaches me and says, "He told me that he asked you if he could get up to the commode this morning and you said 'no'". I informed the wife that conversation never happened with me and that we got him up to the chair at 0800 this morning without incident. Patient was not super talkative as usual early in the morning. Wife then added, "Well, he has a headache and I doubt that anyone has asked him if he's been in pain." I replied to her, "I actually did ask him if he was in any pain or if he had a headache first thing this morning and his reply was, 'no'". Wife then turned around and made some notes in her notebook. I asked the wife if there was anything else she needed after I gave him his Tylenol and she said no. Told her to please call if she needed anything and that I would be back in an hour.

## 2020-02-21 NOTE — Progress Notes (Signed)
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 normal. The right ventricular size is normal. Tricuspid regurgitation signal is inadequate for assessing PA pressure.  3. The mitral valve is grossly normal. No evidence of mitral valve regurgitation.  4. The aortic valve was not well visualized. Aortic valve regurgitation not well-assessed. Aortic stenosis was not well-assessed. FINDINGS  Left Ventricle: Left ventricular ejection fraction, by estimation, is 65 to 70%. The left ventricle has normal function. Left ventricular endocardial border not optimally defined to evaluate regional wall motion. The left ventricular internal cavity size was normal in size. There is no left ventricular hypertrophy. Left ventricular diastolic function could not be evaluated. Right Ventricle: The right ventricular size is normal. No increase in right ventricular wall thickness. Right ventricular systolic function is normal. Tricuspid regurgitation signal is inadequate for assessing PA pressure. Left Atrium: Left atrial size was not well visualized. Right Atrium: Right atrial  size was not well visualized. Pericardium: The pericardium was not well visualized. Mitral Valve: The mitral valve is grossly normal. No evidence of mitral valve regurgitation. Tricuspid Valve: The tricuspid valve is grossly normal. Tricuspid valve regurgitation is trivial. Aortic Valve: The aortic valve was not well visualized. Aortic valve regurgitation not well-assessed. Aortic stenosis was not well-assessed. Pulmonic Valve: The pulmonic valve was not well visualized. Pulmonic valve regurgitation is not visualized. No evidence of pulmonic stenosis. Aorta: The aortic root is normal in size and structure. Pulmonary Artery: The  pulmonary artery is not well seen. Venous: The inferior vena cava was not well visualized. IAS/Shunts: The interatrial septum was not well visualized. Agitated saline contrast was given intravenously to evaluate for intracardiac shunting.  LEFT VENTRICLE PLAX 2D LVIDd:         3.33 cm LVIDs:         2.22 cm LV PW:         0.95 cm LV IVS:        0.83 cm LVOT diam:     2.00 cm LVOT Area:     3.14 cm  LEFT ATRIUM         Index LA diam:    1.80 cm 1.12 cm/m                        PULMONIC VALVE AORTA                 RVOT Peak grad: 3 mmHg Ao Root diam: 3.10 cm   SHUNTS Systemic Diam: 2.00 cm Nelva Bush MD Electronically signed by Nelva Bush MD Signature Date/Time: 02/11/2020/4:44:22 PM    Final    [place] EKG SITE RITE  Result Date: 02/18/2020 If Site Rite image not attached, placement could not be confirmed due to current cardiac rhythm.  DG FL GUIDED LUMBAR PUNCTURE  Result Date: 02/18/2020 CLINICAL DATA:  Confusion, cryptococcal antigen EXAM: DIAGNOSTIC LUMBAR PUNCTURE UNDER FLUOROSCOPIC GUIDANCE FLUOROSCOPY TIME:  Fluoroscopy Time:  00:30 Number of Acquired Spot Images: 6 PROCEDURE: Informed consent was obtained from the patient prior to the procedure, including potential complications of headache, allergy, and pain. With the patient prone, the lower back was prepped with  Betadine. 1% Lidocaine was used for local anesthesia. Lumbar puncture was performed at the L3-L4 level using a 22 gauge needle with return of clear CSF with an opening pressure of less than 9 cm water. 8 ml of CSF were obtained for laboratory studies. The patient tolerated the procedure well and there were no apparent complications. IMPRESSION: Successful fluoroscopic lumbar puncture. 8 mL clear CSF obtained and submitted to the laboratory. Electronically Signed   By: Eddie Candle M.D.   On: 02/18/2020 14:57   CT Super D Chest W Contrast  Result Date: 02/17/2020 CLINICAL DATA:  74 year old male with altered mental status, history of multifocal pneumonia or in the setting of myasthenia gravis. EXAM: CT CHEST WITH CONTRAST TECHNIQUE: Multidetector CT imaging of the chest was performed using thin slice collimation for electromagnetic bronchoscopy planning purposes, with intravenous contrast. CONTRAST:  48mL OMNIPAQUE IOHEXOL 300 MG/ML  SOLN COMPARISON:  Prior PET-CT of 01/24/2020 FINDINGS: Cardiovascular: Ascending dilated to 3.5 cm. Scattered atherosclerosis summer is calcification. Heart size is normal without pericardial effusion. Scattered coronary artery calcification. Central pulmonary vasculature is normal. Mediastinum/Nodes: Small lymph nodes throughout the mediastinum unchanged since recent imaging studies largest 7 mm along the right paratracheal chain. Other smaller nodes track towards the thoracic inlet. No thoracic inlet adenopathy. No axillary lymphadenopathy. No nodal enlargement beyond a cm in the bilateral hila. Correlate with recent PET exam which shows right hilar FDG uptake. Lungs/Pleura: Signs of ground-glass, diminished in general since the previous exam now with more peripheral predominance. Right lower lobe nodule found to be FDG avid in the medial right lung base (image 242, series 5) 1.6 by 0.7 cm not changed accounting for the degree of respiratory motion and was present on the CT  angiography there was performed January of 2021 though not well seen  on this study due to the motion artifact. Spiculated nodule in the superior segment of the right lower lobe with some central area of added density suggesting some calcification. This measures 2.1 by 2.0 cm, showing a similar appearance. Development of some bronchiectatic changes since the prior study in the left upper lobe, lingula and left lower lobe. Septal thickening is more pronounced also with dilation of left upper lobe bronchi. Process shows a more apical and peripheral than basilar predominance and is occurred in the short interval. No consolidation. No pleural effusion. Upper Abdomen: Incidental imaging of upper abdominal contents is unremarkable. Musculoskeletal: Unchanged appearance of sclerotic foci and right-sided ribs. Signs of prior fracture to left-sided ribs midthoracic compression fractures without interval change. IMPRESSION: 1. Signs of bronchiectatic changes and septal thickening more so than the ground-glass that was seen on the previous exam. Bronchiectasis was present previously and may be slightly worse on today's exam though the lung parenchyma is better evaluated on today's study. Post inflammatory fibrosis is considered, by report in the medical record the patient has a history of COVID infection. 2. Spiculated nodule superior segment right lower lobe with central area of added density suggesting some calcification, wall finding remains concerning for bronchogenic neoplasm the possibility of sequela of granulomatous infection could also be considered. Biopsy is suggested. 3. Correlate with recent PET exam which shows right hilar FDG uptake. 4. Stable appearance of sclerotic foci in the right-sided ribs. 5. 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 Recommend annual imaging followup by CTA or MRA. This recommendation follows 2010 ACCF/AHA/AATS/ACR/ASA/SCA/SCAI/SIR/STS/SVM Guidelines for the Diagnosis and Management of  Patients with Thoracic Aortic Disease. Circulation.2010; 121: L244-W102. Aortic aneurysm NOS (ICD10-I71.9) 6. Pulmonary embolism seen on prior exam is not demonstrated on the current exam. 7. Scattered coronary artery calcification. Electronically Signed   By: Zetta Bills M.D.   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 -he will need to be off Eliquis for 7d prior to procedure -he wants to be DNR -Palliative evaluation is inprogress    Disseminated cryptococcal meningoencephalitis  -Infectious disease on case-appreciate input  -Crypto antigen elevated - LP results noted  - currently on AmphoB  - decreasing steroids as per neuro  COVID19 infection - patient is no longer hypoxemic and is able to have saturations over 90% while at rest without using supplemental oxygen -we will order O2 concentrator   Severe hyponatremia   - repleted    - likely due to overwhelming infection  Altered mental status  - likely due to hyponatremia and septic encephalopathy  -continue current plan   Myesthenia Gravis   -decreasing prednisone  - s/p neuro evaluation - possible mestinon therapy soon -monitor for MG exacerbation post steroid taper    Pulmonary embolism - patient on eliquis- will hold for now. He is on Lovenox now, we will stop this on Wednesday 02/16/20 -Have stopped Eliquis prior to hospitalization in preparation for ENB/EBUS      Thank you for allowing me to participate in the care of this patient.   Patient/Family are satisfied with care plan and all questions have been answered.  This document was prepared using Dragon voice recognition software and may include unintentional dictation errors.     Ottie Glazier, M.D.  Division of  Pulmonary & Critical Care  [place]

## 2020-02-21 NOTE — Progress Notes (Signed)
OT Cancellation Note  Patient Details Name: Duane Santos MRN: [id number] DOB: [birth year]   Cancelled Treatment:    Reason Eval/Treat Not Completed: Other (comment)   Approached pt for OT session this AM.  Pt still eating breakfast; will follow up as able and appropriate.  Thank you.  Oren Binet 02/21/2020, 10:32 AM

## 2020-02-21 NOTE — Progress Notes (Signed)
Occupational Therapy Treatment Patient Details Name: Duane Santos MRN: [id number] DOB: [birth year] Today's Date: 02/21/2020    History of present illness Pt. presented to ER secondary to AMS, progressive weakness and repeated falls; admitted for management of acute metabolic encephalopathy.  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 PMH significant for MG, covid-19 infection (1/21) and newly-diagnosed R lung cancer.  s/p lung biopsy, pending LP for continued work up.   OT comments  Patient in recliner this AM and agreeable to therapy.  Patient with complaints of pain as catheter was removed recently.  BP at 138/87 and HR at 113 at rest.  Patient performed grooming tasks while seated with improved sequencing, task initiation/task termination, self pacing and safety.  Minimal cues required.  Performed sit to stand transfers from recliner with emphasis on body mechanics and sequencing.  HR up to 142 after activity.  No complaints of dizziness.  Continues to require MIN A for body mechanics and safety.  Patient is demonstrating improved carryover with education and ability to conversate effectively.  Wife wishes patient to d/c to CIR at this time.  Patient will benefit from 24/7 SPV.     Follow Up Recommendations  CIR    Equipment Recommendations  Other (comment)(Defer to next level of care)    Recommendations for Other Services      Precautions / Restrictions Precautions Precautions: Fall Restrictions Weight Bearing Restrictions: No       Mobility Bed Mobility               General bed mobility comments: Not tested this date  Transfers Overall transfer level: Needs assistance Equipment used: Rolling walker (2 wheeled) Transfers: Sit to/from Stand Sit to Stand: Min assist         General transfer comment: MIN A needed for appropriate body mechancs/hand placement/safety    Balance                                            ADL either performed or assessed with clinical judgement   ADL Overall ADL's : Needs assistance/impaired Eating/Feeding: Supervision/ safety;Sitting Eating/Feeding Details (indicate cue type and reason): Recommending SPV for safety during self feeding and appropriate follow through of task. Grooming: Set up;Supervision/safety;Sitting;Wash/dry hands;Wash/dry face;Oral care Grooming Details (indicate cue type and reason): Improved ability to initiate and sequence through tasks.                               General ADL Comments: Demonstrating improved cognition and ability to conversate throughout tasks.     Vision Patient Visual Report: No change from baseline     Perception     Praxis      Cognition Arousal/Alertness: Awake/alert Behavior During Therapy: WFL for tasks assessed/performed Overall Cognitive Status: Impaired/Different from baseline Area of Impairment: Orientation;Attention;Memory;Following commands                 Orientation Level: Person Current Attention Level: Alternating Memory: Decreased short-term memory;Decreased recall of precautions Following Commands: Follows one step commands consistently Safety/Judgement: Decreased awareness of safety;Decreased awareness of deficits   Problem Solving: Slow processing;Decreased initiation;Difficulty sequencing;Requires verbal cues;Requires tactile cues General Comments: Continues to require cues for optimal carryover of education, however, demonstrating improved ability to initiate, sequencing and end task  Exercises Other Exercises Other Exercises: Completed grooming tasks this date (wash face, wash hands, brush teeth, etc) with improved sequencing, pacing, following directions, initiation of task/termination of task, etc Other Exercises: Performed functional transfers using RW with MIN A this date   Shoulder Instructions       General Comments Demonstrating improved  cognition    Pertinent Vitals/ Pain       Pain Assessment: No/denies pain  Home Living                                          Prior Functioning/Environment              Frequency  Min 2X/week        Progress Toward Goals  OT Goals(current goals can now be found in the care plan section)  Progress towards OT goals: Progressing toward goals     Plan Discharge plan remains appropriate;Frequency remains appropriate    Co-evaluation                 AM-PAC OT "6 Clicks" Daily Activity     Outcome Measure   Help from another person eating meals?: A Little Help from another person taking care of personal grooming?: A Little Help from another person toileting, which includes using toliet, bedpan, or urinal?: A Lot Help from another person bathing (including washing, rinsing, drying)?: A Lot Help from another person to put on and taking off regular upper body clothing?: A Little Help from another person to put on and taking off regular lower body clothing?: A Lot 6 Click Score: 15    End of Session Equipment Utilized During Treatment: Rolling walker  OT Visit Diagnosis: Muscle weakness (generalized) (M62.81);Unsteadiness on feet (R26.81)   Activity Tolerance Patient tolerated treatment well   Patient Left in chair;with call bell/phone within reach;with chair alarm set   Nurse Communication          Time: 0955-1010 OT Time Calculation (min): 15 min  Charges: OT General Charges $OT Visit: 1 Visit OT Treatments $Self Care/Home Management : 8-22 mins  Baldomero Lamy, MS, OTR/L 02/21/20, 10:45 AM

## 2020-02-22 LAB — CBC
HCT: 37.4 % — ABNORMAL LOW (ref 39.0–52.0)
Hemoglobin: 12.4 g/dL — ABNORMAL LOW (ref 13.0–17.0)
MCH: 33.5 pg (ref 26.0–34.0)
MCHC: 33.2 g/dL (ref 30.0–36.0)
MCV: 101.1 fL — ABNORMAL HIGH (ref 80.0–100.0)
Platelets: 236 10*3/uL (ref 150–400)
RBC: 3.7 MIL/uL — ABNORMAL LOW (ref 4.22–5.81)
RDW: 15.7 % — ABNORMAL HIGH (ref 11.5–15.5)
WBC: 7.4 10*3/uL (ref 4.0–10.5)
nRBC: 0 % (ref 0.0–0.2)

## 2020-02-22 LAB — BASIC METABOLIC PANEL
Anion gap: 6 (ref 5–15)
BUN: 30 mg/dL — ABNORMAL HIGH (ref 8–23)
CO2: 30 mmol/L (ref 22–32)
Calcium: 9.9 mg/dL (ref 8.9–10.3)
Chloride: 107 mmol/L (ref 98–111)
Creatinine, Ser: 0.8 mg/dL (ref 0.61–1.24)
GFR calc Af Amer: >60 mL/min (ref >60)
GFR calc non Af Amer: >60 mL/min (ref >60)
Glucose, Bld: 99 mg/dL (ref 70–99)
Potassium: 4 mmol/L (ref 3.5–5.1)
Sodium: 143 mmol/L (ref 135–145)

## 2020-02-22 LAB — MAGNESIUM: Magnesium: 2.6 mg/dL — ABNORMAL HIGH (ref 1.7–2.4)

## 2020-02-22 NOTE — Progress Notes (Signed)
Occupational Therapy Treatment Patient Details Name: Duane Santos MRN: [id number] DOB: [birth year] Today's Date: 02/22/2020    History of present illness Pt. presented to ER secondary to AMS, progressive weakness and repeated falls; admitted for management of acute metabolic encephalopathy.  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 PMH significant for MG, covid-19 infection (1/21) and newly-diagnosed R lung cancer.  s/p lung biopsy, pending LP for continued work up.   OT comments  Patient approached in AM and agreeable to therapy.  No complaints of pain or dizziness throughout session.  BP noted at 122/88, 116HR at rest.  Patient unable to verbalize if feeling SOB or if heart is "racing".  Patient states he feels normal.  Patient demonstrated improved participation in hygiene and grooming tasks this date with good initiation of familiar tasks after set up.  Continues to require SPV and VCs for some sequencing aspects and termination of tasks.  Demonstrated good activity tolerance through tasks as several staff members exited and entered during session.  Demonstrates improved appropriate conversation through session.  Patient's wife continues to state she will take him to CIR upon d/c.  Patient would benefit from continued skilled occupational therapy to address activity tolerance, safety awareness, orientation, cognition, ADL retraining, functional transfers, and functional mobility.    Follow Up Recommendations  CIR(Wife will not take patient to SNF.)    Equipment Recommendations  Other (comment)(defer to next level of care)    Recommendations for Other Services      Precautions / Restrictions Precautions Precautions: Fall Restrictions Weight Bearing Restrictions: No       Mobility Bed Mobility               General bed mobility comments: not performed as pt received in recliner  Transfers                      Balance                                            ADL either performed or assessed with clinical judgement   ADL Overall ADL's : Needs assistance/impaired Eating/Feeding: Supervision/ safety;Sitting Eating/Feeding Details (indicate cue type and reason): Recommending SPV for safety during self feeding and appropriate follow through of task. Grooming: Set up;Supervision/safety;Sitting;Wash/dry hands;Wash/dry face;Oral care Grooming Details (indicate cue type and reason): Improved ability to initiate and sequence through tasks.                               General ADL Comments: Demonstrating improved cognition and ability to conversate throughout tasks.  Good task initiation with familiar tasks at this time.     Vision Patient Visual Report: No change from baseline     Perception     Praxis      Cognition Arousal/Alertness: Awake/alert Behavior During Therapy: WFL for tasks assessed/performed Overall Cognitive Status: Impaired/Different from baseline                       Memory: Decreased short-term memory Following Commands: Follows one step commands consistently       General Comments: Patient able to recognize this author, but has limited carryover of previous days/sessions.        Exercises Other Exercises Other Exercises: Completed grooming tasks this  date (wash face, wash hands, brush teeth, etc) with improved sequencing, pacing, following directions, initiation of task/termination of task, etc   Shoulder Instructions       General Comments      Pertinent Vitals/ Pain       Pain Assessment: No/denies pain(Patient verbalizes no pain or dizziness during activity.)  Home Living                                          Prior Functioning/Environment              Frequency  Min 2X/week        Progress Toward Goals  OT Goals(current goals can now be found in the care plan section)  Progress towards OT  goals: Progressing toward goals     Plan Discharge plan remains appropriate;Frequency remains appropriate    Co-evaluation                 AM-PAC OT "6 Clicks" Daily Activity     Outcome Measure                    End of Session    OT Visit Diagnosis: Muscle weakness (generalized) (M62.81);Unsteadiness on feet (R26.81)   Activity Tolerance Patient tolerated treatment well   Patient Left in chair;with call bell/phone within reach;with chair alarm set;with nursing/sitter in room   Nurse Communication          Time: 9021-1155 OT Time Calculation (min): 18 min  Charges: OT General Charges $OT Visit: 1 Visit OT Treatments $Self Care/Home Management : 8-22 mins  Baldomero Lamy, MS, OTR/L 02/22/20, 10:16 AM

## 2020-02-22 NOTE — Progress Notes (Signed)
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 No evidence of mitral valve regurgitation.  4. The aortic valve was not well visualized. Aortic valve regurgitation not well-assessed. Aortic stenosis was not well-assessed. FINDINGS  Left Ventricle: Left ventricular ejection fraction, by estimation, is 65 to 70%. The left ventricle has normal function. Left ventricular endocardial border not optimally defined to evaluate regional wall motion. The left ventricular internal cavity size was normal in size. There is no left ventricular hypertrophy. Left ventricular diastolic function could not be evaluated. Right Ventricle: The right ventricular size is normal. No increase in right ventricular wall thickness. Right ventricular systolic function is normal. Tricuspid regurgitation signal is inadequate for assessing PA pressure. Left Atrium: Left atrial size was not well visualized. Right Atrium: Right atrial size was not well visualized. Pericardium: The pericardium was not well visualized. Mitral Valve: The mitral valve is grossly normal. No evidence of mitral valve  regurgitation. Tricuspid Valve: The tricuspid valve is grossly normal. Tricuspid valve regurgitation is trivial. Aortic Valve: The aortic valve was not well visualized. Aortic valve regurgitation not well-assessed. Aortic stenosis was not well-assessed. Pulmonic Valve: The pulmonic valve was not well visualized. Pulmonic valve regurgitation is not visualized. No evidence of pulmonic stenosis. Aorta: The aortic root is normal in size and structure. Pulmonary Artery: The pulmonary artery is not well seen. Venous: The inferior vena cava was not well visualized. IAS/Shunts: The interatrial septum was not well visualized. Agitated saline contrast was given intravenously  to evaluate for intracardiac shunting.  LEFT VENTRICLE PLAX 2D LVIDd:         3.33 cm LVIDs:         2.22 cm LV PW:         0.95 cm LV IVS:        0.83 cm LVOT diam:     2.00 cm LVOT Area:     3.14 cm  LEFT ATRIUM         Index LA diam:    1.80 cm 1.12 cm/m                        PULMONIC VALVE AORTA                 RVOT Peak grad: 3 mmHg Ao Root diam: 3.10 cm   SHUNTS Systemic Diam: 2.00 cm Nelva Bush MD Electronically signed by Nelva Bush MD Signature Date/Time: 02/11/2020/4:44:22 PM    Final    [place] EKG SITE RITE  Result Date: 02/18/2020 If Site Rite image not attached, placement could not be confirmed due to current cardiac rhythm.  DG FL GUIDED LUMBAR PUNCTURE  Result Date: 02/18/2020 CLINICAL DATA:  Confusion, cryptococcal antigen EXAM: DIAGNOSTIC LUMBAR PUNCTURE UNDER FLUOROSCOPIC GUIDANCE FLUOROSCOPY TIME:  Fluoroscopy Time:  00:30 Number of Acquired Spot Images: 6 PROCEDURE: Informed consent was obtained from the patient prior to the procedure, including potential complications of headache, allergy, and pain. With the patient prone, the lower back was prepped with Betadine. 1% Lidocaine was used for local anesthesia. Lumbar puncture was performed at the L3-L4 level using a 22 gauge needle with return of clear CSF with an opening  pressure of less than 9 cm water. 8 ml of CSF were obtained for laboratory studies. The patient tolerated the procedure well and there were no apparent complications. IMPRESSION: Successful fluoroscopic lumbar puncture. 8 mL clear CSF obtained and submitted to the laboratory. Electronically Signed   By: Eddie Candle M.D.   On: 02/18/2020 14:57   CT Super D Chest W Contrast  Result Date: 02/17/2020 CLINICAL DATA:  74 year old male with altered mental status, history of multifocal pneumonia or in the setting of myasthenia gravis. EXAM: CT CHEST WITH CONTRAST TECHNIQUE: Multidetector CT imaging of the chest was performed using thin slice collimation for electromagnetic bronchoscopy planning purposes, with intravenous contrast. CONTRAST:  37mL OMNIPAQUE IOHEXOL 300 MG/ML  SOLN COMPARISON:  Prior PET-CT of 01/24/2020 FINDINGS: Cardiovascular: Ascending dilated to 3.5 cm. Scattered atherosclerosis summer is calcification. Heart size is normal without pericardial effusion. Scattered coronary artery calcification. Central pulmonary vasculature is normal. Mediastinum/Nodes: Small lymph nodes throughout the mediastinum unchanged since recent imaging studies largest 7 mm along the right paratracheal chain. Other smaller nodes track towards the thoracic inlet. No thoracic inlet adenopathy. No axillary lymphadenopathy. No nodal enlargement beyond a cm in the bilateral hila. Correlate with recent PET exam which shows right hilar FDG uptake. Lungs/Pleura: Signs of ground-glass, diminished in general since the previous exam now with more peripheral predominance. Right lower lobe nodule found to be FDG avid in the medial right lung base (image 242, series 5) 1.6 by 0.7 cm not changed accounting for the degree of respiratory motion and was present on the CT angiography there was performed January of 2021 though not well seen on this study due to the motion artifact. Spiculated nodule in the superior segment of the right lower  lobe with some central area of added density suggesting some calcification.  This measures 2.1 by 2.0 cm, showing a similar appearance. Development of some bronchiectatic changes since the prior study in the left upper lobe, lingula and left lower lobe. Septal thickening is more pronounced also with dilation of left upper lobe bronchi. Process shows a more apical and peripheral than basilar predominance and is occurred in the short interval. No consolidation. No pleural effusion. Upper Abdomen: Incidental imaging of upper abdominal contents is unremarkable. Musculoskeletal: Unchanged appearance of sclerotic foci and right-sided ribs. Signs of prior fracture to left-sided ribs midthoracic compression fractures without interval change. IMPRESSION: 1. Signs of bronchiectatic changes and septal thickening more so than the ground-glass that was seen on the previous exam. Bronchiectasis was present previously and may be slightly worse on today's exam though the lung parenchyma is better evaluated on today's study. Post inflammatory fibrosis is considered, by report in the medical record the patient has a history of COVID infection. 2. Spiculated nodule superior segment right lower lobe with central area of added density suggesting some calcification, wall finding remains concerning for bronchogenic neoplasm the possibility of sequela of granulomatous infection could also be considered. Biopsy is suggested. 3. Correlate with recent PET exam which shows right hilar FDG uptake. 4. Stable appearance of sclerotic foci in the right-sided ribs. 5. 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 Recommend annual imaging followup by CTA or MRA. This recommendation follows 2010 ACCF/AHA/AATS/ACR/ASA/SCA/SCAI/SIR/STS/SVM Guidelines for the Diagnosis and Management of Patients with Thoracic Aortic Disease. Circulation.2010; 121: O962-X528. Aortic aneurysm NOS (ICD10-I71.9) 6. Pulmonary embolism seen on prior exam is not demonstrated on the  current exam. 7. Scattered coronary artery calcification. Electronically Signed   By: Zetta Bills M.D.   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 -he will need to be off Eliquis for 7d prior to procedure -he wants to be DNR -Palliative evaluation is inprogress    Disseminated cryptococcal meningoencephalitis  -Infectious disease on case-appreciate input  -Crypto antigen elevated - LP results noted  - currently on AmphoB  - decreasing steroids as per neuro  COVID19 infection - patient is no longer hypoxemic and is able to have saturations over 90% while at rest without using supplemental oxygen -we will order O2 concentrator   Severe hyponatremia   - repleted    - likely due to overwhelming infection  Altered mental status  - likely due to hyponatremia and septic encephalopathy  -continue current plan   Myesthenia Gravis   -decreasing prednisone  - s/p neuro evaluation - possible mestinon therapy soon -monitor for MG exacerbation post steroid taper    Pulmonary embolism - patient on eliquis- will hold for now. He is on Lovenox now, we will stop this on Wednesday 02/16/20 -Have stopped Eliquis prior to hospitalization in preparation for ENB/EBUS      Thank you for allowing me to participate in the care of this patient.   Patient/Family are satisfied with care plan and all questions have been answered.  This document was prepared using Dragon voice recognition software and may include unintentional dictation errors.     Ottie Glazier, M.D.  Division of [place]

## 2020-02-22 NOTE — Progress Notes (Signed)
[facility] INFECTIOUS DISEASE PROGRESS NOTE Date of Admission:  02/09/2020     ID: EUAN WANDLER is a 74 y.o. male with AMS Principal Problem:   Acute confusional state Active Problems:   Multifocal pneumonia   Myasthenia gravis ([place])   Right lower lobe lung mass   Generalized weakness   Hyponatremia   History of COVID-19 pneumonia with hypoxia   Acute metabolic encephalopathy   Chronic respiratory failure with hypoxia ([place])   Palliative care encounter   Subjective: Remains confused and sleepy, friend at bedside ROS  Unable to obtain  Medications:  Antibiotics Given (last 72 hours)    Date/Time Action Medication Dose Rate   02/20/20 0347 New Bag/Given   piperacillin-tazobactam (ZOSYN) IVPB 3.375 g 3.375 g 12.5 mL/hr   02/20/20 0952 New Bag/Given   piperacillin-tazobactam (ZOSYN) IVPB 3.375 g 3.375 g 12.5 mL/hr   02/20/20 1750 New Bag/Given   piperacillin-tazobactam (ZOSYN) IVPB 3.375 g 3.375 g 12.5 mL/hr   02/21/20 0128 New Bag/Given   piperacillin-tazobactam (ZOSYN) IVPB 3.375 g 3.375 g 12.5 mL/hr   02/21/20 1023 New Bag/Given   piperacillin-tazobactam (ZOSYN) IVPB 3.375 g 3.375 g 12.5 mL/hr   02/21/20 1806 New Bag/Given   piperacillin-tazobactam (ZOSYN) IVPB 3.375 g 3.375 g 12.5 mL/hr     . apixaban  5 mg Oral BID  . butamben-tetracaine-benzocaine  1 spray Topical Once  . Chlorhexidine Gluconate Cloth  6 each Topical Daily  . dextrose  10 mL Intravenous Q24H  . dextrose  10 mL Intravenous Q24H  . feeding supplement (ENSURE ENLIVE)  237 mL Oral TID BM  . flucytosine  25 mg/kg Oral Q6H  . lidocaine (PF)  30 mL Other Once  . lidocaine  1 application Topical Once  . multivitamin with minerals  1 tablet Oral Daily  . predniSONE  10 mg Oral Q breakfast  . sodium chloride  500 mL Intravenous Q24H  . sodium chloride  500 mL Intravenous Q24H  . sodium chloride flush  10-40 mL Intracatheter Q12H  . sodium chloride  1 g Oral TID WC  . thiamine  100 mg Oral Daily     Objective: Vital signs in last 24 hours: Temp:  [97.2 F (36.2 C)-97.4 F (36.3 C)] 97.2 F (36.2 C) (03/16 0740) Pulse Rate:  [93-101] 99 (03/16 1553) Resp:  [16] 16 (03/16 0025) BP: (107-144)/(74-94) 126/83 (03/16 1553) SpO2:  [97 %-100 %] 97 % (03/16 1553) Constitutional: He is thin, on O2,confused and unable to give a history HENT: anicteric Mouth/Throat: Oropharynx is clear and moist. Cardiovascular: Normal rate, regular rhythm and normal heart sounds. Exam reveals no gallop and no friction rub. No murmur heard.  Pulmonary/Chest: rhonchi bil Abdominal: Soft. Bowel sounds are normal. He exhibits no distension. There is no tenderness.  Lymphadenopathy:  He has no cervical adenopathy.  Neurological: able to move all 4, confused but trying to think of answers when questions asked Skin: Skin is warm and dry. No rash noted. No erythema.  Psychiatric: confused  Lab Results Recent Labs    02/21/20 0615 02/22/20 0400 02/22/20 0401  WBC 8.8  --  7.4  HGB 12.5*  --  12.4*  HCT 37.8*  --  37.4*  NA 143 143  --   K 3.7 4.0  --   CL 105 107  --   CO2 27 30  --   BUN 29* 30*  --   CREATININE 0.84 0.80  --     Microbiology: Results for orders placed or  performed during the hospital encounter of 02/09/20  CULTURE, BLOOD (ROUTINE X 2) w Reflex to ID Panel     Status: None   Collection Time: 02/12/20  3:59 PM   Specimen: BLOOD  Result Value Ref Range Status   Specimen Description BLOOD RAC  Final   Special Requests   Final    BOTTLES DRAWN AEROBIC AND ANAEROBIC Blood Culture results may not be optimal due to an excessive volume of blood received in culture bottles   Culture   Final    NO GROWTH 5 DAYS Performed at [facility], [address] [place], [place] [address]    Report Status 02/17/2020 FINAL  Final  CULTURE, BLOOD (ROUTINE X 2) w Reflex to ID Panel     Status: None   Collection Time: 02/12/20  4:07 PM   Specimen: BLOOD RIGHT HAND  Result Value Ref  Range Status   Specimen Description BLOOD RIGHT HAND [facility]  Final   Special Requests   Final    BOTTLES DRAWN AEROBIC AND ANAEROBIC Blood Culture adequate volume   Culture   Final    NO GROWTH 5 DAYS Performed at [facility], [address] [place], [place] [address]    Report Status 02/17/2020 FINAL  Final  MRSA PCR Screening     Status: None   Collection Time: 02/13/20  3:03 PM   Specimen: Nasal Mucosa; Nasopharyngeal  Result Value Ref Range Status   MRSA by PCR NEGATIVE NEGATIVE Final    Comment:        The GeneXpert MRSA Assay (FDA approved for NASAL specimens only), is one component of a comprehensive MRSA colonization surveillance program. It is not intended to diagnose MRSA infection nor to guide or monitor treatment for MRSA infections. Performed at [facility], [address] [place], [place] [address]   Aspergillus Ag, BAL/Serum     Status: None   Collection Time: 02/16/20  6:17 AM   Specimen: Lung, Right  Result Value Ref Range Status   Aspergillus Ag, BAL/Serum 0.03 0.00 - 0.49 Index Final    Comment: (NOTE) Performed At: [facility] [address] [place], [place] [id number] Rush Farmer MD [id number] Performed At: [facility] [address] [place], [place] [id number] Katina Degree MDPhD [id number]   Culture, bal-quantitative     Status: None   Collection Time: 02/18/20 10:00 AM   Specimen: Bronchoalveolar Lavage  Result Value Ref Range Status   Specimen Description   Final    BRONCHIAL ALVEOLAR LAVAGE Performed at [facility], [address] [place], [place] [address]    Special Requests   Final    NONE Performed at [facility], [address] [place], [place] [address]    Gram Stain NO WBC SEEN NO ORGANISMS SEEN   Final   Culture   Final    NO GROWTH 2 DAYS Performed at [facility], [place] [address], [place], [place] [address]    Report Status 02/20/2020 FINAL  Final   Culture, fungus without smear     Status: None (Preliminary result)   Collection Time: 02/18/20 10:02 AM   Specimen: Lung; Cerebrospinal Fluid  Result Value Ref Range Status   Specimen Description   Final    LUNG Performed at [facility], [address], [place], [place] [address]    Special Requests   Final    LLL Performed at [facility], [address] [place], [place] [address]  Culture   Final    NO FUNGUS ISOLATED AFTER 3 DAYS Performed at [facility], [place] [address] [place], [place] [address]    Report Status PENDING  Incomplete  Fungus Culture With Stain     Status: None (Preliminary result)   Collection Time: 02/18/20 10:02 AM   Specimen: Lung, Right  Result Value Ref Range Status   Fungus Stain Final report  Final    Comment: (NOTE) Performed At: [facility] [address] [place], [place] [id number] Rush Farmer MD [id number]    Fungus (Mycology) Culture PENDING  Incomplete   Fungal Source BRONCHIAL ALVEOLAR LAVAGE  Final    Comment: Performed at [facility], [address] [place], [place] [address]  Fungus Culture Result     Status: None   Collection Time: 02/18/20 10:02 AM  Result Value Ref Range Status   Result 1 Comment  Final    Comment: (NOTE) KOH/Calcofluor preparation:  no fungus observed. Performed At: [facility] [place], [place] [id number] Rush Farmer MD [id number]   CSF culture     Status: Abnormal (Preliminary result)   Collection Time: 02/18/20  2:51 PM   Specimen: CSF; Cerebrospinal Fluid  Result Value Ref Range Status   Specimen Description   Final    CSF Performed at [facility], [address] [place], [place] [address]    Special Requests   Final    NONE Performed at [facility], [place], [place] [address]    Gram Stain (A)  Final    YEAST CRITICAL RESULT CALLED TO, READ BACK BY AND VERIFIED WITH: SERENITY  KIRKENDALL AT 1600 02/18/20.PMF WBC SEEN RED BLOOD CELLS PRESENT Performed at [facility], [address] [place], [place] [address]    Culture   Final    NO GROWTH 2 DAYS Performed at [facility], [place] [address], [place],  [address]    Report Status PENDING  Incomplete    Studies/Results: No results found.  Assessment/Plan: AURTHER HARLIN is a 74 y.o. 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 So far MRI brain with possible cerebellar subacute infarct, met or artifact, EEG non diagnostic, BCX neg, echo neg.  The differential dx for his fevers, confusion and pneumonia are broad in this immunocompromised patient and include typical etiologies of viral encephalitis as well as fungal causes. CNS mets or paraneoplastic encephalitis also possible. He is not septic and cultures negative, unlikely to be pyogenic bacterial infection. 3/11- no fevers, wbc 11. Remains confused.   CRAG very high on serum  3/12- bronch with findings of crypto on cytopath 3/15. No fevers. LP with markedly elevated protein. Op was only 9.  Yeast noted on gram stain. Remains on ampho and 30f. Picc 3/16 sleepy, no fevers Recommendations Cont amphotericin and 5Fc. Close following of safety labs, lytes, renal and cell counts Will need 2 weeks IV at least. He should remain inpatient to receive this until he has clinical response. All his path, CSF, bronch etc show Crypto. He has severe disseminated crypto. [facility] she agrees need to decrease steroids and wean off if possible. Yesterday we decrease pred from 15 mg to  10 mg Can  wean as possible given risk of adrenal insuffiencey. Monitor for signs of MG flare but most important to limit immunosuppression Thank you  very much for the consult. Will follow with you.  [name]   02/22/2020, 9:05 PM

## 2020-02-22 NOTE — Progress Notes (Signed)
Physical Therapy Treatment Patient Details Name: Duane Santos MRN: [id number] DOB: [birth year] Today's Date: 02/22/2020    History of Present Illness Pt. presented to ER secondary to AMS, progressive weakness and repeated falls; admitted for management of acute metabolic encephalopathy.  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 PMH significant for MG, covid-19 infection (1/21) and newly-diagnosed R lung cancer.  s/p lung biopsy, pending LP for continued work up.    PT Comments    Pt is making good progress towards goals with increased ability to perform therapy this date. Still requires +2 for safety as he is slightly impulsive. Able to perform there-ex once seated in recliner. Resting HR elevated at 124bpm increases to 148bpm with exertion. O2 sats WNL on RA. Will continue to progress as able. Would need +1 support for ambulation as he isn't aware of physical deficits.    Follow Up Recommendations  Home health PT;Supervision/Assistance - 24 hour     Equipment Recommendations  Rolling walker with 5" wheels;3in1 (PT)    Recommendations for Other Services       Precautions / Restrictions Precautions Precautions: Fall Restrictions Weight Bearing Restrictions: No    Mobility  Bed Mobility Overal bed mobility: Needs Assistance Bed Mobility: Supine to Sit     Supine to sit: Min assist     General bed mobility comments: needs slight assist for trunkal elevation and pt able to scoot towards EOB with cues. Once seated at EOB, able to maintain upright posture safely  Transfers Overall transfer level: Needs assistance Equipment used: Rolling walker (2 wheeled) Transfers: Sit to/from Stand Sit to Stand: Min assist         General transfer comment: able to stand with hands on RW, however mod post lean with hyperextension noted on B LE. Unable to self correct  Ambulation/Gait Ambulation/Gait assistance: Min assist;+2  safety/equipment Gait Distance (Feet): 150 Feet Assistive device: Rolling walker (2 wheeled) Gait Pattern/deviations: Step-through pattern     General Gait Details: ambulated with posterior trunk lean, unable to self correct despite cues. Choppy stepping pattern and needs manual assistance for RW management during turns. Increased fatigue with exertion, although he is unable to fully communicate his fatigue. HR increases to 148bpm post exertion with increased SOB symptoms. Also noted, with B knee flexed position during ambulation   Stairs             Wheelchair Mobility    Modified Rankin (Stroke Patients Only)       Balance Overall balance assessment: Needs assistance Sitting-balance support: No upper extremity supported;Feet supported Sitting balance-Leahy Scale: Good Sitting balance - Comments: Heavy posterior/Left lean.  Unable to use hands appropriately for support. Postural control: Posterior lean Standing balance support: Bilateral upper extremity supported Standing balance-Leahy Scale: Fair                              Cognition Arousal/Alertness: Awake/alert Behavior During Therapy: WFL for tasks assessed/performed Overall Cognitive Status: Impaired/Different from baseline                                 General Comments: Pt able to carry short conversation, however delayed processing and takes extending time for verbal responses to questions.      Exercises Other Exercises Other Exercises: seated ther-ex performed on B LE including LAQ, alt marching, seated crunches against chair  back, SAQ, and hip abd/add. All ther-ex performed x 10 reps with improved attention to task. CGA given    General Comments        Pertinent Vitals/Pain Pain Assessment: No/denies pain    Home Living                      Prior Function            PT Goals (current goals can now be found in the care plan section) Acute Rehab PT  Goals Patient Stated Goal: agreeable to session PT Goal Formulation: With patient Time For Goal Achievement: 02/25/20 Potential to Achieve Goals: Fair Progress towards PT goals: Progressing toward goals    Frequency    Min 2X/week      PT Plan Current plan remains appropriate    Co-evaluation              AM-PAC PT "6 Clicks" Mobility   Outcome Measure  Help needed turning from your back to your side while in a flat bed without using bedrails?: A Little Help needed moving from lying on your back to sitting on the side of a flat bed without using bedrails?: A Little Help needed moving to and from a bed to a chair (including a wheelchair)?: A Little Help needed standing up from a chair using your arms (e.g., wheelchair or bedside chair)?: A Little Help needed to walk in hospital room?: A Little Help needed climbing 3-5 steps with a railing? : A Lot 6 Click Score: 17    End of Session Equipment Utilized During Treatment: Gait belt Activity Tolerance: Patient tolerated treatment well;Patient limited by fatigue Patient left: in chair;with call bell/phone within reach;with chair alarm set Nurse Communication: Mobility status PT Visit Diagnosis: Muscle weakness (generalized) (M62.81);Other abnormalities of gait and mobility (R26.89);Unsteadiness on feet (R26.81);Other symptoms and signs involving the nervous system (R29.898);Repeated falls (R29.6)     Time: 7048-8891 PT Time Calculation (min) (ACUTE ONLY): 25 min  Charges:  $Gait Training: 8-22 mins $Therapeutic Exercise: 8-22 mins                     Greggory Stallion, PT, DPT [phone]    Sarah Baez 02/22/2020, 2:45 PM

## 2020-02-22 NOTE — TOC Progression Note (Signed)
Transition of Care [facility]) - Progression Note    Patient Details  Name: Duane Santos MRN: [id number] Date of Birth: [birth year]  Transition of Care [facility]) CM/SW Contact  Kamarri Fischetti, Gardiner Rhyme, LCSW Phone Number: 02/22/2020, 10:54 AM  Clinical Narrative: Spoke with Caitlyn_liaison [facility] she has insurance authorization and would like him to transfer today. Have let MD know and called wife. Wife felt he would stay here for another 5 days then be ready for discharge. Wife wants MD to call her and have messaged MD to ask her to do this. Dr Jimmye Norman reports he needs to stay here another 5 days to see the clinical response. Have spoke with Caitlyn-liaison [facility] to inform of this. Will continue to work on discharge, concern is in 5 days insurance may not approve for him to go to [facility]. Will await medical readiness for transfer to [facility]    Expected Discharge Plan: [place] Barriers to Discharge: Continued Medical Work up  Expected Discharge Plan and Services Expected Discharge Plan: [place] In-house Referral: Clinical Social Work   Post Acute Care Choice: Home Health, Durable Medical Equipment Living arrangements for the past 2 months: Single Family Home Expected Discharge Date: 02/15/20                                     Social Determinants of Health (SDOH) Interventions    Readmission Risk Interventions Readmission Risk Prevention Plan 12/30/2019  Transportation Screening Complete  Home Care Screening Complete  Medication Review (RN CM) Complete  Some recent data might be hidden

## 2020-02-22 NOTE — Plan of Care (Signed)
  Problem: Clinical Measurements: Goal: Ability to maintain clinical measurements within normal limits will improve Outcome: Progressing Goal: Will remain free from infection Outcome: Progressing Goal: Diagnostic test results will improve Outcome: Progressing Goal: Respiratory complications will improve Outcome: Progressing Goal: Cardiovascular complication will be avoided Outcome: Progressing   Problem: Activity: Goal: Risk for activity intolerance will decrease Outcome: Progressing   Problem: Coping: Goal: Level of anxiety will decrease Outcome: Progressing   Problem: Pain Managment: Goal: General experience of comfort will improve Outcome: Progressing   Problem: Safety: Goal: Ability to remain free from injury will improve Outcome: Progressing

## 2020-02-22 NOTE — Progress Notes (Signed)
Subjective: Patient sitting up in bed eating breakfast.  No complaints at this time.    Objective: Current vital signs: BP (!) 144/94 (BP Location: Right Arm)   Pulse (!) 101   Temp (!) 97.2 F (36.2 C)   Resp 16   Ht _0  (1.676 m)   Wt 54 kg   SpO2 100%   BMI 19.21 kg/m  Vital signs in last 24 hours: Temp:  [97.2 F (36.2 C)-98 F (36.7 C)] 97.2 F (36.2 C) (03/16 0740) Pulse Rate:  [93-101] 101 (03/16 0740) Resp:  [16-18] 16 (03/16 0025) BP: (106-144)/(71-94) 144/94 (03/16 0740) SpO2:  [98 %-100 %] 100 % (03/16 0740)  Intake/Output from previous day: 03/15 0701 - 03/16 0700 In: 720 [P.O.:720] Out: 975 [Urine:975] Intake/Output this shift: No intake/output data recorded. Nutritional status:  Diet Order            DIET DYS 3 Room service appropriate? Yes; Fluid consistency: Thin  Diet effective now              Neurologic Exam: Mental Status: Alert, oriented to person, place and year.  Able to tell me how many nickels in a quarter and how many dimes in a dollar.  Follows simple commands.  Speech fluent without evidence of aphasia. Cranial Nerves: II: Visual fields grossly normal III,IV, VI: ptosis not present, extra-ocular motions intact bilaterally V,VII: smile symmetric, facial light touch sensation normal bilaterally VIII: hearing normal bilaterally IX,X: gag reflex present XI: bilateral shoulder shrug XII: midline tongue extension Motor: Lifts all extremities against gravity with no focal weakness appreciated Sensory: Pinprick and light touch intact throughout, bilaterally   Lab Results: Basic Metabolic Panel: Recent Labs  Lab 02/18/20 0603 02/18/20 0603 02/19/20 0538 02/19/20 0538 02/20/20 0522 02/21/20 0615 02/22/20 0400  NA 145  --  142  --  141 143 143  K 3.9  --  3.6  --  3.5 3.7 4.0  CL 106  --  105  --  104 105 107  CO2 29  --  28  --  _1 GLUCOSE 101*  --  102*  --  106* 119* 99  BUN 27*  --  18  --  20 29* 30*  CREATININE  0.76  --  0.67  --  0.66 0.84 0.80  CALCIUM 9.5   < > 8.7*   < > 9.4 9.6 9.9  MG 2.6*  --  2.4  --  2.3 2.7* 2.6*   < > = values in this interval not displayed.    Liver Function Tests: No results for input(s): AST, ALT, ALKPHOS, BILITOT, PROT, ALBUMIN in the last 168 hours. No results for input(s): LIPASE, AMYLASE in the last 168 hours. No results for input(s): AMMONIA in the last 168 hours.  CBC: Recent Labs  Lab 02/18/20 0603 02/19/20 0538 02/20/20 0522 02/21/20 0615 02/22/20 0401  WBC 13.8* 10.6* 9.4 8.8 7.4  HGB 12.6* 11.9* 12.8* 12.5* 12.4*  HCT 40.5 37.6* 38.4* 37.8* 37.4*  MCV 105.2* 102.7* 100.0 99.7 101.1*  PLT 263 214 232 242 236    Cardiac Enzymes: No results for input(s): CKTOTAL, CKMB, CKMBINDEX, TROPONINI in the last 168 hours.  Lipid Panel: No results for input(s): CHOL, TRIG, HDL, CHOLHDL, VLDL, LDLCALC in the last 168 hours.  CBG: Recent Labs  Lab 02/18/20 [place]*    Microbiology: Results for orders placed or performed during the hospital encounter of 02/09/20  CULTURE, BLOOD (ROUTINE X 2) w  Reflex to ID Panel     Status: None   Collection Time: 02/12/20  3:59 PM   Specimen: BLOOD  Result Value Ref Range Status   Specimen Description BLOOD RAC  Final   Special Requests   Final    BOTTLES DRAWN AEROBIC AND ANAEROBIC Blood Culture results may not be optimal due to an excessive volume of blood received in culture bottles   Culture   Final    NO GROWTH 5 DAYS Performed at [facility], [address] [place], [place] [address]    Report Status 02/17/2020 FINAL  Final  CULTURE, BLOOD (ROUTINE X 2) w Reflex to ID Panel     Status: None   Collection Time: 02/12/20  4:07 PM   Specimen: BLOOD RIGHT HAND  Result Value Ref Range Status   Specimen Description BLOOD RIGHT HAND [facility]  Final   Special Requests   Final    BOTTLES DRAWN AEROBIC AND ANAEROBIC Blood Culture adequate volume   Culture   Final    NO GROWTH 5 DAYS Performed  at [facility], [address] [place], [place] [address]    Report Status 02/17/2020 FINAL  Final  MRSA PCR Screening     Status: None   Collection Time: 02/13/20  3:03 PM   Specimen: Nasal Mucosa; Nasopharyngeal  Result Value Ref Range Status   MRSA by PCR NEGATIVE NEGATIVE Final    Comment:        The GeneXpert MRSA Assay (FDA approved for NASAL specimens only), is one component of a comprehensive MRSA colonization surveillance program. It is not intended to diagnose MRSA infection nor to guide or monitor treatment for MRSA infections. Performed at [facility], [address] [place], [place] [address]   Aspergillus Ag, BAL/Serum     Status: None   Collection Time: 02/16/20  6:17 AM   Specimen: Lung, Right  Result Value Ref Range Status   Aspergillus Ag, BAL/Serum 0.03 0.00 - 0.49 Index Final    Comment: (NOTE) Performed At: [facility] [address] [place], [place] [id number] Rush Farmer MD UU:[id number] Performed At: [facility] [address] [place], [place] [id number] Katina Degree MDPhD YI:[id number]   Culture, bal-quantitative     Status: None   Collection Time: 02/18/20 10:00 AM   Specimen: Bronchoalveolar Lavage  Result Value Ref Range Status   Specimen Description   Final    BRONCHIAL ALVEOLAR LAVAGE Performed at [facility], [address] [place], [place] [address]    Special Requests   Final    NONE Performed at [facility], [address] [place], [place] [address]    Gram Stain NO WBC SEEN NO ORGANISMS SEEN   Final   Culture   Final    NO GROWTH 2 DAYS Performed at [facility], [place] [address] [place], [place] [address]    Report Status 02/20/2020 FINAL  Final  Culture, fungus without smear     Status: None (Preliminary result)   Collection Time: 02/18/20 10:02 AM   Specimen: Lung; Cerebrospinal Fluid  Result Value Ref Range Status   Specimen Description   Final     LUNG Performed at [facility], [address] [place], [place] [address]    Special Requests   Final    LLL Performed at [facility], [address], [place], [place] [address]    Culture   Final    NO FUNGUS ISOLATED AFTER 3  DAYS Performed at [facility], [place] [address], [place], [place] [address]    Report Status PENDING  Incomplete  Fungus Culture With Stain     Status: None (Preliminary result)   Collection Time: 02/18/20 10:02 AM   Specimen: Lung, Right  Result Value Ref Range Status   Fungus Stain Final report  Final    Comment: (NOTE) Performed At: [facility] [address] [place], [place] [id number] Rush Farmer MD [id number]    Fungus (Mycology) Culture PENDING  Incomplete   Fungal Source BRONCHIAL ALVEOLAR LAVAGE  Final    Comment: Performed at [facility], [address] [place], [place] [address]  Fungus Culture Result     Status: None   Collection Time: 02/18/20 10:02 AM  Result Value Ref Range Status   Result 1 Comment  Final    Comment: (NOTE) KOH/Calcofluor preparation:  no fungus observed. Performed At: [facility] [place], [place] [id number] Rush Farmer MD WI:[id number]   CSF culture     Status: Abnormal (Preliminary result)   Collection Time: 02/18/20  2:51 PM   Specimen: CSF; Cerebrospinal Fluid  Result Value Ref Range Status   Specimen Description   Final    CSF Performed at [facility], [address], [place], [place] [address]    Special Requests   Final    NONE Performed at [facility], [place], [place] [address]    Gram Stain (A)  Final    YEAST CRITICAL RESULT CALLED TO, READ BACK BY AND VERIFIED WITH: SERENITY KIRKENDALL AT 1600 02/18/20.PMF WBC SEEN RED BLOOD CELLS PRESENT Performed at [facility], [address] [place], [place] [address]    Culture   Final    NO GROWTH 2 DAYS Performed at [facility], [place] [address], [place], [place] [address]    Report Status PENDING  Incomplete    Coagulation Studies: No results for input(s): LABPROT, INR in the last 72 hours.  Imaging: No results found.  Medications:  I have reviewed the patient's current medications. Scheduled: . apixaban  5 mg Oral BID  . butamben-tetracaine-benzocaine  1 spray Topical Once  . Chlorhexidine Gluconate Cloth  6 each Topical Daily  . dextrose  10 mL Intravenous Q24H  . dextrose  10 mL Intravenous Q24H  . feeding supplement (ENSURE ENLIVE)  237 mL Oral TID BM  . flucytosine  25 mg/kg Oral Q6H  . lidocaine (PF)  30 mL Other Once  . lidocaine  1 application Topical Once  . multivitamin with minerals  1 tablet Oral Daily  . predniSONE  10 mg Oral Q breakfast  . sodium chloride  500 mL Intravenous Q24H  . sodium chloride  500 mL Intravenous Q24H  . sodium chloride flush  10-40 mL Intracatheter Q12H  . sodium chloride  1 g Oral TID WC  . 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 MRI of the brain personally reviewed and shows several small areas of restricted diffusion in the cerebellum bilaterally. Likely acute/subacute infarcts but can not rule out metastasis or infection.  Patient has had bronchoscopy that was suggestive of cryptococcus.  With positive cryptococcus titers as well.  CSF performed with normal opening pressure.  Gram stain positive for yeast.  Protein elevated.  Glucose decreased.  Cytology pending.  Patient followed  by ID and started on Amphotericin.  Presenting symptoms likely multifactorial in etiology and related to recent COVID infection, hyponatremia and cryptococcus infection.  Steroid taper has begun with plans to discontinue completely.  Mental status appears to be improving somewhat.    Recommendations: 1. 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  At this time exam is not suggestive of a MG flare.  Would consider start of  Mestinon if this were to change.   2. Repeat MRI of the brain with and without contrast    LOS: 11 days   Alexis Goodell, MD Neurology [phone] 02/22/2020  9:01 AM

## 2020-02-22 NOTE — Progress Notes (Addendum)
PROGRESS NOTE    Duane Santos  XIP:[id number] DOB: 1946-02-14 DOA: 02/09/2020 PCP: [facility]    HPI was taken from Dr. Damita Dunnings: Duane Santos is a 74 y.o. male with medical history significant for myasthenia gravis, history of COVID-19 pneumonia with hypoxia diagnosed 12/13/2019, hospitalized from 12/21/2019 to 01/03/2020 discharged on oxygen, currently on O2 at 3 L, who also has a history of right lower lobe mass diagnosed during his hospitalization, last seen by oncology on 01/28/2020, pending PET scan, currently on Eliquis for PE and DVT diagnosed during his hospitalization, who presents to the emergency room with two weeks off onset confusion around 01/31/20, associated with persistent protracted weakness and falls since his hospitalization associated with falls.  History is taken from his wife and caregiver due to patient's confusion.  She states that at the onset of the symptoms they spoke with his neurologist for the phone and they did not think that the symptoms had to do with the myasthenia gravis.  She stated that they recommended that he goes no lower than 20 mg on his daily prednisone.  He has had no cough, chest pain fever or chills.  No complaints of nausea, vomiting, abdominal pain or change in bowel habits.  No reports of one-sided weakness numbness or tingling.  Prior to Covid, patient was independent and very active walking 5K every day.  However following his discharge she has been progressively more deconditioned and needing to ambulate with a walker. ED Course: On arrival in the emergency room he had a low-grade temperature of 99, BP 134/68, HR 108, RR 22 with O2 sat 100% on 2 L.  Work was significant for a sodium of 128 with otherwise normal chemistries.  WBC 10,600, hemoglobin 12.6.  Urinalysis unremarkable.  Head CT showed no acute intracranial findings, chest x-ray showed persistent but improving bilateral pulmonary opacities.  Patient was started on normal saline.  TRH  consulted for admission  Hospital Course from Dr. Lenise Herald 3/10-2/16: Pt was found to have acute confusional state on unclear etiology, initially thought to be secondary to subacute CVA vs metastatic cancer. Pt was found to have a pulmon nodule as well as pneumonia. Pt was receiving IV abxs. Of note, pt has been on chronic steroids for myasthenia gravis. Pt was not improving so ID order some fungal cultures/tests and serum cryptococcus antigen came back positive. Pt was start IV amphotericin & flucytosine as per ID. Pt also had a bronchoscopy w/ biopsy of lung nodule which showed cryptococcus species and NO malignancy. ID recs pt stay inpatient for another 5-7 days until he has a clinical response.      Assessment & Plan:   Principal Problem:   Acute confusional state Active Problems:   Multifocal pneumonia   Myasthenia gravis ([place])   Right lower lobe lung mass   Generalized weakness   Hyponatremia   History of COVID-19 pneumonia with hypoxia   Acute metabolic encephalopathy   Chronic respiratory failure with hypoxia (HCC)   Palliative care encounter   Acute confusional state: likely secondary to crypto meningoenephalitis. Intermittent, oriented to person, month, year only. Echo shows EF 65-70%, interatrial septum was not well visualized. 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 Continue on amphotericin B, flucytosine as per ID. S/p LP w/ cultures growing yeast  Severe cryptococcus meningoencephalitis: continue on IV amphotericin, po flucytosine and pt should remain inpatient for another 5-7 days as per ID. CSF cx growing yeast  Possible subacute CVA : as per MRI. Continue w/  neuro checks. PT/OT initially recommending SNF, now PT recs "CIR as per wife's request". Repeat MRI brain as per neuro  Multifocal pneumonia: possible hospital-acquired, immunocompromised state. No more fevers in at least 72 hours. 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  Continue fluid restriction and salt tablets.   Right lung nodule: s/p bronchoscopy w/ biopsy on 02/18/20, path showing cryptococcus species. Sclerotic changes of the right third fourth and fifth ribs. Follow-up with palliative care. Pulmon following and recs apprec  Recent pulmonary embolism and DVT: diagnosed on December 23, 2019. Eliquis on hold, continue on lovenox  Myasthenia gravis: continue prednisone taper as per ID. Monitor for MG exacerbation post steroid taper. 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 Recommend annual imaging followup by CTA or MRA  Depression: psychiatry consulted as per wife's request. Psych following and recs apprec   Generalized weakness/debility: PT/OT initially recommending SNF, now PT recs "CIR as per wife's request". BMI 19.21    DVT prophylaxis: lovenox  Code Status: DNR Family Communication: discussed pt's care w/ pt's wife, Duane Santos, and answered her questions. Please call Duane Santos daily for updates Disposition Plan:  ID is rec pt stay inpatient for another 5-7 days while on IV amp  Consultants:   Pulmon  Palliative care  Psychiatry   Nephro  Neuro  ID   Procedures:    Antimicrobials:    Subjective: Pt c/o fatigue  Objective: Vitals:   02/21/20 1427 02/21/20 1643 02/22/20 0025 02/22/20 0740  BP: 106/72 106/71 107/74 (!) 144/94  Pulse: 93 93 93 (!) 101  Resp: _0 Temp:  98 F (36.7 C) (!) 97.4 F (36.3 C) (!) 97.2 F (36.2 C)  TempSrc:   Oral   SpO2: 100% 100% 98% 100%  Weight:      Height:        Intake/Output Summary (Last 24 hours) at 02/22/2020 1339 Last data filed at 02/22/2020 0913 Gross per 24 hour  Intake 480 ml  Output 375 ml  Net 105 ml   Filed Weights   02/09/20 1656  Weight: 54 kg    Examination:  General exam: Appears calm  and comfortable  Respiratory system: diminished breath sounds b/l. No rales, rhonchi Cardiovascular system: S1 & S2. No rubs, gallops or clicks.  Gastrointestinal system: Abdomen is nondistended, soft and nontender. normal bowel sounds heard.  Central nervous system: Alert and awake. Moves all 4 extremities  Psychiatry: Judgement and insight appear abnormal. Flat mood and affect    Data Reviewed: I have personally reviewed following labs and imaging studies  CBC: Recent Labs  Lab 02/18/20 0603 02/19/20 0538 02/20/20 0522 02/21/20 0615 02/22/20 0401  WBC 13.8* 10.6* 9.4 8.8 7.4  HGB 12.6* 11.9* 12.8* 12.5* 12.4*  HCT 40.5 37.6* 38.4* 37.8* 37.4*  MCV 105.2* 102.7* 100.0 99.7 101.1*  PLT 263 214 232 242 224   Basic Metabolic Panel: Recent Labs  Lab 02/18/20 0603 02/19/20 0538 02/20/20 0522 02/21/20 0615 02/22/20 0400  NA 145 142 141 143 143  K 3.9 3.6 3.5 3.7 4.0  CL 106 105 104 105 107  CO2 _1 GLUCOSE 101* 102* 106* 119* 99  BUN 27* 18 20 29* 30*  CREATININE 0.76 0.67 0.66 0.84 0.80  CALCIUM 9.5 8.7* 9.4 9.6 9.9  MG 2.6* 2.4  2.3 2.7* 2.6*   GFR: Estimated Creatinine Clearance: 63.8 mL/min (by C-G formula based on SCr of 0.8 mg/dL). Liver Function Tests: No results for input(s): AST, ALT, ALKPHOS, BILITOT, PROT, ALBUMIN in the last 168 hours. No results for input(s): LIPASE, AMYLASE in the last 168 hours. No results for input(s): AMMONIA in the last 168 hours. Coagulation Profile: Recent Labs  Lab 02/17/20 1512  INR 0.9   Cardiac Enzymes: No results for input(s): CKTOTAL, CKMB, CKMBINDEX, TROPONINI in the last 168 hours. BNP (last 3 results) No results for input(s): PROBNP in the last 8760 hours. HbA1C: No results for input(s): HGBA1C in the last 72 hours. CBG: Recent Labs  Lab 02/18/20 1556  GLUCAP 102*   Lipid Profile: No results for input(s): CHOL, HDL, LDLCALC, TRIG, CHOLHDL, LDLDIRECT in the last 72 hours. Thyroid Function  Tests: No results for input(s): TSH, T4TOTAL, FREET4, T3FREE, THYROIDAB in the last 72 hours. Anemia Panel: No results for input(s): VITAMINB12, FOLATE, FERRITIN, TIBC, IRON, RETICCTPCT in the last 72 hours. Sepsis Labs: No results for input(s): PROCALCITON, LATICACIDVEN in the last 168 hours.  Recent Results (from the past 240 hour(s))  CULTURE, BLOOD (ROUTINE X 2) w Reflex to ID Panel     Status: None   Collection Time: 02/12/20  3:59 PM   Specimen: BLOOD  Result Value Ref Range Status   Specimen Description BLOOD RAC  Final   Special Requests   Final    BOTTLES DRAWN AEROBIC AND ANAEROBIC Blood Culture results may not be optimal due to an excessive volume of blood received in culture bottles   Culture   Final    NO GROWTH 5 DAYS Performed at [facility], [address] [place], [place] [address]    Report Status 02/17/2020 FINAL  Final  CULTURE, BLOOD (ROUTINE X 2) w Reflex to ID Panel     Status: None   Collection Time: 02/12/20  4:07 PM   Specimen: BLOOD RIGHT HAND  Result Value Ref Range Status   Specimen Description BLOOD RIGHT HAND [facility]  Final   Special Requests   Final    BOTTLES DRAWN AEROBIC AND ANAEROBIC Blood Culture adequate volume   Culture   Final    NO GROWTH 5 DAYS Performed at [facility], [address] [place], [place] [address]    Report Status 02/17/2020 FINAL  Final  MRSA PCR Screening     Status: None   Collection Time: 02/13/20  3:03 PM   Specimen: Nasal Mucosa; Nasopharyngeal  Result Value Ref Range Status   MRSA by PCR NEGATIVE NEGATIVE Final    Comment:        The GeneXpert MRSA Assay (FDA approved for NASAL specimens only), is one component of a comprehensive MRSA colonization surveillance program. It is not intended to diagnose MRSA infection nor to guide or monitor treatment for MRSA infections. Performed at [facility], [address] [place], [place] [address]   Aspergillus Ag, BAL/Serum     Status:  None   Collection Time: 02/16/20  6:17 AM   Specimen: Lung, Right  Result Value Ref Range Status   Aspergillus Ag, BAL/Serum 0.03 0.00 - 0.49 Index Final    Comment: (NOTE) Performed At: [facility] [address] [place], [place] [id number] Rush Farmer MD XB:[id number] Performed At: [facility] [address] [place], [place] [id number] Katina Degree MDPhD QI:[id number]   Culture, bal-quantitative     Status: None   Collection Time: 02/18/20 10:00 AM  Specimen: Bronchoalveolar Lavage  Result Value Ref Range Status   Specimen Description   Final    BRONCHIAL ALVEOLAR LAVAGE Performed at [facility], [address] [place], [place] [address]    Special Requests   Final    NONE Performed at [facility], [place], [place] [address]    Gram Stain NO WBC SEEN NO ORGANISMS SEEN   Final   Culture   Final    NO GROWTH 2 DAYS Performed at [facility], [place] [address], [place], [place] [address]    Report Status 02/20/2020 FINAL  Final  Culture, fungus without smear     Status: None (Preliminary result)   Collection Time: 02/18/20 10:02 AM   Specimen: Lung; Cerebrospinal Fluid  Result Value Ref Range Status   Specimen Description   Final    LUNG Performed at [facility], [address] [place], [place] [address]    Special Requests   Final    LLL Performed at [facility], [address], [place], [place] [address]    Culture   Final    NO FUNGUS ISOLATED AFTER 3 DAYS Performed at [facility], [place] [address], [place], [place] [address]    Report Status PENDING  Incomplete  Fungus Culture With Stain     Status: None (Preliminary result)   Collection Time: 02/18/20 10:02 AM   Specimen: Lung, Right  Result Value Ref Range Status   Fungus Stain Final report  Final    Comment: (NOTE) Performed At: [facility] [address] [place], [place] [id number] Rush Farmer MD  [id number]    Fungus (Mycology) Culture PENDING  Incomplete   Fungal Source BRONCHIAL ALVEOLAR LAVAGE  Final    Comment: Performed at [facility], [address] [place], [place] [address]  Fungus Culture Result     Status: None   Collection Time: 02/18/20 10:02 AM  Result Value Ref Range Status   Result 1 Comment  Final    Comment: (NOTE) KOH/Calcofluor preparation:  no fungus observed. Performed At: [facility] [place], [place] [id number] Rush Farmer MD OE:[id number]   CSF culture     Status: Abnormal (Preliminary result)   Collection Time: 02/18/20  2:51 PM   Specimen: CSF; Cerebrospinal Fluid  Result Value Ref Range Status   Specimen Description   Final    CSF Performed at [facility], [address], [place], [place] [address]    Special Requests   Final    NONE Performed at [facility], [place], [place] [address]    Gram Stain (A)  Final    YEAST CRITICAL RESULT CALLED TO, READ BACK BY AND VERIFIED WITH: SERENITY KIRKENDALL AT 1600 02/18/20.PMF WBC SEEN RED BLOOD CELLS PRESENT Performed at [facility], [address] [place], [place] [address]    Culture   Final    NO GROWTH 2 DAYS Performed at [facility], [place] [address], [place], [place] [address]    Report Status PENDING  Incomplete         Radiology Studies: No results found.      Scheduled Meds: . apixaban  5 mg Oral BID  . butamben-tetracaine-benzocaine  1 spray Topical Once  . Chlorhexidine Gluconate Cloth  6 each Topical Daily  . dextrose  10 mL Intravenous Q24H  . dextrose  10 mL Intravenous Q24H  . feeding supplement (ENSURE ENLIVE)  237 mL Oral TID BM  . flucytosine  25 mg/kg Oral Q6H  . lidocaine (PF)  30 mL Other Once  . lidocaine  1 application Topical Once  . multivitamin with minerals  1 tablet Oral Daily  . predniSONE  10 mg Oral Q breakfast  . sodium chloride  500 mL Intravenous Q24H   . sodium chloride  500 mL Intravenous Q24H  . sodium chloride flush  10-40 mL Intracatheter Q12H  . sodium chloride  1 g Oral TID WC  . thiamine  100 mg Oral Daily   Continuous Infusions: . amphotericin  B  Liposome (AMBISOME) ADULT IV 200 mg (02/22/20 0007)     LOS: 11 days    Time spent: 30 mins     Wyvonnia Dusky, MD [facility] Pager [phone]  If 7PM-7AM, please contact night-coverage [email or web address] 02/22/2020, 1:39 PM

## 2020-02-22 NOTE — Progress Notes (Signed)
Pharmacy - Liposomal Amphotericin Monitoring  23 YOM with myasthenia gravis on prednisone/mycophenolate.  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  Started on amphotericin - liposomal and flucytosine evening on 3/11. He had LP and BAL performed 3/12 - CSF cx with yeast .  Lung cx unrevealing  Labs 02/22/2020 - K = 4.0 - Mg = 2.6 - SCr = 0.80 - slightly higher vs. previous two days (0.66 mg/dl)   Plan:  Continue Ampohtericin 285m (3.791mkg) IV q24h  Normal saline 50053molus pre and post dose with D5W 43m93mushes in between boluses  Flucytosine 1250mg48mmg/109mPO q6h   No additional electrolyte replacement needed at this time.  Check daily BMP and Mg and replete K+ and Mg daily as needed as both amphotericin and flucytosine can cause hypokalemia.  Monitor renal function  Monitor CBC and LFTs periodically (flucytosine can cause hepatoxicity and bone marrow suppression).   Zosyn 3.375gm IV q8h over 4h infusion - f/u plan/legnth of therapy  AsajahKristeen MissmD Clinical Pharmacist 02/22/2020 7:27 AM

## 2020-02-22 NOTE — TOC Progression Note (Signed)
Transition of Care [facility]) - Progression Note    Patient Details  Name: Duane Santos MRN: [id number] Date of Birth: Apr 09, 1946  [facility] CM/SW Contact  Payslie Mccaig, Gardiner Rhyme, LCSW Phone Number: 02/22/2020, 9:02 AM  Clinical Narrative:  Message from [place] has not heard back from pt's insurance regarding approval. Should hear something later today. Aware newer therapy notes in chart. Wife wants to hear from CIR before making any other decision regarding discharge plan.     Expected Discharge Plan: [place] Barriers to Discharge: Continued Medical Work up  Expected Discharge Plan and Services Expected Discharge Plan: [place] In-house Referral: Clinical Social Work   Post Acute Care Choice: Home Health, Durable Medical Equipment Living arrangements for the past 2 months: Single Family Home Expected Discharge Date: 02/15/20                                     Social Determinants of Health (SDOH) Interventions    Readmission Risk Interventions Readmission Risk Prevention Plan 12/30/2019  Transportation Screening Complete  Home Care Screening Complete  Medication Review (RN CM) Complete  Some recent data might be hidden

## 2020-02-22 NOTE — Progress Notes (Signed)
Inpatient Rehab Admissions Coordinator:   I have insurance authorization for pt to admit to CIR. Per ID note, pt to remain inpatient to await clinical response to IV amphotericin and flucytosine.  Will f/u for medical readiness for potential admission to CIR, pending pt's functional progress and bed availability.   Shann Medal, PT, DPT Admissions Coordinator [phone] 02/22/20  1:42 PM

## 2020-02-23 ENCOUNTER — Inpatient Hospital Stay: Payer: Medicare HMO | Admitting: Hematology and Oncology

## 2020-02-23 ENCOUNTER — Inpatient Hospital Stay: Payer: Medicare HMO

## 2020-02-23 LAB — COMPREHENSIVE METABOLIC PANEL
ALT: 57 U/L — ABNORMAL HIGH (ref 0–44)
AST: 33 U/L (ref 15–41)
Albumin: 3.3 g/dL — ABNORMAL LOW (ref 3.5–5.0)
Alkaline Phosphatase: 75 U/L (ref 38–126)
Anion gap: 10 (ref 5–15)
BUN: 26 mg/dL — ABNORMAL HIGH (ref 8–23)
CO2: 27 mmol/L (ref 22–32)
Calcium: 10.3 mg/dL (ref 8.9–10.3)
Chloride: 106 mmol/L (ref 98–111)
Creatinine, Ser: 0.81 mg/dL (ref 0.61–1.24)
GFR calc Af Amer: >60 mL/min (ref >60)
GFR calc non Af Amer: >60 mL/min (ref >60)
Glucose, Bld: 98 mg/dL (ref 70–99)
Potassium: 3.9 mmol/L (ref 3.5–5.1)
Sodium: 143 mmol/L (ref 135–145)
Total Bilirubin: 0.6 mg/dL (ref 0.3–1.2)
Total Protein: 6.9 g/dL (ref 6.5–8.1)

## 2020-02-23 LAB — CBC
HCT: 40.7 % (ref 39.0–52.0)
Hemoglobin: 13.9 g/dL (ref 13.0–17.0)
MCH: 34 pg (ref 26.0–34.0)
MCHC: 34.2 g/dL (ref 30.0–36.0)
MCV: 99.5 fL (ref 80.0–100.0)
Platelets: 245 10*3/uL (ref 150–400)
RBC: 4.09 MIL/uL — ABNORMAL LOW (ref 4.22–5.81)
RDW: 15.5 % (ref 11.5–15.5)
WBC: 9.2 10*3/uL (ref 4.0–10.5)
nRBC: 0 % (ref 0.0–0.2)

## 2020-02-23 LAB — MAGNESIUM: Magnesium: 2.6 mg/dL — ABNORMAL HIGH (ref 1.7–2.4)

## 2020-02-23 MED ORDER — PREDNISONE 10 MG PO TABS
5.0000 mg | ORAL_TABLET | Freq: Every day | ORAL | Status: DC
  Administered 2020-02-24: 5 mg via ORAL
  Filled 2020-02-23: qty 1

## 2020-02-23 NOTE — Progress Notes (Addendum)
PROGRESS NOTE    Duane Santos  NHA:[id number] DOB: [birth year] DOA: 02/09/2020 PCP: [facility]    HPI was taken from Dr. Damita Dunnings: Duane Santos is a 74 y.o. male with medical history significant for myasthenia gravis, history of COVID-19 pneumonia with hypoxia diagnosed 12/13/2019, hospitalized from 12/21/2019 to 01/03/2020 discharged on oxygen, currently on O2 at 3 L, who also has a history of right lower lobe mass diagnosed during his hospitalization, last seen by oncology on 01/28/2020, pending PET scan, currently on Eliquis for PE and DVT diagnosed during his hospitalization, who presents to the emergency room with two weeks off onset confusion around 01/31/20, associated with persistent protracted weakness and falls since his hospitalization associated with falls.  History is taken from his wife and caregiver due to patient's confusion.  She states that at the onset of the symptoms they spoke with his neurologist for the phone and they did not think that the symptoms had to do with the myasthenia gravis.  She stated that they recommended that he goes no lower than 20 mg on his daily prednisone.  He has had no cough, chest pain fever or chills.  No complaints of nausea, vomiting, abdominal pain or change in bowel habits.  No reports of one-sided weakness numbness or tingling.  Prior to Covid, patient was independent and very active walking 5K every day.  However following his discharge she has been progressively more deconditioned and needing to ambulate with a walker.  ED Course: On arrival in the emergency room he had a low-grade temperature of 99, BP 134/68, HR 108, RR 22 with O2 sat 100% on 2 L.  Work was significant for a sodium of 128 with otherwise normal chemistries.  WBC 10,600, hemoglobin 12.6.  Urinalysis unremarkable.  Head CT showed no acute intracranial findings, chest x-ray showed persistent but improving bilateral pulmonary opacities.  Patient was started on normal saline.  TRH  consulted for admission  Hospital Course from Dr. Lenise Herald 3/10-2/16: Pt was found to have acute confusional state on unclear etiology, initially thought to be secondary to subacute CVA vs metastatic cancer. Pt was found to have a pulmon nodule as well as pneumonia. Pt was receiving IV abxs. Of note, pt has been on chronic steroids for myasthenia gravis. Pt was not improving so ID order some fungal cultures/tests and serum cryptococcus antigen came back positive. Pt was start IV amphotericin & flucytosine as per ID. Pt also had a bronchoscopy w/ biopsy of lung nodule which showed cryptococcus species and NO malignancy. ID recs pt stay inpatient for another 1-2 days before transfer to CIR. He will likely need 2 weeks IV at least and 4-6 weeks of induction treatment. Tapering prednisone to 5 mg today/ March 17th (was on 10 mg)     Assessment & Plan:   Principal Problem:   Acute confusional state Active Problems:   Multifocal pneumonia   Myasthenia gravis ([place])   Right lower lobe lung mass   Generalized weakness   Hyponatremia   History of COVID-19 pneumonia with hypoxia   Acute metabolic encephalopathy   Chronic respiratory failure with hypoxia (HCC)   Palliative care encounter  Acute confusional state: likely secondary to crypto meningoenephalitis. Intermittent, oriented to person, month, year only. Echo shows EF 65-70%, interatrial septum was not well visualized. 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 Continue on amphotericin B, flucytosine as per ID. S/p LP w/ cultures growing yeast  Severe cryptococcus meningoencephalitis: continue on IV amphotericin, po flucytosine and pt  should remain inpatient for another 1-2 days as per ID (will eventually need long term IV - begin with 2 weeks total and 4-6 weeks of induction). CSF cx growing yeast  Possible subacute CVA : as per MRI. Continue w/ neuro checks. PT/OT initially recommending SNF, now PT recs "CIR as per wife's request". Repeat MRI  brain as per neuro  Multifocal pneumonia: possible hospital-acquired, immunocompromised state. No more fevers in at least 72 hours. 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 Continue fluid restriction and salt tablets.   Right lung nodule: s/p bronchoscopy w/ biopsy on 02/18/20, path showing cryptococcus species. Sclerotic changes of the right third fourth and fifth ribs. Follow-up with palliative care. Pulmon following and recs apprec  Recent pulmonary embolism and DVT: diagnosed on December 23, 2019. Eliquis on hold, continue on lovenox  Myasthenia gravis: continue prednisone taper (10->5 mg) on 3/17 as per ID. Monitor for MG exacerbation post steroid taper. 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 Recommend annual imaging followup by CTA or MRA  Depression: psychiatry consulted as per wife's request. Psych following and recs apprec   Generalized weakness/debility: PT/OT initially recommending SNF, now PT recs "CIR as per wife's request". BMI 19.21  - d/w PT - ok for him to walk with PT outside for fresh air in a hope to improve the mood   DVT prophylaxis: lovenox  Code Status: DNR Family Communication: I discussed pt's care w/ pt's wife, Duane Santos, and answered her questions. She would like to talk with CIR person and if possible transfer him there tomorrow if possible.  Disposition Plan:  ID is rec pt stay inpatient for another 1-2 days while on IV amp. Possible D/C tomorrow to CIR if wife is able to have satisfactory conversation with Caitlyn at [facility].  Consultants:   Pulmon  Palliative care  Psychiatry   Nephro  Neuro  ID   Procedures:    Antimicrobials:    Subjective: Pt c/o fatigue, sleepy. Overall feels sleepy. Appears sad/flat affect  Objective: Vitals:    02/22/20 0740 02/22/20 1553 02/23/20 0000 02/23/20 0750  BP: (!) 144/94 126/83 (!) 151/90 138/88  Pulse: (!) 101 99 (!) 107 (!) 108  Resp:   18 16  Temp: (!) 97.2 F (36.2 C)  98.7 F (37.1 C) 98.2 F (36.8 C)  TempSrc:   Oral   SpO2: 100% 97% 100% 98%  Weight:      Height:        Intake/Output Summary (Last 24 hours) at 02/23/2020 1433 Last data filed at 02/23/2020 1030 Gross per 24 hour  Intake 120 ml  Output --  Net 120 ml   Filed Weights   02/09/20 1656  Weight: 54 kg    Examination:  General exam: Appears calm and comfortable  Respiratory system: diminished breath sounds b/l. No rales, rhonchi Cardiovascular system: S1 & S2. No rubs, gallops or clicks.  Gastrointestinal system: Abdomen is nondistended, soft and nontender. normal bowel sounds heard.  Central nervous system: Alert and awake. Moves all 4 extremities  Psychiatry: Judgement and insight appear abnormal. Flat mood and affect    Data Reviewed: I have personally reviewed following labs and imaging studies  CBC: Recent Labs  Lab 02/19/20 0538 02/20/20 0522 02/21/20 0615 02/22/20 0401 02/23/20 0642  WBC 10.6* 9.4 8.8 7.4 9.2  HGB  11.9* 12.8* 12.5* 12.4* 13.9  HCT 37.6* 38.4* 37.8* 37.4* 40.7  MCV 102.7* 100.0 99.7 101.1* 99.5  PLT 214 232 242 236 409   Basic Metabolic Panel: Recent Labs  Lab 02/19/20 0538 02/20/20 0522 02/21/20 0615 02/22/20 0400 02/23/20 0642  NA 142 141 143 143 143  K 3.6 3.5 3.7 4.0 3.9  CL 105 104 105 107 106  CO2 _0 GLUCOSE 102* 106* 119* 99 98  BUN 18 20 29* 30* 26*  CREATININE 0.67 0.66 0.84 0.80 0.81  CALCIUM 8.7* 9.4 9.6 9.9 10.3  MG 2.4 2.3 2.7* 2.6* 2.6*   GFR: Estimated Creatinine Clearance: 63 mL/min (by C-G formula based on SCr of 0.81 mg/dL). Liver Function Tests: Recent Labs  Lab 02/23/20 0642  AST 33  ALT 57*  ALKPHOS 75  BILITOT 0.6  PROT 6.9  ALBUMIN 3.3*   No results for input(s): LIPASE, AMYLASE in the last 168 hours. No  results for input(s): AMMONIA in the last 168 hours. Coagulation Profile: Recent Labs  Lab 02/17/20 1512  INR 0.9   Cardiac Enzymes: No results for input(s): CKTOTAL, CKMB, CKMBINDEX, TROPONINI in the last 168 hours. BNP (last 3 results) No results for input(s): PROBNP in the last 8760 hours. HbA1C: No results for input(s): HGBA1C in the last 72 hours. CBG: Recent Labs  Lab 02/18/20 1556  GLUCAP 102*   Lipid Profile: No results for input(s): CHOL, HDL, LDLCALC, TRIG, CHOLHDL, LDLDIRECT in the last 72 hours. Thyroid Function Tests: No results for input(s): TSH, T4TOTAL, FREET4, T3FREE, THYROIDAB in the last 72 hours. Anemia Panel: No results for input(s): VITAMINB12, FOLATE, FERRITIN, TIBC, IRON, RETICCTPCT in the last 72 hours. Sepsis Labs: No results for input(s): PROCALCITON, LATICACIDVEN in the last 168 hours.  Recent Results (from the past 240 hour(s))  MRSA PCR Screening     Status: None   Collection Time: 02/13/20  3:03 PM   Specimen: Nasal Mucosa; Nasopharyngeal  Result Value Ref Range Status   MRSA by PCR NEGATIVE NEGATIVE Final    Comment:        The GeneXpert MRSA Assay (FDA approved for NASAL specimens only), is one component of a comprehensive MRSA colonization surveillance program. It is not intended to diagnose MRSA infection nor to guide or monitor treatment for MRSA infections. Performed at [facility], [address] [place], [place] [address]   Aspergillus Ag, BAL/Serum     Status: None   Collection Time: 02/16/20  6:17 AM   Specimen: Lung, Right  Result Value Ref Range Status   Aspergillus Ag, BAL/Serum 0.03 0.00 - 0.49 Index Final    Comment: (NOTE) Performed At: [facility] [address] [place], [place] [id number] Rush Farmer MD [id number] Performed At: [facility] [address] [place], [place] [id number] Katina Degree MDPhD [id number]   Culture, bal-quantitative     Status: None   Collection Time:  02/18/20 10:00 AM   Specimen: Bronchoalveolar Lavage  Result Value Ref Range Status   Specimen Description   Final    BRONCHIAL ALVEOLAR LAVAGE Performed at [facility], [address], [place], [place] [address]    Special Requests   Final    NONE Performed at [facility], [address] [place], [place] [address]    Gram Stain NO WBC SEEN NO ORGANISMS SEEN   Final   Culture   Final    NO GROWTH 2 DAYS Performed at [facility], [address] [place], [place] [address]    Report Status 02/20/2020 FINAL  Final  Culture, fungus without smear     Status: None (Preliminary result)   Collection Time: 02/18/20 10:02 AM   Specimen: Lung; Cerebrospinal Fluid  Result Value Ref Range Status   Specimen Description   Final    LUNG Performed at [facility], [address], [place], [place] [address]    Special Requests   Final    LLL Performed at [facility], [address] [place], [place] [address]    Culture   Final    NO FUNGUS ISOLATED AFTER 3 DAYS Performed at [facility], [place] [address] [place], [place] [address]    Report Status PENDING  Incomplete  Fungus Culture With Stain     Status: None (Preliminary result)   Collection Time: 02/18/20 10:02 AM   Specimen: Lung, Right  Result Value Ref Range Status   Fungus Stain Final report  Final    Comment: (NOTE) Performed At: [facility] [address] [place], [place] [id number] Rush Farmer MD [id number]    Fungus (Mycology) Culture PENDING  Incomplete   Fungal Source BRONCHIAL ALVEOLAR LAVAGE  Final    Comment: Performed at [facility], [address] [place], [place] [address]  Fungus Culture Result     Status: None   Collection Time: 02/18/20 10:02 AM  Result Value Ref Range Status   Result 1 Comment  Final    Comment: (NOTE) KOH/Calcofluor preparation:  no fungus observed. Performed At: [facility] [place], [place] [id number] Rush Farmer MD [id number]   CSF culture     Status: Abnormal (Preliminary result)   Collection Time: 02/18/20  2:51 PM   Specimen: CSF; Cerebrospinal Fluid  Result Value Ref Range Status   Specimen Description   Final    CSF Performed at [facility], [address] [place], [place] [address]    Special Requests   Final    NONE Performed at [facility], [place], [place] [address]    Gram Stain (A)  Final    YEAST CRITICAL RESULT CALLED TO, READ BACK BY AND VERIFIED WITH: SERENITY KIRKENDALL AT 1600 02/18/20.PMF WBC SEEN RED BLOOD CELLS PRESENT Performed at [facility], [address] [place], [place] [address]    Culture   Final    CULTURE REINCUBATED FOR BETTER GROWTH Performed at [facility], [place] [address] [place], [place] [address]    Report Status PENDING  Incomplete         Radiology Studies: DG Chest Port 1 View  Result Date: 02/23/2020 CLINICAL DATA:  Pulmonary disease.  History of lung cancer EXAM: PORTABLE CHEST 1 VIEW COMPARISON:  02/12/2020.  Chest CT 02/17/2020 FINDINGS: Left PICC line in place with the tip in the SVC. Heart is normal size. Nodule/mass in the superior segment of the right lower lobe noted as seen on recent CT. Interstitial prominence and vague airspace disease noted in the lungs bilaterally. Heart is normal size. No effusions. No acute bony abnormality. Old left rib fractures. IMPRESSION: Right lower lobe nodule/mass again noted as seen on recent CT. Vague airspace opacities and interstitial prominence within the lungs. Electronically Signed   By: Rolm Baptise M.D.   On: 02/23/2020 12:29        Scheduled Meds: . apixaban  5 mg Oral BID  . butamben-tetracaine-benzocaine  1 spray Topical Once  .  Chlorhexidine Gluconate Cloth  6 each Topical Daily  . dextrose  10 mL Intravenous Q24H  . dextrose  10 mL Intravenous Q24H  . feeding supplement (ENSURE ENLIVE)   237 mL Oral TID BM  . flucytosine  25 mg/kg Oral Q6H  . lidocaine (PF)  30 mL Other Once  . lidocaine  1 application Topical Once  . multivitamin with minerals  1 tablet Oral Daily  . [START ON 02/24/2020] predniSONE  5 mg Oral Q breakfast  . sodium chloride  500 mL Intravenous Q24H  . sodium chloride  500 mL Intravenous Q24H  . sodium chloride flush  10-40 mL Intracatheter Q12H  . sodium chloride  1 g Oral TID WC  . thiamine  100 mg Oral Daily   Continuous Infusions: . amphotericin  B  Liposome (AMBISOME) ADULT IV 200 mg (02/22/20 2250)     LOS: 12 days    Time spent: 30 mins     Natnael Biederman Manuella Ghazi, MD [facility] Pager [phone]  If 7PM-7AM, please contact night-coverage [email or web address] 02/23/2020, 2:33 PM

## 2020-02-23 NOTE — Progress Notes (Signed)
Physical Therapy Treatment Patient Details Name: Duane Santos MRN: [id number] DOB: 1946/05/26 Today's Date: 02/23/2020    History of Present Illness Pt. presented to ER secondary to AMS, progressive weakness and repeated falls; admitted for management of acute metabolic encephalopathy.  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 PMH significant for MG, covid-19 infection (1/21) and newly-diagnosed R lung cancer.  s/p lung biopsy, pending LP for continued work up.    PT Comments    Pt is making gradual progress towards goals with increased functional independence noted this date. Able to perform mobility with 1 assist with 2nd person for safety due to cognition. Pt agreeable to therapy and reports he wants to go home very badly as he is tired of staying here. Stair training performed to simulate enter/exit home environment. Safe technique with cues for sequencing and hands on assist. HR at 124 pre exertion, 141 with stairs, and 129 post exertion. Pt also practiced WC "walking" for B LE strengthening. Becomes distracted quickly. Will continue to progress as able.   Follow Up Recommendations  Home health PT;Supervision/Assistance - 24 hour     Equipment Recommendations  Rolling walker with 5" wheels;3in1 (PT)    Recommendations for Other Services       Precautions / Restrictions Precautions Precautions: Fall Restrictions Weight Bearing Restrictions: No    Mobility  Bed Mobility               General bed mobility comments: Patient sitting in recliner upon entry  Transfers Overall transfer level: Needs assistance Equipment used: Rolling walker (2 wheeled) Transfers: Sit to/from Stand Sit to Stand: Min guard         General transfer comment: safe technique with RW. Improved static balance this date. Only requiring 1 assist at this time.  Ambulation/Gait Ambulation/Gait assistance: Min assist Gait Distance (Feet): 40  Feet Assistive device: Rolling walker (2 wheeled) Gait Pattern/deviations: Step-through pattern     General Gait Details: ambulated with improved upright posture, still with shuffling gait. Gait limited this date due to fatigue.   Stairs Stairs: Yes Stairs assistance: Min assist;+2 safety/equipment Stair Management: Two rails;Alternating pattern Number of Stairs: 8 General stair comments: navigate stairs twice with cues for sequencing demonstrating reciprocal gait pattern. Cues for placing whole foot on step and needs manual assist for turning at top to decend. Slow technique. HR elevated to 141bpm with stair training. Increased SOB with exertion   Information systems manager mobility: Yes Wheelchair propulsion: Both lower extermities Wheelchair parts: Needs assistance Distance: 60 Wheelchair Assistance Details (indicate cue type and reason): encouraged to use B LE however with decreased attention to task, frequently stops when he is distracted with external stimuli in hallway. Needs physical assist for steering and avoiding obstacles.  Modified Rankin (Stroke Patients Only)       Balance Overall balance assessment: Needs assistance Sitting-balance support: No upper extremity supported;Feet supported Sitting balance-Leahy Scale: Good Sitting balance - Comments: safe technique   Standing balance support: Bilateral upper extremity supported Standing balance-Leahy Scale: Fair                              Cognition Arousal/Alertness: Awake/alert Behavior During Therapy: Flat affect Overall Cognitive Status: Within Functional Limits for tasks assessed  General Comments: delayed processing, increased time to respond to questions      Exercises Other Exercises Other Exercises: Encouraged patient to participate in self feeding, however, patient declining. Other Exercises: Encouraged patient to  participate in grooming tasks at sink or recliner but patient declining Other Exercises: Performed sit to stands at recliner level and reaching out of BOS in standing to improve safety during toileting and dressing.  CGA.    General Comments        Pertinent Vitals/Pain Pain Assessment: No/denies pain Pain Score: 2  Pain Location: Low back Pain Descriptors / Indicators: Discomfort Pain Intervention(s): Monitored during session;Repositioned;Limited activity within patient's tolerance    Home Living                      Prior Function            PT Goals (current goals can now be found in the care plan section) Acute Rehab PT Goals Patient Stated Goal: agreeable to session PT Goal Formulation: With patient Time For Goal Achievement: 02/25/20 Potential to Achieve Goals: Fair Progress towards PT goals: Progressing toward goals    Frequency    Min 2X/week      PT Plan Current plan remains appropriate    Co-evaluation              AM-PAC PT "6 Clicks" Mobility   Outcome Measure  Help needed turning from your back to your side while in a flat bed without using bedrails?: A Little Help needed moving from lying on your back to sitting on the side of a flat bed without using bedrails?: A Little Help needed moving to and from a bed to a chair (including a wheelchair)?: A Little Help needed standing up from a chair using your arms (e.g., wheelchair or bedside chair)?: A Little Help needed to walk in hospital room?: A Little Help needed climbing 3-5 steps with a railing? : A Little 6 Click Score: 18    End of Session Equipment Utilized During Treatment: Gait belt Activity Tolerance: Patient tolerated treatment well;Patient limited by fatigue Patient left: in chair;with call bell/phone within reach;with chair alarm set Nurse Communication: Mobility status PT Visit Diagnosis: Muscle weakness (generalized) (M62.81);Other abnormalities of gait and mobility  (R26.89);Unsteadiness on feet (R26.81);Other symptoms and signs involving the nervous system (R29.898);Repeated falls (R29.6)     Time: 1308-6578 PT Time Calculation (min) (ACUTE ONLY): 28 min  Charges:  $Gait Training: 23-37 mins                     Greggory Stallion, [place], DPT [phone]    Duane Santos 02/23/2020, 11:20 AM

## 2020-02-23 NOTE — Progress Notes (Signed)
Duane Santos INFECTIOUS DISEASE PROGRESS NOTE Date of Admission:  02/09/2020     ID: Duane Santos is a 74 y.o. male with AMS Principal Problem:   Acute confusional state Active Problems:   Multifocal pneumonia   Myasthenia gravis ([place])   Right lower lobe lung mass   Generalized weakness   Hyponatremia   History of COVID-19 pneumonia with hypoxia   Acute metabolic encephalopathy   Chronic respiratory failure with hypoxia [facility])   Palliative care encounter   Subjective: Remains  Sleepy but got oob to chair with help today for most of the morning. Wife at bedside and says yesterday evening he seemed clearer.  ROS  Unable to obtain  Medications:  Antibiotics Given (last 72 hours)    Date/Time Action Medication Dose Rate   02/20/20 1750 New Bag/Given   piperacillin-tazobactam (ZOSYN) IVPB 3.375 g 3.375 g 12.5 mL/hr   02/21/20 0128 New Bag/Given   piperacillin-tazobactam (ZOSYN) IVPB 3.375 g 3.375 g 12.5 mL/hr   02/21/20 1023 New Bag/Given   piperacillin-tazobactam (ZOSYN) IVPB 3.375 g 3.375 g 12.5 mL/hr   02/21/20 1806 New Bag/Given   piperacillin-tazobactam (ZOSYN) IVPB 3.375 g 3.375 g 12.5 mL/hr     . apixaban  5 mg Oral BID  . butamben-tetracaine-benzocaine  1 spray Topical Once  . Chlorhexidine Gluconate Cloth  6 each Topical Daily  . dextrose  10 mL Intravenous Q24H  . dextrose  10 mL Intravenous Q24H  . feeding supplement (ENSURE ENLIVE)  237 mL Oral TID BM  . flucytosine  25 mg/kg Oral Q6H  . lidocaine (PF)  30 mL Other Once  . lidocaine  1 application Topical Once  . multivitamin with minerals  1 tablet Oral Daily  . [START ON 02/24/2020] predniSONE  5 mg Oral Q breakfast  . sodium chloride  500 mL Intravenous Q24H  . sodium chloride  500 mL Intravenous Q24H  . sodium chloride flush  10-40 mL Intracatheter Q12H  . sodium chloride  1 g Oral TID WC  . thiamine  100 mg Oral Daily    Objective: Vital signs in last 24 hours: Temp:  [98.2 F (36.8 C)-98.7 F  (37.1 C)] 98.2 F (36.8 C) (03/17 0750) Pulse Rate:  [99-108] 108 (03/17 0750) Resp:  [16-18] 16 (03/17 0750) BP: (126-151)/(83-90) 138/88 (03/17 0750) SpO2:  [97 %-100 %] 98 % (03/17 0750) Constitutional: He is thin, on O2,confused and unable to give a history HENT: anicteric Mouth/Throat: Oropharynx is clear and moist. Cardiovascular: Normal rate, regular rhythm and normal heart sounds. Exam reveals no gallop and no friction rub. No murmur heard.  Pulmonary/Chest: rhonchi bil Abdominal: Soft. Bowel sounds are normal. He exhibits no distension. There is no tenderness.  Lymphadenopathy:  He has no cervical adenopathy.  Neurological: able to move all 4, confused but trying to think of answers when questions asked. Very slow mentation Skin: Skin is warm and dry. No rash noted. No erythema.  Psychiatric: confused  Lab Results Recent Labs    02/21/20 0615 02/22/20 0400 02/22/20 0401 02/23/20 0642  WBC   < >  --  7.4 9.2  HGB   < >  --  12.4* 13.9  HCT   < >  --  37.4* 40.7  NA  --  143  --  143  K  --  4.0  --  3.9  CL  --  107  --  106  CO2  --  30  --  27  BUN  --  30*  --  26*  CREATININE  --  0.80  --  0.81   < > = values in this interval not displayed.    Microbiology: Results for orders placed or performed during the hospital encounter of 02/09/20  CULTURE, BLOOD (ROUTINE X 2) w Reflex to ID Panel     Status: None   Collection Time: 02/12/20  3:59 PM   Specimen: BLOOD  Result Value Ref Range Status   Specimen Description BLOOD RAC  Final   Special Requests   Final    BOTTLES DRAWN AEROBIC AND ANAEROBIC Blood Culture results may not be optimal due to an excessive volume of blood received in culture bottles   Culture   Final    NO GROWTH 5 DAYS Performed at [facility], [address] [place], [place] [address]    Report Status 02/17/2020 FINAL  Final  CULTURE, BLOOD (ROUTINE X 2) w Reflex to ID Panel     Status: None   Collection Time: 02/12/20  4:07  PM   Specimen: BLOOD RIGHT HAND  Result Value Ref Range Status   Specimen Description BLOOD RIGHT HAND [facility]  Final   Special Requests   Final    BOTTLES DRAWN AEROBIC AND ANAEROBIC Blood Culture adequate volume   Culture   Final    NO GROWTH 5 DAYS Performed at [facility], [address] [place], [place] [address]    Report Status 02/17/2020 FINAL  Final  MRSA PCR Screening     Status: None   Collection Time: 02/13/20  3:03 PM   Specimen: Nasal Mucosa; Nasopharyngeal  Result Value Ref Range Status   MRSA by PCR NEGATIVE NEGATIVE Final    Comment:        The GeneXpert MRSA Assay (FDA approved for NASAL specimens only), is one component of a comprehensive MRSA colonization surveillance program. It is not intended to diagnose MRSA infection nor to guide or monitor treatment for MRSA infections. Performed at [facility], [address] [place], [place] [address]   Aspergillus Ag, BAL/Serum     Status: None   Collection Time: 02/16/20  6:17 AM   Specimen: Lung, Right  Result Value Ref Range Status   Aspergillus Ag, BAL/Serum 0.03 0.00 - 0.49 Index Final    Comment: (NOTE) Performed At: [facility] [address] [place], [place] [id number] Rush Farmer MD OZ:[id number] Performed At: [facility] [address] [place], [place] [id number] Katina Degree MDPhD KG:[id number]   Culture, bal-quantitative     Status: None   Collection Time: 02/18/20 10:00 AM   Specimen: Bronchoalveolar Lavage  Result Value Ref Range Status   Specimen Description   Final    BRONCHIAL ALVEOLAR LAVAGE Performed at [facility], [address] [place], [place] [address]    Special Requests   Final    NONE Performed at [facility], [address] [place], [place] [address]    Gram Stain NO WBC SEEN NO ORGANISMS SEEN   Final   Culture   Final    NO GROWTH 2 DAYS Performed at [facility], [place] [address], [place], [place]  [address]    Report Status 02/20/2020 FINAL  Final  Culture, fungus without smear     Status: None (Preliminary result)   Collection Time: 02/18/20 10:02 AM   Specimen: Lung; Cerebrospinal Fluid  Result Value Ref Range Status   Specimen Description   Final    LUNG Performed at  [facility], [address], [place], [place] [address]    Special Requests   Final    LLL Performed at [facility], [address], [place], [place] [address]    Culture   Final    NO FUNGUS ISOLATED AFTER 3 DAYS Performed at [facility], [place] [address] [place], [place] [address]    Report Status PENDING  Incomplete  Fungus Culture With Stain     Status: None (Preliminary result)   Collection Time: 02/18/20 10:02 AM   Specimen: Lung, Right  Result Value Ref Range Status   Fungus Stain Final report  Final    Comment: (NOTE) Performed At: [facility] [address] [place], [place] [id number] Rush Farmer MD HX:[id number]    Fungus (Mycology) Culture PENDING  Incomplete   Fungal Source BRONCHIAL ALVEOLAR LAVAGE  Final    Comment: Performed at [facility], [address] [place], [place] [address]  Fungus Culture Result     Status: None   Collection Time: 02/18/20 10:02 AM  Result Value Ref Range Status   Result 1 Comment  Final    Comment: (NOTE) KOH/Calcofluor preparation:  no fungus observed. Performed At: [facility] [place], [place] [id number] Rush Farmer MD [id number]:[id number]   CSF culture     Status: Abnormal (Preliminary result)   Collection Time: 02/18/20  2:51 PM   Specimen: CSF; Cerebrospinal Fluid  Result Value Ref Range Status   Specimen Description   Final    CSF Performed at [facility], [address] [place], [place] [address]    Special Requests   Final    NONE Performed at [facility], [place], [place] [address]    Gram Stain (A)  Final    YEAST CRITICAL RESULT  CALLED TO, READ BACK BY AND VERIFIED WITH: SERENITY KIRKENDALL AT 1600 02/18/20.PMF WBC SEEN RED BLOOD CELLS PRESENT Performed at [facility], [address] [place], [place] [address]    Culture   Final    CULTURE REINCUBATED FOR BETTER GROWTH Performed at [facility], [place] [address], [place], [place] [address]    Report Status PENDING  Incomplete    Studies/Results: DG Chest Port 1 View  Result Date: 02/23/2020 CLINICAL DATA:  Pulmonary disease.  History of lung cancer EXAM: PORTABLE CHEST 1 VIEW COMPARISON:  02/12/2020.  Chest CT 02/17/2020 FINDINGS: Left PICC line in place with the tip in the SVC. Heart is normal size. Nodule/mass in the superior segment of the right lower lobe noted as seen on recent CT. Interstitial prominence and vague airspace disease noted in the lungs bilaterally. Heart is normal size. No effusions. No acute bony abnormality. Old left rib fractures. IMPRESSION: Right lower lobe nodule/mass again noted as seen on recent CT. Vague airspace opacities and interstitial prominence within the lungs. Electronically Signed   By: Rolm Baptise M.D.   On: 02/23/2020 12:29    Assessment/Plan: BRITON SELLMAN is a 74 y.o. male with longstanding MG on prednisone long term as well as cellcept previously wth recent COVID (received decadron and tocilizamab) dced on home O2, recent dx of lung mass PET +, PE, awaiting bronch for bxp now with 2 weeks confusion, and now fevers off and on since admission. So far MRI brain with possible cerebellar subacute infarct, met or artifact, EEG non diagnostic, BCX neg, echo neg.  The differential dx for his fevers, confusion and pneumonia are broad in  this immunocompromised patient and include typical etiologies of viral encephalitis as well as fungal causes. CNS mets or paraneoplastic encephalitis also possible. He is not septic and cultures negative, unlikely to be pyogenic bacterial infection. 3/11- no fevers, wbc 11. Remains  confused.   CRAG very high on serum  3/12- bronch with findings of crypto on cytopath 3/15. No fevers. LP with markedly elevated protein. Op was only 9.  Yeast noted on gram stain. Remains on ampho and 82f. 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  Recommendations Cont amphotericin and 5Fc. Close following of safety labs, lytes, renal and cell counts Will need 2 weeks IV at least. I suspect he will actually need 4-6 weeks of induction treatment. He should remain inpatient or in an inpatient rehab to receive this until he has clinical response. All his path, CSF, bronch etc show Crypto.  [facility] agrees need to decrease steroids and wean off if possible.  On 3/16 we decreased pred from 15 mg to  10 mg. Can wean as possible given risk of adrenal insuffiencey. Monitor for signs of MG flare but most important to limit immunosuppression. I discussed extensively today with his wife that e would be best served at an inpatient rehab faciltiy where he can continue to get aggressive medical care and aggressive Rehab care. He would not be a candidate for SNF or lower level of care however.  Thank you very much for the consult. Will follow with you.  DLeonel Ramsay  02/23/2020, 12:54 PM

## 2020-02-23 NOTE — Progress Notes (Addendum)
Pharmacy - Liposomal Amphotericin Monitoring  79 YOM with myasthenia gravis on prednisone/mycophenolate.  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  Started on amphotericin - liposomal and flucytosine evening on 3/11. He had LP and BAL performed 3/12 - CSF cx with yeast .  Lung cx unrevealing  Antibiotic history this admission Cefepime/Vancomycin 3/6>3/8 Zosyn 3/8 >> 3/15   Amphotericin B Liposomal  3/11 >> (today day 7)  Flucytosine    3/11 >> (today day 7)  Labs 02/23/2020 - K = 3.9 - Mg = 2.6 - SCr = 0.81  - AST = 33 - ALT = 57  Plan:   Continue Ampohtericin 284m (3.761mkg) IV q24h  Normal saline 50067molus pre and post dose with D5W 36m73mushes in between boluses  Flucytosine 1250mg50mmg/23mPO q6h    Check daily BMP and Mg and replete K+ and Mg daily as needed as both amphotericin and flucytosine can cause hypokalemia.  No additional electrolyte replacement needed at this time.   Monitor renal function   Monitor CBC and LFTs periodically (flucytosine can cause hepatoxicity and bone marrow suppression).    CharleLu DuffelmD, BCPS Clinical Pharmacist 02/23/2020 7:22 AM

## 2020-02-23 NOTE — Plan of Care (Signed)
  Problem: Clinical Measurements: Goal: Ability to maintain clinical measurements within normal limits will improve Outcome: Progressing Goal: Will remain free from infection Outcome: Progressing Goal: Diagnostic test results will improve Outcome: Progressing Goal: Respiratory complications will improve Outcome: Progressing   Problem: Activity: Goal: Risk for activity intolerance will decrease Outcome: Progressing   Problem: Coping: Goal: Level of anxiety will decrease Outcome: Progressing   Problem: Pain Managment: Goal: General experience of comfort will improve Outcome: Progressing   Problem: Safety: Goal: Ability to remain free from injury will improve Outcome: Progressing

## 2020-02-23 NOTE — Progress Notes (Signed)
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 motion. Left ventricular diastolic function could not be evaluated.  2. Right ventricular systolic function is normal. Duane right ventricular size is normal. Tricuspid regurgitation signal is inadequate for assessing PA pressure.  3. Duane mitral valve is grossly normal. No evidence of mitral valve regurgitation.  4. Duane aortic valve was not well visualized. Aortic valve regurgitation not well-assessed. Aortic stenosis was not well-assessed. FINDINGS  Left Ventricle: Left ventricular ejection fraction, by estimation, is 65 to 70%. Duane left ventricle has normal function. Left ventricular endocardial border not optimally defined to evaluate regional wall motion. Duane left ventricular internal cavity size was normal in size. There is no left ventricular hypertrophy. Left ventricular diastolic function could not be evaluated. Right Ventricle: Duane right ventricular size is normal. No increase in right ventricular wall thickness. Right ventricular systolic function is normal. Tricuspid regurgitation signal is inadequate for assessing PA pressure. Left Atrium: Left atrial size was not well visualized. Right Atrium: Right atrial size was not well visualized. Pericardium: Duane pericardium was not well visualized. Mitral  Valve: Duane mitral valve is grossly normal. No evidence of mitral valve regurgitation. Tricuspid Valve: Duane tricuspid valve is grossly normal. Tricuspid valve regurgitation is trivial. Aortic Valve: Duane aortic valve was not well visualized. Aortic valve regurgitation not well-assessed. Aortic stenosis was not well-assessed. Pulmonic Valve: Duane pulmonic valve was not well visualized. Pulmonic valve regurgitation is not visualized. No evidence of pulmonic stenosis. Aorta: Duane aortic root is normal in size and structure. Pulmonary Artery: Duane pulmonary artery is not well seen. Venous:  Duane inferior vena cava was not well visualized. IAS/Shunts: Duane interatrial septum was not well visualized. Agitated saline contrast was given intravenously to evaluate for intracardiac shunting.  LEFT VENTRICLE PLAX 2D LVIDd:         3.33 cm LVIDs:         2.22 cm LV PW:         0.95 cm LV IVS:        0.83 cm LVOT diam:     2.00 cm LVOT Area:     3.14 cm  LEFT ATRIUM         Index LA diam:    1.80 cm 1.12 cm/m                        PULMONIC VALVE AORTA                 RVOT Peak grad: 3 mmHg Ao Root diam: 3.10 cm   SHUNTS Systemic Diam: 2.00 cm Nelva Bush MD Electronically signed by Nelva Bush MD Signature Date/Time: 02/11/2020/4:44:22 PM    Final    [place] EKG SITE RITE  Result Date: 02/18/2020 If Site Rite image not attached, placement could not be confirmed due to current cardiac rhythm.  DG FL GUIDED LUMBAR PUNCTURE  Result Date: 02/18/2020 CLINICAL DATA:  Confusion, cryptococcal antigen EXAM: DIAGNOSTIC LUMBAR PUNCTURE UNDER FLUOROSCOPIC GUIDANCE FLUOROSCOPY TIME:  Fluoroscopy Time:  00:30 Number of Acquired Spot Images: 6 PROCEDURE: Informed consent was obtained from Duane patient prior to Duane procedure, including potential complications of headache, allergy, and pain. With Duane patient prone, Duane lower back was prepped with Betadine. 1% Lidocaine was used for local anesthesia. Lumbar puncture was performed at Duane L3-L4  level using a 22 gauge needle with return of clear CSF with an opening pressure of less than 9 cm water. 8 ml of CSF were obtained for laboratory studies. Duane patient tolerated Duane procedure well and there were no apparent complications. IMPRESSION: Successful fluoroscopic lumbar puncture. 8 mL clear CSF obtained and submitted to Duane laboratory. Electronically Signed   By: Eddie Candle M.D.   On: 02/18/2020 14:57   CT Super D Chest W Contrast  Result Date: 02/17/2020 CLINICAL DATA:  74 year old male with altered mental status, history of multifocal pneumonia or in Duane setting of myasthenia gravis. EXAM: CT CHEST WITH CONTRAST TECHNIQUE: Multidetector CT imaging of Duane chest was performed using thin slice collimation for electromagnetic bronchoscopy planning purposes, with intravenous contrast. CONTRAST:  70mL OMNIPAQUE IOHEXOL 300 MG/ML  SOLN COMPARISON:  Prior PET-CT of 01/24/2020 FINDINGS: Cardiovascular: Ascending dilated to 3.5 cm. Scattered atherosclerosis summer is calcification. Heart size is normal without pericardial effusion. Scattered coronary artery calcification. Central pulmonary vasculature is normal. Mediastinum/Nodes: Small lymph nodes throughout Duane mediastinum unchanged since recent imaging studies largest 7 mm along Duane right paratracheal chain. Other smaller nodes track towards Duane thoracic inlet. No thoracic inlet adenopathy. No axillary lymphadenopathy. No nodal enlargement beyond a cm in Duane bilateral hila. Correlate with recent PET exam which shows right hilar FDG uptake. Lungs/Pleura: Signs of ground-glass, diminished in general since Duane previous exam now with more peripheral predominance. Right lower lobe nodule found to be FDG avid in Duane medial right lung base (image 242, series 5) 1.6 by 0.7 cm not changed accounting for Duane degree of respiratory motion and was present on Duane CT angiography there was performed January of 2021 though not well seen on this study due to Duane motion  artifact. Spiculated nodule in Duane superior segment of Duane right lower lobe with some central area of added density suggesting some calcification. This measures 2.1 by 2.0 cm, showing a similar appearance. Development of some bronchiectatic changes since Duane prior study in Duane left upper lobe, lingula and left lower lobe. Septal thickening is more pronounced also with dilation of left upper lobe bronchi. Process shows a more apical and peripheral than basilar predominance and is occurred in Duane short interval. No consolidation. No pleural effusion. Upper Abdomen: Incidental imaging of upper abdominal contents is unremarkable. Musculoskeletal: Unchanged appearance of sclerotic foci and right-sided ribs. Signs of prior fracture to left-sided ribs midthoracic compression fractures without interval change. IMPRESSION: 1. Signs of bronchiectatic changes and septal thickening more so than Duane ground-glass that was seen on Duane previous exam. Bronchiectasis was present previously and may be slightly worse on today's exam though Duane lung parenchyma is better evaluated on today's study. Post inflammatory fibrosis is considered, by report in Duane medical record Duane patient has a history of COVID infection. 2. Spiculated nodule superior segment right lower lobe with central area of added density suggesting some calcification, wall finding remains concerning for bronchogenic neoplasm Duane possibility of sequela of granulomatous infection could also be considered. Biopsy is suggested. 3. Correlate with recent PET exam which shows right hilar FDG uptake. 4. Stable appearance of sclerotic foci in Duane right-sided ribs. 5. 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 Recommend annual imaging followup by CTA or MRA. This recommendation follows 2010 ACCF/AHA/AATS/ACR/ASA/SCA/SCAI/SIR/STS/SVM Guidelines for Duane Diagnosis and Management of Patients with Thoracic Aortic Disease. Circulation.2010; 121: G315-V761. Aortic aneurysm NOS  (ICD10-I71.9) 6. Pulmonary embolism seen on prior exam is not demonstrated on Duane current exam. 7. Scattered coronary artery calcification. Electronically Signed   By: Zetta Bills M.D.   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 -he will need to be off Eliquis for 7d prior to procedure -he wants to be DNR -Palliative evaluation is inprogress    Disseminated cryptococcal meningoencephalitis  -Infectious disease on case-appreciate input-following recommendations  -Crypto antigen elevated - LP results noted  - currently on AmphoB  - decreasing steroids as per neuro  COVID19 infection - patient is no longer hypoxemic and is able to have saturations over 90% while at rest without using supplemental oxygen -we will order O2 concentrator   Severe hyponatremia   - repleted    - likely due to overwhelming infection  Altered mental status  - likely due to hyponatremia and septic encephalopathy  -continue current plan   Myesthenia Gravis   -decreasing prednisone as per primary neurologist5  - s/p neuro evaluation - possible mestinon therapy soon -monitor for MG exacerbation post steroid taper    Pulmonary embolism - patient on eliquis- will hold for now. He is on Lovenox now, we will stop this on Wednesday 02/16/20 -Have stopped Eliquis prior to hospitalization in preparation for ENB/EBUS- have restarted eliquis      Thank you for allowing me to participate in Duane care of this patient.   Patient/Family are satisfied with care plan and all questions have been answered.  This document was prepared using Dragon voice recognition software and may include unintentional dictation errors.     Ottie Glazier, M.D.  Division of Pulmonary &  Critical  [place]

## 2020-02-23 NOTE — Progress Notes (Signed)
Occupational Therapy Treatment Patient Details Name: Duane Santos MRN: [id number] DOB: 1946-04-08 Today's Date: 02/23/2020    History of present illness Pt. presented to ER secondary to AMS, progressive weakness and repeated falls; admitted for management of acute metabolic encephalopathy.  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 PMH significant for MG, covid-19 infection (1/21) and newly-diagnosed R lung cancer.  s/p lung biopsy, pending LP for continued work up.   OT comments  Patient seen this AM for therapy session and agreeable to therapy.  Patient agreeable, however, demonstrating decreased motivation for therapeutic tasks. Patient not agreeable to eat breakfast despite encouragement or perform any grooming tasks.  Alerted nurse about behavior.  Performed sit to stands from recliner and focusing on reaching out of BOS in standing to improve safety and independence with toileting and dressing tasks.  CGA needed for safety and use of RW for UE support.  Demonstrating improved cognition and ability to follow through with multistep directions effectively.  Continues to demonstrate some confusion with memory and orientation to situation.  Patient would continue to benefit from skilled OT to address performance components outlined in evaluation.  Based on today's performance, recommending upgrade d/c plan to [facility] OT with 24/7 supervision.  Patient demonstrating improved safety, strength and cognition and would benefit from familiar environment.     Follow Up Recommendations  Home health OT;Supervision/Assistance - 24 hour(Patient wife wishes to take him to CIR.  However, upon progression with therapy, finding it appropriate for [facility] therapies and 24/7 assistance/SPV)    Equipment Recommendations       Recommendations for Other Services      Precautions / Restrictions Precautions Precautions: Fall Restrictions Weight Bearing Restrictions: No        Mobility Bed Mobility               General bed mobility comments: Patient sitting in recliner upon entry  Transfers Overall transfer level: Needs assistance   Transfers: Sit to/from Stand Sit to Stand: Min guard         General transfer comment: Completed sit to stand transfers from recliner level with improved body mechanics, sequencing and safety    Balance                                           ADL either performed or assessed with clinical judgement   ADL Overall ADL's : Needs assistance/impaired Eating/Feeding: Supervision/ safety;Sitting Eating/Feeding Details (indicate cue type and reason): Patient refusing to eat this date, stating he just wasn't "Feeling it today".   Grooming Details (indicate cue type and reason): Patient refused brushing teeth this date.                               General ADL Comments: Patient demonstrating lack of motivation this AM to participate.     Vision Patient Visual Report: No change from baseline     Perception     Praxis      Cognition Arousal/Alertness: Awake/alert Behavior During Therapy: WFL for tasks assessed/performed(Poor motivation, somewhat indifferent and dysphoric) Overall Cognitive Status: Within Functional Limits for tasks assessed  General Comments: Patient demonstrating improved appropriate conversation and follow up to therapist questions        Exercises Other Exercises Other Exercises: Encouraged patient to participate in self feeding, however, patient declining. Other Exercises: Encouraged patient to participate in grooming tasks at sink or recliner but patient declining Other Exercises: Performed sit to stands at recliner level and reaching out of BOS in standing to improve safety during toileting and dressing.  CGA.   Shoulder Instructions       General Comments      Pertinent Vitals/ Pain       Pain  Assessment: 0-10 Pain Score: 2  Pain Location: Low back Pain Descriptors / Indicators: Discomfort Pain Intervention(s): Monitored during session;Repositioned;Limited activity within patient's tolerance  Home Living                                          Prior Functioning/Environment              Frequency  Min 2X/week        Progress Toward Goals  OT Goals(current goals can now be found in the care plan section)  Progress towards OT goals: Progressing toward goals     Plan Discharge plan needs to be updated;Frequency remains appropriate    Co-evaluation                 AM-PAC OT "6 Clicks" Daily Activity     Outcome Measure   Help from another person eating meals?: A Little Help from another person taking care of personal grooming?: A Little Help from another person toileting, which includes using toliet, bedpan, or urinal?: A Little Help from another person bathing (including washing, rinsing, drying)?: A Lot Help from another person to put on and taking off regular upper body clothing?: A Little Help from another person to put on and taking off regular lower body clothing?: A Lot 6 Click Score: 16    End of Session Equipment Utilized During Treatment: Rolling walker  OT Visit Diagnosis: Muscle weakness (generalized) (M62.81);Unsteadiness on feet (R26.81)   Activity Tolerance Other (comment);Patient tolerated treatment well(Limited motivation)   Patient Left in chair;with call bell/phone within reach;with chair alarm set   Nurse Communication Other (comment)(Spoke to nurse regarding lack of appetite and dysphoric mood)        Time: 7494-4967 OT Time Calculation (min): 16 min  Charges: OT General Charges $OT Visit: 1 Visit OT Treatments $Self Care/Home Management : 8-22 mins  Baldomero Lamy, MS, OTR/L 02/23/20, 10:17 AM

## 2020-02-23 NOTE — TOC Progression Note (Addendum)
Transition of Care [facility]) - Progression Note    Patient Details  Name: Duane Santos MRN: [id number] Date of Birth: [birth year]  [facility] CM/SW Contact  Steel Kerney, Gardiner Rhyme, LCSW Phone Number: 02/23/2020, 12:30 PM  Clinical Narrative:   Wife is here and have asked both Dr Manuella Ghazi and Dr Ola Spurr to touch base with her and update. Need to find out if pt needs to stay here versus going to CIR. Wife is here until 1;30 pm. Continue to work on discharge plan  1:35 Pm Wife has spoken with MD's and feel going to CIR is best place for due to can be medically managed and can get intensive therapies. Caitlyn-liaison aware and will follow up with   Expected Discharge Plan: [place] Barriers to Discharge: Continued Medical Work up  Expected Discharge Plan and Services Expected Discharge Plan: [place] In-house Referral: Clinical Social Work   Post Acute Care Choice: Home Health, Durable Medical Equipment Living arrangements for the past 2 months: Single Family Home Expected Discharge Date: 02/15/20                                     Social Determinants of Health (SDOH) Interventions    Readmission Risk Interventions Readmission Risk Prevention Plan 12/30/2019  Transportation Screening Complete  Home Care Screening Complete  Medication Review (RN CM) Complete  Some recent data might be hidden

## 2020-02-23 NOTE — Progress Notes (Signed)
Inpatient Rehab Admissions Coordinator:   Spoke with pt's wife over the phone.  She continues to be on board for CIR level rehab.  Note MD states 1-2 more days of acute, possibly ready for admission Thursday/Friday pending bed availability and clearance by CIR medical director.    Shann Medal, PT, DPT Admissions Coordinator [phone] 02/23/20  3:00 PM

## 2020-02-24 ENCOUNTER — Other Ambulatory Visit: Payer: Self-pay

## 2020-02-24 ENCOUNTER — Encounter: Payer: Self-pay | Admitting: Internal Medicine

## 2020-02-24 ENCOUNTER — Inpatient Hospital Stay (HOSPITAL_COMMUNITY)
Admission: RE | Admit: 2020-02-24 | Discharge: 2020-03-15 | DRG: 945 | Disposition: A | Discharge status: Home Health | Payer: Medicare HMO | Source: Other Acute Inpatient Hospital | Attending: Physical Medicine & Rehabilitation | Admitting: Physical Medicine & Rehabilitation

## 2020-02-24 DIAGNOSIS — R7401 Elevation of levels of liver transaminase levels: Secondary | ICD-10-CM | POA: Diagnosis present

## 2020-02-24 DIAGNOSIS — G7000 Myasthenia gravis without (acute) exacerbation: Secondary | ICD-10-CM | POA: Diagnosis present

## 2020-02-24 DIAGNOSIS — R5381 Other malaise: Secondary | ICD-10-CM | POA: Diagnosis present

## 2020-02-24 DIAGNOSIS — N3944 Nocturnal enuresis: Secondary | ICD-10-CM

## 2020-02-24 DIAGNOSIS — E876 Hypokalemia: Secondary | ICD-10-CM | POA: Diagnosis present

## 2020-02-24 DIAGNOSIS — Z8701 Personal history of pneumonia (recurrent): Secondary | ICD-10-CM

## 2020-02-24 DIAGNOSIS — D72829 Elevated white blood cell count, unspecified: Secondary | ICD-10-CM | POA: Diagnosis not present

## 2020-02-24 DIAGNOSIS — Z681 Body mass index (BMI) 19 or less, adult: Secondary | ICD-10-CM

## 2020-02-24 DIAGNOSIS — Z8616 Personal history of COVID-19: Secondary | ICD-10-CM | POA: Diagnosis present

## 2020-02-24 DIAGNOSIS — R531 Weakness: Secondary | ICD-10-CM | POA: Diagnosis not present

## 2020-02-24 DIAGNOSIS — F419 Anxiety disorder, unspecified: Secondary | ICD-10-CM | POA: Diagnosis not present

## 2020-02-24 DIAGNOSIS — R0902 Hypoxemia: Secondary | ICD-10-CM | POA: Diagnosis present

## 2020-02-24 DIAGNOSIS — D539 Nutritional anemia, unspecified: Secondary | ICD-10-CM | POA: Diagnosis not present

## 2020-02-24 DIAGNOSIS — E43 Unspecified severe protein-calorie malnutrition: Secondary | ICD-10-CM | POA: Insufficient documentation

## 2020-02-24 DIAGNOSIS — G3184 Mild cognitive impairment, so stated: Secondary | ICD-10-CM | POA: Diagnosis not present

## 2020-02-24 DIAGNOSIS — B451 Cerebral cryptococcosis: Secondary | ICD-10-CM | POA: Diagnosis present

## 2020-02-24 DIAGNOSIS — Z7901 Long term (current) use of anticoagulants: Secondary | ICD-10-CM | POA: Diagnosis not present

## 2020-02-24 DIAGNOSIS — B457 Disseminated cryptococcosis: Secondary | ICD-10-CM | POA: Diagnosis present

## 2020-02-24 DIAGNOSIS — F17291 Nicotine dependence, other tobacco product, in remission: Secondary | ICD-10-CM | POA: Diagnosis not present

## 2020-02-24 DIAGNOSIS — B948 Sequelae of other specified infectious and parasitic diseases: Secondary | ICD-10-CM

## 2020-02-24 DIAGNOSIS — L89151 Pressure ulcer of sacral region, stage 1: Secondary | ICD-10-CM | POA: Diagnosis present

## 2020-02-24 DIAGNOSIS — Z79899 Other long term (current) drug therapy: Secondary | ICD-10-CM | POA: Diagnosis not present

## 2020-02-24 DIAGNOSIS — E222 Syndrome of inappropriate secretion of antidiuretic hormone: Secondary | ICD-10-CM | POA: Diagnosis present

## 2020-02-24 DIAGNOSIS — Z85828 Personal history of other malignant neoplasm of skin: Secondary | ICD-10-CM

## 2020-02-24 DIAGNOSIS — R918 Other nonspecific abnormal finding of lung field: Secondary | ICD-10-CM | POA: Diagnosis present

## 2020-02-24 DIAGNOSIS — G4700 Insomnia, unspecified: Secondary | ICD-10-CM | POA: Diagnosis not present

## 2020-02-24 DIAGNOSIS — Z86718 Personal history of other venous thrombosis and embolism: Secondary | ICD-10-CM

## 2020-02-24 DIAGNOSIS — J9611 Chronic respiratory failure with hypoxia: Secondary | ICD-10-CM | POA: Diagnosis not present

## 2020-02-24 DIAGNOSIS — F05 Delirium due to known physiological condition: Secondary | ICD-10-CM | POA: Diagnosis not present

## 2020-02-24 DIAGNOSIS — Z86711 Personal history of pulmonary embolism: Secondary | ICD-10-CM | POA: Diagnosis not present

## 2020-02-24 DIAGNOSIS — Z87891 Personal history of nicotine dependence: Secondary | ICD-10-CM | POA: Diagnosis not present

## 2020-02-24 DIAGNOSIS — R482 Apraxia: Secondary | ICD-10-CM | POA: Diagnosis present

## 2020-02-24 DIAGNOSIS — G0490 Encephalitis and encephalomyelitis, unspecified: Secondary | ICD-10-CM | POA: Diagnosis not present

## 2020-02-24 LAB — BASIC METABOLIC PANEL
Anion gap: 3 — ABNORMAL LOW (ref 5–15)
BUN: 30 mg/dL — ABNORMAL HIGH (ref 8–23)
CO2: 30 mmol/L (ref 22–32)
Calcium: 9.2 mg/dL (ref 8.9–10.3)
Chloride: 104 mmol/L (ref 98–111)
Creatinine, Ser: 0.91 mg/dL (ref 0.61–1.24)
GFR calc Af Amer: >60 mL/min (ref >60)
GFR calc non Af Amer: >60 mL/min (ref >60)
Glucose, Bld: 103 mg/dL — ABNORMAL HIGH (ref 70–99)
Potassium: 3.3 mmol/L — ABNORMAL LOW (ref 3.5–5.1)
Sodium: 137 mmol/L (ref 135–145)

## 2020-02-24 LAB — CSF CULTURE W GRAM STAIN

## 2020-02-24 LAB — CRYPTOCOCCUS ANTIGEN, SERUM: Cryptococcus Antigen, Serum: POSITIVE — AB

## 2020-02-24 LAB — CRYPTOCOCCUS AG TITER, SERUM: Cryptococcus Ag Titer, Serum: 1:2560 {titer} — ABNORMAL HIGH

## 2020-02-24 LAB — MAGNESIUM: Magnesium: 2.2 mg/dL (ref 1.7–2.4)

## 2020-02-24 MED ORDER — ONDANSETRON HCL 4 MG/2ML IJ SOLN: 4.0000 mg | Freq: Four times a day (QID) | INTRAMUSCULAR | Status: DC | PRN

## 2020-02-24 MED ORDER — DEXTROSE 5% FOR FLUSHING BEFORE AND AFTER AMBISOME
10.0000 mL | INTRAVENOUS | Status: DC
  Administered 2020-02-25 – 2020-03-12 (×18): 10 mL via INTRAVENOUS
  Filled 2020-02-24 (×7): qty 50
  Filled 2020-02-24: qty 10
  Filled 2020-02-24: qty 50
  Filled 2020-02-24: qty 10
  Filled 2020-02-24 (×3): qty 50
  Filled 2020-02-24: qty 10
  Filled 2020-02-24 (×5): qty 50
  Filled 2020-02-24: qty 10
  Filled 2020-02-24 (×6): qty 50
  Filled 2020-02-24: qty 10
  Filled 2020-02-24 (×2): qty 50
  Filled 2020-02-24: qty 10
  Filled 2020-02-24: qty 50

## 2020-02-24 MED ORDER — DIPHENHYDRAMINE HCL 12.5 MG/5ML PO ELIX: 12.5000 mg | ORAL_SOLUTION | Freq: Four times a day (QID) | ORAL | Status: DC | PRN

## 2020-02-24 MED ORDER — POTASSIUM CHLORIDE CRYS ER 20 MEQ PO TBCR
20.0000 meq | EXTENDED_RELEASE_TABLET | Freq: Once | ORAL | Status: AC
  Administered 2020-02-24: 20 meq via ORAL
  Filled 2020-02-24: qty 1

## 2020-02-24 MED ORDER — DEXTROSE 5 % IV SOLN
200.0000 mg | INTRAVENOUS | Status: DC
  Administered 2020-02-24 – 2020-03-15 (×20): 200 mg via INTRAVENOUS
  Filled 2020-02-24 (×23): qty 50

## 2020-02-24 MED ORDER — ADULT MULTIVITAMIN W/MINERALS CH
1.0000 | ORAL_TABLET | Freq: Every day | ORAL | Status: DC
  Administered 2020-02-25 – 2020-03-15 (×20): 1 via ORAL
  Filled 2020-02-24 (×20): qty 1

## 2020-02-24 MED ORDER — SODIUM CHLORIDE 1 G PO TABS
1.0000 g | ORAL_TABLET | Freq: Three times a day (TID) | ORAL | Status: DC
  Administered 2020-02-24 – 2020-03-15 (×59): 1 g via ORAL
  Filled 2020-02-24 (×61): qty 1

## 2020-02-24 MED ORDER — PREDNISONE 5 MG PO TABS
5.0000 mg | ORAL_TABLET | Freq: Every day | ORAL | Status: DC
  Administered 2020-02-25 – 2020-03-15 (×20): 5 mg via ORAL
  Filled 2020-02-24 (×20): qty 1

## 2020-02-24 MED ORDER — PHENYLEPHRINE HCL 0.25 % NA SOLN
1.0000 | Freq: Four times a day (QID) | NASAL | Status: DC | PRN
  Filled 2020-02-24: qty 15

## 2020-02-24 MED ORDER — APIXABAN 5 MG PO TABS
5.0000 mg | ORAL_TABLET | Freq: Two times a day (BID) | ORAL | Status: DC
  Administered 2020-02-24 – 2020-02-26 (×4): 5 mg via ORAL
  Filled 2020-02-24 (×4): qty 1

## 2020-02-24 MED ORDER — SODIUM CHLORIDE 0.9 % IV BOLUS FOR AMBISOME
500.0000 mL | INTRAVENOUS | Status: DC
  Administered 2020-02-24 – 2020-02-28 (×3): 500 mL via INTRAVENOUS

## 2020-02-24 MED ORDER — SODIUM CHLORIDE 0.9% FLUSH
10.0000 mL | INTRAVENOUS | Status: DC | PRN
  Administered 2020-02-26: 10 mL
  Administered 2020-02-28: 20 mL

## 2020-02-24 MED ORDER — ACETAMINOPHEN 325 MG PO TABS
325.0000 mg | ORAL_TABLET | ORAL | Status: DC | PRN
  Administered 2020-02-26 – 2020-03-14 (×6): 650 mg via ORAL
  Filled 2020-02-24 (×7): qty 2

## 2020-02-24 MED ORDER — BISACODYL 10 MG RE SUPP
10.0000 mg | Freq: Every day | RECTAL | Status: DC | PRN
  Administered 2020-02-29: 10 mg via RECTAL
  Filled 2020-02-24: qty 1

## 2020-02-24 MED ORDER — DEXTROSE 5 % IV SOLN
200.0000 mg | INTRAVENOUS | Status: DC
  Filled 2020-02-24: qty 50

## 2020-02-24 MED ORDER — BUTAMBEN-TETRACAINE-BENZOCAINE 2-2-14 % EX AERO
1.0000 | INHALATION_SPRAY | Freq: Once | CUTANEOUS | Status: DC
  Filled 2020-02-24: qty 20

## 2020-02-24 MED ORDER — FLEET ENEMA 7-19 GM/118ML RE ENEM: 1.0000 | ENEMA | Freq: Once | RECTAL | Status: DC | PRN

## 2020-02-24 MED ORDER — POLYETHYLENE GLYCOL 3350 17 G PO PACK
17.0000 g | PACK | Freq: Every day | ORAL | Status: DC | PRN
  Administered 2020-02-27: 17 g via ORAL
  Filled 2020-02-24: qty 1

## 2020-02-24 MED ORDER — TRAZODONE HCL 50 MG PO TABS
25.0000 mg | ORAL_TABLET | Freq: Every evening | ORAL | Status: DC | PRN
  Administered 2020-03-01 – 2020-03-02 (×2): 25 mg via ORAL
  Filled 2020-02-24 (×8): qty 1

## 2020-02-24 MED ORDER — DIPHENHYDRAMINE HCL 50 MG/ML IJ SOLN
25.0000 mg | Freq: Every day | INTRAMUSCULAR | Status: DC | PRN
  Filled 2020-02-24: qty 0.5

## 2020-02-24 MED ORDER — POTASSIUM CHLORIDE CRYS ER 20 MEQ PO TBCR
40.0000 meq | EXTENDED_RELEASE_TABLET | Freq: Once | ORAL | Status: AC
  Administered 2020-02-24: 40 meq via ORAL
  Filled 2020-02-24: qty 2

## 2020-02-24 MED ORDER — DEXTROSE 5 % IV SOLN: 200.0000 mg | INTRAVENOUS | Status: DC

## 2020-02-24 MED ORDER — SODIUM CHLORIDE 0.9% FLUSH
10.0000 mL | Freq: Two times a day (BID) | INTRAVENOUS | Status: DC
  Administered 2020-02-24 – 2020-02-28 (×6): 10 mL
  Administered 2020-02-29: 15 mL
  Administered 2020-02-29: 10 mL
  Administered 2020-03-01: 15 mL
  Administered 2020-03-01 – 2020-03-15 (×19): 10 mL

## 2020-02-24 MED ORDER — SENNOSIDES-DOCUSATE SODIUM 8.6-50 MG PO TABS
1.0000 | ORAL_TABLET | Freq: Every evening | ORAL | Status: DC | PRN
  Administered 2020-02-27: 1 via ORAL
  Filled 2020-02-24: qty 1

## 2020-02-24 MED ORDER — THIAMINE HCL 100 MG PO TABS
100.0000 mg | ORAL_TABLET | Freq: Every day | ORAL | Status: DC
  Administered 2020-02-25 – 2020-03-15 (×20): 100 mg via ORAL
  Filled 2020-02-24 (×20): qty 1

## 2020-02-24 MED ORDER — PROCHLORPERAZINE EDISYLATE 10 MG/2ML IJ SOLN: 5.0000 mg | Freq: Four times a day (QID) | INTRAMUSCULAR | Status: DC | PRN

## 2020-02-24 MED ORDER — SODIUM CHLORIDE 0.9 % IV BOLUS FOR AMBISOME
500.0000 mL | INTRAVENOUS | Status: DC
  Administered 2020-02-25 – 2020-02-29 (×5): 500 mL via INTRAVENOUS

## 2020-02-24 MED ORDER — FLUCYTOSINE 500 MG PO CAPS
25.0000 mg/kg | ORAL_CAPSULE | Freq: Four times a day (QID) | ORAL | Status: DC
  Administered 2020-02-24 – 2020-02-26 (×8): 1250 mg via ORAL
  Filled 2020-02-24 (×10): qty 1

## 2020-02-24 MED ORDER — DEXTROSE 5% FOR FLUSHING BEFORE AND AFTER AMBISOME
10.0000 mL | INTRAVENOUS | Status: DC
  Administered 2020-02-24 – 2020-03-15 (×19): 10 mL via INTRAVENOUS
  Filled 2020-02-24 (×5): qty 50
  Filled 2020-02-24: qty 10
  Filled 2020-02-24 (×4): qty 50
  Filled 2020-02-24: qty 10
  Filled 2020-02-24 (×6): qty 50
  Filled 2020-02-24 (×3): qty 10
  Filled 2020-02-24: qty 50
  Filled 2020-02-24: qty 10
  Filled 2020-02-24: qty 50
  Filled 2020-02-24: qty 10
  Filled 2020-02-24 (×3): qty 50

## 2020-02-24 MED ORDER — ALUM & MAG HYDROXIDE-SIMETH 200-200-20 MG/5ML PO SUSP: 30.0000 mL | ORAL | Status: DC | PRN

## 2020-02-24 MED ORDER — PROCHLORPERAZINE MALEATE 5 MG PO TABS: 5.0000 mg | ORAL_TABLET | Freq: Four times a day (QID) | ORAL | Status: DC | PRN

## 2020-02-24 MED ORDER — PROCHLORPERAZINE 25 MG RE SUPP: 12.5000 mg | Freq: Four times a day (QID) | RECTAL | Status: DC | PRN

## 2020-02-24 MED ORDER — ENSURE ENLIVE PO LIQD
237.0000 mL | Freq: Three times a day (TID) | ORAL | Status: DC
  Administered 2020-02-24 – 2020-02-25 (×2): 237 mL via ORAL

## 2020-02-24 MED ORDER — DIPHENHYDRAMINE HCL 25 MG PO CAPS: 25.0000 mg | ORAL_CAPSULE | Freq: Every day | ORAL | Status: DC | PRN

## 2020-02-24 MED ORDER — METOPROLOL TARTRATE 25 MG PO TABS: 25.0000 mg | ORAL_TABLET | Freq: Two times a day (BID) | ORAL | Status: DC | PRN

## 2020-02-24 MED ORDER — ONDANSETRON HCL 4 MG PO TABS: 4.0000 mg | ORAL_TABLET | Freq: Four times a day (QID) | ORAL | Status: DC | PRN

## 2020-02-24 MED ORDER — PREDNISONE 5 MG PO TABS: 5.0000 mg | ORAL_TABLET | Freq: Every day | ORAL | Status: DC

## 2020-02-24 MED ORDER — GUAIFENESIN-DM 100-10 MG/5ML PO SYRP: 5.0000 mL | ORAL_SOLUTION | Freq: Four times a day (QID) | ORAL | Status: DC | PRN

## 2020-02-24 MED ORDER — MEPERIDINE HCL 25 MG/ML IJ SOLN: 25.0000 mg | INTRAMUSCULAR | Status: DC | PRN

## 2020-02-24 MED ORDER — SODIUM CHLORIDE 0.9 % IV BOLUS FOR AMBISOME
500.0000 mL | INTRAVENOUS | Status: DC
  Administered 2020-02-24 – 2020-02-29 (×4): 500 mL via INTRAVENOUS

## 2020-02-24 MED ORDER — ENOXAPARIN SODIUM 40 MG/0.4ML ~~LOC~~ SOLN: 40.0000 mg | SUBCUTANEOUS | Status: DC

## 2020-02-24 NOTE — Progress Notes (Signed)
Pharmacy - Liposomal Amphotericin Monitoring  23 YOM with myasthenia gravis on prednisone/mycophenolate.  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  Started on amphotericin - liposomal and flucytosine evening on 3/11. He had LP and BAL performed 3/12 - CSF cx with yeast .  Lung cx unrevealing  Antibiotic history this admission Cefepime/Vancomycin 3/6>3/8 Zosyn 3/8 >> 3/15   Amphotericin B Liposomal  3/11 >> (today day 8)  Flucytosine    3/11 >> (today day 8)  Labs 02/24/2020 - K = 3.3 - Mg = 2.2 - SCr = 0.91 (up from 0.81 3/17) - AST = 33  - ALT = 57  Plan:   Continue Ampohtericin 252m (3.719mkg) IV q24h  Normal saline 50089molus pre and post dose with D5W 31m53mushes in between boluses  Flucytosine 1250mg12mmg/61mPO q6h    Check daily BMP and Mg and replete K+ and Mg daily as needed as both amphotericin and flucytosine can cause hypokalemia.  Pt received 20meq 71mpo early am - will give another 40meq f44m total of 60meq K 72my   Monitor renal function   Monitor CBC and LFTs periodically (flucytosine can cause hepatoxicity and bone marrow suppression- will recheck 3/19 am).    Annet Manukyan MLu Duffel BCPS Clinical Pharmacist 02/24/2020 7:29 AM

## 2020-02-24 NOTE — Care Management Important Message (Signed)
Important Message  Patient Details  Name: Duane Santos MRN: [id number] Date of Birth: Dec 09, 1946   Medicare Important Message Given:  Yes     Shelbie Ammons, RN 02/24/2020, 10:49 AM
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
## 2020-02-24 NOTE — Progress Notes (Addendum)
Spoke with [facility]; transportation arranged for 1300 today. Wife Maudie Mercury updated.
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
## 2020-02-24 NOTE — Progress Notes (Signed)
Inpatient Rehabilitation Medication Review by a Pharmacist  A complete drug regimen review was completed for this patient to identify any potential clinically significant medication issues.  Clinically significant medication issues were identified:  no  Pharmacist comments: N/a  Time spent performing this drug regimen review (minutes):  5 minutes  Acey Lav, PharmD  PGY1 Acute Care Pharmacy Resident 02/24/2020 5:31 PM

## 2020-02-24 NOTE — Progress Notes (Signed)
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 Images: 6 PROCEDURE: Informed consent was obtained from the patient prior to the procedure, including potential complications of headache, allergy, and pain. With the patient prone, the lower back was prepped with Betadine. 1% Lidocaine was used for local anesthesia. Lumbar puncture was performed at the L3-L4 level using a 22 gauge needle with return of clear CSF with an opening pressure of less than 9 cm water. 8 ml of CSF were obtained for laboratory studies. The patient tolerated the procedure well and there were no apparent complications. IMPRESSION: Successful fluoroscopic lumbar puncture. 8 mL clear CSF obtained and submitted to the laboratory. Electronically Signed   By: Eddie Candle M.D.   On: 02/18/2020 14:57   CT Super D Chest W Contrast  Result Date: 02/17/2020 CLINICAL DATA:  74 year old male with altered mental status, history of multifocal pneumonia or in the setting of  myasthenia gravis. EXAM: CT CHEST WITH CONTRAST TECHNIQUE: Multidetector CT imaging of the chest was performed using thin  slice collimation for electromagnetic bronchoscopy planning purposes, with intravenous contrast. CONTRAST:  40mL OMNIPAQUE IOHEXOL 300 MG/ML  SOLN COMPARISON:  Prior PET-CT of 01/24/2020 FINDINGS: Cardiovascular: Ascending dilated to 3.5 cm. Scattered atherosclerosis summer is calcification. Heart size is normal without pericardial effusion. Scattered coronary artery calcification. Central pulmonary vasculature is normal. Mediastinum/Nodes: Small lymph nodes throughout the mediastinum unchanged since recent imaging studies largest 7 mm along the right paratracheal chain. Other smaller nodes track towards the thoracic inlet. No thoracic inlet adenopathy. No axillary lymphadenopathy. No nodal enlargement beyond a cm in the bilateral hila. Correlate with recent PET exam which shows right hilar FDG uptake. Lungs/Pleura: Signs of ground-glass, diminished in general since the previous exam now with more peripheral predominance. Right lower lobe nodule found to be FDG avid in the medial right lung base (image 242, series 5) 1.6 by 0.7 cm not changed accounting for the degree of respiratory motion and was present on the CT angiography there was performed January of 2021 though not well seen on this study due to the motion artifact. Spiculated nodule in the superior segment of the right lower lobe with some central area of added density suggesting some calcification. This measures 2.1 by 2.0 cm, showing a similar appearance. Development of some bronchiectatic changes since the prior study in the left upper lobe, lingula and left lower lobe. Septal thickening is more pronounced also with dilation of left upper lobe bronchi. Process shows a more apical and peripheral than basilar predominance and is occurred in the short interval. No consolidation. No pleural effusion. Upper Abdomen: Incidental imaging of upper abdominal contents is unremarkable. Musculoskeletal: Unchanged appearance of sclerotic foci and right-sided ribs.  Signs of prior fracture to left-sided ribs midthoracic compression fractures without interval change. IMPRESSION: 1. Signs of bronchiectatic changes and septal thickening more so than the ground-glass that was seen on the previous exam. Bronchiectasis was present previously and may be slightly worse on today's exam though the lung parenchyma is better evaluated on today's study. Post inflammatory fibrosis is considered, by report in the medical record the patient has a history of COVID infection. 2. Spiculated nodule superior segment right lower lobe with central area of added density suggesting some calcification, wall finding remains concerning for bronchogenic neoplasm the possibility of sequela of granulomatous infection could also be considered. Biopsy is suggested. 3. Correlate with recent PET exam which shows right hilar FDG uptake. 4. Stable appearance of sclerotic foci in the right-sided ribs. 5. 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 Recommend annual imaging followup by CTA or MRA. This recommendation follows 2010 ACCF/AHA/AATS/ACR/ASA/SCA/SCAI/SIR/STS/SVM Guidelines for the Diagnosis and Management of Patients with Thoracic Aortic Disease. Circulation.2010; 121: L976-B341. Aortic aneurysm NOS (ICD10-I71.9) 6. Pulmonary embolism seen on prior exam is not demonstrated on the current exam. 7. Scattered coronary artery calcification. Electronically Signed   By: Zetta Bills M.D.   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 -he will need to be off Eliquis for 7d prior to procedure -he wants to be DNR -Palliative evaluation is inprogress    Disseminated cryptococcal meningoencephalitis  -Infectious disease on case-appreciate input-following recommendations   -Crypto antigen elevated - LP results noted  - currently  on AmphoB  - decreasing steroids as per neuro  COVID19 infection - patient is no longer hypoxemic and is able to have saturations over 90% while at rest without using supplemental oxygen -we will order O2 concentrator   Severe hyponatremia   - repleted    - likely due to overwhelming infection  Altered mental status  - likely due to hyponatremia and septic encephalopathy  -continue current plan   Myesthenia Gravis   -decreasing prednisone as per primary neurologist5  - s/p neuro evaluation - possible mestinon therapy soon -monitor for MG exacerbation post steroid taper    Pulmonary embolism - patient on eliquis- will hold for now. He is on Lovenox now, we will stop this on Wednesday 02/16/20 -Have stopped Eliquis prior to hospitalization in preparation for ENB/EBUS- have restarted eliquis      Thank you for allowing me to participate in the care of this patient.   Patient/Family are satisfied with care plan and all questions have been answered.  This document was prepared using Dragon voice recognition software and may include unintentional dictation errors.     Ottie Glazier, M.D.  Division of [facility]

## 2020-02-24 NOTE — Progress Notes (Signed)
Inpatient Rehab Admissions Coordinator:   I have insurance auth and a bed available for pt transition to CIR today.  Dr. Naaman Plummer (CIR) and Dr. Manuella Ghazi (acute attending MD) on board.  Will let pt/family, RN, and CM know.  Will arrange transport with [facility] for pt pickup around noon.   Shann Medal, PT, DPT Admissions Coordinator [phone] 02/24/20  10:01 AM

## 2020-02-24 NOTE — TOC Transition Note (Signed)
Transition of Care [facility]) - CM/SW Discharge Note   Patient Details  Name: Duane Santos MRN: [id number] Date of Birth: [birth year]  [facility] CM/SW Contact:  Elease Hashimoto, LCSW Phone Number: 02/24/2020, 10:44 AM   Clinical Narrative:  Spoke with wife who is aware pt to transfer to CIR today. She has spoken with Dr Naaman Plummer, Dr Manuella Ghazi and [place] regarding the plan. All in agreement and insurance has authorization for transfer today. [facility] contacted to provide transport. Set for DC today. Bedside RN aware of plan.   11:59 Spoke with wife who will follow [facility] to CIR. Answered her questions and both ready for his transfer. [facility] to be here at 1:00 m   Final next level of care: IP Rehab Facility Barriers to Discharge: Barriers Resolved   Patient Goals and CMS Choice Patient states their goals for this hospitalization and ongoing recovery are:: Am I going somewhere? Pt somewhat confused , called wife who is coming at 1:00      Discharge Placement                Patient to be transferred to facility by: [place] Name of family member notified: Kim-wife Patient and family notified of of transfer: 02/24/20  Discharge Plan and Services In-house Referral: Clinical Social Work   Post Acute Care Choice: Home Health, Durable Medical Equipment                               Social Determinants of Health (SDOH) Interventions     Readmission Risk Interventions Readmission Risk Prevention Plan 12/30/2019  Transportation Screening Complete  Home Care Screening Complete  Medication Review (RN CM) Complete  Some recent data might be hidden
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
## 2020-02-25 ENCOUNTER — Encounter (HOSPITAL_COMMUNITY): Payer: Self-pay | Admitting: Physical Medicine & Rehabilitation

## 2020-02-25 ENCOUNTER — Inpatient Hospital Stay (HOSPITAL_COMMUNITY): Payer: Medicare HMO | Admitting: Speech Pathology

## 2020-02-25 ENCOUNTER — Inpatient Hospital Stay (HOSPITAL_COMMUNITY): Payer: Medicare HMO | Admitting: Occupational Therapy

## 2020-02-25 ENCOUNTER — Inpatient Hospital Stay (HOSPITAL_COMMUNITY): Payer: Medicare HMO | Admitting: Physical Therapy

## 2020-02-25 DIAGNOSIS — Z86711 Personal history of pulmonary embolism: Secondary | ICD-10-CM

## 2020-02-25 DIAGNOSIS — E43 Unspecified severe protein-calorie malnutrition: Secondary | ICD-10-CM | POA: Insufficient documentation

## 2020-02-25 DIAGNOSIS — L89151 Pressure ulcer of sacral region, stage 1: Secondary | ICD-10-CM

## 2020-02-25 DIAGNOSIS — Z86718 Personal history of other venous thrombosis and embolism: Secondary | ICD-10-CM

## 2020-02-25 DIAGNOSIS — F17291 Nicotine dependence, other tobacco product, in remission: Secondary | ICD-10-CM

## 2020-02-25 DIAGNOSIS — Z8616 Personal history of COVID-19: Secondary | ICD-10-CM

## 2020-02-25 LAB — HEPATIC FUNCTION PANEL
ALT: 62 U/L — ABNORMAL HIGH (ref 0–44)
AST: 44 U/L — ABNORMAL HIGH (ref 15–41)
Albumin: 2.6 g/dL — ABNORMAL LOW (ref 3.5–5.0)
Alkaline Phosphatase: 64 U/L (ref 38–126)
Bilirubin, Direct: 0.2 mg/dL (ref 0.0–0.2)
Indirect Bilirubin: 0.4 mg/dL (ref 0.3–0.9)
Total Bilirubin: 0.6 mg/dL (ref 0.3–1.2)
Total Protein: 5.2 g/dL — ABNORMAL LOW (ref 6.5–8.1)

## 2020-02-25 LAB — CBC WITH DIFFERENTIAL/PLATELET
Abs Immature Granulocytes: 0.35 10*3/uL — ABNORMAL HIGH (ref 0.00–0.07)
Basophils Absolute: 0 10*3/uL (ref 0.0–0.1)
Basophils Relative: 0 %
Eosinophils Absolute: 0.1 10*3/uL (ref 0.0–0.5)
Eosinophils Relative: 1 %
HCT: 34 % — ABNORMAL LOW (ref 39.0–52.0)
Hemoglobin: 11.1 g/dL — ABNORMAL LOW (ref 13.0–17.0)
Immature Granulocytes: 5 %
Lymphocytes Relative: 6 %
Lymphs Abs: 0.4 10*3/uL — ABNORMAL LOW (ref 0.7–4.0)
MCH: 33.4 pg (ref 26.0–34.0)
MCHC: 32.6 g/dL (ref 30.0–36.0)
MCV: 102.4 fL — ABNORMAL HIGH (ref 80.0–100.0)
Monocytes Absolute: 0.8 10*3/uL (ref 0.1–1.0)
Monocytes Relative: 12 %
Neutro Abs: 5.3 10*3/uL (ref 1.7–7.7)
Neutrophils Relative %: 76 %
Platelets: 231 10*3/uL (ref 150–400)
RBC: 3.32 MIL/uL — ABNORMAL LOW (ref 4.22–5.81)
RDW: 15.8 % — ABNORMAL HIGH (ref 11.5–15.5)
WBC: 7 10*3/uL (ref 4.0–10.5)
nRBC: 0 % (ref 0.0–0.2)

## 2020-02-25 LAB — BASIC METABOLIC PANEL
Anion gap: 8 (ref 5–15)
BUN: 24 mg/dL — ABNORMAL HIGH (ref 8–23)
CO2: 25 mmol/L (ref 22–32)
Calcium: 9.3 mg/dL (ref 8.9–10.3)
Chloride: 105 mmol/L (ref 98–111)
Creatinine, Ser: 0.91 mg/dL (ref 0.61–1.24)
GFR calc Af Amer: >60 mL/min (ref >60)
GFR calc non Af Amer: >60 mL/min (ref >60)
Glucose, Bld: 135 mg/dL — ABNORMAL HIGH (ref 70–99)
Potassium: 3.5 mmol/L (ref 3.5–5.1)
Sodium: 138 mmol/L (ref 135–145)

## 2020-02-25 LAB — MAGNESIUM: Magnesium: 2.1 mg/dL (ref 1.7–2.4)

## 2020-02-25 MED ORDER — PRO-STAT SUGAR FREE PO LIQD
30.0000 mL | Freq: Two times a day (BID) | ORAL | Status: DC
  Administered 2020-02-25 – 2020-03-15 (×39): 30 mL via ORAL
  Filled 2020-02-25 (×36): qty 30

## 2020-02-25 MED ORDER — CHLORHEXIDINE GLUCONATE CLOTH 2 % EX PADS
6.0000 | MEDICATED_PAD | Freq: Every day | CUTANEOUS | Status: DC
  Administered 2020-02-26 – 2020-03-04 (×7): 6 via TOPICAL

## 2020-02-25 MED ORDER — ENSURE ENLIVE PO LIQD
237.0000 mL | Freq: Four times a day (QID) | ORAL | Status: DC
  Administered 2020-02-25 – 2020-03-08 (×33): 237 mL via ORAL

## 2020-02-25 MED ORDER — POTASSIUM CHLORIDE CRYS ER 20 MEQ PO TBCR
40.0000 meq | EXTENDED_RELEASE_TABLET | Freq: Every day | ORAL | Status: DC
  Administered 2020-02-25 – 2020-03-15 (×20): 40 meq via ORAL
  Filled 2020-02-25 (×21): qty 2

## 2020-02-25 NOTE — Plan of Care (Signed)
  Problem: Consults Goal: RH GENERAL PATIENT EDUCATION Description: See Patient Education module for education specifics. Outcome: Progressing Goal: Skin Care Protocol Initiated - if Braden Score 18 or less Description: If consults are not indicated, leave blank or document N/A Outcome: Progressing Goal: Nutrition Consult-if indicated Outcome: Progressing   Problem: RH BOWEL ELIMINATION Goal: RH STG MANAGE BOWEL WITH ASSISTANCE Description: STG Manage Bowel with mod I Assistance. Outcome: Progressing Goal: RH STG MANAGE BOWEL W/MEDICATION W/ASSISTANCE Description: STG Manage Bowel with Medication with mod I Assistance. Outcome: Progressing   Problem: RH BLADDER ELIMINATION Goal: RH STG MANAGE BLADDER WITH ASSISTANCE Description: STG Manage Bladder With Assistance Outcome: Progressing Goal: RH STG MANAGE BLADDER WITH EQUIPMENT WITH ASSISTANCE Description: STG Manage Bladder With Equipment With Assistance Outcome: Progressing   Problem: RH SKIN INTEGRITY Goal: RH STG SKIN FREE OF INFECTION/BREAKDOWN Description: Will have no new areas of break down Outcome: Progressing Goal: RH STG MAINTAIN SKIN INTEGRITY WITH ASSISTANCE Description: STG Maintain Skin Integrity With mod I Assistance. Outcome: Progressing Goal: RH STG ABLE TO PERFORM INCISION/WOUND CARE W/ASSISTANCE Description: STG Able To Perform Incision/Wound Care With mod I Assistance. Outcome: Progressing   Problem: RH SAFETY Goal: RH STG ADHERE TO SAFETY PRECAUTIONS W/ASSISTANCE/DEVICE Description: STG Adhere to Safety Precautions With Assistance/Device. Outcome: Progressing   Problem: RH PAIN MANAGEMENT Goal: RH STG PAIN MANAGED AT OR BELOW PT'S PAIN GOAL Description: Pain scale <2/10 Outcome: Progressing   Problem: RH KNOWLEDGE DEFICIT GENERAL Goal: RH STG INCREASE KNOWLEDGE OF SELF CARE AFTER HOSPITALIZATION Outcome: Progressing
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
## 2020-02-25 NOTE — Progress Notes (Signed)
[facility] PHYSICAL MEDICINE & REHABILITATION PROGRESS NOTE   Subjective/Complaints: Lying in bed asleep when I entered. Had eaten breakfast before I got in room. Denies pain. Says he slept  ROS: Limited due to cognitive/behavioral    Objective:   No results found. Recent Labs    02/23/20 0642 02/25/20 0430  WBC 9.2 7.0  HGB 13.9 11.1*  HCT 40.7 34.0*  PLT 245 231   Recent Labs    02/24/20 0303 02/25/20 0430  NA 137 138  K 3.3* 3.5  CL 104 105  CO2 30 25  GLUCOSE 103* 135*  BUN 30* 24*  CREATININE 0.91 0.91  CALCIUM 9.2 9.3    Intake/Output Summary (Last 24 hours) at 02/25/2020 1240 Last data filed at 02/25/2020 0834 Gross per 24 hour  Intake 1720 ml  Output --  Net 1720 ml     Physical Exam: Vital Signs Blood pressure (!) 120/103, pulse 93, temperature 97.9 F (36.6 C), resp. rate 18, height 5\' 6"  (1.676 m), weight 53 kg, SpO2 100 %. Constitutional: No distress . Vital signs reviewed. Frail appearing HEENT: EOMI, oral membranes fairly moist, fair dentition Neck: supple Cardiovascular: RRR without murmur. No JVD    Respiratory/Chest: CTA Bilaterally without wheezes or rales. Normal effort    GI/Abdomen: BS +, non-tender, non-distended Ext: no clubbing, cyanosis, or edema .   Musculoskeletal:   cachectic, full PROM and gross AROM. Neurological: lethargic. Follows basic commands. Told me where he lived. Knew he was in hospital. Moves all 4 limbs 2+ to 3/5. Senses pain in all 4's.   Skin: Skin is warm and dry.  Skin tears left forearm, left elbow and right forearm, healing abrasion right knee with foam dressings Stage I sacral ulcer clean, with foam dressing in place Psychiatric:  Lethargic but arouses to verbal stim and can engage in conversation     Assessment/Plan: 1. 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  Physiatrist is providing close team supervision and 24 hour management of active medical problems listed below.  Physiatrist and rehab team continue to assess barriers to discharge/monitor patient progress toward functional and medical goals  Care Tool:  Bathing              Bathing assist       Upper Body Dressing/Undressing Upper body dressing   What is the patient wearing?: Hospital gown only    Upper body assist      Lower Body Dressing/Undressing Lower body dressing            Lower body assist       Toileting Toileting    Toileting assist Assist for toileting: Maximal Assistance - Patient 25 - 49%     Transfers Chair/bed transfer  Transfers assist  Chair/bed transfer activity did not occur: Safety/medical concerns  Chair/bed transfer assist level: 2 Helpers     Locomotion Ambulation   Ambulation assist              Walk 10 feet activity   Assist           Walk 50 feet activity   Assist           Walk 150 feet activity   Assist           Walk 10 feet on uneven surface  activity   Assist  Wheelchair     Assist               Wheelchair 50 feet with 2 turns activity    Assist            Wheelchair 150 feet activity     Assist          Blood pressure (!) 120/103, pulse 93, temperature 97.9 F (36.6 C), resp. rate 18, height 5\' 6"  (1.676 m), weight 53 kg, SpO2 100 %.  Medical Problem List and Plan: 1.  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 Has been gradually improving from chart. Observe today 2. DVT/PE/ Antithrombotics: -DVT/anticoagulation:  Pharmaceutical: Other (comment)--on  Eliquis             -antiplatelet therapy: N/a 3. Pain Management: N/a 4. Mood: LCSW to monitor for evaluation and support when appropriate.              -antipsychotic agents: N/A 5. Neuropsych: This patient is not capable of making decisions on his own behalf. 6. Skin/Wound Care: routine pressure relief measures.  7. Fluids/Electrolytes/Nutrition: Monitor I/O. Check daily labs  -severe protein deficient malnutrition  -dietary supplements, RD following  -intake poor to inconsistent at present, might benefit from megace trial 8. Disseminated cryptococcal infection/meningoencephalitis: On Amphotericin B daily and Flucytosine every 6 hours--  daily BMP, Mg and K+ as well as periodic LFTs.     3/19-I personally reviewed the patient's labs today.     -LFT's sl elevated--recheck Monday 9. Hyponatremia: 138 3/19 10. Myasthenia gravis: Has been off Cellcept since Covid. Prednisone decreased to 5 mg daily on 03/18 with plans to d/c over next few days. - WIFE wants pt's Neurologist- a Emergency planning/management officer on Myasthenia gravis called with any med changes- Dr Westley Foots- [phone]  11. Post Covid hypoxia: Has resolved--current O2 sats on room ar 99%- continue to monitor.   12. Hypokalemia: Due to meds--being supplemented intermittently.   3/19 3.5 13. Stage I pressure ulcer and skin tears-   routine skin care and call WOC if needed.    -appropriate turning  -improve nutrition as above  -foam dressing.   .     LOS: 1 days A FACE TO FACE EVALUATION WAS PERFORMED  [name] 02/25/2020, 12:40 PM

## 2020-02-25 NOTE — Care Management (Signed)
[facility] Individual Statement of Services  Patient Name:  Duane Santos  Date:  02/25/2020  Welcome to the [facility].  Our goal is to provide you with an individualized program based on your diagnosis and situation, designed to meet your specific needs.  With this comprehensive rehabilitation program, you will be expected to participate in at least 3 hours of rehabilitation therapies Monday-Friday, with modified therapy programming on the weekends.  Your rehabilitation program will include the following services:  Physical Therapy (PT), Occupational Therapy (OT), Speech Therapy (ST), 24 hour per day rehabilitation nursing, Therapeutic Recreaction (TR), Psychology, Neuropsychology, Case Management (Social Worker), Rehabilitation Medicine, Nutrition Services, Pharmacy Services and Other  Weekly team conferences will be held on Tuesdays to discuss your progress.  Your Social Worker will talk with you frequently to get your input and to update you on team discussions.  Team conferences with you and your family in attendance may also be held.  Expected length of stay: 14-17 days    Overall anticipated outcome: Supervision  Depending on your progress and recovery, your program may change. Your Social Worker will coordinate services and will keep you informed of any changes. Your Social Worker's name and contact numbers are listed  below.  The following services may also be recommended but are not provided by the [facility] will be made to provide these services after discharge if needed.  Arrangements include referral to agencies that provide these services.  Your insurance has been verified to be:  Uninsured  Your primary doctor is:  [employer]  Pertinent information will be  shared with your doctor and your insurance company.  Social Worker:  Loralee Pacas, LCSWA  Information discussed with and copy given to patient by: Rana Snare, 02/25/2020, 4:27 PM

## 2020-02-25 NOTE — IPOC Note (Signed)
Overall Plan of Care [facility]) Patient Details Name: Duane Santos MRN: [id number] DOB: September 23, 1946  Admitting Diagnosis: Cryptococcal meningitis [facility] Santos)  Hospital Problems: Principal Problem:   Cryptococcal meningitis (Duane Santos) Active Problems:   Myasthenia gravis (Duane Santos)   Encephalitis   Severe malnutrition (Duane Santos)   Stage I pressure ulcer of sacral region   Protein-calorie malnutrition, severe     Functional Problem List: Nursing Bladder, Bowel, Endurance, Nutrition, Safety, Pain, Perception  PT Balance, Perception, Behavior, Safety, Sensory, Endurance, Motor, Pain, Nutrition  OT Balance, Cognition, Motor, Endurance, Safety  SLP Cognition, Endurance  TR         Basic ADL's: OT Eating, Grooming, Bathing, Dressing, Toileting     Advanced  ADL's: OT       Transfers: PT Bed Mobility, Bed to Chair, Administrator, arts, Toilet     Locomotion: PT Ambulation, Emergency planning/management officer, Stairs     Additional Impairments: OT Fuctional Use of Upper Extremity  SLP Social Cognition   Social Interaction, Problem Solving, Memory, Attention, Awareness  TR      Anticipated Outcomes Item Anticipated Outcome  Self Feeding Supervision  Swallowing      Basic self-care  Supervision  Toileting  Supervision   Bathroom Transfers Supervision  Bowel/Bladder  manage bowel and bladder with min assist  Transfers  supervision with LRAD  Locomotion  supervision with LRAD except CGA stair negotiation  Communication     Cognition  Mod A for basic  Pain  less than 4 on scale of 0-10  Safety/Judgment  remain free of injury, prevent falls with min assist   Therapy Plan: PT Intensity: Minimum of 1-2 x/day ,45 to 90 minutes PT Frequency: 5 out of 7 days PT Duration Estimated Length of Stay: 2 weeks OT Intensity: Minimum of 1-2 x/day, 45 to 90 minutes OT Frequency: 5 out of 7 days OT Duration/Estimated Length of Stay: 14-17 days SLP Intensity: Minumum of 1-2 x/day, 30 to 90 minutes SLP  Frequency: 3 to 5 out of 7 days SLP Duration/Estimated Length of Stay: 14 to 17 days   Due to the current state of emergency, patients may not be receiving their 3-hours of Medicare-mandated therapy.   Team Interventions: Nursing Interventions Patient/Family Education, Pain Management, Discharge Planning, Medication Management, Bladder Management, Bowel Management, Psychosocial Support, Disease Management/Prevention  PT interventions Ambulation/gait training, Community reintegration, Neuromuscular re-education, DME/adaptive equipment instruction, Psychosocial support, Stair training, UE/LE Strength taining/ROM, Wheelchair propulsion/positioning, UE/LE Coordination activities, Therapeutic Activities, Skin care/wound management, Pain management, Discharge planning, Balance/vestibular training, Cognitive remediation/compensation, Disease management/prevention, Functional mobility training, Patient/family education, Splinting/orthotics, Therapeutic Exercise, Visual/perceptual remediation/compensation  OT Interventions Balance/vestibular training, Cognitive remediation/compensation, Community reintegration, Discharge planning, DME/adaptive equipment instruction, Functional mobility training, Neuromuscular re-education, Patient/family education, Pain management, Psychosocial support, Self Care/advanced ADL retraining, Therapeutic Activities, Therapeutic Exercise, UE/LE Coordination activities, UE/LE Strength taining/ROM, Wheelchair propulsion/positioning  SLP Interventions Functional tasks, Patient/family education  TR Interventions    SW/CM Interventions Discharge Planning, Psychosocial Support, Patient/Family Education   Barriers to Discharge MD  Medical stability  Nursing      PT Decreased caregiver support, Home environment access/layout, Incontinence unsure if family can provide 24 hr assist at d/c  OT      SLP      SW       Team Discharge Planning: Destination: PT-Home ,OT- Home ,  SLP-Home Projected Follow-up: PT-24 hour supervision/assistance, Home health PT, OT-  Home health OT, SLP-(TBD) Projected Equipment Needs: PT-To be determined, OT- To be determined, SLP-None recommended by SLP Equipment Details:  PT-unsure of what DME pt already has, will need RW & transport w/c for community mobility, OT-  Patient/family involved in discharge planning: PT- Patient unable/family or caregiver not available,  OT-Patient, SLP-Patient unable/family or caregive not available  MD ELOS: 14-17 days Medical Rehab Prognosis:  Good Assessment: The patient has been admitted for CIR therapies with the diagnosis of cryptococcal meningitis in setting of baseline MG and recent COVID infection. The team will be addressing functional mobility, strength, stamina, balance, safety, adaptive techniques and equipment, self-care, bowel and bladder mgt, patient and caregiver education, NMR, activity tolerance, cogniiton/activation. Goals have been set at supervision for self-care and supervision for mobility and cognition.   Due to the current state of emergency, patients may not be receiving their 3 hours per day of Medicare-mandated therapy.    Duane Staggers, MD, FAAPMR      See Team Conference Notes for weekly updates to the plan of care
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
## 2020-02-25 NOTE — Progress Notes (Signed)
Inpatient Rehabilitation  Patient information reviewed and entered into eRehab system by Monda Chastain M. Tamarcus Condie, M.A., CCC/SLP, PPS Coordinator.  Information including medical coding, functional ability and quality indicators will be reviewed and updated through discharge.    
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
## 2020-02-25 NOTE — Progress Notes (Signed)
Late entry  Pt admitted to room [phone]. Pt lethargic with apneic breathing, but able to awake when called multiple times. Dr. Dagoberto Ligas and Jeannene Patella, PA made aware of pt breathing pattern. Wife at bedside to assist with admission questions. Pt denies any pain or discomfort.  Gerald Stabs, RN
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
## 2020-02-25 NOTE — Progress Notes (Signed)
Pharmacy Antibiotic Note  Duane Santos is a 74 y.o. male admitted on 02/24/2020 with meningitis.  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  Plan: 60 YOM with myasthenia gravis on prednisone/mycophenolate.  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  Started on amphotericin - liposomal and flucytosine evening on 3/11. He had LP and BAL performed 3/12 - CSF cx with yeast .  Transferred to inpatient rehab on 3/18.  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  Magnesium in the normal range, on no schedule supplementation Potassium 3.5, low normal.  Received 18mq KCL po yesterday.    Plan:  Schedule KCL 480m po daily. Daily Bmet and Mg  Height: _0  (167.6 cm) Weight: 116 lb 13.5 oz (53 kg) IBW/kg (Calculated) : 63.8  Temp (24hrs), Avg:97.5 F (36.4 C), Min:97 F (36.1 C), Max:98 F (36.7 C)  Recent Labs  Lab 02/20/20 0522 02/20/20 0522 02/21/20 0615 02/22/20 0400 02/22/20 0401 02/23/20 0642 02/24/20 0303 02/25/20 0430  WBC 9.4  --  8.8  --  7.4 9.2  --  7.0  CREATININE 0.66   < > 0.84 0.80  --  0.81 0.91 0.91   < > = values in this interval not displayed.    Estimated Creatinine Clearance: 55 mL/min (by C-G formula based on SCr of 0.91 mg/dL).   Mg 2.1 K 3.5  Na 138  No Known Allergies  Antimicrobials this admission: Cefepime/Vancomycin 3/6>3/8 Zosyn 3/8 >> 3/15  Ambisome 3/11>> Flucytosine 3/11>>  Dose adjustments this admission: None  Microbiology results: 3/12 CSF>> cryptococcus neoformans 3/6 Blood >>NG 3/7 MRSA pcr negative  Thank you for allowing pharmacy to be a part of this patient's care.  FrCandie Mile/19/2021 7:28 AM

## 2020-02-26 ENCOUNTER — Inpatient Hospital Stay (HOSPITAL_COMMUNITY): Payer: Medicare HMO | Admitting: Occupational Therapy

## 2020-02-26 DIAGNOSIS — Z7901 Long term (current) use of anticoagulants: Secondary | ICD-10-CM

## 2020-02-26 LAB — BASIC METABOLIC PANEL
Anion gap: 9 (ref 5–15)
BUN: 18 mg/dL (ref 8–23)
CO2: 26 mmol/L (ref 22–32)
Calcium: 9 mg/dL (ref 8.9–10.3)
Chloride: 103 mmol/L (ref 98–111)
Creatinine, Ser: 0.83 mg/dL (ref 0.61–1.24)
GFR calc Af Amer: >60 mL/min (ref >60)
GFR calc non Af Amer: >60 mL/min (ref >60)
Glucose, Bld: 112 mg/dL — ABNORMAL HIGH (ref 70–99)
Potassium: 3.3 mmol/L — ABNORMAL LOW (ref 3.5–5.1)
Sodium: 138 mmol/L (ref 135–145)

## 2020-02-26 LAB — MAGNESIUM: Magnesium: 2.1 mg/dL (ref 1.7–2.4)

## 2020-02-26 LAB — APTT: aPTT: 27 seconds (ref 24–36)

## 2020-02-26 LAB — HEPARIN LEVEL (UNFRACTIONATED): Heparin Unfractionated: >2.2 IU/mL — ABNORMAL HIGH (ref 0.30–0.70)

## 2020-02-26 MED ORDER — POTASSIUM CHLORIDE CRYS ER 20 MEQ PO TBCR
40.0000 meq | EXTENDED_RELEASE_TABLET | Freq: Once | ORAL | Status: AC
  Administered 2020-02-26: 40 meq via ORAL
  Filled 2020-02-26: qty 2

## 2020-02-26 MED ORDER — ALTEPLASE 2 MG IJ SOLR
2.0000 mg | Freq: Once | INTRAMUSCULAR | Status: AC
  Administered 2020-02-26: 2 mg
  Filled 2020-02-26: qty 2

## 2020-02-26 MED ORDER — FLUCYTOSINE 250 MG PO CAPS
25.0000 mg/kg | ORAL_CAPSULE | Freq: Four times a day (QID) | ORAL | Status: DC
  Administered 2020-02-26 – 2020-03-15 (×71): 1250 mg via ORAL
  Filled 2020-02-26 (×76): qty 5

## 2020-02-26 MED ORDER — HEPARIN (PORCINE) 25000 UT/250ML-% IV SOLN
800.0000 [IU]/h | INTRAVENOUS | Status: DC
  Administered 2020-02-26: 800 [IU]/h via INTRAVENOUS
  Filled 2020-02-26: qty 250

## 2020-02-26 NOTE — Progress Notes (Signed)
[facility] for Infectious Disease   Reason for visit: Follow up on cryptococcal meningitis  Interval History: no acute events.  Afebrile.  Day 10 amphotericin, flucytosine   Physical Exam: Constitutional:  Vitals:   02/25/20 0320 02/26/20 0508  BP: (!) 120/103 (!) 142/87  Pulse: 93 85  Resp: 18 18  Temp: 97.9 F (36.6 C) 97.6 F (36.4 C)  SpO2: 100% 100%   patient appears in NAD Respiratory: Normal respiratory effort; CTA B Cardiovascular: RRR GI: soft, nt, nd  Review of Systems: Constitutional: negative for fevers and chills Gastrointestinal: negative for diarrhea  Lab Results  Component Value Date   WBC 7.0 02/25/2020   HGB 11.1 (L) 02/25/2020   HCT 34.0 (L) 02/25/2020   MCV 102.4 (H) 02/25/2020   PLT 231 02/25/2020    Lab Results  Component Value Date   CREATININE 0.83 02/26/2020   BUN 18 02/26/2020   NA 138 02/26/2020   K 3.3 (L) 02/26/2020   CL 103 02/26/2020   CO2 26 02/26/2020    Lab Results  Component Value Date   ALT 62 (H) 02/25/2020   AST 44 (H) 02/25/2020   ALKPHOS 64 02/25/2020     Microbiology: Recent Results (from the past 240 hour(s))  Culture, bal-quantitative     Status: None   Collection Time: 02/18/20 10:00 AM   Specimen: Bronchoalveolar Lavage  Result Value Ref Range Status   Specimen Description   Final    BRONCHIAL ALVEOLAR LAVAGE Performed at [facility], [address], [place], [place] [address]    Special Requests   Final    NONE Performed at [facility], [address] [place], [place] [address]    Gram Stain NO WBC SEEN NO ORGANISMS SEEN   Final   Culture   Final    NO GROWTH 2 DAYS Performed at [facility], [place] [address], [place], [place] [address]    Report Status 02/20/2020 FINAL  Final  Culture, fungus without smear     Status: Abnormal (Preliminary result)   Collection Time: 02/18/20 10:02 AM   Specimen: Lung; Cerebrospinal Fluid  Result Value Ref Range Status   Specimen Description   Final    LUNG Performed at [facility], [address], [place], [place] [address]    Special Requests   Final    LLL Performed at [facility], [address], [place], [place] [address]    Culture (A)  Final    CRYPTOCOCCUS NEOFORMANS CULTURE REINCUBATED FOR BETTER GROWTH Performed at [facility], [place] [address], [place], [place] [address]    Report Status PENDING  Incomplete  Fungus Culture With Stain     Status: None (Preliminary result)   Collection Time: 02/18/20 10:02 AM   Specimen: Lung, Right  Result Value Ref Range Status   Fungus Stain Final report  Final    Comment: (NOTE) Performed At: [facility] [address] [place], [place] [id number] Rush Farmer MD [id number]    Fungus (Mycology) Culture PENDING  Incomplete   Fungal Source BRONCHIAL ALVEOLAR LAVAGE  Final    Comment: Performed at [facility], [address] [place],  [address]  Fungus Culture Result     Status: None   Collection Time: 02/18/20 10:02 AM  Result Value Ref Range Status   Result 1 Comment  Final    Comment: (NOTE) KOH/Calcofluor preparation:  no fungus observed. Performed At: [facility] [address]  [place], [place] [id number] Rush Farmer MD [id number]   CSF culture     Status: Abnormal   Collection Time: 02/18/20  2:51 PM   Specimen: CSF; Cerebrospinal Fluid  Result Value Ref Range Status   Specimen Description   Final    CSF Performed at [facility], [address] [place], [place] [address]    Special Requests   Final    NONE Performed at [facility], [place], [place] [address]    Gram Stain (A)  Final    YEAST CRITICAL RESULT CALLED TO, READ BACK BY AND VERIFIED WITH: SERENITY KIRKENDALL AT 1600 02/18/20.PMF WBC SEEN RED BLOOD CELLS PRESENT Performed at [facility], [address] [place], [place] [address]    Culture  CRYPTOCOCCUS NEOFORMANS (A)  Final   Report Status 02/24/2020 FINAL  Final    Impression/Plan:  1. 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 He will need at least 4 weeks, depending on clinical course and CSF results.   Though his initial opening pressure was 9, I feel he needs a repeat LP to verify that, particularly with his continued confusion, to verify his cell count and glucose is normalizing and overall improved.    2.  PE/DVT - on eliquis and with the need for an LP, will stop this, start a heparin drip per pharmacy and then LP can be done on Monday.    3. Medication monitoring - mag, creat, remain wnl.

## 2020-02-26 NOTE — Progress Notes (Signed)
Pharmacy Antibiotic Note  Duane Santos is a 74 y.o. male admitted on 02/24/2020 with meningitis.  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  Plan: 5 YOM with myasthenia gravis on prednisone/mycophenolate.  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  Started on amphotericin - liposomal and flucytosine evening on 3/11. He had LP and BAL performed 3/12 - CSF cx with yeast .  Transferred to inpatient rehab on 3/18.  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  Magnesium in the normal range, on no schedule supplementation Potassium 3.3, low normal, on KCl 40 daily.   Plan: Supplementing with additional KCl 40 mEq x1 Daily Bmet and Mg  Height: _0  (167.6 cm) Weight: 116 lb 13.5 oz (53 kg) IBW/kg (Calculated) : 63.8  Temp (24hrs), Avg:98 F (36.7 C), Min:97.6 F (36.4 C), Max:98.5 F (36.9 C)  Recent Labs  Lab 02/20/20 0522 02/20/20 0522 02/21/20 0615 02/21/20 0615 02/22/20 0400 02/22/20 0401 02/23/20 0642 02/24/20 0303 02/25/20 0430 02/26/20 0412  WBC 9.4  --  8.8  --   --  7.4 9.2  --  7.0  --   CREATININE 0.66   < > 0.84   < > 0.80  --  0.81 0.91 0.91 0.83   < > = values in this interval not displayed.    Estimated Creatinine Clearance: 60.3 mL/min (by C-G formula based on SCr of 0.83 mg/dL).   Mg 2.1 K 3.3 Na 138  No Known Allergies  Antimicrobials this admission: Cefepime/Vancomycin 3/6>3/8 Zosyn 3/8 >> 3/15  Ambisome 3/11>> Flucytosine 3/11>>  Dose adjustments this admission: None  Microbiology results: 3/12 CSF>> cryptococcus neoformans 3/6 Blood >>NG 3/7 MRSA pcr negative     Hiran Leard L. Devin Going, [place] PGY1 Pharmacy Resident [phone] 02/26/20      2:17 PM  Please check AMION for all [place] phone numbers After 10:00 PM, call the [facility] [phone]

## 2020-02-26 NOTE — Progress Notes (Signed)
Patient found in room, had slipped alarm belt over head. Was half way in bed, right foot hanging off of bed and left foot on top of bed side table. Obvious patient had tried to get in bed alone without calling for help. Patient was alert and oriented to self and place. No evidence of patient had fallen. Patient denies any pain at this time. No complications noted at this time. Continue plan of care.  Audie Clear, LPN
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
## 2020-02-26 NOTE — Progress Notes (Signed)
[facility] PHYSICAL MEDICINE & REHABILITATION PROGRESS NOTE   Subjective/Complaints:   ROS: Limited due to cognitive/behavioral    Objective:   No results found. Recent Labs    02/25/20 0430  WBC 7.0  HGB 11.1*  HCT 34.0*  PLT 231   Recent Labs    02/25/20 0430 02/26/20 0412  NA 138 138  K 3.5 3.3*  CL 105 103  CO2 25 26  GLUCOSE 135* 112*  BUN 24* 18  CREATININE 0.91 0.83  CALCIUM 9.3 9.0    Intake/Output Summary (Last 24 hours) at 02/26/2020 1126 Last data filed at 02/26/2020 0233 Gross per 24 hour  Intake 352 ml  Output 350 ml  Net 2 ml     Physical Exam: Vital Signs Blood pressure (!) 142/87, pulse 85, temperature 97.6 F (36.4 C), temperature source Oral, resp. rate 18, height 5\' 6"  (1.676 m), weight 53 kg, SpO2 100 %.  General: No acute distress Mood and affect are appropriate Heart: Regular rate and rhythm no rubs murmurs or extra sounds Lungs: Clear to auscultation, breathing unlabored, no rales or wheezes Abdomen: Positive bowel sounds, soft nontender to palpation, nondistended Extremities: No clubbing, cyanosis, or edema Skin: No evidence of breakdown, no evidence of rash Neurologic:  motor strength is 4/5 in bilateral deltoid, bicep, tricep, grip, hip flexor, knee extensors, ankle dorsiflexor and plantar flexor Oriented to person and Saturday but not place "[place]"  Musculoskeletal: Full range of motion in all 4 extremities. No joint swelling    Assessment/Plan: 1. 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  Physiatrist is providing close team supervision and 24 hour management of active medical problems listed below.  Physiatrist and rehab team continue to assess barriers to discharge/monitor patient progress toward functional and medical goals  Care Tool:  Bathing    Body  parts bathed by patient: Right arm, Left arm, Chest, Abdomen, Right upper leg, Left upper leg, Front perineal area, Face   Body parts bathed by helper: Left lower leg, Right lower leg, Buttocks     Bathing assist Assist Level: Moderate Assistance - Patient 50 - 74%     Upper Body Dressing/Undressing Upper body dressing   What is the patient wearing?: Pull over shirt    Upper body assist Assist Level: Maximal Assistance - Patient 25 - 49%    Lower Body Dressing/Undressing Lower body dressing      What is the patient wearing?: Incontinence brief, Pants     Lower body assist Assist for lower body dressing: Maximal Assistance - Patient 25 - 49%     Toileting Toileting    Toileting assist Assist for toileting: Maximal Assistance - Patient 25 - 49%     Transfers Chair/bed transfer  Transfers assist  Chair/bed transfer activity did not occur: Safety/medical concerns  Chair/bed transfer assist level: Moderate Assistance - Patient 50 - 74%     Locomotion Ambulation   Ambulation assist      Assist level: Maximal Assistance - Patient 25 - 49% Assistive device: Walker-rolling Max distance: 8 ft   Walk 10 feet activity   Assist  Walk 10 feet activity did not occur: Safety/medical concerns        Walk 50 feet activity   Assist Walk 50 feet with 2 turns activity did not occur: Safety/medical concerns         Walk 150 feet activity  Assist Walk 150 feet activity did not occur: Safety/medical concerns         Walk 10 feet on uneven surface  activity   Assist Walk 10 feet on uneven surfaces activity did not occur: Safety/medical concerns         Wheelchair     Assist               Wheelchair 50 feet with 2 turns activity    Assist            Wheelchair 150 feet activity     Assist          Blood pressure (!) 142/87, pulse 85, temperature 97.6 F (36.4 C), temperature source Oral, resp. rate 18, height 5\' 6"   (1.676 m), weight 53 kg, SpO2 100 %.  Medical Problem List and Plan: 1.  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 Has been gradually improving from chart. Observe today 2. DVT/PE/ Antithrombotics: -DVT/anticoagulation:  Pharmaceutical: Other (comment)--on Eliquis             -antiplatelet therapy: N/a 3. Pain Management: N/a 4. Mood: LCSW to monitor for evaluation and support when appropriate.              -antipsychotic agents: N/A 5. Neuropsych: This patient is not capable of making decisions on his own behalf. 6. Skin/Wound Care: routine pressure relief measures.  7. Fluids/Electrolytes/Nutrition: Monitor I/O. Check daily labs  -severe protein deficient malnutrition  -dietary supplements, RD following  -intake poor to inconsistent at present, might benefit from megace trial 8. Disseminated cryptococcal infection/meningoencephalitis: On Amphotericin B daily and Flucytosine every 6 hours--  daily BMP, Mg and K+ as well as periodic LFTs.     Per ID note repeat LP in IR ordered     -LFT's sl elevated--recheck Monday 9. Hyponatremia: 138 3/19 10. Myasthenia gravis: Has been off Cellcept since Covid. Prednisone decreased to 5 mg daily on 03/18 with plans to d/c over next few days. - WIFE wants pt's Neurologist- a Emergency planning/management officer on Myasthenia gravis called with any med changes- Dr Westley Foots- [phone]  11. Post Covid hypoxia: Has resolved--current O2 sats on room ar 99%- continue to monitor.   12. Hypokalemia: Due to meds--being supplemented intermittently.   3/19 3.5 13. Stage I pressure ulcer and skin tears-   routine skin care and call WOC if needed.    -appropriate turning  -improve nutrition as above  -foam  dressing.   .     LOS: 2 days A FACE TO FACE EVALUATION WAS PERFORMED  Charlett Blake 02/26/2020, 11:26 AM

## 2020-02-26 NOTE — Progress Notes (Signed)
Occupational Therapy Session Note  Patient Details  Name: Duane Santos MRN: [id number] Date of Birth: 05/29/1946  Today's Date: 02/26/2020 OT Individual Time: 4142-3953 OT Individual Time Calculation (min): 58 min   Short Term Goals: Week 1:  OT Short Term Goal 1 (Week 1): Pt will complete toilet transfer with min A OT Short Term Goal 2 (Week 1): Pt will complete LB dressing with mod A OT Short Term Goal 3 (Week 1): Pt will self-feed 50% of meal with min A.  Skilled Therapeutic Interventions/Progress Updates:    Pt greeted while sitting up in bed. RN was present, reporting that pt was needing assist to eat. OT sat with pt to supervise and assist as needed, minimizing stimulation in the environment as appropriate. Pt ate the last 50% of his meal with min cuing when given increased time to initiate and spear his food using the fork. Noted some external distractibility as well with OT removing clutter from tray and bedside table during meal as well. Pt asking several times where "Maudie Mercury" was, unable to state Kim's relation to him, but when OT asked if this was his wife he nodded. OT informed pt that she was most likely at home vs hospital. Pt asking why he was at the hospital and OT informed him, pt for remainder of session asking why Maudie Mercury was at the hospital, if she was ok and walking by herself. Educated pt at these times that he was at the hospital for recovery vs Kim. Noted delayed response time as well during conversations. Afterwards pt transitioned to EOB with Mod A. Mod A for stand pivot<w/c with increased time for motor planning. Min-Mod A for sit<stands at the sink and Mod A for balance while completing pericare and LB dressing tasks, pt presenting with posterior lean. Asked pt beforehand if his brief was soiled and he responded "no." Brief was found to be saturated with urine. Continued working on praxis, memory, and sustained attention during handwashing and UB dressing tasks afterwards while  seated. Pt remained in the w/c with all needs within reach and safety belt fastened.   Therapy Documentation Precautions:  Precautions Precautions: Fall Restrictions Weight Bearing Restrictions: No Vital Signs: Pain: Pt denied pain during tx Therapy Vitals Temp: 97.8 F (36.6 C) Temp Source: Oral Pulse Rate: 85 Resp: 18 BP: 126/72 Patient Position (if appropriate): Lying Oxygen Therapy SpO2: 100 % O2 Device: Room Air ADL: ADL Eating: Maximal assistance Grooming: Moderate assistance Upper Body Bathing: Moderate assistance Lower Body Bathing: Maximal assistance Upper Body Dressing: Moderate assistance Lower Body Dressing: Maximal assistance Toileting: Maximal assistance Toilet Transfer: Moderate verbal cueing      Therapy/Group: Individual Therapy  Khylah Kendra A Nylan Nakatani 02/26/2020, 4:20 PM

## 2020-02-26 NOTE — Progress Notes (Signed)
Called by 4W staff that IR requests a call back to verify he needs LP.   I called number for IR and left a message, no answer.   The patient needs a follow up LP to help determine response to treatment, verification his opening pressure is stable and glucose is normalizing to help determine length of induction therapy and verify further interventions such as shunting not required.   Thayer Headings, MD

## 2020-02-27 ENCOUNTER — Inpatient Hospital Stay (HOSPITAL_COMMUNITY): Payer: Medicare HMO

## 2020-02-27 ENCOUNTER — Inpatient Hospital Stay (HOSPITAL_COMMUNITY): Payer: Medicare HMO | Admitting: Speech Pathology

## 2020-02-27 ENCOUNTER — Inpatient Hospital Stay (HOSPITAL_COMMUNITY): Payer: Medicare HMO | Admitting: Occupational Therapy

## 2020-02-27 LAB — CBC
HCT: 32.7 % — ABNORMAL LOW (ref 39.0–52.0)
Hemoglobin: 10.7 g/dL — ABNORMAL LOW (ref 13.0–17.0)
MCH: 33.2 pg (ref 26.0–34.0)
MCHC: 32.7 g/dL (ref 30.0–36.0)
MCV: 101.6 fL — ABNORMAL HIGH (ref 80.0–100.0)
Platelets: 246 10*3/uL (ref 150–400)
RBC: 3.22 MIL/uL — ABNORMAL LOW (ref 4.22–5.81)
RDW: 15.7 % — ABNORMAL HIGH (ref 11.5–15.5)
WBC: 6.3 10*3/uL (ref 4.0–10.5)
nRBC: 0 % (ref 0.0–0.2)

## 2020-02-27 LAB — BASIC METABOLIC PANEL
Anion gap: 6 (ref 5–15)
BUN: 18 mg/dL (ref 8–23)
CO2: 24 mmol/L (ref 22–32)
Calcium: 9.1 mg/dL (ref 8.9–10.3)
Chloride: 107 mmol/L (ref 98–111)
Creatinine, Ser: 0.86 mg/dL (ref 0.61–1.24)
GFR calc Af Amer: >60 mL/min (ref >60)
GFR calc non Af Amer: >60 mL/min (ref >60)
Glucose, Bld: 109 mg/dL — ABNORMAL HIGH (ref 70–99)
Potassium: 3.9 mmol/L (ref 3.5–5.1)
Sodium: 137 mmol/L (ref 135–145)

## 2020-02-27 LAB — CSF CELL COUNT WITH DIFFERENTIAL
Eosinophils, CSF: 0 % (ref 0–1)
Lymphs, CSF: 82 % — ABNORMAL HIGH (ref 40–80)
Monocyte-Macrophage-Spinal Fluid: 15 % (ref 15–45)
RBC Count, CSF: 396 /mm3 — ABNORMAL HIGH
Segmented Neutrophils-CSF: 3 % (ref 0–6)
Tube #: 3
WBC, CSF: 104 /mm3 (ref 0–5)

## 2020-02-27 LAB — HEPARIN LEVEL (UNFRACTIONATED): Heparin Unfractionated: 1.26 IU/mL — ABNORMAL HIGH (ref 0.30–0.70)

## 2020-02-27 LAB — GLUCOSE, CSF: Glucose, CSF: 27 mg/dL — CL (ref 40–70)

## 2020-02-27 LAB — APTT: aPTT: 58 seconds — ABNORMAL HIGH (ref 24–36)

## 2020-02-27 LAB — MAGNESIUM: Magnesium: 2.1 mg/dL (ref 1.7–2.4)

## 2020-02-27 LAB — PROTEIN, CSF: Total  Protein, CSF: 196 mg/dL — ABNORMAL HIGH (ref 15–45)

## 2020-02-27 MED ORDER — HEPARIN (PORCINE) 25000 UT/250ML-% IV SOLN
800.0000 [IU]/h | INTRAVENOUS | Status: AC
  Administered 2020-02-27 – 2020-02-29 (×3): 800 [IU]/h via INTRAVENOUS
  Filled 2020-02-27: qty 250

## 2020-02-27 NOTE — Procedures (Signed)
Lumbar Puncture with Fluoro guidance  Using sterile technique and lidocaine local anesthetic, LP was performed at L3-4 with fluoro  guidance.  OP < 8 cm/H2O Obtained 6 cc of clear colorless fluid No apparent complications. Patient tolerated procedure well. Can restart heparin in 2 hours.

## 2020-02-27 NOTE — Progress Notes (Signed)
Occupational Therapy Session Note  Patient Details  Name: Duane Santos MRN: [id number] Date of Birth: 1946-03-17  Today's Date: 02/27/2020 OT Individual Time: 3154-0086 OT Individual Time Calculation (min): 57 min   Short Term Goals: Week 1:  OT Short Term Goal 1 (Week 1): Pt will complete toilet transfer with min A OT Short Term Goal 2 (Week 1): Pt will complete LB dressing with mod A OT Short Term Goal 3 (Week 1): Pt will self-feed 50% of meal with min A.  Skilled Therapeutic Interventions/Progress Updates:    Pt greeted in his w/c with no c/o pain. Declining participation in toileting, bathing, or dressing tasks. Pt began stroking hair on his neck, when OT asked if he wanted to shave, pt nodded and stated "yes." Worked on motor planning and sustained attention during shaving while seated at the sink. Pt externally distracted by items on sink counter, needed OT to pick out shaving cream in order for him to reach out for it. Min A to open shaving cream and max vcs for apply enough cream onto face. Note that pt perseverated on folding his wash cloth with the water running. He reached for the razor in OT's hand and initiated shaving correctly, but he was unable to apply adequate pressure to face to shave thoroughly, needing Max A from OT. Min vcs for sustained attention with positioning face to allow OT to shave. Pt once again with external distractibility at times, reaching for his wash cloth. Min vcs for washing hands afterwards and for completing oral care. Max A to dispense toothpaste onto toothbrush. Pt significantly perseverative with oral care, taking several times before initiating termination of task. When OT prompted pt with multimodal cues to finish sooner (as MD wanted to speak with him), pt would not. Pt remained in w/c at end of session with all needs within reach and safety belt fastened.    Therapy Documentation Precautions:  Precautions Precautions: Fall Restrictions Weight  Bearing Restrictions: No Pain: Pain Assessment Pain Scale: 0-10 Pain Score: 0-No pain ADL: ADL Eating: Maximal assistance Grooming: Moderate assistance Upper Body Bathing: Moderate assistance Lower Body Bathing: Maximal assistance Upper Body Dressing: Moderate assistance Lower Body Dressing: Maximal assistance Toileting: Maximal assistance Toilet Transfer: Moderate verbal cueing       Therapy/Group: Individual Therapy  Jakyren Fluegge A Chrisa Hassan 02/27/2020, 12:45 PM

## 2020-02-27 NOTE — Progress Notes (Signed)
Speech Language Pathology Daily Session Note  Patient Details  Name: ALTO GANDOLFO MRN: [id number] Date of Birth: Apr 02, 1946  Today's Date: 02/27/2020 SLP Individual Time: 6283-6629 SLP Individual Time Calculation (min): 55 min  Short Term Goals: Week 1: SLP Short Term Goal 1 (Week 1): Pt will sustain attention to basic task for 2 minutes with Max A cues. SLP Short Term Goal 2 (Week 1): Pt will follow 1 step direcitons in 3 out of 5 opportunities with Max A cues. SLP Short Term Goal 3 (Week 1): Pt will utilize external memory aids to recall orientation information with Max A cues. SLP Short Term Goal 4 (Week 1): With Max A cues, pt will demonstrate intellectual awareness by correctly answering yes/no questions related to physical and cognitive deficits. SLP Short Term Goal 5 (Week 1): Pt will initiate basic familiar task in 3 out of 5 opportunities with Max A cues.  Skilled Therapeutic Interventions:  Pt was seen for skilled ST targeting cognitive goals.  Upon arrival, pt was with nursing.  Pt had been incontinent and voided all over the bathroom floor.  SLP and NT assisted pt in hygiene and donning clean socks, gown, and brief, during which pt was able to follow one step commands with mod assist multimodal cues.  Pt needed max assist for task organization and cessation of task due to perseveration when brushing his teeth while seated at the sink.  Pt was able to reorient to place and date with mod assist verbal cues for use of external aids.  A calendar and signs were posted in pt's room to facilitate carryover of orientation information in between therapy sessions.  Pt was left in wheelchair with chair alarm set and call bell within reach.  Continue per current plan of care.    Pain Pain Assessment Pain Scale: 0-10 Pain Score: 0-No pain  Therapy/Group: Individual Therapy  Jamielyn Petrucci, Selinda Orion 02/27/2020, 9:59 AM

## 2020-02-27 NOTE — Progress Notes (Signed)
As per Drs  Owens Shark, [facility] radiology , ok to restart heparin 2 hours post procedure.  They had no recs for restarting Eliquis but looking at [place] [facility] guidelines, should be able to switch to Eliquis  24 hrs post procedure.

## 2020-02-27 NOTE — Progress Notes (Signed)
[place] PHYSICAL MEDICINE & REHABILITATION PROGRESS NOTE   Subjective/Complaints: Pt remains severely  Apraxic per OT  Discussed need for LP wit ID, pt on anticoagulation for DVT/PE treatment  Now on Hep drip with plans to hold x 2hr prior to LP   ROS: Limited due to cognitive/behavioral    Objective:   No results found. Recent Labs    02/25/20 0430 02/27/20 0146  WBC 7.0 6.3  HGB 11.1* 10.7*  HCT 34.0* 32.7*  PLT 231 246   Recent Labs    02/26/20 0412 02/27/20 0146  NA 138 137  K 3.3* 3.9  CL 103 107  CO2 26 24  GLUCOSE 112* 109*  BUN 18 18  CREATININE 0.83 0.86  CALCIUM 9.0 9.1    Intake/Output Summary (Last 24 hours) at 02/27/2020 1200 Last data filed at 02/27/2020 0348 Gross per 24 hour  Intake 240 ml  Output 775 ml  Net -535 ml     Physical Exam: Vital Signs Blood pressure (!) 146/86, pulse 87, temperature (!) 97.5 F (36.4 C), temperature source Oral, resp. rate 16, height 5\' 6"  (1.676 m), weight 53 kg, SpO2 99 %.    General: No acute distress Mood and affect are appropriate Heart: Regular rate and rhythm no rubs murmurs or extra sounds Lungs: Clear to auscultation, breathing unlabored, no rales or wheezes Abdomen: Positive bowel sounds, soft nontender to palpation, nondistended Extremities: No clubbing, cyanosis, or edema  Neurologic: Cranial nerves II through XII intact, motor strength is 545 in bilateral deltoid, bicep, tricep, grip, hip flexor, knee extensors, ankle dorsiflexor and plantar flexor Sensory exam normal sensation to light touch and proprioception in bilateral upper and lower extremities Cerebellar exam normal finger to nose to finger as well as heel to shin in bilateral upper and lower extremities  Musculoskeletal: Full range of motion in all 4 extremities. No joint swelling    Assessment/Plan: 1. 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  Physiatrist is providing close team supervision and 24 hour management of active medical problems listed below.  Physiatrist and rehab team continue to assess barriers to discharge/monitor patient progress toward functional and medical goals  Care Tool:  Bathing    Body parts bathed by patient: Right arm, Left arm, Chest, Abdomen, Right upper leg, Left upper leg, Front perineal area, Face   Body parts bathed by helper: Left lower leg, Right lower leg, Buttocks     Bathing assist Assist Level: Moderate Assistance - Patient 50 - 74%     Upper Body Dressing/Undressing Upper body dressing   What is the patient wearing?: Pull over shirt    Upper body assist Assist Level: Maximal Assistance - Patient 25 - 49%    Lower Body Dressing/Undressing Lower body dressing      What is the patient wearing?: Incontinence brief     Lower body assist Assist for lower body dressing: Maximal Assistance - Patient 25 - 49%     Toileting Toileting    Toileting assist Assist for toileting: Maximal Assistance - Patient 25 - 49%     Transfers Chair/bed transfer  Transfers assist  Chair/bed transfer activity did not occur: Safety/medical concerns  Chair/bed transfer assist level: Moderate Assistance - Patient 50 - 74%     Locomotion Ambulation   Ambulation assist      Assist level: Maximal Assistance - Patient 25 -  49% Assistive device: Walker-rolling Max distance: 8 ft   Walk 10 feet activity   Assist  Walk 10 feet activity did not occur: Safety/medical concerns        Walk 50 feet activity   Assist Walk 50 feet with 2 turns activity did not occur: Safety/medical concerns         Walk 150 feet activity   Assist Walk 150 feet activity did not occur: Safety/medical concerns         Walk 10 feet on uneven surface  activity   Assist Walk 10 feet on uneven surfaces activity did not  occur: Safety/medical concerns         Wheelchair     Assist               Wheelchair 50 feet with 2 turns activity    Assist            Wheelchair 150 feet activity     Assist          Blood pressure (!) 146/86, pulse 87, temperature (!) 97.5 F (36.4 C), temperature source Oral, resp. rate 16, height 5\' 6"  (1.676 m), weight 53 kg, SpO2 99 %.  Medical Problem List and Plan: 1.  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 Has been gradually improving from chart. Observe today 2. DVT/PE/ Antithrombotics: -DVT/anticoagulation:  Pharmaceutical: Other (comment)--on Eliquis- Now on Heparin IV prior to procedure , will need IR to advise when anticoag may be resumed             -antiplatelet therapy: N/a 3. Pain Management: N/a 4. Mood: LCSW to monitor for evaluation and support when appropriate.              -antipsychotic agents: N/A 5. Neuropsych: This patient is not capable of making decisions on his own behalf. 6. Skin/Wound Care: routine pressure relief measures.  7. Fluids/Electrolytes/Nutrition: Monitor I/O. Check daily labs  -severe protein deficient malnutrition  -dietary supplements, RD following  -intake poor to inconsistent at present, might benefit from megace trial 8. Disseminated cryptococcal infection/meningoencephalitis: On Amphotericin B daily and Flucytosine every 6 hours--  daily BMP, Mg and K+ as well as periodic LFTs.     Per ID note repeat LP in IR ordered  - hold heparin for 2h prior to LP   -LFT's sl elevated--recheck Monday 9. Hyponatremia: 138 3/19 10. Myasthenia gravis: Has been off Cellcept since Covid. Prednisone decreased to 5 mg daily on 03/18 with plans  to d/c over next few days. - WIFE wants pt's Neurologist- a Emergency planning/management officer on Myasthenia gravis called with any med changes- Dr Westley Foots- [phone]  11. Post Covid hypoxia: Has resolved--current O2 sats on room ar 99%- continue to monitor.   12. Hypokalemia: Due to meds--being supplemented intermittently.   3/19 3.5 13. Stage I pressure ulcer and skin tears-   routine skin care and call WOC if needed.    -appropriate turning  -improve nutrition as above  -foam dressing.   .     LOS: 3 days A FACE TO FACE EVALUATION WAS PERFORMED  Charlett Blake 02/27/2020, 12:00 PM

## 2020-02-27 NOTE — Progress Notes (Signed)
Pharmacy Antibiotic Note  Duane Santos is a 74 y.o. male admitted on 02/24/2020 with meningitis.  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  Assessment: 74 YOM with myasthenia gravis on prednisone/mycophenolate.  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  Started on amphotericin - liposomal and flucytosine evening on 3/11. He had LP and BAL performed 3/12 - CSF cx with yeast .  Transferred to inpatient rehab on 3/18.  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  Magnesium in the normal range, not on scheduled supplementation. Potassium 3.9, normal, on KCl 40 daily.   Plan:  - Continue KCl 40 mEq BID  - Daily BMet and Mg - Follow-up LP results planned for 3/22  Height: _0  (167.6 cm) Weight: 116 lb 13.5 oz (53 kg) IBW/kg (Calculated) : 63.8  Temp (24hrs), Avg:97.8 F (36.6 C), Min:97.5 F (36.4 C), Max:98 F (36.7 C)  Recent Labs  Lab 02/21/20 0615 02/22/20 0400 02/22/20 0401 02/23/20 0642 02/24/20 0303 02/25/20 0430 02/26/20 0412 02/27/20 0146  WBC 8.8  --  7.4 9.2  --  7.0  --  6.3  CREATININE 0.84   < >  --  0.81 0.91 0.91 0.83 0.86   < > = values in this interval not displayed.    Estimated Creatinine Clearance: 58.2 mL/min (by C-G formula based on SCr of 0.86 mg/dL).   Mg 2.1 K 3.9 Na 137  No Known Allergies  Antimicrobials this admission: Cefepime/Vancomycin 3/6>3/8 Zosyn 3/8 >> 3/15  Ambisome 3/11>> Flucytosine 3/11>>  Dose adjustments this admission: None  Microbiology results: 3/12 CSF>> cryptococcus neoformans 3/6 Blood >>NG 3/7 MRSA pcr negative     Chesky Heyer L. Devin Going, [place] PGY1 Pharmacy Resident [phone] 02/27/20      7:42 AM  Please check AMION for all [place] phone numbers After 10:00 PM, call the [place] [phone]

## 2020-02-27 NOTE — Progress Notes (Signed)
Patient had LP done today in radiology. Protein CSH 196 and Glucose 27, reported to Dr. Francene Boyers (infectious disease). No changes noted at this time. Continue plan of care.  Audie Clear, LPN
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
## 2020-02-28 ENCOUNTER — Inpatient Hospital Stay (HOSPITAL_COMMUNITY): Payer: Medicare HMO | Admitting: Occupational Therapy

## 2020-02-28 ENCOUNTER — Inpatient Hospital Stay (HOSPITAL_COMMUNITY): Payer: Medicare HMO | Admitting: Physical Therapy

## 2020-02-28 ENCOUNTER — Inpatient Hospital Stay (HOSPITAL_COMMUNITY): Payer: Medicare HMO | Admitting: Speech Pathology

## 2020-02-28 LAB — COMPREHENSIVE METABOLIC PANEL
ALT: 65 U/L — ABNORMAL HIGH (ref 0–44)
AST: 38 U/L (ref 15–41)
Albumin: 2.7 g/dL — ABNORMAL LOW (ref 3.5–5.0)
Alkaline Phosphatase: 76 U/L (ref 38–126)
Anion gap: 9 (ref 5–15)
BUN: 20 mg/dL (ref 8–23)
CO2: 24 mmol/L (ref 22–32)
Calcium: 9 mg/dL (ref 8.9–10.3)
Chloride: 105 mmol/L (ref 98–111)
Creatinine, Ser: 0.96 mg/dL (ref 0.61–1.24)
GFR calc Af Amer: >60 mL/min (ref >60)
GFR calc non Af Amer: >60 mL/min (ref >60)
Glucose, Bld: 121 mg/dL — ABNORMAL HIGH (ref 70–99)
Potassium: 3.6 mmol/L (ref 3.5–5.1)
Sodium: 138 mmol/L (ref 135–145)
Total Bilirubin: 0.7 mg/dL (ref 0.3–1.2)
Total Protein: 5.3 g/dL — ABNORMAL LOW (ref 6.5–8.1)

## 2020-02-28 LAB — CBC
HCT: 33.4 % — ABNORMAL LOW (ref 39.0–52.0)
Hemoglobin: 10.9 g/dL — ABNORMAL LOW (ref 13.0–17.0)
MCH: 32.9 pg (ref 26.0–34.0)
MCHC: 32.6 g/dL (ref 30.0–36.0)
MCV: 100.9 fL — ABNORMAL HIGH (ref 80.0–100.0)
Platelets: 264 10*3/uL (ref 150–400)
RBC: 3.31 MIL/uL — ABNORMAL LOW (ref 4.22–5.81)
RDW: 15.6 % — ABNORMAL HIGH (ref 11.5–15.5)
WBC: 5.5 10*3/uL (ref 4.0–10.5)
nRBC: 0 % (ref 0.0–0.2)

## 2020-02-28 LAB — HEPARIN LEVEL (UNFRACTIONATED)
Heparin Unfractionated: 0.99 IU/mL — ABNORMAL HIGH (ref 0.30–0.70)
Heparin Unfractionated: 1.1 IU/mL — ABNORMAL HIGH (ref 0.30–0.70)

## 2020-02-28 LAB — APTT
aPTT: 100 seconds — ABNORMAL HIGH (ref 24–36)
aPTT: 65 seconds — ABNORMAL HIGH (ref 24–36)
aPTT: 83 seconds — ABNORMAL HIGH (ref 24–36)

## 2020-02-28 LAB — MAGNESIUM: Magnesium: 1.9 mg/dL (ref 1.7–2.4)

## 2020-02-28 MED ORDER — MAGNESIUM SULFATE IN D5W 1-5 GM/100ML-% IV SOLN
1.0000 g | Freq: Once | INTRAVENOUS | Status: AC
  Administered 2020-02-28: 1 g via INTRAVENOUS
  Filled 2020-02-28: qty 100

## 2020-02-28 MED ORDER — POTASSIUM CHLORIDE CRYS ER 20 MEQ PO TBCR
20.0000 meq | EXTENDED_RELEASE_TABLET | Freq: Once | ORAL | Status: AC
  Administered 2020-02-28: 20 meq via ORAL
  Filled 2020-02-28: qty 1

## 2020-02-28 NOTE — Progress Notes (Addendum)
ANTICOAGULATION CONSULT NOTE - Follow Up Consult  Pharmacy Consult for heparin Indication: pulmonary embolus Jan 2021  Patient Measurements: Height: 5\' 6"  (167.6 cm) Weight: 116 lb 13.5 oz (53 kg) IBW/kg (Calculated) : 63.8 Heparin Dosing Weight: 53 kg  Vital Signs: Temp: 97.7 F (36.5 C) (03/22 2029) Temp Source: Oral (03/22 1452) BP: 111/71 (03/22 2029) Pulse Rate: 102 (03/22 2029)  Labs:  Recent Labs    02/26/20 0412 02/26/20 1527 02/26/20 1706 02/27/20 0146 02/27/20 0908 02/27/20 2342 02/28/20 0807  HGB  --   --   --  10.7*  --   --  10.9*  HCT  --   --   --  32.7*  --   --  33.4*  PLT  --   --   --  246  --   --  264  APTT  --    < >   < >  --  58* 65* 100*  HEPARINUNFRC  --    < >   < >  --  1.26* 0.99* 1.10*  CREATININE 0.83  --   --  0.86  --   --  0.96   < > = values in this interval not displayed.    Estimated Creatinine Clearance: 52.1 mL/min (by C-G formula based on SCr of 0.96 mg/dL).    Assessment: 74 yr old male presented with cryptococcal meningitis, with history of pulmonary embolism in January 2021. Patient was on apixaban 5 mg bid (last dose 0843 on 3/20) and transitioned to IV heparin for LP on 3/20.    Based on aPTT/heparin level from earlier today, apixaban is still having an effect on heparin level, so we are using aPTT to monitor anticoagulation at this time. aPTT ~7 hrs after heparin infusion was decreased to 800 units/hr earlier today is 83 sec, which is within the goal range for this pt. CBC stable. Per RN, no issues with IV or bleeding observed.  Of note, patient will need repeat LP near end of therapy in early April and may continue on heparin until then.    Goal of Therapy:  Heparin level 0.3-0.7 units/ml  APTT 66- 102 sec Monitor platelets by anticoagulation protocol: Yes   Plan:   Continue heparin at 800 units/hr Check aPTT, heparin level in 8 hours Monitor daily aPTT, heparin level, CBC Monitor for signs/symptoms of bleeding   Continue heparin until no further LPs needed  F/U eventual switch back to apixaban   Gillermina Hu, PharmD, BCPS, [facility] Clinical Pharmacist 02/28/20, 20:52 PM

## 2020-02-28 NOTE — Progress Notes (Signed)
Occupational Therapy Session Note  Patient Details  Name: Duane Santos MRN: [id number] Date of Birth: 02/20/1946  Today's Date: 02/28/2020 OT Individual Time: 1100-1200 OT Individual Time Calculation (min): 60 min    Short Term Goals: Week 1:  OT Short Term Goal 1 (Week 1): Pt will complete toilet transfer with min A OT Short Term Goal 2 (Week 1): Pt will complete LB dressing with mod A OT Short Term Goal 3 (Week 1): Pt will self-feed 50% of meal with min A.  Skilled Therapeutic Interventions/Progress Updates:    Pt missed initial 15 minutes of therapy 2/2 meeting with infectious disease NP. Pt greeted sitting in wc and agreeable to OT treatment session. Pt agreeable to get dressed this morning. OT asked pt if he needed to go to the bathroom first, and he stated "no". Pt brought to the sink in wc and completed sit<>stand with min A. Pt slightly less apraxic today and initiating commands quicker. Upon standing pt able to doff brief with min A from OT for peri-care. Pt then began urinating on the floor.  Pt stated he did not know he had to pee until he stood up. Pt with incontinence all over the floor. OT had pt finish washing buttocks and peri-area, then OT donned clean breif. PT returned to sitting and donned pants and clean socks using figure 4 position and min A.. Nursing entered to help don shirt with IV. Pt brought to therapy gym and worked on cognitive retraining and fine motor coordination with connect 4 activity. Pt stated he had never played game before. Pt was able to follow directions and pick up on rules of game quickly. Pt with good visual scanning and problem solving to block OT's moves and make appropriate moves on his own. Pt returned to room at end of session and left with alarm belt on, call bell in reach, and needs met.   Therapy Documentation Precautions:  Precautions Precautions: Fall Restrictions Weight Bearing Restrictions: No Pain:   denies pain  Therapy/Group:  Individual Therapy   S  02/28/2020, 11:26 AM 

## 2020-02-28 NOTE — Progress Notes (Addendum)
Pharmacy Antibiotic Note  Duane Santos is a 74 y.o. male admitted on 02/24/2020 with meningitis.  Pharmacy has been consulted for Ambisome and flucytosine (D#12) dosing for cryptococcal meningitis.  Assessment: 36 YOM with myasthenia gravis on prednisone/mycophenolate.  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  Started on amphotericin - liposomal and flucytosine evening on 3/11. He had LP and BAL performed 3/12 - CSF cx with yeast .  Transferred to inpatient rehab on 3/18.  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  Scr today is stable at 0.96. Would continue pre and post dose bolus fluids at 500 mL. Magnesium has dropped to 1.9 today and potassium has dropped to 3.6 on 40 mEQ daily. Will give a dose of Mg today and a small dose of potassium to bolster both levels.   Plan:  Continue amphotericin B 200 mg daily  Continue normal saline 500 mL pre and post dose for renal protection  Continue potassium 40 mEQ daily Give an extra 20 mEQ today to boost potassium Give mag sulfate 1 gm IV    Height: _0  (167.6 cm) Weight: 116 lb 13.5 oz (53 kg) IBW/kg (Calculated) : 63.8  Temp (24hrs), Avg:97.6 F (36.4 C), Min:97.4 F (36.3 C), Max:98 F (36.7 C)  Recent Labs  Lab 02/22/20 0400 02/22/20 0401 02/23/20 0642 02/24/20 0303 02/25/20 0430 02/26/20 0412 02/27/20 0146  WBC  --  7.4 9.2  --  7.0  --  6.3  CREATININE   < >  --  0.81 0.91 0.91 0.83 0.86   < > = values in this interval not displayed.    Estimated Creatinine Clearance: 58.2 mL/min (by C-G formula based on SCr of 0.86 mg/dL).    No Known Allergies  Antimicrobials this admission: Cefepime/Vancomycin 3/6>3/8 Zosyn 3/8 >> 3/15  Ambisome 3/11>> Flucytosine 3/11>>     Jimmy Footman, PharmD, BCPS, BCIDP Infectious Diseases Clinical Pharmacist Phone: [phone] 02/28/2020 11:00 AM

## 2020-02-28 NOTE — Progress Notes (Signed)
Speech Language Pathology Daily Session Note  Patient Details  Name: Duane Santos MRN: [id number] Date of Birth: 1946/07/06  Today's Date: 02/28/2020 SLP Individual Time: 1315-1355 SLP Individual Time Calculation (min): 40 min  Short Term Goals: Week 1: SLP Short Term Goal 1 (Week 1): Pt will sustain attention to basic task for 2 minutes with Max A cues. SLP Short Term Goal 2 (Week 1): Pt will follow 1 step direcitons in 3 out of 5 opportunities with Max A cues. SLP Short Term Goal 3 (Week 1): Pt will utilize external memory aids to recall orientation information with Max A cues. SLP Short Term Goal 4 (Week 1): With Max A cues, pt will demonstrate intellectual awareness by correctly answering yes/no questions related to physical and cognitive deficits. SLP Short Term Goal 5 (Week 1): Pt will initiate basic familiar task in 3 out of 5 opportunities with Max A cues.  Skilled Therapeutic Interventions: Skilled treatment session focused on cognitive goals. SLP facilitated session by providing extra time and Max A verbal and visual cues for use of external aids for orientation to place and time. SLP also facilitated session by providing extra time and Max A verbal cues for problem solving and sustained attention during a basic money management task. Patient left upright in the wheelchair with alarm on and all needs within reach. Continue with current plan of care.      Pain Pain Assessment Pain Scale: 0-10 Pain Score: 0-No pain  Therapy/Group: Individual Therapy  Frankey Botting 02/28/2020, 2:33 PM

## 2020-02-28 NOTE — Progress Notes (Signed)
[facility] PHYSICAL MEDICINE & REHABILITATION PROGRESS NOTE   Subjective/Complaints: Pt up with OT at sink. Brushing teeth. Would like longer to brush teeth as he spends a good amount of time on it at home (plus floss)  ROS: Patient denies fever, rash, sore throat, blurred vision, nausea, vomiting, diarrhea, cough, shortness of breath or chest pain, joint or back pain, headache, or mood change.   Objective:   DG FLUORO GUIDE LUMBAR PUNCTURE  Result Date: 02/27/2020 CLINICAL DATA:  Cryptococcal meningitis. EXAM: DIAGNOSTIC LUMBAR PUNCTURE UNDER FLUOROSCOPIC GUIDANCE FLUOROSCOPY TIME:  Fluoroscopy Time:  48 seconds Number of Acquired Spot Images: 3 PROCEDURE: Informed consent was obtained from the patient prior to the procedure, including potential complications of headache, allergy, and pain. With the patient prone, the lower back was prepped with Betadine. 1% Lidocaine was used for local anesthesia. Lumbar puncture was performed at the L3-4 level using a 20 gauge needle with return of clear colorless CSF with an opening pressure of less than 7 cm water. 6 ml of CSF were obtained for laboratory studies. The patient tolerated the procedure well and there were no apparent complications. IMPRESSION: Successful lumbar puncture with fluoroscopic guidance at L3-4. Electronically Signed   By: Nolon Nations M.D.   On: 02/27/2020 15:30   Recent Labs    02/27/20 0146 02/28/20 0807  WBC 6.3 5.5  HGB 10.7* 10.9*  HCT 32.7* 33.4*  PLT 246 264   Recent Labs    02/27/20 0146 02/28/20 0807  NA 137 138  K 3.9 3.6  CL 107 105  CO2 24 24  GLUCOSE 109* 121*  BUN 18 20  CREATININE 0.86 0.96  CALCIUM 9.1 9.0    Intake/Output Summary (Last 24 hours) at 02/28/2020 1134 Last data filed at 02/28/2020 6237 Gross per 24 hour  Intake 270 ml  Output --  Net 270 ml     Physical Exam: Vital Signs Blood pressure 132/86, pulse 87, temperature (!) 97.4 F (36.3 C), temperature source Oral, resp.  rate 19, height 5\' 6"  (1.676 m), weight 53 kg, SpO2 100 %.    Constitutional: No distress . Vital signs reviewed. HEENT: EOMI, oral membranes moist Neck: supple Cardiovascular: RRR without murmur. No JVD    Respiratory/Chest: CTA Bilaterally without wheezes or rales. Normal effort    GI/Abdomen: BS +, non-tender, non-distended Ext: no clubbing, cyanosis, or edema Psych: cooperative but flat Skin: foam dressing on sacrum Neurologic: oriented to person, hospital. Cranial nerves II through XII intact, motor strength is 4/5 in bilateral deltoid, bicep, tricep, grip, hip flexor, knee extensors, ankle dorsiflexor and plantar flexor. Senses pain in all 4's. More alert, still apraxic but seems to be processing better and more aware Musculoskeletal: Full range of motion in all 4 extremities. No joint swelling    Assessment/Plan: 1. 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  Physiatrist is providing close team supervision and 24 hour management of active medical problems listed below.  Physiatrist and rehab team continue to assess barriers to discharge/monitor patient progress toward functional and medical goals  Care Tool:  Bathing    Body parts bathed by patient: Right arm, Left arm, Chest, Abdomen, Right upper leg, Left upper leg, Front perineal area, Face   Body parts bathed by helper: Left lower leg, Right lower leg, Buttocks     Bathing assist Assist Level: Moderate Assistance - Patient 50 -  74%     Upper Body Dressing/Undressing Upper body dressing   What is the patient wearing?: Hospital gown only    Upper body assist Assist Level: Maximal Assistance - Patient 25 - 49%    Lower Body Dressing/Undressing Lower body dressing      What is the patient wearing?: Incontinence brief     Lower body assist Assist for  lower body dressing: Maximal Assistance - Patient 25 - 49%     Toileting Toileting    Toileting assist Assist for toileting: Maximal Assistance - Patient 25 - 49%     Transfers Chair/bed transfer  Transfers assist  Chair/bed transfer activity did not occur: Safety/medical concerns  Chair/bed transfer assist level: Moderate Assistance - Patient 50 - 74%     Locomotion Ambulation   Ambulation assist      Assist level: Maximal Assistance - Patient 25 - 49% Assistive device: Walker-rolling Max distance: 8 ft   Walk 10 feet activity   Assist  Walk 10 feet activity did not occur: Safety/medical concerns        Walk 50 feet activity   Assist Walk 50 feet with 2 turns activity did not occur: Safety/medical concerns         Walk 150 feet activity   Assist Walk 150 feet activity did not occur: Safety/medical concerns         Walk 10 feet on uneven surface  activity   Assist Walk 10 feet on uneven surfaces activity did not occur: Safety/medical concerns         Wheelchair     Assist               Wheelchair 50 feet with 2 turns activity    Assist            Wheelchair 150 feet activity     Assist          Blood pressure 132/86, pulse 87, temperature (!) 97.4 F (36.3 C), temperature source Oral, resp. rate 19, height 5\' 6"  (1.676 m), weight 53 kg, SpO2 100 %.  Medical Problem List and Plan: 1.  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 DVT/PE/ Antithrombotics: -DVT/anticoagulation:  Pharmaceutical: Other (comment)--on Eliquis- Now on Heparin IV since pre-procedure 3/21--continue pending repeat LP             -antiplatelet therapy: N/a 3. Pain Management:  N/a 4. Mood: LCSW to monitor for evaluation and support when appropriate.              -antipsychotic agents: N/A 5. Neuropsych: This patient is not capable of making decisions on his own behalf. 6. Skin/Wound Care: routine pressure relief measures.  7. Fluids/Electrolytes/Nutrition: Monitor I/O. Check daily labs  -severe protein deficient malnutrition  -dietary supplements, RD following  -intake poor to inconsistent at present, might benefit from megace trial 8. Disseminated cryptococcal infection/meningoencephalitis: On Amphotericin B daily and Flucytosine every 6 hours--  daily BMP, Mg and K+ as well as periodic LFTs.     3/22: Repeat LP 3/21: glucose 27, protein 145, wbc 104, cx pending  -repeat LP prior to discharge per ID  -plan for 28 days of iv  ampho (day 11 today)prior to change to long term fluconazole  -LFT's sl elevated but stable from 3/19 9. Hyponatremia: 138 3/19 10. Myasthenia gravis: Has been off Cellcept since Covid. Had been on prednisone long term prior to cellcept. Would not recommend tapering all the way off prednisone at this time. Continue 5mg  daily   - WIFE wants pt's Neurologist- a Emergency planning/management officer on Myasthenia gravis called with any med changes- Dr Westley Foots- [phone]  11. Post Covid hypoxia: Has resolved--current O2 sats on room ar 99%- continue to monitor.   12. Hypokalemia: Due to meds--being supplemented intermittently.   3/19 3.5 13. Stage I pressure ulcer and skin tears-   routine skin care and call WOC if needed.    -appropriate turning  -improve nutrition as above  -continue foam dressing.   .     LOS: 4 days A FACE TO FACE EVALUATION WAS PERFORMED  [name] 02/28/2020, 11:34 AM

## 2020-02-28 NOTE — Progress Notes (Signed)
Occupational Therapy Session Note  Patient Details  Name: Duane Santos MRN: [id number] Date of Birth: 1946-06-14  Today's Date: 02/28/2020 OT Individual Time: 5726-2035 OT Individual Time Calculation (min): 30 min    Short Term Goals: Week 1:  OT Short Term Goal 1 (Week 1): Pt will complete toilet transfer with min A OT Short Term Goal 2 (Week 1): Pt will complete LB dressing with mod A OT Short Term Goal 3 (Week 1): Pt will self-feed 50% of meal with min A.  Skilled Therapeutic Interventions/Progress Updates:    Upon entering the room, pt supine in bed and oriented to self and month only. Door to room closed to decrease distractions during session. Pt reports urgent need for toileting. Supine >sit with min A to EOB and pt utilizing urinal with min A for clothing management. Pt is then unable to void. Pt transfers into wheelchair with min A stand pivot transfer. Pt then reports , " Ohh I really need to go and I can't hold it." OT placing urinal and pt holding it himself and able to void this time. Pt fastens brief back with min cuing. Pt seated in wheelchair at sink for grooming tasks with mod A for sequencing, initiation, and attention to task. Pt remains in wheelchair with chair alarm belt donned and call bell within reach.   Therapy Documentation Precautions:  Precautions Precautions: Fall Restrictions Weight Bearing Restrictions: No General: General OT Amount of Missed Time: 15 Minutes Vital Signs:  Pain: Pain Assessment Pain Scale: 0-10 Pain Score: 0-No pain ADL: ADL Eating: Maximal assistance Grooming: Moderate assistance Upper Body Bathing: Moderate assistance Lower Body Bathing: Maximal assistance Upper Body Dressing: Moderate assistance Lower Body Dressing: Maximal assistance Toileting: Maximal assistance Toilet Transfer: Moderate verbal cueing Vision   Perception    Praxis   Exercises:   Other Treatments:     Therapy/Group: Individual  Therapy  Gypsy Decant 02/28/2020, 12:46 PM

## 2020-02-28 NOTE — Progress Notes (Signed)
[facility] for Infectious Disease  Date of Admission:  02/24/2020      Total days of antibiotics 16   Day 11 Flucytosine and L-Ampho    ASSESSMENT: Duane Santos is a 74 y.o. 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 He is HIV negative but on chronic therapy with Prednisone / Mycophenolate for myasthenia gravis and s/p COVID infection with tocilizumab therapy in January.  Currently on day 11 of induction therapy --> minimum would be 28 days prior to conversion to long term fluconazole therapy thereafter.  Still with some confusion / disorientation although seems better. Can tell me he is in the hospital and details about himself and his family. No pulmonary symptoms presently and on room air with adequate oxygen saturations.  Glucose still 27 with recent LP yesterday. While his opening pressures are low he would likely benefit from repeating LP again nearing the end of therapy to assess improvement with glucose/protein and repeat cryptococcal Ag titer (was very high @ 1:2560). Further recs to follow.   Continue aggressive electrolyte replacement / monitoring while on L-Ampho.   Unclear discharge plan - L-Ampho higher risk medication for home use due to risks with severe electrolyte derangements.   Anticoagulation plans to include Eliquis.     PLAN: 1. Continue L-ampho and flucytosine 2. Continue aggressive renal and electrolyte monitoring 3. Recommendations on timing of further LPs to follow     Principal Problem:   Cryptococcal meningitis ([facility]) Active Problems:   Myasthenia gravis (HCC)   Encephalitis   Severe malnutrition (HCC)   Stage I pressure ulcer of sacral region   Protein-calorie malnutrition, severe   . butamben-tetracaine-benzocaine  1 spray Topical Once  . Chlorhexidine Gluconate Cloth  6 each Topical Daily  . dextrose  10 mL Intravenous Q24H  . dextrose  10 mL Intravenous Q24H  .  feeding supplement (ENSURE ENLIVE)  237 mL Oral QID  . feeding supplement (PRO-STAT SUGAR FREE 64)  30 mL Oral BID  . flucytosine  25 mg/kg Oral Q6H  . multivitamin with minerals  1 tablet Oral Daily  . potassium chloride  40 mEq Oral Daily  . predniSONE  5 mg Oral Q breakfast  . sodium chloride  500 mL Intravenous Q24H  . sodium chloride  500 mL Intravenous Q24H  . sodium chloride  500 mL Intravenous Q24H  . sodium chloride flush  10-40 mL Intracatheter Q12H  . sodium chloride  1 g Oral TID WC  . thiamine  100 mg Oral Daily    SUBJECTIVE: Unaware of the date or year. Can tell me his wife's name and roughly how long they were married. Recalls eggs were on his breakfast tray today. Can tell me when his birthday is but not the year.   Does have overall improvement with bi-temporal headaches. No vision or hearing changes.   CSF 3/11 with Cryptococcal Ag titer 1:2560  OP 9 cm water, Glucose 21, Protein 254, WBC 72 >> 62, Cx positive for growth  CSF 3/21 with OP 7 cm water, Glucose 27, Protein 145, WBC 104 (82% lymphs); cultures pending  Cryptococcus neoformans in sputum as well  HIV Ab negative    Review of Systems: Review of Systems  Constitutional: Negative for chills and fever.  HENT: Negative for tinnitus.   Eyes: Negative for blurred vision and photophobia.  Respiratory: Negative for cough and sputum production.   Cardiovascular: Negative for chest pain.  Gastrointestinal: Negative for diarrhea, nausea and vomiting.  Genitourinary: Negative for dysuria.  Skin: Negative for rash.  Neurological: Negative for headaches.    No Known Allergies  OBJECTIVE: Vitals:   02/27/20 0345 02/27/20 1409 02/27/20 2101 02/28/20 0507  BP: (!) 146/86 97/78 121/74 132/86  Pulse: 87 99 85 87  Resp: 16 20 18 19   Temp: (!) 97.5 F (36.4 C) 98 F (36.7 C) (!) 97.5 F (36.4 C) (!) 97.4 F (36.3 C)  TempSrc: Oral Oral Oral Oral  SpO2: 99% 100% 100% 100%  Weight:      Height:        Body mass index is 18.86 kg/m.  Physical Exam Vitals and nursing note reviewed.  Constitutional:      Appearance: He is well-developed.     Comments: Seated comfortably in chair during visit.   HENT:     Mouth/Throat:     Mouth: Mucous membranes are moist.     Dentition: Normal dentition. No dental abscesses.     Pharynx: No oropharyngeal exudate.  Cardiovascular:     Rate and Rhythm: Normal rate and regular rhythm.     Heart sounds: Normal heart sounds.  Pulmonary:     Effort: Pulmonary effort is normal.     Breath sounds: Normal breath sounds.  Abdominal:     General: There is no distension.     Palpations: Abdomen is soft.     Tenderness: There is no abdominal tenderness.  Musculoskeletal:        General: Normal range of motion.  Lymphadenopathy:     Cervical: No cervical adenopathy.  Skin:    General: Skin is warm and dry.     Findings: No rash.  Neurological:     Mental Status: He is alert. He is disoriented.     Comments: Generalized weakness. Able to follow commands. Equal movement/strength bilaterally.   Psychiatric:        Judgment: Judgment normal.     Comments: In good spirits today      Lab Results Lab Results  Component Value Date   WBC 5.5 02/28/2020   HGB 10.9 (L) 02/28/2020   HCT 33.4 (L) 02/28/2020   MCV 100.9 (H) 02/28/2020   PLT 264 02/28/2020    Lab Results  Component Value Date   CREATININE 0.96 02/28/2020   BUN 20 02/28/2020   NA 138 02/28/2020   K 3.6 02/28/2020   CL 105 02/28/2020   CO2 24 02/28/2020    Lab Results  Component Value Date   ALT 65 (H) 02/28/2020   AST 38 02/28/2020   ALKPHOS 76 02/28/2020   BILITOT 0.7 02/28/2020     Microbiology: Recent Results (from the past 240 hour(s))  Culture, fungus without smear     Status: Abnormal (Preliminary result)   Collection Time: 02/18/20 10:02 AM   Specimen: Lung; Cerebrospinal Fluid  Result Value Ref Range Status   Specimen Description   Final    LUNG Performed at  [facility], [address], [place], [place] [address]    Special Requests   Final    LLL Performed at [facility], [address] [place], [place] [address]    Culture CRYPTOCOCCUS NEOFORMANS (A)  Final   Report Status PENDING  Incomplete  Fungus Culture With Stain     Status: None (Preliminary result)   Collection Time: 02/18/20 10:02 AM   Specimen: Lung, Right  Result Value Ref Range Status   Fungus Stain Final report  Final  Comment: (NOTE) Performed At: [facility] [place], [place] [id number] Rush Farmer MD [id number]    Fungus (Mycology) Culture PENDING  Incomplete   Fungal Source BRONCHIAL ALVEOLAR LAVAGE  Final    Comment: Performed at [facility], [address] [place], [place] [address]  Fungus Culture Result     Status: None   Collection Time: 02/18/20 10:02 AM  Result Value Ref Range Status   Result 1 Comment  Final    Comment: (NOTE) KOH/Calcofluor preparation:  no fungus observed. Performed At: [facility] [place], [place] [id number] Rush Farmer MD [id number]   CSF culture     Status: Abnormal   Collection Time: 02/18/20  2:51 PM   Specimen: CSF; Cerebrospinal Fluid  Result Value Ref Range Status   Specimen Description   Final    CSF Performed at [facility], [address], [place], [place] [address]    Special Requests   Final    NONE Performed at [facility], [place], [place] [address]    Gram Stain (A)  Final    [facility] CRITICAL RESULT CALLED TO, READ BACK BY AND VERIFIED WITH: SERENITY KIRKENDALL AT 1600 02/18/20.PMF WBC SEEN RED BLOOD CELLS PRESENT Performed at [facility], [address] [place], [place] [address]    Culture CRYPTOCOCCUS NEOFORMANS (A)  Final   Report Status 02/24/2020 FINAL  Final  CSF culture     Status: None (Preliminary result)   Collection Time: 02/27/20  1:30 PM   Specimen:  PATH Cytology CSF; Cerebrospinal Fluid  Result Value Ref Range Status   Specimen Description CSF  Final   Special Requests NONE  Final   Gram Stain   Final    WBC PRESENT, PREDOMINANTLY MONONUCLEAR NO ORGANISMS SEEN CYTOSPIN SMEAR    Culture   Final    NO GROWTH < 24 HOURS Performed at [facility], [place] [address], [place], [place] [address]    Report Status PENDING  Incomplete     Janene Madeira, MSN, NP-C [facility] for Infectious Disease [email or web address] Pager: [phone] Office: [phone] [place]: [phone]

## 2020-02-28 NOTE — Progress Notes (Signed)
[facility] for heparin Indication: pulmonary embolus   Assessment: 74y.o. 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 Patient has been treated with apixaban 5mg  bid (last dose 0843 on 3/20) and transitioned to heparin for LP. Pharmacy has now been consulted for IV heparin dosing.  Patient had LP today at ~1350. Heparin drip resumed.  APTT 65 sec, HL 0.99  Goal of Therapy:  Heparin level 0.3-0.7 units/ml Monitor platelets by anticoagulation protocol: Yes  Plan:  Increase heparin infusion to 850 units/hr Check aPTT and HL later today Monitor CBC, monitor for signs/symptoms of bleeding; daily aPTT/HL while on heparin F/u plans for transition back to oral anticoagulant after procedure next week  Thanks for allowing pharmacy to be a part of this patient's care.  Excell Seltzer, PharmD Clinical Pharmacist

## 2020-02-28 NOTE — Progress Notes (Signed)
Physical Therapy Session Note  Patient Details  Name: Duane Santos MRN: [id number] Date of Birth: 01-23-1946  Today's Date: 02/28/2020 PT Individual Time: 5361-4431 PT Individual Time Calculation (min): 43 min   Short Term Goals: Week 1:  PT Short Term Goal 1 (Week 1): Pt will complete bed<>w/c transfers with LRAD & CGA. PT Short Term Goal 2 (Week 1): Pt will ambulate 75 ft with LRAD & CGA. PT Short Term Goal 3 (Week 1): Pt will negotiate 8 steps with B rails with min assist.  Skilled Therapeutic Interventions/Progress Updates:  Pt received in w/c reporting he doesn't need to participate in therapy as "they cleared me and I'm going". Pt required max cuing to locate & use signs on walls as reminders for location & date as [place] (basketball game on TV).  Pt positioned at sink & transfers sit<>stand with min assist to allow therapist to check brief - pt clean. Transported pt to gym via w/c dependent assist for time management. Sit<>stand with min assist & pt ambulates 100 ft with min assist + 2nd person managing IV pole with pt requiring multimodal cuing for increased step length LLE & cuing to ambulate within base of RW. After gait HR = 101 bpm, SpO2 = 100% on room air. Pt performs blocked practice of sit<>stand with RW & BUE support with cuing for hand placement. Attempted 10x sit<>stand without BUE support but pt only able to complete 6 reps before fatigue with pt requiring min increasing to mod assist for transfer and pt demonstrating posterior lean in standing; after 6 reps HR = 105 bpm, SpO2 = 100% on room air but pt with heavy breathing & reporting he's "tired". After resting pt then performs additional 5 repetitions of sit<>stand without BUE support with mod assist with cuing for increased weight shifting. Pt transfers mat>w/c with RW & min assist with poor safety awareness & plopping into w/c seat with therapist providing cuing for  hand placement and safety with stand pivot transfers. Pt returned to room & transfers w/c>bed via stand pivot with RW & min assist with much improvement in sequencing & hand placement & overall safety. Pt instructed to lie down in bed & lays straight back with therapist providing max cuing to lay head on pillow & place BLE into bed with pt then able to do so with close supervision, cuing to straighten in bed & extra time. Pt left in bed with alarm set & call bell in reach (reviewed use of call bell with pt prior to exiting).  Therapy Documentation Precautions:  Precautions Precautions: Fall Restrictions Weight Bearing Restrictions: No  Pain: "my back & neck are hurting a little" but unrated pain, rest breaks provided PRN.   Therapy/Group: Individual Therapy  [name] 02/28/2020, 3:45 PM

## 2020-02-28 NOTE — Progress Notes (Signed)
ANTICOAGULATION CONSULT NOTE - Initial Consult  Pharmacy Consult for heparin Indication: pulmonary embolus Jan 2021  Patient Measurements: Height: 5\' 6"  (167.6 cm) Weight: 116 lb 13.5 oz (53 kg) IBW/kg (Calculated) : 63.8 Heparin Dosing Weight: 53 kg  Vital Signs: Temp: 97.4 F (36.3 C) (03/22 0507) Temp Source: Oral (03/22 0507) BP: 132/86 (03/22 0507) Pulse Rate: 87 (03/22 0507)  Labs:  Recent Labs    02/26/20 0412 02/26/20 1527 02/26/20 1706 02/27/20 0146 02/27/20 0908 02/27/20 2342 02/28/20 0807  HGB  --   --   --  10.7*  --   --  10.9*  HCT  --   --   --  32.7*  --   --  33.4*  PLT  --   --   --  246  --   --  264  APTT  --    < >   < >  --  58* 65* 100*  HEPARINUNFRC  --    < >   < >  --  1.26* 0.99* 1.10*  CREATININE 0.83  --   --  0.86  --   --  0.96   < > = values in this interval not displayed.    Estimated Creatinine Clearance: 52.1 mL/min (by C-G formula based on SCr of 0.96 mg/dL).    Assessment:  74y.o. 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 Patient was on apixaban 5mg  bid (last dose 0843 on 3/20) and transitioned to heparin for LP on 3/20.    APTT therapeutic at upper end of goal. HL is not correlating yet with aPTT. Per RN, heparin is running via right PIV and IV team drew level from left PICC line. No bleeding. H/H, plt stable. Given patient's low weight, small rate adjustment of 50 units/hr might have a larger effect on aPTT than expected. Will decrease back to 800 units/hr and see if can maintain in range since has had time to be at steady state. Of note, patient will need repeat LP near end of therapy in early April and may continue on heparin until then.    Goal of Therapy:  Heparin level 0.3-0.7 units/ml  APTT 66- 102 sec Monitor platelets by anticoagulation protocol: Yes   Plan:   Decrease heparin 800 units/hr F/u aPTT until correlates with heparin level  Monitor daily aptt, HL,  CBC/plt Monitor for signs/symptoms of bleeding  Continue heparin until no further LPs needed  F/u eventual switch back to apixaban   Benetta Spar, PharmD, BCPS, [facility] Clinical Pharmacist  Please check AMION for all [facility] phone numbers After 10:00 PM, call [facility]

## 2020-02-29 ENCOUNTER — Inpatient Hospital Stay (HOSPITAL_COMMUNITY): Payer: Medicare HMO | Admitting: Physical Therapy

## 2020-02-29 ENCOUNTER — Inpatient Hospital Stay (HOSPITAL_COMMUNITY): Payer: Medicare HMO | Admitting: Occupational Therapy

## 2020-02-29 ENCOUNTER — Inpatient Hospital Stay (HOSPITAL_COMMUNITY): Payer: Medicare HMO | Admitting: Speech Pathology

## 2020-02-29 DIAGNOSIS — G4700 Insomnia, unspecified: Secondary | ICD-10-CM

## 2020-02-29 DIAGNOSIS — F419 Anxiety disorder, unspecified: Secondary | ICD-10-CM

## 2020-02-29 LAB — BASIC METABOLIC PANEL
Anion gap: 10 (ref 5–15)
BUN: 21 mg/dL (ref 8–23)
CO2: 22 mmol/L (ref 22–32)
Calcium: 9.2 mg/dL (ref 8.9–10.3)
Chloride: 103 mmol/L (ref 98–111)
Creatinine, Ser: 1.04 mg/dL (ref 0.61–1.24)
GFR calc Af Amer: >60 mL/min (ref >60)
GFR calc non Af Amer: >60 mL/min (ref >60)
Glucose, Bld: 101 mg/dL — ABNORMAL HIGH (ref 70–99)
Potassium: 4 mmol/L (ref 3.5–5.1)
Sodium: 135 mmol/L (ref 135–145)

## 2020-02-29 LAB — CBC
HCT: 32 % — ABNORMAL LOW (ref 39.0–52.0)
Hemoglobin: 10.5 g/dL — ABNORMAL LOW (ref 13.0–17.0)
MCH: 32.8 pg (ref 26.0–34.0)
MCHC: 32.8 g/dL (ref 30.0–36.0)
MCV: 100 fL (ref 80.0–100.0)
Platelets: 257 10*3/uL (ref 150–400)
RBC: 3.2 MIL/uL — ABNORMAL LOW (ref 4.22–5.81)
RDW: 15.8 % — ABNORMAL HIGH (ref 11.5–15.5)
WBC: 6.3 10*3/uL (ref 4.0–10.5)
nRBC: 0 % (ref 0.0–0.2)

## 2020-02-29 LAB — MAGNESIUM: Magnesium: 2 mg/dL (ref 1.7–2.4)

## 2020-02-29 LAB — GLUCOSE, CAPILLARY: Glucose-Capillary: 122 mg/dL — ABNORMAL HIGH (ref 70–99)

## 2020-02-29 LAB — APTT: aPTT: 70 seconds — ABNORMAL HIGH (ref 24–36)

## 2020-02-29 LAB — PATHOLOGIST SMEAR REVIEW

## 2020-02-29 LAB — HEPARIN LEVEL (UNFRACTIONATED): Heparin Unfractionated: 0.76 IU/mL — ABNORMAL HIGH (ref 0.30–0.70)

## 2020-02-29 MED ORDER — SODIUM CHLORIDE 0.9 % IV BOLUS FOR AMBISOME
500.0000 mL | INTRAVENOUS | Status: DC
  Administered 2020-02-29 – 2020-03-14 (×15): 500 mL via INTRAVENOUS

## 2020-02-29 MED ORDER — APIXABAN 5 MG PO TABS
5.0000 mg | ORAL_TABLET | Freq: Two times a day (BID) | ORAL | Status: DC
  Administered 2020-02-29 – 2020-03-15 (×30): 5 mg via ORAL
  Filled 2020-02-29 (×10): qty 1
  Filled 2020-02-29: qty 2
  Filled 2020-02-29 (×19): qty 1

## 2020-02-29 MED ORDER — SODIUM CHLORIDE 0.9 % IV BOLUS FOR AMBISOME
1000.0000 mL | INTRAVENOUS | Status: DC
  Administered 2020-03-01 – 2020-03-05 (×6): 1000 mL via INTRAVENOUS

## 2020-02-29 NOTE — Progress Notes (Signed)
Pharmacy Antibiotic Note  Duane Santos is a 74 y.o. male admitted on 02/24/2020 with meningitis.  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  Assessment: 28 YOM with myasthenia gravis on prednisone/mycophenolate.  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  Started on amphotericin - liposomal and flucytosine evening on 3/11. He had LP and BAL performed 3/12 - CSF cx with yeast .  Transferred to inpatient rehab on 3/18.  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  Scr today is relatively stable at 1.04. Would continue pre-dose 500 mL bolus and post-dose 1000 mL bolus fluids. Magnesium is WNL at 2.0 today after supplementation, and potassium improved to 4.0 after additional supplementation yesterday and maintenance dose 40 mEQ daily.  Plan:  Continue amphotericin B 200 mg IV daily  Continue normal saline 500 mL pre-dose and 1000 mL post-dose for renal protection Continue potassium 40 mEQ PO daily Monitor renal function and electrolytes   Height: _0  (167.6 cm) Weight: 116 lb 13.5 oz (53 kg) IBW/kg (Calculated) : 63.8  Temp (24hrs), Avg:97.6 F (36.4 C), Min:97.4 F (36.3 C), Max:97.7 F (36.5 C)  Recent Labs  Lab 02/23/20 0642 02/24/20 0303 02/25/20 0430 02/26/20 0412 02/27/20 0146 02/28/20 0807 02/29/20 0504  WBC 9.2  --  7.0  --  6.3 5.5 6.3  CREATININE 0.81   < > 0.91 0.83 0.86 0.96 1.04   < > = values in this interval not displayed.    Estimated Creatinine Clearance: 48.1 mL/min (by C-G formula based on SCr of 1.04 mg/dL).    No Known Allergies  Antimicrobials this admission: Cefepime/Vancomycin 3/6>3/8 Zosyn 3/8 >> 3/15  Ambisome 3/11>> Flucytosine 3/11>>   Arturo Morton, PharmD, BCPS Please check AMION for all [place] contact numbers Clinical Pharmacist 02/29/2020 8:45 AM

## 2020-02-29 NOTE — Progress Notes (Signed)
Physical Therapy Session Note  Patient Details  Name: Duane Santos MRN: [id number] Date of Birth: [birth year]  Today's Date: 02/29/2020 PT Individual Time: 8099-8338 PT Individual Time Calculation (min): 72 min   Short Term Goals: Week 1:  PT Short Term Goal 1 (Week 1): Pt will complete bed<>w/c transfers with LRAD & CGA. PT Short Term Goal 2 (Week 1): Pt will ambulate 75 ft with LRAD & CGA. PT Short Term Goal 3 (Week 1): Pt will negotiate 8 steps with B rails with min assist.  Skilled Therapeutic Interventions/Progress Updates:  Pt received in room in care of NT with NT assisting with changing brief & pt able to stand with BUE support on RW & supervision. Pt handed off to PT. Pt oriented to [place], [place] today. Transported pt to/from gym via w/c dependent assist for time management. Sit<>stand with min assist with ongoing max cuing for hand placement to push up on armrests, max cuing to turn inside RW to square up to seat before sitting. Gait x 100 ft with more normalized gait pattern and equal step length BLE with min assist with mod/max cuing to ambulate within base of AD. Pt reports need to void so returned to room & pt ambulates in/out of bathroom with RW & min assist, manages clothing with min assist for standing balance and completes toilet transfer with min assist. Pt with continent void on toilet with significantly extra time. Pt requires max cuing to ambulate up to sink & min assist for standing balance to perform hand hygiene. In gym, pt engages in reaching across midline and low to retreive then toss horseshoes without BUE support with task focusing on standing balance with pt requiring min assist. Pt then engages in kicking soccer ball with RUE HHA and min assist for balance with task focusing on BLE coordination, weight shifting, and dynamic balance. During standing pt demonstrates increased anterior pelvic shift and posterior trunk. Pt requires rest breaks 2/2  fatigue. Back in room pt ambulates to bed with RW & min assist, and transitions sit>supine with supervision. Pt left in bed with alarm set & all needs at hand.  Pt also oriented to year today.  Therapy Documentation Precautions:  Precautions Precautions: Fall Restrictions Weight Bearing Restrictions: No    Pain: No c/o pain.  Therapy/Group: Individual Therapy  [name] 02/29/2020, 2:19 PM

## 2020-02-29 NOTE — Progress Notes (Signed)
[facility] PHYSICAL MEDICINE & REHABILITATION PROGRESS NOTE   Subjective/Complaints: Pt up with OT at sink. Brushing teeth. Would like longer to brush teeth as he spends a good amount of time on it at home (plus floss)  ROS: Patient denies fever, rash, sore throat, blurred vision, nausea, vomiting, diarrhea, cough, shortness of breath or chest pain, joint or back pain, headache, or mood change.   Objective:   DG FLUORO GUIDE LUMBAR PUNCTURE  Result Date: 02/27/2020 CLINICAL DATA:  Cryptococcal meningitis. EXAM: DIAGNOSTIC LUMBAR PUNCTURE UNDER FLUOROSCOPIC GUIDANCE FLUOROSCOPY TIME:  Fluoroscopy Time:  48 seconds Number of Acquired Spot Images: 3 PROCEDURE: Informed consent was obtained from the patient prior to the procedure, including potential complications of headache, allergy, and pain. With the patient prone, the lower back was prepped with Betadine. 1% Lidocaine was used for local anesthesia. Lumbar puncture was performed at the L3-4 level using a 20 gauge needle with return of clear colorless CSF with an opening pressure of less than 7 cm water. 6 ml of CSF were obtained for laboratory studies. The patient tolerated the procedure well and there were no apparent complications. IMPRESSION: Successful lumbar puncture with fluoroscopic guidance at L3-4. Electronically Signed   By: Nolon Nations M.D.   On: 02/27/2020 15:30   Recent Labs    02/28/20 0807 02/29/20 0504  WBC 5.5 6.3  HGB 10.9* 10.5*  HCT 33.4* 32.0*  PLT 264 257   Recent Labs    02/28/20 0807 02/29/20 0504  NA 138 135  K 3.6 4.0  CL 105 103  CO2 24 22  GLUCOSE 121* 101*  BUN 20 21  CREATININE 0.96 1.04  CALCIUM 9.0 9.2    Intake/Output Summary (Last 24 hours) at 02/29/2020 1315 Last data filed at 02/29/2020 1034 Gross per 24 hour  Intake 2444 ml  Output 395 ml  Net 2049 ml     Physical Exam: Vital Signs Blood pressure 121/73, pulse 92, temperature (!) 97.5 F (36.4 C), resp. rate 20, height 5\' 6"   (1.676 m), weight 53 kg, SpO2 100 %.    Constitutional: No distress . Vital signs reviewed. HEENT: EOMI, oral membranes moist Neck: supple Cardiovascular: RRR without murmur. No JVD    Respiratory/Chest: CTA Bilaterally without wheezes or rales. Normal effort    GI/Abdomen: BS +, non-tender, non-distended Ext: no clubbing, cyanosis, or edema Psych: cooperative but flat Skin: foam dressing on sacrum, stable appearance Neurologic: oriented to person, hospital. Cranial nerves II through XII intact, motor strength is 4/5 in bilateral deltoid, bicep, tricep, grip, hip flexor, knee extensors, ankle dorsiflexor and plantar flexor. Senses pain in all 4's. More alert, still apraxic but seems to be processing better and more aware Musculoskeletal: Full range of motion in all 4 extremities. No joint swelling    Assessment/Plan: 1. 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  Physiatrist is providing close team supervision and 24 hour management of active medical problems listed below.  Physiatrist and rehab team continue to assess barriers to discharge/monitor patient progress toward functional and medical goals  Care Tool:  Bathing    Body parts bathed by patient: Right arm, Left arm, Chest, Abdomen, Right upper leg, Left upper leg, Front perineal area, Face   Body parts bathed by helper: Left lower leg, Right lower leg, Buttocks     Bathing assist Assist Level: Moderate Assistance - Patient 50 -  74%     Upper Body Dressing/Undressing Upper body dressing   What is the patient wearing?: Hospital gown only    Upper body assist Assist Level: Maximal Assistance - Patient 25 - 49%    Lower Body Dressing/Undressing Lower body dressing      What is the patient wearing?: Incontinence brief     Lower body assist Assist for  lower body dressing: Maximal Assistance - Patient 25 - 49%     Toileting Toileting    Toileting assist Assist for toileting: Maximal Assistance - Patient 25 - 49%     Transfers Chair/bed transfer  Transfers assist  Chair/bed transfer activity did not occur: Safety/medical concerns  Chair/bed transfer assist level: Minimal Assistance - Patient > 75%     Locomotion Ambulation   Ambulation assist      Assist level: Minimal Assistance - Patient > 75% Assistive device: Walker-rolling Max distance: 136ft   Walk 10 feet activity   Assist  Walk 10 feet activity did not occur: Safety/medical concerns  Assist level: Minimal Assistance - Patient > 75% Assistive device: Walker-rolling   Walk 50 feet activity   Assist Walk 50 feet with 2 turns activity did not occur: Safety/medical concerns  Assist level: Minimal Assistance - Patient > 75% Assistive device: Walker-rolling    Walk 150 feet activity   Assist Walk 150 feet activity did not occur: Safety/medical concerns  Assist level: Minimal Assistance - Patient > 75% Assistive device: Walker-rolling    Walk 10 feet on uneven surface  activity   Assist Walk 10 feet on uneven surfaces activity did not occur: Safety/medical concerns         Wheelchair     Assist Will patient use wheelchair at discharge?: No(per PT eval)             Wheelchair 50 feet with 2 turns activity    Assist            Wheelchair 150 feet activity     Assist          Blood pressure 121/73, pulse 92, temperature (!) 97.5 F (36.4 C), resp. rate 20, height 5\' 6"  (1.676 m), weight 53 kg, SpO2 100 %.  Medical Problem List and Plan: 1.  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  2. DVT/PE/ Antithrombotics: -DVT/anticoagulation:  Pharmaceutical: Other (comment)--on Eliquis- Now on Heparin IV since pre-procedure 3/21--continue pending repeat LP  -have reached out to ID re: plan for follow up LP. Would hate to continue him on IV heparin for 2+ weeks time.              -antiplatelet therapy: N/a 3. Pain Management: N/a 4. Mood: LCSW to monitor for evaluation and support when appropriate.              -antipsychotic agents: N/A 5. Neuropsych: This patient is not capable of making decisions on his own behalf. 6. Skin/Wound Care: routine pressure relief measures.  7. Fluids/Electrolytes/Nutrition: Monitor I/O. Check daily labs  -severe protein deficient malnutrition  -dietary supplements, RD following  3/23: -intake poor to inconsistent at present although ate better  this morning. Consider megace trial.    -I personally reviewed the patient's labs today.   8. Disseminated cryptococcal infection/meningoencephalitis: On Amphotericin B daily and Flucytosine every 6 hours--  daily BMP, Mg and K+ as well as periodic LFTs.     3/22: Repeat LP 3/21: glucose 27, protein 145, wbc 104, cx pending  -repeat LP prior to discharge per ID  -plan for 28 days of iv ampho (through 4/8) prior to change to long term fluconazole  -LFT's sl elevated but stable from 3/19 9. Hyponatremia: 135 3/23 10. Myasthenia gravis: Has been off Cellcept since Covid. Had been on prednisone long term prior to cellcept. Would not recommend tapering all the way off prednisone at this time. Continue 5mg  daily   - WIFE wants pt's Neurologist- a Emergency planning/management officer on Myasthenia gravis called with any med changes- Dr Westley Foots- [phone]  11. Post Covid hypoxia: Has resolved--current O2 sats on room ar 99%- continue to monitor.   12. Hypokalemia: Due to meds--being supplemented intermittently.   4.0 3/23 13.  Stage I pressure ulcer and skin tears-   routine skin care and call WOC if needed.    -appropriate turning  -improve nutrition as above  -continue foam dressing.   .     LOS: 5 days A FACE TO FACE EVALUATION WAS PERFORMED  [name] 02/29/2020, 1:15 PM

## 2020-02-29 NOTE — Progress Notes (Signed)
Occupational Therapy Session Note  Patient Details  Name: Duane Santos MRN: [id number] Date of Birth: 07/14/1946  Today's Date: 02/29/2020 OT Individual Time: 1105-1200 OT Individual Time Calculation (min): 55 min   Short Term Goals: Week 1:  OT Short Term Goal 1 (Week 1): Pt will complete toilet transfer with min A OT Short Term Goal 2 (Week 1): Pt will complete LB dressing with mod A OT Short Term Goal 3 (Week 1): Pt will self-feed 50% of meal with min A.  Skilled Therapeutic Interventions/Progress Updates:    Pt greeted asleep in bed, difficult to wake initially. OT had to manually move LEs towards EOB and turn bright lights on for pt to wake enough to participate. Once seated EOB, pt completed stand-pivot to wc with mod verbal cues and mod A. Pt did not report need to go to the bathroom, but given incontinence, but agreeable to try. Min A stand-pivot to toilet using grab bars. OT assist to doff pants. With increased time, pt able to void bladder, but told OT he did not think he went to the bathroom. Min A to stand and min/mod A for standing balance to pull up pants. Worked on sequencing and initiation within hand washing and toothbrushing task at the sink. Pt able to locate all items for toothbrushing task with min verbal cues. Improved sequencing this session with only verbal cues for task termination. Worked on standing balance/endurance with standing hair combing task, CGA for balance. Pt left seated in wc at end of session with alarm belt on, call bell in reach, and needs met.   Therapy Documentation Precautions:  Precautions Precautions: Fall Restrictions Weight Bearing Restrictions: No Pain: Pain Assessment Denies pain   Therapy/Group: Individual Therapy  Valma Cava 02/29/2020, 11:31 AM

## 2020-02-29 NOTE — Progress Notes (Signed)
[facility] for Infectious Disease  Date of Admission:  02/24/2020      Total days of antibiotics 16   Day 12 Flucytosine and L-Ampho    ASSESSMENT: Duane Santos is a 74 y.o. 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 He is HIV negative but on chronic therapy with Prednisone / Mycophenolate for myasthenia gravis and s/p COVID infection with tocilizumab therapy in January.    Currently on day 12 of induction therapy --> typically for HIV(-) individuals we treat with 28 days with induction therapy but may be able to convert to fluconazole earlier.  Still having some confusion and memory problems and intermittent frontal headaches. Opening pressures x 2 were < 8 cm water.  No pulmonary symptoms presently and on room air with adequate oxygen saturations.   Glucose still 27 with last LP 3/21 with negative culture at 2 days --> will continue to follow for growth. Anticoagulation plans to include Eliquis, but will be kept on heparin for now in the event he needs repeated LP - Further recs to follow.   Continue aggressive electrolyte replacement / monitoring while on L-Ampho.   Discharge plan, to be determined - L-Ampho higher risk medication for home use due to risks with severe electrolyte derangements.     PLAN: 1. Continue L-ampho and flucytosine 2. Continue aggressive renal and electrolyte monitoring    Principal Problem:   Cryptococcal meningitis (HCC) Active Problems:   Myasthenia gravis (HCC)   Encephalitis   Severe malnutrition (HCC)   Stage I pressure ulcer of sacral region   Protein-calorie malnutrition, severe   . butamben-tetracaine-benzocaine  1 spray Topical Once  . Chlorhexidine Gluconate Cloth  6 each Topical Daily  . dextrose  10 mL Intravenous Q24H  . dextrose  10 mL Intravenous Q24H  . feeding supplement (ENSURE ENLIVE)  237 mL Oral QID  . feeding supplement (PRO-STAT SUGAR FREE 64)  30  mL Oral BID  . flucytosine  25 mg/kg Oral Q6H  . multivitamin with minerals  1 tablet Oral Daily  . potassium chloride  40 mEq Oral Daily  . predniSONE  5 mg Oral Q breakfast  . sodium chloride  1,000 mL Intravenous Q24H  . sodium chloride  500 mL Intravenous Q24H  . sodium chloride flush  10-40 mL Intracatheter Q12H  . sodium chloride  1 g Oral TID WC  . thiamine  100 mg Oral Daily    SUBJECTIVE: He states he feels nervous and quezie today. Worried he "will be kicked out of this place soon."  Ongoing intermittent frontal/bitemporal headaches described to be 5/10 in severity. He states they come and go - feels like he is "probably getting pain medication for this" but unclear what medications he is getting. He feels he is still not clear headed. Unclear when he last spoke with his wife.   CSF 3/11 with Cryptococcal Ag titer 1:2560  OP 9 cm water, Glucose 21, Protein 254, WBC 72 >> 62, Cx positive for growth  CSF 3/21 with OP 7 cm water, Glucose 27, Protein 145, WBC 104 (82% lymphs); cultures no growth @ 2 days.   Cryptococcus neoformans in sputum as well   Review of Systems: Review of Systems  Constitutional: Negative for chills and fever.  HENT: Negative for tinnitus.   Eyes: Negative for blurred vision and photophobia.  Respiratory: Negative for cough and sputum production.   Cardiovascular: Negative for chest pain.  Gastrointestinal: Negative for diarrhea, nausea and vomiting.  Genitourinary: Negative for dysuria.  Skin: Negative for rash.  Neurological: Positive for headaches.  Psychiatric/Behavioral: Positive for memory loss. The patient is nervous/anxious.     No Known Allergies  OBJECTIVE: Vitals:   02/28/20 2029 02/28/20 2300 02/29/20 0507 02/29/20 1308  BP: 111/71  131/77 121/73  Pulse: (!) 102  93 92  Resp: 20  19 20   Temp: 97.7 F (36.5 C)  (!) 97.4 F (36.3 C) (!) 97.5 F (36.4 C)  TempSrc: Oral  Oral   SpO2: 99% 98% 100% 100%  Weight:      Height:        Body mass index is 18.86 kg/m.  Physical Exam Vitals and nursing note reviewed.  Constitutional:      Appearance: He is well-developed.     Comments: Seated comfortably in chair during visit.   HENT:     Mouth/Throat:     Mouth: Mucous membranes are moist.     Dentition: Normal dentition. No dental abscesses.     Pharynx: No oropharyngeal exudate.  Cardiovascular:     Rate and Rhythm: Normal rate and regular rhythm.     Heart sounds: Normal heart sounds.  Pulmonary:     Effort: Pulmonary effort is normal.     Breath sounds: Normal breath sounds.  Abdominal:     General: There is no distension.     Palpations: Abdomen is soft.     Tenderness: There is no abdominal tenderness.  Musculoskeletal:        General: Normal range of motion.  Lymphadenopathy:     Cervical: No cervical adenopathy.  Skin:    General: Skin is warm and dry.     Findings: No rash.  Neurological:     Mental Status: He is alert. He is disoriented.     Comments: Generalized weakness. Able to follow commands. Equal movement/strength bilaterally.   Psychiatric:        Judgment: Judgment normal.     Comments: Appears worried. Delayed responses.      Lab Results Lab Results  Component Value Date   WBC 6.3 02/29/2020   HGB 10.5 (L) 02/29/2020   HCT 32.0 (L) 02/29/2020   MCV 100.0 02/29/2020   PLT 257 02/29/2020    Lab Results  Component Value Date   CREATININE 1.04 02/29/2020   BUN 21 02/29/2020   NA 135 02/29/2020   K 4.0 02/29/2020   CL 103 02/29/2020   CO2 22 02/29/2020    Lab Results  Component Value Date   ALT 65 (H) 02/28/2020   AST 38 02/28/2020   ALKPHOS 76 02/28/2020   BILITOT 0.7 02/28/2020     Microbiology: Recent Results (from the past 240 hour(s))  CSF culture     Status: None (Preliminary result)   Collection Time: 02/27/20  1:30 PM   Specimen: PATH Cytology CSF; Cerebrospinal Fluid  Result Value Ref Range Status   Specimen Description CSF  Final   Special Requests NONE   Final   Gram Stain   Final    WBC PRESENT, PREDOMINANTLY MONONUCLEAR NO ORGANISMS SEEN CYTOSPIN SMEAR Performed at [facility], [place] [address], [place], [place] [address]    Culture NO GROWTH 2 DAYS  Final   Report Status PENDING  Incomplete  Culture, fungus without smear     Status: None (Preliminary result)   Collection Time: 02/27/20  1:30 PM   Specimen: PATH Cytology CSF; Cerebrospinal Fluid  Result Value  Ref Range Status   Specimen Description CSF  Final   Special Requests NONE  Final   Culture   Final    NO FUNGUS ISOLATED AFTER 2 DAYS Performed at [facility], [address] [place], [place] [address]    Report Status PENDING  Incomplete     Janene Madeira, MSN, NP-C [facility] for Infectious Disease [email or web address] Pager: [phone] Office: [phone] [place]: [phone]

## 2020-02-29 NOTE — Progress Notes (Signed)
ANTICOAGULATION CONSULT NOTE  Pharmacy Consult for heparin Indication: pulmonary embolus Jan 2021  Patient Measurements: Height: 5\' 6"  (167.6 cm) Weight: 116 lb 13.5 oz (53 kg) IBW/kg (Calculated) : 63.8 Heparin Dosing Weight: 53 kg  Vital Signs: Temp: 97.4 F (36.3 C) (03/23 0507) Temp Source: Oral (03/23 0507) BP: 131/77 (03/23 0507) Pulse Rate: 93 (03/23 0507)  Labs:  Recent Labs    02/26/20 0412 02/26/20 1527 02/26/20 1706 02/27/20 0146 02/27/20 0908 02/27/20 2342 02/28/20 0807  HGB  --   --   --  10.7*  --   --  10.9*  HCT  --   --   --  32.7*  --   --  33.4*  PLT  --   --   --  246  --   --  264  APTT  --    < >   < >  --  58* 65* 100*  HEPARINUNFRC  --    < >   < >  --  1.26* 0.99* 1.10*  CREATININE 0.83  --   --  0.86  --   --  0.96   < > = values in this interval not displayed.    Estimated Creatinine Clearance: 48.1 mL/min (by C-G formula based on SCr of 1.04 mg/dL).    Assessment:  73y.o. 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 Patient was on apixaban 5mg  bid (last dose 0843 on 3/20) and transitioned to heparin for LP on 3/20. Of note, patient will need repeat LP near end of therapy in early April and may continue on heparin until then.  APTT therapeutic at 70 this AM. Heparin level not correlating yet with aPTT. Heparin is running via right PIV and IV team has been drawing level from left PICC line. H/H, plt stable. No active bleed issues reported.   Goal of Therapy:  Heparin level 0.3-0.7 units/ml  APTT 66- 102 sec Monitor platelets by anticoagulation protocol: Yes   Plan:   Continue heparin IV at 800 units/hr Monitor daily heparin level/aPTT/CBC, s/sx bleeding  Continue heparin until no further LP needed  F/u eventual switch back to apixaban    Arturo Morton, PharmD, BCPS Please check AMION for all [facility] contact numbers Clinical Pharmacist 02/29/2020 8:30 AM

## 2020-02-29 NOTE — Patient Care Conference (Signed)
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 Stage I pressure ulcer of sacral region 02/25/2020  . Protein-calorie malnutrition, severe 02/25/2020  . Encephalitis 02/24/2020  . 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 Severe malnutrition ([place]) 02/24/2020  . Acute confusional state 02/11/2020  . Palliative care encounter   . Generalized weakness 02/09/2020  . Hyponatremia 02/09/2020  . History of COVID-19 pneumonia with hypoxia 02/09/2020  . Acute metabolic encephalopathy 85/88/5027  . Chronic respiratory failure with hypoxia ([place]) 02/09/2020  . Right lower lobe lung mass 01/10/2020  . Acute pulmonary embolism without acute cor pulmonale ([place]) 01/10/2020  . Pneumonia due to COVID-19 virus 12/21/2019  . Acute respiratory failure with hypoxia ([place]) 12/21/2019  . Myasthenia gravis ([place]) 12/21/2019  . Normocytic anemia 12/21/2019  . Osteoporosis 12/21/2019  . Multifocal pneumonia 12/20/2019    Expected Discharge Date: Expected Discharge Date: 03/15/20  Team Members Present: Physician leading conference: Dr. Alger Simons Care Coodinator Present: Loralee Pacas, LCSWA;Genie Kennen Stammer, RN, MSN Nurse Present: Ellison Carwin, LPN PT Present: Lavone Nian, PT OT Present: Cherylynn Ridges, OT SLP Present: Weston Anna, SLP PPS Coordinator present : Ileana Ladd, Burna Mortimer, SLP     Current Status/Progress Goal Weekly Team Focus  Bowel/Bladder   pt is incont/cont at times more incont @night . Unknown bowel movement  given aids to  help bowel movements  assess B&B qshift and prn   Swallow/Nutrition/ Hydration             ADL's   Min/mod A overall  supervision  cognitive retraining, activity tolerance, transfers, balance, coordination   Mobility   min assist sit<>stand, min assist gait x 100 ft with RW, very poor cognition & awareness  supervision overall with LRAD except CGA stairs with B rails  gait, balance, cognition, transfers, bed mobility, stairs as able, endurance, strengthening   Communication             Safety/Cognition/ Behavioral Observations  Max-Total A  Mod A  orientation, attention, awareness, basic problem solving, recall   Pain   no c/o of pain  keep pain level at 3 am  assess pain qshift and prn   Skin   MASD on grion  remain free of any new breakdown  assess skin qshift and prn    Rehab Goals Patient on target to meet rehab goals: Yes *See Care Plan and progress notes for long and short-term goals.     Barriers to Discharge  Current Status/Progress Possible Resolutions Date Resolved   Nursing                  PT  Decreased caregiver support;Home environment access/layout;Incontinence  unsure if family can provide 24 hr assist at d/c, unsure of home set up as info taken from chart              OT                  SLP  SW                Discharge Planning/Teaching Needs:  Pt to d/c to home with wife Maudie Mercury who will provide 24/7 care  family education as recommended by therapy   Team Discussion: MD 74 yo with MG, on steroids, got Covid, then cryptococcal meningitis, on IV abx, lumbar pucture done, f/u with ID, on IV heparin, confused, eating better.  RN inc B/B, very confused, hep drip, skin tears arms, BM today.  OT min/mod overall, clearing some, S goals.  PT amb 100' min a, min A transfers, confused.  SLP more attentive, max A orientation, max A basic cognition.  Per MD wife upset with IR this past weekend, IR to call wife.   Revisions to Treatment Plan: N/A      Medical Summary Current Status: cryptococcal meningitis, recent covid, baseline MG. still confused, more alert, intake/pain improved Weekly Focus/Goal: ID/neuro considerations, nutrition, anticoagulation---IV heparin  Barriers to Discharge: Medical stability   Possible Resolutions to Barriers: see medical progress notes   Continued Need for Acute Rehabilitation Level of Care: The patient requires daily medical management by a physician with specialized training in physical medicine and rehabilitation for the following reasons: Direction of a multidisciplinary physical rehabilitation program to maximize functional independence : Yes Medical management of patient stability for increased activity during participation in an intensive rehabilitation regime.: Yes Analysis of laboratory values and/or radiology reports with any subsequent need for medication adjustment and/or medical intervention. : Yes   I attest that I was present, lead the team conference, and concur with the assessment and plan of the team.   Jodell Cipro M 02/29/2020, 3:04 PM   Team conference was held via web/ teleconference due to [place] - 19

## 2020-02-29 NOTE — Progress Notes (Signed)
Speech Language Pathology Daily Session Note  Patient Details  Name: Duane Santos MRN: [id number] Date of Birth: 01-Feb-1946  Today's Date: 02/29/2020 SLP Individual Time: 0905-0930 SLP Individual Time Calculation (min): 25 min  Short Term Goals: Week 1: SLP Short Term Goal 1 (Week 1): Pt will sustain attention to basic task for 2 minutes with Max A cues. SLP Short Term Goal 2 (Week 1): Pt will follow 1 step direcitons in 3 out of 5 opportunities with Max A cues. SLP Short Term Goal 3 (Week 1): Pt will utilize external memory aids to recall orientation information with Max A cues. SLP Short Term Goal 4 (Week 1): With Max A cues, pt will demonstrate intellectual awareness by correctly answering yes/no questions related to physical and cognitive deficits. SLP Short Term Goal 5 (Week 1): Pt will initiate basic familiar task in 3 out of 5 opportunities with Max A cues.  Skilled Therapeutic Interventions: Skilled treatment session focused on cognitive goals. SLP facilitated session by providing extra time and overall supervision level verbal cues for problem solving, selective attention and organization during a 4-step picture sequencing task. SLP also gave patient reading glasses to borrow at beginning of session which he independently remembered to give back at end of session. Patient left upright in bed with alarm on and all needs within reach. Continue with current plan of care.      Pain No/Denies pain  Therapy/Group: Individual Therapy  Reyonna Haack 02/29/2020, 10:04 AM

## 2020-02-29 NOTE — Plan of Care (Signed)
  Problem: Consults Goal: RH GENERAL PATIENT EDUCATION Description: See Patient Education module for education specifics. Outcome: Progressing Goal: Skin Care Protocol Initiated - if Braden Score 18 or less Description: If consults are not indicated, leave blank or document N/A Outcome: Progressing Goal: Nutrition Consult-if indicated Outcome: Progressing   Problem: RH BOWEL ELIMINATION Goal: RH STG MANAGE BOWEL WITH ASSISTANCE Description: STG Manage Bowel with mod I Assistance. Outcome: Progressing Goal: RH STG MANAGE BOWEL W/MEDICATION W/ASSISTANCE Description: STG Manage Bowel with Medication with mod I Assistance. Outcome: Progressing   Problem: RH BLADDER ELIMINATION Goal: RH STG MANAGE BLADDER WITH ASSISTANCE Description: STG Manage Bladder With min Assistance Outcome: Progressing Goal: RH STG MANAGE BLADDER WITH EQUIPMENT WITH ASSISTANCE Description: STG Manage Bladder With Equipment With min Assistance Outcome: Progressing   Problem: RH SKIN INTEGRITY Goal: RH STG SKIN FREE OF INFECTION/BREAKDOWN Description: Will have no new areas of break down Outcome: Progressing Goal: RH STG MAINTAIN SKIN INTEGRITY WITH ASSISTANCE Description: STG Maintain Skin Integrity With mod I Assistance. Outcome: Progressing Goal: RH STG ABLE TO PERFORM INCISION/WOUND CARE W/ASSISTANCE Description: STG Able To Perform Incision/Wound Care With mod I Assistance. Outcome: Progressing   Problem: RH SAFETY Goal: RH STG ADHERE TO SAFETY PRECAUTIONS W/ASSISTANCE/DEVICE Description: STG Adhere to Safety Precautions With Assistance/Device. Outcome: Progressing   Problem: RH PAIN MANAGEMENT Goal: RH STG PAIN MANAGED AT OR BELOW PT'S PAIN GOAL Description: Pain scale <2/10 Outcome: Progressing   Problem: RH KNOWLEDGE DEFICIT GENERAL Goal: RH STG INCREASE KNOWLEDGE OF SELF CARE AFTER HOSPITALIZATION Outcome: Progressing

## 2020-02-29 NOTE — Progress Notes (Signed)
ANTICOAGULATION CONSULT NOTE  Pharmacy Consult for heparin Indication: pulmonary embolus Jan 2021  Patient Measurements: Height: 5\' 6"  (167.6 cm) Weight: 116 lb 13.5 oz (53 kg) IBW/kg (Calculated) : 63.8 Heparin Dosing Weight: 53 kg  Vital Signs: Temp: 97.5 F (36.4 C) (03/23 1308) Temp Source: Oral (03/23 0507) BP: 121/73 (03/23 1308) Pulse Rate: 92 (03/23 1308)  Labs:  Recent Labs    02/26/20 0412 02/26/20 1527 02/26/20 1706 02/27/20 0146 02/27/20 0908 02/27/20 2342 02/28/20 0807  HGB  --   --   --  10.7*  --   --  10.9*  HCT  --   --   --  32.7*  --   --  33.4*  PLT  --   --   --  246  --   --  264  APTT  --    < >   < >  --  58* 65* 100*  HEPARINUNFRC  --    < >   < >  --  1.26* 0.99* 1.10*  CREATININE 0.83  --   --  0.86  --   --  0.96   < > = values in this interval not displayed.    Estimated Creatinine Clearance: 48.1 mL/min (by C-G formula based on SCr of 1.04 mg/dL).    Assessment:  73y.o. 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 Patient was on apixaban 5mg  bid (last dose 0843 on 3/20) and transitioned to heparin for LP on 3/20.  Pharmacy consulted to transition back to apixaban this evening per discussion with Dr. Naaman Plummer. Will d/c heparin IV at the time of the 1st dose of apixaban. H/H, plt stable. No active bleed issues reported.   Goal of Therapy:  Heparin level 0.3-0.7 units/ml  APTT 66- 102 sec Monitor platelets by anticoagulation protocol: Yes   Plan:   D/c heparin drip (stop time entered) at time of first dose of apixaban 5mg  PO BID at 2000 Monitor CBC, s/sx bleeding   Arturo Morton, PharmD, BCPS Please check AMION for all [place] contact numbers Clinical Pharmacist 02/29/2020 3:08 PM

## 2020-02-29 NOTE — Progress Notes (Signed)
Physical Therapy Session Note  Patient Details  Name: Duane Santos MRN: [id number] Date of Birth: 04-29-1946  Today's Date: 02/29/2020 PT Individual Time: 0370-9643 PT Individual Time Calculation (min): 42 min   Short Term Goals: Week 1:  PT Short Term Goal 1 (Week 1): Pt will complete bed<>w/c transfers with LRAD & CGA. PT Short Term Goal 2 (Week 1): Pt will ambulate 75 ft with LRAD & CGA. PT Short Term Goal 3 (Week 1): Pt will negotiate 8 steps with B rails with min assist.  Skilled Therapeutic Interventions/Progress Updates:    Patient received in bed finishing breakfast and with RN working on giving meds; note slow processing and poor attention span and recall (often forgets to finish his thoughts and then had difficulty in remembering what he was talking about). Needed extra time and Mod-Max cues to stay on task as well as cues for appropriate and related answers to questions. Utilized cognitive remediation techniques and cuing to encourage him to take his meds with RN and to participate in OOB activity, then able to perform bed mobility with min guard and transfers to and from [facility] and RW and MinA with cues for safety and sequencing. Transported him to PT gym totalA in [facility] for time management, then tolerated gait training 149f with RW and MinA mostly for RW management/balance with turns. [facility]W/MinA and VC for safety. He was left in bed with all needs met and bed alarm active.   Therapy Documentation Precautions:  Precautions Precautions: Fall Restrictions Weight Bearing Restrictions: No   Pain: Pain Assessment Pain Scale: Faces Faces Pain Scale: Hurts a little bit Pain Type: Acute pain Pain Location: Neck Pain Orientation: Posterior Pain Descriptors / Indicators: Aching;Discomfort Pain Onset: On-going Patients Stated Pain Goal: 0 Pain Intervention(s): Repositioned;Ambulation/increased activity Multiple Pain Sites:  No    Therapy/Group: Individual Therapy   KWindell Norfolk DPT, PN1   Supplemental Physical Therapist [place]   Pager [phone] Rehab Office [phone]   02/29/2020, 10:09 AM

## 2020-03-01 ENCOUNTER — Inpatient Hospital Stay (HOSPITAL_COMMUNITY): Payer: Medicare HMO | Admitting: Speech Pathology

## 2020-03-01 ENCOUNTER — Inpatient Hospital Stay (HOSPITAL_COMMUNITY): Payer: Medicare HMO | Admitting: Physical Therapy

## 2020-03-01 ENCOUNTER — Inpatient Hospital Stay (HOSPITAL_COMMUNITY): Payer: Medicare HMO

## 2020-03-01 LAB — BASIC METABOLIC PANEL
Anion gap: 8 (ref 5–15)
BUN: 16 mg/dL (ref 8–23)
CO2: 24 mmol/L (ref 22–32)
Calcium: 9.2 mg/dL (ref 8.9–10.3)
Chloride: 102 mmol/L (ref 98–111)
Creatinine, Ser: 0.88 mg/dL (ref 0.61–1.24)
GFR calc Af Amer: >60 mL/min (ref >60)
GFR calc non Af Amer: >60 mL/min (ref >60)
Glucose, Bld: 90 mg/dL (ref 70–99)
Potassium: 3.7 mmol/L (ref 3.5–5.1)
Sodium: 134 mmol/L — ABNORMAL LOW (ref 135–145)

## 2020-03-01 LAB — CBC
HCT: 25.3 % — ABNORMAL LOW (ref 39.0–52.0)
Hemoglobin: 7.4 g/dL — ABNORMAL LOW (ref 13.0–17.0)
MCH: 28 pg (ref 26.0–34.0)
MCHC: 29.2 g/dL — ABNORMAL LOW (ref 30.0–36.0)
MCV: 95.8 fL (ref 80.0–100.0)
Platelets: 419 10*3/uL — ABNORMAL HIGH (ref 150–400)
RBC: 2.64 MIL/uL — ABNORMAL LOW (ref 4.22–5.81)
RDW: 16.4 % — ABNORMAL HIGH (ref 11.5–15.5)
WBC: 18.5 10*3/uL — ABNORMAL HIGH (ref 4.0–10.5)
nRBC: 0 % (ref 0.0–0.2)

## 2020-03-01 LAB — CSF CULTURE W GRAM STAIN: Culture: NO GROWTH

## 2020-03-01 LAB — MAGNESIUM: Magnesium: 1.8 mg/dL (ref 1.7–2.4)

## 2020-03-01 MED ORDER — MAGNESIUM SULFATE IN D5W 1-5 GM/100ML-% IV SOLN
1.0000 g | Freq: Once | INTRAVENOUS | Status: AC
  Administered 2020-03-01: 1 g via INTRAVENOUS
  Filled 2020-03-01: qty 100

## 2020-03-01 MED ORDER — POTASSIUM CHLORIDE CRYS ER 20 MEQ PO TBCR
20.0000 meq | EXTENDED_RELEASE_TABLET | Freq: Once | ORAL | Status: AC
  Administered 2020-03-01: 20 meq via ORAL
  Filled 2020-03-01: qty 1

## 2020-03-01 NOTE — Progress Notes (Signed)
Physical Therapy Session Note  Patient Details  Name: ABDIFATAH COLQUHOUN MRN: [id number] Date of Birth: Nov 30, 1946  Today's Date: 03/01/2020 PT Individual Time: 0810-0845 PT Individual Time Calculation (min): 35 min   Short Term Goals: Week 1:  PT Short Term Goal 1 (Week 1): Pt will complete bed<>w/c transfers with LRAD & CGA. PT Short Term Goal 2 (Week 1): Pt will ambulate 75 ft with LRAD & CGA. PT Short Term Goal 3 (Week 1): Pt will negotiate 8 steps with B rails with min assist.  Skilled Therapeutic Interventions/Progress Updates:  Pt received in bed with nurse present & pt finishing breakfast with pt agreeable to tx.   Pt dons shirt from bed level with supervision, pants while sitting EOB with supervision for sitting balance, min assist for standing balance but no assist with clothing.  Supine>sit with supervision, HOB elevated and bed rails.   Pt reports need to urinate so gait in room/bathroom with RW & min assist with cuing to turn to toilet. Pt manages clothing with cuing and completes toilet transfer with min assist. Pt with incontinent void in brief & continent void & BM on toilet with significantly extra time. Pt is able to perform peri hygiene without assistance with significantly extra time, as well multiple attempts. Pt is able to maintain sitting balance on toilet with supervision. Due to pt requiring significantly extra time on toilet pt left in handoff to NT.  Pt missed 25 minutes of scheduled PT treatment 2/2 toileting.  Therapy Documentation Precautions:  Precautions Precautions: Fall Restrictions Weight Bearing Restrictions: No  Pain: No c/o pain reported to this therapist but pt reports having issues with his neck to MD & she spoke of ordering a k-pad.  Therapy/Group: Individual Therapy  [name] 03/01/2020, 8:53 AM

## 2020-03-01 NOTE — Progress Notes (Signed)
Occupational Therapy Session Note  Patient Details  Name: Duane Santos MRN: [id number] Date of Birth: 27-Sep-1946  Today's Date: 03/01/2020 OT Individual Time: 1455-1536 OT Individual Time Calculation (min): 41 min    Short Term Goals: Week 1:  OT Short Term Goal 1 (Week 1): Pt will complete toilet transfer with min A OT Short Term Goal 2 (Week 1): Pt will complete LB dressing with mod A OT Short Term Goal 3 (Week 1): Pt will self-feed 50% of meal with min A.  Skilled Therapeutic Interventions/Progress Updates:    1;1. Pt received in w/c agreeable to OT. Pt completes ambulation from tx gym entrace to therapy mat with CGA and VC for RW management and wider stance during turns to sit to mat. Pt completes standing balance graded pipe tree activity with significantly increased time to follow cues/picture to build PVC figure. Pt requires S with standing balance on standard tile, MIN A on compliant surface and total A for posterior bias standing on foam wedge. Pt unable to complete anterior weight shift and in static stance has increased terminal hip extension/lumbar extension requiring VC/dmeo cuing to correct. Exited session with pt seated in bed, exit alarm on and call light tin reach  Therapy Documentation Precautions:  Precautions Precautions: Fall Restrictions Weight Bearing Restrictions: No General:   Vital Signs:   Pain:   ADL: ADL Eating: Maximal assistance Grooming: Moderate assistance Upper Body Bathing: Moderate assistance Lower Body Bathing: Maximal assistance Upper Body Dressing: Moderate assistance Lower Body Dressing: Maximal assistance Toileting: Maximal assistance Toilet Transfer: Moderate verbal cueing Vision   Perception    Praxis   Exercises:   Other Treatments:     Therapy/Group: Individual Therapy  Tonny Branch 03/01/2020, 2:36 PM

## 2020-03-01 NOTE — Progress Notes (Signed)
Speech Language Pathology Daily Session Note  Patient Details  Name: Duane Santos MRN: [id number] Date of Birth: 23-Sep-1946  Today's Date: 03/01/2020 SLP Individual Time: 7915-0569 SLP Individual Time Calculation (min): 40 min  Short Term Goals: Week 1: SLP Short Term Goal 1 (Week 1): Pt will sustain attention to basic task for 2 minutes with Max A cues. SLP Short Term Goal 2 (Week 1): Pt will follow 1 step direcitons in 3 out of 5 opportunities with Max A cues. SLP Short Term Goal 3 (Week 1): Pt will utilize external memory aids to recall orientation information with Max A cues. SLP Short Term Goal 4 (Week 1): With Max A cues, pt will demonstrate intellectual awareness by correctly answering yes/no questions related to physical and cognitive deficits. SLP Short Term Goal 5 (Week 1): Pt will initiate basic familiar task in 3 out of 5 opportunities with Max A cues.  Skilled Therapeutic Interventions: Skilled treatment session focused on cognitive goals. SLP facilitated session by providing Mod A verbal and visual cues for mildly complex problem solving during a 6-step picture sequencing task. Patient demonstrated sustained attention to task for ~30 minutes with overall supervision level verbal cues. Patient left upright in the wheelchair with alarm on and all needs within reach. Continue with current plan of care.      Pain No/Denies Pain   Therapy/Group: Individual Therapy  Willmar Stockinger 03/01/2020, 12:34 PM

## 2020-03-01 NOTE — Progress Notes (Signed)
[facility]  Date of Admission:  02/24/2020      Total days of antibiotics 17   Day 14 Flucytosine and L-Ampho    ASSESSMENT: Duane Santos is a 74 y.o. 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 He is HIV negative but on chronic therapy with Prednisone / Mycophenolate for myasthenia gravis and s/p COVID infection with tocilizumab therapy in January.    Currently on day 14 of induction therapy. Still having some confusion and memory problems and intermittent frontal headaches. Opening pressures x 2 were < 8 cm water. Most recent CSF sampling with no growth to date. No pulmonary symptoms presently and on room air with adequate oxygen saturations. Will continue for another anticipated week of treatment to see if any improvement in memory and confusion. Hopefully can transition to PO fluconazole at that time.   Continue aggressive electrolyte replacement / monitoring while on L-Ampho.   Leukocytosis noted over night to 18K - unclear of significance at this time. He has no new complaints or findings on exams today.    PLAN: 1. Continue L-ampho and flucytosine 2. Continue aggressive renal and electrolyte monitoring    Principal Problem:   Cryptococcal meningitis (HCC) Active Problems:   Myasthenia gravis (HCC)   Encephalitis   Severe malnutrition (HCC)   Stage I pressure ulcer of sacral region   Protein-calorie malnutrition, severe   . apixaban  5 mg Oral BID  . butamben-tetracaine-benzocaine  1 spray Topical Once  . Chlorhexidine Gluconate Cloth  6 each Topical Daily  . dextrose  10 mL Intravenous Q24H  . dextrose  10 mL Intravenous Q24H  . feeding supplement (ENSURE ENLIVE)  237 mL Oral QID  . feeding supplement (PRO-STAT SUGAR FREE 64)  30 mL Oral BID  . flucytosine  25 mg/kg Oral Q6H  . multivitamin with minerals  1 tablet Oral Daily  . potassium chloride  20 mEq Oral Once  .  potassium chloride  40 mEq Oral Daily  . predniSONE  5 mg Oral Q breakfast  . sodium chloride  1,000 mL Intravenous Q24H  . sodium chloride  500 mL Intravenous Q24H  . sodium chloride flush  10-40 mL Intracatheter Q12H  . sodium chloride  1 g Oral TID WC  . thiamine  100 mg Oral Daily    SUBJECTIVE: He states he feels nervous and quezie today. Worried he "will be kicked out of this place soon."  Ongoing intermittent frontal/bitemporal headaches described to be 5/10 in severity. He states they come and go - feels like he is "probably getting pain medication for this" but unclear what medications he is getting. He feels he is still not clear headed. Unclear when he last spoke with his wife.   CSF 3/11 with Cryptococcal Ag titer 1:2560  OP 9 cm water, Glucose 21, Protein 254, WBC 72 >> 62, Cx positive for growth  CSF 3/21 with OP 7 cm water, Glucose 27, Protein 145, WBC 104 (82% lymphs); cultures no growth @ 2 days.   Cryptococcus neoformans in sputum as well   Review of Systems: Review of Systems  Constitutional: Negative for chills and fever.  HENT: Negative for tinnitus.   Eyes: Negative for blurred vision and photophobia.  Respiratory: Negative for cough and sputum production.   Cardiovascular: Negative for chest pain.  Gastrointestinal: Negative for diarrhea, nausea and vomiting.  Genitourinary: Negative for dysuria.  Skin: Negative for  rash.  Neurological: Positive for headaches.  Psychiatric/Behavioral: Positive for memory loss. The patient is nervous/anxious.     No Known Allergies  OBJECTIVE: Vitals:   02/29/20 0507 02/29/20 1308 02/29/20 1938 03/01/20 0308  BP: 131/77 121/73 115/73 139/79  Pulse: 93 92 (!) 102 84  Resp: 19 20 18 18   Temp: (!) 97.4 F (36.3 C) (!) 97.5 F (36.4 C) 97.9 F (36.6 C) 97.9 F (36.6 C)  TempSrc: Oral  Oral   SpO2: 100% 100% 100% 100%  Weight:      Height:       Body mass index is 18.86 kg/m.  Physical Exam Vitals and nursing  note reviewed.  Constitutional:      Appearance: He is well-developed.     Comments: Seated comfortably in chair during visit.   HENT:     Mouth/Throat:     Mouth: Mucous membranes are moist.     Dentition: Normal dentition. No dental abscesses.     Pharynx: No oropharyngeal exudate.  Cardiovascular:     Rate and Rhythm: Normal rate and regular rhythm.     Heart sounds: Normal heart sounds.  Pulmonary:     Effort: Pulmonary effort is normal.     Breath sounds: Normal breath sounds.  Abdominal:     General: There is no distension.     Palpations: Abdomen is soft.     Tenderness: There is no abdominal tenderness.  Musculoskeletal:        General: Normal range of motion.  Lymphadenopathy:     Cervical: No cervical adenopathy.  Skin:    General: Skin is warm and dry.     Findings: No rash.  Neurological:     Mental Status: He is alert. He is disoriented.     Comments: Generalized weakness. Able to follow commands. Equal movement/strength bilaterally.   Psychiatric:        Judgment: Judgment normal.     Comments: Appears worried. Delayed responses.      Lab Results Lab Results  Component Value Date   WBC 18.5 (H) 03/01/2020   HGB 7.4 (L) 03/01/2020   HCT 25.3 (L) 03/01/2020   MCV 95.8 03/01/2020   PLT 419 (H) 03/01/2020    Lab Results  Component Value Date   CREATININE 0.88 03/01/2020   BUN 16 03/01/2020   NA 134 (L) 03/01/2020   K 3.7 03/01/2020   CL 102 03/01/2020   CO2 24 03/01/2020    Lab Results  Component Value Date   ALT 65 (H) 02/28/2020   AST 38 02/28/2020   ALKPHOS 76 02/28/2020   BILITOT 0.7 02/28/2020     Microbiology: Recent Results (from the past 240 hour(s))  CSF culture     Status: None   Collection Time: 02/27/20  1:30 PM   Specimen: PATH Cytology CSF; Cerebrospinal Fluid  Result Value Ref Range Status   Specimen Description CSF  Final   Special Requests NONE  Final   Gram Stain   Final    WBC PRESENT, PREDOMINANTLY MONONUCLEAR NO  ORGANISMS SEEN CYTOSPIN SMEAR    Culture   Final    NO GROWTH Performed at [facility], [address], [place], [place] [address]    Report Status 03/01/2020 FINAL  Final  Culture, fungus without smear     Status: None (Preliminary result)   Collection Time: 02/27/20  1:30 PM   Specimen: PATH Cytology CSF; Cerebrospinal Fluid  Result Value Ref Range Status   Specimen Description CSF  Final   Special Requests NONE  Final   Culture   Final    NO FUNGUS ISOLATED AFTER 3 DAYS Performed at [facility], [place] [address], [place], [place] [address]    Report Status PENDING  Incomplete     Janene Madeira, MSN, NP-[facility] [email or web address] Pager: [phone] Office: [phone] [place]: [phone]

## 2020-03-01 NOTE — Progress Notes (Signed)
Occupational Therapy Session Note  Patient Details  Name: Duane Santos MRN: [id number] Date of Birth: 25-Feb-1946  Today's Date: 03/01/2020 OT Individual Time: 1134-1200 OT Individual Time Calculation (min): 26 min    Short Term Goals: Week 1:  OT Short Term Goal 1 (Week 1): Pt will complete toilet transfer with min A OT Short Term Goal 2 (Week 1): Pt will complete LB dressing with mod A OT Short Term Goal 3 (Week 1): Pt will self-feed 50% of meal with min A.  Skilled Therapeutic Interventions/Progress Updates:    1:1. Pt received in bed asleep requiring increased time to arouse. Pt requires MAX A to supine>sitting d/t decreased arousal but much improved once EOB. Pt scoots with VC for reciprocal pattern and stand pivot transfer with MIN A to w/c on R. Pt grroms at sink with no VC for face washing and VC for terminating drying hair. Pt completes oral care for 5 min d/t decreased termination. When OT cues "go ahead and spit" pt stated, "not yet." pointed out pt drooling on self and pt stands for imporved positionging over sink. Once standing with water running patient spits out into sink iwht CGA for balance. Exited session with pt seated in w/c, belt alarm on and call light in reach  Therapy Documentation Precautions:  Precautions Precautions: Fall Restrictions Weight Bearing Restrictions: No General: General PT Missed Treatment Reason: Toileting Vital Signs:   Pain: Pain Assessment Pain Scale: 0-10 Pain Score: 0-No pain Faces Pain Scale: No hurt ADL: ADL Eating: Maximal assistance Grooming: Moderate assistance Upper Body Bathing: Moderate assistance Lower Body Bathing: Maximal assistance Upper Body Dressing: Moderate assistance Lower Body Dressing: Maximal assistance Toileting: Maximal assistance Toilet Transfer: Moderate verbal cueing Vision   Perception    Praxis   Exercises:   Other Treatments:     Therapy/Group: Individual Therapy  Tonny Branch 03/01/2020, 11:42 AM

## 2020-03-01 NOTE — Progress Notes (Signed)
Pharmacy Antibiotic Note  Duane Santos is a 74 y.o. male admitted on 02/24/2020 with meningitis.  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  Assessment: 81 YOM with myasthenia gravis on prednisone/mycophenolate.  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  Started on amphotericin - liposomal and flucytosine evening on 3/11. He had LP and BAL performed 3/12 - CSF cx with yeast .  Transferred to inpatient rehab on 3/18.  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  Scr today is down and stable at 0.88. Would continue pre-dose 500 mL bolus and post-dose 1000 mL bolus fluids. Magnesium is down slightly today to 1.8. Potassium is down to 3.7.   Plan:  Continue amphotericin B 200 mg IV daily  Continue normal saline 500 mL pre-dose and 1000 mL post-dose for renal protection Continue potassium 40 mEQ PO daily Give an extra 20 mEQ potassium PO today Give Mg sulfate 1 gm IV X 1  Monitor renal function and electrolytes   Height: _0  (167.6 cm) Weight: 116 lb 13.5 oz (53 kg) IBW/kg (Calculated) : 63.8  Temp (24hrs), Avg:97.8 F (36.6 C), Min:97.5 F (36.4 C), Max:97.9 F (36.6 C)  Recent Labs  Lab 02/25/20 0430 02/25/20 0430 02/26/20 0412 02/27/20 0146 02/28/20 0807 02/29/20 0504 03/01/20 0500  WBC 7.0  --   --  6.3 5.5 6.3 18.5*  CREATININE 0.91   < > 0.83 0.86 0.96 1.04 0.88   < > = values in this interval not displayed.    Estimated Creatinine Clearance: 56.9 mL/min (by C-G formula based on SCr of 0.88 mg/dL).    No Known Allergies  Antimicrobials this admission: Cefepime/Vancomycin 3/6>3/8 Zosyn 3/8 >> 3/15  Ambisome 3/11>> Flucytosine 3/11>>   Jimmy Footman, PharmD, BCPS, BCIDP Infectious Diseases Clinical Pharmacist Phone: [phone] Please check AMION for all [facility] contact numbers Clinical Pharmacist 03/01/2020 9:22 AM

## 2020-03-01 NOTE — Plan of Care (Signed)
  Problem: Consults Goal: RH GENERAL PATIENT EDUCATION Description: See Patient Education module for education specifics. Outcome: Progressing Goal: Skin Care Protocol Initiated - if Braden Score 18 or less Description: If consults are not indicated, leave blank or document N/A Outcome: Progressing Goal: Nutrition Consult-if indicated Outcome: Progressing   Problem: RH BOWEL ELIMINATION Goal: RH STG MANAGE BOWEL WITH ASSISTANCE Description: STG Manage Bowel with mod I Assistance. Outcome: Progressing Goal: RH STG MANAGE BOWEL W/MEDICATION W/ASSISTANCE Description: STG Manage Bowel with Medication with mod I Assistance. Outcome: Progressing   Problem: RH BLADDER ELIMINATION Goal: RH STG MANAGE BLADDER WITH ASSISTANCE Description: STG Manage Bladder With min Assistance Outcome: Progressing Goal: RH STG MANAGE BLADDER WITH EQUIPMENT WITH ASSISTANCE Description: STG Manage Bladder With Equipment With min Assistance Outcome: Progressing   Problem: RH SKIN INTEGRITY Goal: RH STG SKIN FREE OF INFECTION/BREAKDOWN Description: Will have no new areas of break down Outcome: Progressing Goal: RH STG MAINTAIN SKIN INTEGRITY WITH ASSISTANCE Description: STG Maintain Skin Integrity With mod I Assistance. Outcome: Progressing Goal: RH STG ABLE TO PERFORM INCISION/WOUND CARE W/ASSISTANCE Description: STG Able To Perform Incision/Wound Care With mod I Assistance. Outcome: Progressing   Problem: RH SAFETY Goal: RH STG ADHERE TO SAFETY PRECAUTIONS W/ASSISTANCE/DEVICE Description: STG Adhere to Safety Precautions With Assistance/Device. Outcome: Progressing   Problem: RH PAIN MANAGEMENT Goal: RH STG PAIN MANAGED AT OR BELOW PT'S PAIN GOAL Description: Pain scale <2/10 Outcome: Progressing   Problem: RH KNOWLEDGE DEFICIT GENERAL Goal: RH STG INCREASE KNOWLEDGE OF SELF CARE AFTER HOSPITALIZATION Outcome: Progressing

## 2020-03-01 NOTE — Progress Notes (Signed)
[facility] PHYSICAL MEDICINE & REHABILITATION PROGRESS NOTE   Subjective/Complaints: Mr. Vantol is on commode having BM. Taking some time. OT reports that he has been doing well with tasks but taking a long time to get them done. He denies pain. Poor sleep last night but prefers no sleep aides.   ROS: Patient denies fever, rash, sore throat, blurred vision, nausea, vomiting, diarrhea, cough, shortness of breath or chest pain, joint or back pain, headache, or mood change.   Objective:   No results found. Recent Labs    02/29/20 0504 03/01/20 0500  WBC 6.3 18.5*  HGB 10.5* 7.4*  HCT 32.0* 25.3*  PLT 257 419*   Recent Labs    02/29/20 0504 03/01/20 0500  NA 135 134*  K 4.0 3.7  CL 103 102  CO2 22 24  GLUCOSE 101* 90  BUN 21 16  CREATININE 1.04 0.88  CALCIUM 9.2 9.2    Intake/Output Summary (Last 24 hours) at 03/01/2020 1001 Last data filed at 03/01/2020 0855 Gross per 24 hour  Intake 2037 ml  Output 775 ml  Net 1262 ml     Physical Exam: Vital Signs Blood pressure 139/79, pulse 84, temperature 97.9 F (36.6 C), resp. rate 18, height 5\' 6"  (1.676 m), weight 53 kg, SpO2 100 %.  Constitutional: No distress . Vital signs reviewed. On commode, trying to have BM.  HEENT: EOMI, oral membranes moist Neck: supple Cardiovascular: RRR without murmur. No JVD    Respiratory/Chest: CTA Bilaterally without wheezes or rales. Normal effort    GI/Abdomen: BS +, non-tender, non-distended Ext: no clubbing, cyanosis, or edema Psych: cooperative but flat Skin: foam dressing on sacrum, stable appearance Neurologic: oriented to person, hospital. Cranial nerves II through XII intact, motor strength is 4/5 in bilateral deltoid, bicep, tricep, grip, hip flexor, knee extensors, ankle dorsiflexor and plantar flexor. Senses pain in all 4's. More alert, still apraxic but seems to be processing better and more aware. Slow processing.  Musculoskeletal: Full range of motion in all 4  extremities. No joint swelling  Assessment/Plan: 1. 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  Physiatrist is providing close team supervision and 24 hour management of active medical problems listed below.  Physiatrist and rehab team continue to assess barriers to discharge/monitor patient progress toward functional and medical goals  Care Tool:  Bathing    Body parts bathed by patient: Right arm, Left arm, Chest, Abdomen, Right upper leg, Left upper leg, Front perineal area, Face   Body parts bathed by helper: Left lower leg, Right lower leg, Buttocks     Bathing assist Assist Level: Moderate Assistance - Patient 50 - 74%     Upper Body Dressing/Undressing Upper body dressing   What is the patient wearing?: Hospital gown only    Upper body assist Assist Level: Maximal Assistance - Patient 25 - 49%    Lower Body Dressing/Undressing Lower body dressing      What is the patient wearing?: Incontinence brief     Lower body assist Assist for lower body dressing: Maximal Assistance - Patient 25 - 49%     Toileting Toileting    Toileting assist Assist for toileting: Maximal Assistance - Patient 25 - 49%     Transfers Chair/bed transfer  Transfers assist  Chair/bed transfer activity did not occur: Safety/medical concerns  Chair/bed transfer assist level: Minimal Assistance - Patient >  75%     Locomotion Ambulation   Ambulation assist      Assist level: Minimal Assistance - Patient > 75% Assistive device: Walker-rolling Max distance: 15 ft   Walk 10 feet activity   Assist  Walk 10 feet activity did not occur: Safety/medical concerns  Assist level: Minimal Assistance - Patient > 75% Assistive device: Walker-rolling   Walk 50 feet activity   Assist Walk 50 feet with 2 turns activity did not  occur: Safety/medical concerns  Assist level: Minimal Assistance - Patient > 75% Assistive device: Walker-rolling    Walk 150 feet activity   Assist Walk 150 feet activity did not occur: Safety/medical concerns  Assist level: Minimal Assistance - Patient > 75% Assistive device: Walker-rolling    Walk 10 feet on uneven surface  activity   Assist Walk 10 feet on uneven surfaces activity did not occur: Safety/medical concerns         Wheelchair     Assist Will patient use wheelchair at discharge?: No(per PT eval)             Wheelchair 50 feet with 2 turns activity    Assist            Wheelchair 150 feet activity     Assist          Blood pressure 139/79, pulse 84, temperature 97.9 F (36.6 C), resp. rate 18, height 5\' 6"  (1.676 m), weight 53 kg, SpO2 100 %.  Medical Problem List and Plan: 1.  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  -Continue CIR PT, OT, SLP  2. DVT/PE/ Antithrombotics: -DVT/anticoagulation:  Pharmaceutical: Other (comment)--on Eliquis- Now on Heparin IV since pre-procedure 3/21--continue pending repeat LP  -have reached out to ID re: plan for follow up LP. Would hate to continue him on IV heparin for 2+ weeks time.              -antiplatelet therapy: N/a 3. Pain Management: N/a 4. Mood: LCSW to monitor for evaluation and support when appropriate.              -antipsychotic agents: N/A 5. Neuropsych: This patient is not capable of making decisions on his own behalf. 6. Skin/Wound Care: routine pressure relief measures.  7. Fluids/Electrolytes/Nutrition: Monitor I/O. Check daily labs  -severe protein  deficient malnutrition  -dietary supplements, RD following  3/23: -intake poor to inconsistent at present although ate better this morning. Consider megace trial.    -I personally reviewed the patient's labs today.   8. Disseminated cryptococcal infection/meningoencephalitis: On Amphotericin B daily and Flucytosine every 6 hours--  daily BMP, Mg and K+ as well as periodic LFTs.     3/22: Repeat LP 3/21: glucose 27, protein 145, wbc 104, cx pending  -repeat LP prior to discharge per ID  -plan for 28 days of iv ampho (through 4/8) prior to change to long term fluconazole  -LFT's sl elevated but stable from 3/19 9. Hyponatremia: 135 3/23 10. Myasthenia gravis: Has been off Cellcept since Covid. Had been on prednisone long term prior to cellcept. Would not recommend tapering all the way off  prednisone at this time. Continue 5mg  daily   - WIFE wants pt's Neurologist- a Emergency planning/management officer on Myasthenia gravis called with any med changes- Dr Westley Foots- [phone]  11. Post Covid hypoxia: Has resolved--current O2 sats on room ar 99%- continue to monitor.  Breathing comfortably.  12. Hypokalemia: Due to meds--being supplemented intermittently.   3.7 on 3/24 13. Stage I pressure ulcer and skin tears-   routine skin care and call WOC if needed.    -appropriate turning  -improve nutrition as above  -continue foam dressing. 14. Leukocytosis: 3/24: significant jump from 6.3 to 18.4. On prednisone. Continue to monitor.  LOS: 6 days A FACE TO FACE EVALUATION WAS PERFORMED  Libra Gatz P Arush Gatliff 03/01/2020, 10:01 AM

## 2020-03-02 ENCOUNTER — Inpatient Hospital Stay (HOSPITAL_COMMUNITY): Payer: Medicare HMO | Admitting: Speech Pathology

## 2020-03-02 ENCOUNTER — Inpatient Hospital Stay (HOSPITAL_COMMUNITY): Payer: Medicare HMO | Admitting: Occupational Therapy

## 2020-03-02 ENCOUNTER — Inpatient Hospital Stay (HOSPITAL_COMMUNITY): Payer: Medicare HMO | Admitting: Physical Therapy

## 2020-03-02 LAB — CBC
HCT: 29.2 % — ABNORMAL LOW (ref 39.0–52.0)
Hemoglobin: 9.7 g/dL — ABNORMAL LOW (ref 13.0–17.0)
MCH: 33.4 pg (ref 26.0–34.0)
MCHC: 33.2 g/dL (ref 30.0–36.0)
MCV: 100.7 fL — ABNORMAL HIGH (ref 80.0–100.0)
Platelets: 243 10*3/uL (ref 150–400)
RBC: 2.9 MIL/uL — ABNORMAL LOW (ref 4.22–5.81)
RDW: 15.8 % — ABNORMAL HIGH (ref 11.5–15.5)
WBC: 5 10*3/uL (ref 4.0–10.5)
nRBC: 0 % (ref 0.0–0.2)

## 2020-03-02 LAB — BASIC METABOLIC PANEL
Anion gap: 7 (ref 5–15)
BUN: 17 mg/dL (ref 8–23)
CO2: 23 mmol/L (ref 22–32)
Calcium: 9 mg/dL (ref 8.9–10.3)
Chloride: 105 mmol/L (ref 98–111)
Creatinine, Ser: 0.91 mg/dL (ref 0.61–1.24)
GFR calc Af Amer: >60 mL/min (ref >60)
GFR calc non Af Amer: >60 mL/min (ref >60)
Glucose, Bld: 96 mg/dL (ref 70–99)
Potassium: 3.9 mmol/L (ref 3.5–5.1)
Sodium: 135 mmol/L (ref 135–145)

## 2020-03-02 LAB — MAGNESIUM: Magnesium: 1.9 mg/dL (ref 1.7–2.4)

## 2020-03-02 MED ORDER — MAGNESIUM SULFATE 2 GM/50ML IV SOLN
2.0000 g | Freq: Once | INTRAVENOUS | Status: AC
  Administered 2020-03-02: 2 g via INTRAVENOUS
  Filled 2020-03-02: qty 50

## 2020-03-02 NOTE — Discharge Instructions (Addendum)
Inpatient Rehab Discharge Instructions  ARIANA CAVENAUGH Discharge date and time: 03/15/20    Activities/Precautions/ Functional Status: Activity: no lifting, driving, or strenuous exercise for till cleared by MD Diet: regular diet Wound Care: keep wound clean and dry    Functional status:  ___ No restrictions     ___ Walk up steps independently _X__ 24/7 supervision/assistance   ___ Walk up steps with assistance ___ Intermittent supervision/assistance  ___ Bathe/dress independently ___ Walk with walker     __X_ Bathe/dress with assistance ___ Walk Independently    ___ Shower independently ___ Walk with assistance    ___ Shower with assistance _X_ No alcohol     ___ Return to work/school ________   Special Instructions: 1. Offer supplements between meals.    COMMUNITY REFERRALS UPON DISCHARGE:    Home Health:   PT   OT    ST   SNA                 Agency:[facility] Phone: [phone]  *Please expect follow-up within two days of your discharge to schedule your home visit. If you have not received follow-up, please be sure to contact the agency directly.*  Medical Equipment/Items Ordered: transport chair, RW                                                 Agency/Supplier: [facility] [phone]   My questions have been answered and I understand these instructions. I will adhere to these goals and the provided educational materials after my discharge from the hospital.  Patient/Caregiver Signature _______________________________ Date __________  Clinician Signature _______________________________________ Date __________  Please bring this form and your medication list with you to all your follow-up doctor's appointments.   Information on my medicine - ELIQUIS (apixaban)  This medication education was reviewed with me or my healthcare representative as part of my discharge preparation.  Why was Eliquis prescribed for you? Eliquis was prescribed to  treat blood clots that may have been found in the veins of your legs (deep vein thrombosis) or in your lungs (pulmonary embolism) and to reduce the risk of them occurring again.  What do You need to know about Eliquis ? The  the dose is ONE 5 mg tablet taken TWICE daily.  Eliquis may be taken with or without food.   Try to take the dose about the same time in the morning and in the evening. If you have difficulty swallowing the tablet whole please discuss with your pharmacist how to take the medication safely.  Take Eliquis exactly as prescribed and DO NOT stop taking Eliquis without talking to the doctor who prescribed the medication.  Stopping may increase your risk of developing a new blood clot.  Refill your prescription before you run out.  After discharge, you should have regular check-up appointments with your healthcare provider that is prescribing your Eliquis.    What do you do if you miss a dose? If a dose of ELIQUIS is not taken at the scheduled time, take it as soon as possible on the same day and twice-daily administration should be resumed. The dose should not be doubled to make up for a missed dose.  Important Safety Information A possible side effect of Eliquis is bleeding. You should call your healthcare provider right away if you experience any of the following: ?  Bleeding from an injury or your nose that does not stop. ? Unusual colored urine (red or dark brown) or unusual colored stools (red or black). ? Unusual bruising for unknown reasons. ? A serious fall or if you hit your head (even if there is no bleeding).  Some medicines may interact with Eliquis and might increase your risk of bleeding or clotting while on Eliquis. To help avoid this, consult your healthcare provider or pharmacist prior to using any new prescription or non-prescription medications, including herbals, vitamins, non-steroidal anti-inflammatory drugs (NSAIDs) and supplements.  This website  has more information on Eliquis (apixaban): http://www.eliquis.com/eliquis/home

## 2020-03-02 NOTE — Progress Notes (Signed)
Occupational Therapy Weekly Progress Note  Patient Details  Name: Duane Santos MRN: [id number] Date of Birth: 02-21-1946  Beginning of progress report period: February 25, 2020 End of progress report period: March 02, 2020  Today's Date: 03/02/2020 OT Individual Time: 0272-5366 OT Individual Time Calculation (min): 72 min    Patient has met 3 of 3 short term goals.  Pt is making steady progress towards OT goals. Pt has demonstrated some improved initiation and problem solving within familiar everyday tasks. Pt continues to be confused and disoriented at times. He is currently a min A level overall for functional BADL tasks. Continue current POC.   Patient continues to demonstrate the following deficits: muscle weakness, decreased cardiorespiratoy endurance, decreased initiation, decreased attention, decreased awareness, decreased problem solving, decreased safety awareness, decreased memory and delayed processing and decreased sitting balance, decreased standing balance, decreased postural control and decreased balance strategies and therefore will continue to benefit from skilled OT intervention to enhance overall performance with BADL and Reduce care partner burden.  Patient progressing toward long term goals..  Continue plan of care.  OT Short Term Goals Week 1:  OT Short Term Goal 1 (Week 1): Pt will complete toilet transfer with min A OT Short Term Goal 1 - Progress (Week 1): Met OT Short Term Goal 2 (Week 1): Pt will complete LB dressing with mod A OT Short Term Goal 2 - Progress (Week 1): Met OT Short Term Goal 3 (Week 1): Pt will self-feed 50% of meal with min A. OT Short Term Goal 3 - Progress (Week 1): Met Week 2:  OT Short Term Goal 1 (Week 2): Pt will maintain stanidng balance within BADL tasks with min A OT Short Term Goal 2 (Week 2): Pt will terminate task at appropriate time with min questioning cues. OT Short Term Goal 3 (Week 2): Pt will tolerate standing for 5 minutes  within grooming task  Skilled Therapeutic Interventions/Progress Updates:    Pt greeted sitting in wc with spouse present and agreeable to OT treatment session. Pt was finishing lunch and had been eating at a very slow pace. Pt's spouse brought in floss for pt as he has been requesting to floss. Pt's spouse asked if this was appropriate for pt to start using. OT felt that pt could definitely use floss at a supervision or set-up A level at this time. Pt declined to brush teeth or floss during OT session. Pt brought down to therapy gym and completed 5 mins x2 on [employer] arm bike for UB strength/endurance.Pt needed verbal cues to recall how much time we had planned to complete on bike. Pt reported pain to L side of neck, so OT performed gentle massage for muscle release. Worked on visual scanning and finger isolation with letter finding game on mirror. Pt needed min verbal cues to locate letters of name in order on mirror. Pt completed overhead ball toss but needed cues each time to remember to pass the ball. Environment was getting increasingly distracting with other patients in the gym with pt experiencing more difficulty maintaining attention to task. Blocked practice for sit<>stand x 5 with pt having similar difficulties remembering hand placement and initiating stand. Pt returned to room and left seated in wc with alarm belt on and spouse present.   Therapy Documentation Precautions:  Precautions Precautions: Fall Restrictions Weight Bearing Restrictions: No Pain: Denies pain  Therapy/Group: Individual Therapy  Valma Cava 03/02/2020, 1:25 PM

## 2020-03-02 NOTE — Progress Notes (Signed)
Speech Language Pathology Daily Session Note  Patient Details  Name: Duane Santos MRN: [id number] Date of Birth: 1946-03-26  Today's Date: 03/02/2020 SLP Individual Time: 0881-1031 SLP Individual Time Calculation (min): 45 min and Today's Date: 03/02/2020 SLP Missed Time: 15 Minutes Missed Time Reason: Patient fatigue  Short Term Goals: Week 1: SLP Short Term Goal 1 (Week 1): Pt will sustain attention to basic task for 2 minutes with Max A cues. SLP Short Term Goal 2 (Week 1): Pt will follow 1 step direcitons in 3 out of 5 opportunities with Max A cues. SLP Short Term Goal 3 (Week 1): Pt will utilize external memory aids to recall orientation information with Max A cues. SLP Short Term Goal 4 (Week 1): With Max A cues, pt will demonstrate intellectual awareness by correctly answering yes/no questions related to physical and cognitive deficits. SLP Short Term Goal 5 (Week 1): Pt will initiate basic familiar task in 3 out of 5 opportunities with Max A cues.  Skilled Therapeutic Interventions: Skilled treatment session focused on cognitive goals. Upon arrival, patient was upright in the wheelchair bu was lethargic. SLP facilitated session by providing Max-Total A verbal and visual cues for problem solving and error awareness during a mildly complex calendar making task. Patient demonstrated increased difficulty with task due to decreased mental flexibility. Function also impacted by fatigue. Patient requested to end session early, therefore, patient missed reamaining 15 minutes of session. Patient transferred back to bed and left supine with alarm on and all needs within reach. Continue with current plan of care.      Pain No/Denies Pain   Therapy/Group: Individual Therapy  Mlissa Tamayo 03/02/2020, 3:03 PM

## 2020-03-02 NOTE — Progress Notes (Signed)
Patient has become more impulsive, attempts to get out of bed more alone and staff responds to alarm going off. When tried to explain to patient about not being able to get out of bed by hisself he becomes a little agitated. Patient also seems agitated with staff being in his room to help toilet even with wife at bedside. No s/s of distress noted at this time. Continue plan of care.  Audie Clear, LPN

## 2020-03-02 NOTE — Progress Notes (Signed)
Social Work Patient ID: Duane Santos, male   DOB: 05/13/1946, 74 y.o.   MRN: [id number]   SW met with pt wife in room to provide updates from team conference and d/c date of 03/15/2020. SW discussed with wife d/c planning and preparing for 24/7 care due to cognition. She intends to work on support that pt will need. She asked to speak with medical team member about concerns that were not shared with SW. SW relayed information to medical team.    Loralee Pacas, MSW, [place] Office: [phone] Cell: [phone] Fax: [phone]

## 2020-03-02 NOTE — Progress Notes (Addendum)
Pharmacy Antibiotic Note  Duane Santos is a 74 y.o. male admitted on 02/24/2020 with meningitis.  Pharmacy has been consulted for Ambisome and flucytosine (D#15) dosing for cryptococcal meningitis.  Assessment: 38 YOM with myasthenia gravis on prednisone/mycophenolate. Noted to have positive cryptococcus Ag and high titer. Started on amphotericin - liposomal and flucytosine evening on 3/11. He had LP and BAL performed 3/12 - CSF cx with yeast . Transferred to inpatient rehab on 3/18. 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  Scr today is stable at 0.91. Would continue pre-dose 500 mL bolus and post-dose 1000 mL bolus fluids. Magnesium is slightly up at 1.9 after 1 gm supplementation yesterday. Potassium is up at 3.9 today after 60 mEq total supplementation yesterday.   Plan: Continue amphotericin B 200 mg IV daily  Continue normal saline 500 mL pre-dose and 1000 mL post-dose for renal protection  Continue potassium 40 mEQ PO daily Give Mg sulfate 2 gm IV X 1  Monitor renal function and electrolytes  Height: _0  (167.6 cm) Weight: 116 lb 13.5 oz (53 kg) IBW/kg (Calculated) : 63.8  Temp (24hrs), Avg:97.9 F (36.6 C), Min:97.6 F (36.4 C), Max:98.4 F (36.9 C)  Recent Labs  Lab 02/27/20 0146 02/28/20 0807 02/29/20 0504 03/01/20 0500 03/02/20 0242  WBC 6.3 5.5 6.3 18.5* 5.0  CREATININE 0.86 0.96 1.04 0.88 0.91    Estimated Creatinine Clearance: 55 mL/min (by C-G formula based on SCr of 0.91 mg/dL).    No Known Allergies  Antimicrobials this admission: Cefepime/Vancomycin 3/6>3/8 Zosyn 3/8 >>3/15  Ambisome 3/11>> Flucytosine 3/11>>  Thank you for allowing pharmacy to be a part of this patient's care.  Claudina Lick, PharmD Candidate  03/02/2020 9:09 AM

## 2020-03-02 NOTE — Progress Notes (Signed)
PHYSICAL MEDICINE & REHABILITATION PROGRESS NOTE   Subjective/Complaints: Up with PT this morning. No complaints. Seems to have slept better. Denies pain  ROS: Patient denies fever, rash, sore throat, blurred vision, nausea, vomiting, diarrhea, cough, shortness of breath or chest pain, joint or back pain, headache, or mood change.    Objective:   No results found. Recent Labs    03/01/20 0500 03/02/20 0242  WBC 18.5* 5.0  HGB 7.4* 9.7*  HCT 25.3* 29.2*  PLT 419* 243   Recent Labs    03/01/20 0500 03/02/20 0242  NA 134* 135  K 3.7 3.9  CL 102 105  CO2 24 23  GLUCOSE 90 96  BUN 16 17  CREATININE 0.88 0.91  CALCIUM 9.2 9.0    Intake/Output Summary (Last 24 hours) at 03/02/2020 1247 Last data filed at 03/02/2020 0930 Gross per 24 hour  Intake 578 ml  Output 1600 ml  Net -1022 ml     Physical Exam: Vital Signs Blood pressure 125/75, pulse 78, temperature 97.8 F (36.6 C), temperature source Oral, resp. rate 18, height 5\' 6"  (1.676 m), weight 53 kg, SpO2 100 %.  Constitutional: No distress . Vital signs reviewed. HEENT: EOMI, oral membranes moist Neck: supple Cardiovascular: RRR without murmur. No JVD    Respiratory/Chest: CTA Bilaterally without wheezes or rales. Normal effort    GI/Abdomen: BS +, non-tender, non-distended Ext: no clubbing, cyanosis, or edema Psych: pleasant and cooperative Skin: foam dressings on arms, sacral area intact Neurologic: oriented to person, hospital with cues, month, day. Quicker to process. Better awareness. Cranial nerves II through XII intact, motor strength is 4/5 in bilateral deltoid, bicep, tricep, grip, hip flexor, knee extensors, ankle dorsiflexor and plantar flexor. Senses pain in all 4's. Still somewhat apraxic Musculoskeletal: full ROM  Assessment/Plan: 1. 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  Physiatrist is providing close team supervision and 24 hour management of active medical problems listed below.  Physiatrist and rehab team continue to assess barriers to discharge/monitor patient progress toward functional and medical goals  Care Tool:  Bathing    Body parts bathed by patient: Right arm, Left arm, Chest, Abdomen, Right upper leg, Left upper leg, Front perineal area, Face   Body parts bathed by helper: Left lower leg, Right lower leg, Buttocks     Bathing assist Assist Level: Moderate Assistance - Patient 50 - 74%     Upper Body Dressing/Undressing Upper body dressing   What is the patient wearing?: Hospital gown only    Upper body assist Assist Level: Maximal Assistance - Patient 25 - 49%    Lower Body Dressing/Undressing Lower body dressing      What is the patient wearing?: Incontinence brief     Lower body assist Assist for lower body dressing: Maximal Assistance - Patient 25 - 49%     Toileting Toileting    Toileting assist Assist for toileting: Maximal Assistance - Patient 25 - 49%     Transfers Chair/bed transfer  Transfers assist  Chair/bed transfer activity did not occur: Safety/medical concerns  Chair/bed transfer assist level: Minimal Assistance - Patient > 75%     Locomotion Ambulation   Ambulation assist      Assist level: Supervision/Verbal cueing Assistive device: Walker-rolling Max distance: 150   Walk 10 feet activity   Assist  Walk 10 feet activity did not  occur: Safety/medical concerns  Assist level: Supervision/Verbal cueing Assistive device: Walker-rolling   Walk 50 feet activity   Assist Walk 50 feet with 2 turns activity did not occur: Safety/medical concerns  Assist level: Supervision/Verbal cueing Assistive device: Walker-rolling    Walk 150 feet activity   Assist Walk 150 feet activity did not occur: Safety/medical concerns  Assist level:  Supervision/Verbal cueing Assistive device: Walker-rolling    Walk 10 feet on uneven surface  activity   Assist Walk 10 feet on uneven surfaces activity did not occur: Safety/medical concerns         Wheelchair     Assist Will patient use wheelchair at discharge?: No(per PT eval)      Wheelchair assist level: Minimal Assistance - Patient > 75% Max wheelchair distance: 100    Wheelchair 50 feet with 2 turns activity    Assist        Assist Level: Minimal Assistance - Patient > 75%   Wheelchair 150 feet activity     Assist      Assist Level: Moderate Assistance - Patient 50 - 74%   Blood pressure 125/75, pulse 78, temperature 97.8 F (36.6 C), temperature source Oral, resp. rate 18, height 5\' 6"  (1.676 m), weight 53 kg, SpO2 100 %.  Medical Problem List and Plan: 1.  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 DVT/PE/ Antithrombotics: -DVT/anticoagulation:  converted back to eliquis  -will need to go back to iv heparin prior to repeat LP             -antiplatelet therapy: N/a 3. Pain Management: N/a 4. Mood: LCSW to monitor for evaluation and support when appropriate.              -antipsychotic agents: N/A 5. Neuropsych: This patient is not capable of making decisions on his own behalf. 6. Skin/Wound Care: routine pressure relief measures.  7. Fluids/Electrolytes/Nutrition: Monitor I/O. Check daily labs  -severe protein deficient malnutrition  -dietary supplements, RD following  3/25: intake picking up. Ate 100% breakfast today 8. Disseminated cryptococcal infection/meningoencephalitis: On Amphotericin B daily and Flucytosine every 6 hours--   daily BMP, Mg and K+ as well as periodic LFTs.     3/22: Repeat LP 3/21: glucose 27, protein 145, wbc 104, cx pending  -repeat LP prior to discharge per ID  -plan for 28 days of iv ampho (through 4/8) prior to change to long term fluconazole  -LFT's sl elevated but stable from 3/19 9. Hyponatremia: 135 3/23 10. Myasthenia gravis: Has been off Cellcept since Covid. Had been on prednisone long term prior to cellcept. Would not recommend tapering all the way off prednisone at this time. Continue 5mg  daily   - WIFE wants pt's Neurologist- a Emergency planning/management officer on Myasthenia gravis called with any med changes- Dr Westley Foots- [phone]  11. Post Covid hypoxia: Has resolved--current O2 sats on room ar 99%- continue to monitor.  Breathing comfortably.  12. Hypokalemia: Due to meds--being supplemented intermittently.   3.7 on 3/24 13. Stage I pressure ulcer and skin tears-   routine skin care and call WOC if needed.  Sacral area appears  improved  -maximize nutrition  -foam dressings to arms 14. Leukocytosis: 3/24: significant jump from 6.3 to 18.4. On prednisone. Continue to monitor.  LOS: 7 days A FACE TO FACE EVALUATION WAS PERFORMED  [name] 03/02/2020, 12:47 PM

## 2020-03-02 NOTE — Progress Notes (Signed)
Nutrition Follow-up  **RD working remotely**  DOCUMENTATION CODES:   Severe malnutrition in context of acute illness/injury  INTERVENTION:   Please obtain updated weight.    Continue MVI  Continue 40ml Pro-stat BID  Continue Ensure Enlive po QID, each supplement provides 350 kcal and 20 grams of protein  Provide [place] yogurt with breakfast   NUTRITION DIAGNOSIS:   Severe Malnutrition related to acute illness(recent covid) as evidenced by percent weight loss, moderate fat depletion, moderate muscle depletion.  Ongoing.  GOAL:   Patient will meet greater than or equal to 90% of their needs  Progressing.  MONITOR:   PO intake, Supplement acceptance, Skin, Labs, Weight trends  REASON FOR ASSESSMENT:   Malnutrition Screening Tool    ASSESSMENT:   Pt with a PMH of Myasthenia gravis, Covid 19 infection 5/79/03 complicated by cor pulmonale/PE/LLE DVT and found to have pulmonary nodules with sclerotic changes in right 3-5th ribs. He was d/c to home 01/03/20 on oxygen, readmitted on 02/09/20 with 2 week hx of progressive confusion and weakness.Pt found to be hyponatremic. MRI brain revealing several small areas of restricted diffusion in bilateral cerebellum. Pt underwent bronchoscopy 3/12 revealing species isolated.  LP showed elevated pressures and yeast noted on gram stain. 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 Therapy ongoing and continues to be debilitated with cognitive deficits and poor safety awareness. Pt admitted to [facility] 3/18.  Pt's wife answered RD phone call; pt eating at time of call. Pt's wife reports pt's appetite is improving and that he is enjoying the Ensure supplements. Pt's wife requesting vanilla Greek yogurt be sent with breakfast trays. RD has made a note in [employer] to include with breakfast.  PO intake: 25-100% x last recorded meals (48% average intake)  UOP: 1736ml x24 hours I/O: +4,114ml since  admit  Labs reviewed. Medications reviewed and include: ensure Enlive QID, 40ml Pro-stat BID, MVI, Klor-con, deltasone, sodium chloride, thiamine  Diet Order:   Diet Order            DIET DYS 3 Room service appropriate? Yes; Fluid consistency: Thin  Diet effective now              EDUCATION NEEDS:   Not appropriate for education at this time  Skin:  Skin Assessment: Skin Integrity Issues: Skin Integrity Issues:: Stage I, Other (Comment) Stage I: bilateral buttocks Other: road rash arm  Last BM:  3/24  Height:   Ht Readings from Last 1 Encounters:  02/24/20 5\' 6"  (1.676 m)    Weight:   Wt Readings from Last 1 Encounters:  02/24/20 53 kg    BMI:  Body mass index is 18.86 kg/m.  Estimated Nutritional Needs:   Kcal:  1600-1800  Protein:  80-90 grams  Fluid:  >1.6L/d   Larkin Ina, MS, RD, LDN RD pager number and weekend/on-call pager number located in [place].

## 2020-03-02 NOTE — Progress Notes (Signed)
Physical Therapy Session Note  Patient Details  Name: Duane Santos MRN: [id number] Date of Birth: Oct 01, 1946  Today's Date: 03/02/2020 PT Individual Time: 0850-0958 PT Individual Time Calculation (min): 68 min   Short Term Goals: Week 1:  PT Short Term Goal 1 (Week 1): Pt will complete bed<>w/c transfers with LRAD & CGA. PT Short Term Goal 2 (Week 1): Pt will ambulate 75 ft with LRAD & CGA. PT Short Term Goal 3 (Week 1): Pt will negotiate 8 steps with B rails with min assist.  Skilled Therapeutic Interventions/Progress Updates:   Pt received sitting in WC and agreeable to PT. PT obtained paper scrubs and assist pt with dressing. Set up assist for clothing orientation and Min assist with sit<>stand to straighten shirt and pull pants to waist. Vitals assessed. HR 101, SpO2 100% on room air  Pt transported to rehab gym. Gait training with RW x 193f and supervision assist for safety and cues for AD management, noted to have decreased gait speed in turns; improved with cues from PT. Vitals assesed in gait SpO2 99%, HR 130-125. Decreased to 111 after 2 min rest break.   Dynamic balance training: Standing on red wedge while performing fine motor/cognitive task of pipe tree x 2. Mod assist initially progressing to CGA to prevent posterior LOB with moderate multimodal cues for anterior weight shifting and increased weight bearing through forefoot.  Forward/revese gait training 143fx 2 each direction. Mod assist on first bout due to festinating gait pattern, improved step length and weight shifting on the second bout.  Side stepping R and L x 12 ft bil. Min assist to the L and mod assist to the R. Mod-max cues for sequencing and multimodal cues for improved lateral weight shift and step length.   Pt reports need for urination. [facility] without AD and CGA. CGA from PT to prevent posterior LOB while urinating.   WC mobility throughout hall x 10069fith  CGA from PT for safety as well as min cues for improved stroke length. Patient returned to room and [name] reach and all needs met.        Therapy Documentation Precautions:  Precautions Precautions: Fall Restrictions Weight Bearing Restrictions: No    Pain: denies   Therapy/Group: Individual Therapy  AusLorie Phenix25/2021, 10:00 AM

## 2020-03-03 ENCOUNTER — Inpatient Hospital Stay (HOSPITAL_COMMUNITY): Payer: Medicare HMO | Admitting: Occupational Therapy

## 2020-03-03 ENCOUNTER — Inpatient Hospital Stay (HOSPITAL_COMMUNITY): Payer: Medicare HMO | Admitting: Speech Pathology

## 2020-03-03 ENCOUNTER — Inpatient Hospital Stay (HOSPITAL_COMMUNITY): Payer: Medicare HMO | Admitting: Physical Therapy

## 2020-03-03 LAB — CBC
HCT: 28.3 % — ABNORMAL LOW (ref 39.0–52.0)
Hemoglobin: 9.3 g/dL — ABNORMAL LOW (ref 13.0–17.0)
MCH: 33.8 pg (ref 26.0–34.0)
MCHC: 32.9 g/dL (ref 30.0–36.0)
MCV: 102.9 fL — ABNORMAL HIGH (ref 80.0–100.0)
Platelets: 225 10*3/uL (ref 150–400)
RBC: 2.75 MIL/uL — ABNORMAL LOW (ref 4.22–5.81)
RDW: 15.9 % — ABNORMAL HIGH (ref 11.5–15.5)
WBC: 4.5 10*3/uL (ref 4.0–10.5)
nRBC: 0 % (ref 0.0–0.2)

## 2020-03-03 LAB — BASIC METABOLIC PANEL
Anion gap: 6 (ref 5–15)
BUN: 19 mg/dL (ref 8–23)
CO2: 25 mmol/L (ref 22–32)
Calcium: 8.9 mg/dL (ref 8.9–10.3)
Chloride: 107 mmol/L (ref 98–111)
Creatinine, Ser: 0.93 mg/dL (ref 0.61–1.24)
GFR calc Af Amer: >60 mL/min (ref >60)
GFR calc non Af Amer: >60 mL/min (ref >60)
Glucose, Bld: 98 mg/dL (ref 70–99)
Potassium: 3.8 mmol/L (ref 3.5–5.1)
Sodium: 138 mmol/L (ref 135–145)

## 2020-03-03 LAB — MAGNESIUM: Magnesium: 2.1 mg/dL (ref 1.7–2.4)

## 2020-03-03 MED ORDER — MUSCLE RUB 10-15 % EX CREA
TOPICAL_CREAM | CUTANEOUS | Status: DC | PRN
  Filled 2020-03-03: qty 85

## 2020-03-03 NOTE — Progress Notes (Signed)
Social Work Patient ID: Duane Santos, male   DOB: 1946/03/17, 74 y.o.   MRN: [id number]   SW returned phone call to [name] [phone]) who wanted to return patient current home o2 since pt has not been using it. She states that she needs a letter from the doctor stating pt no longer requires and she is continuing to be billed for it. SW indicated will follow-up after speaking with medical team.   SW spoke with Gilda/[employer] [phone]) to confirm on above. She states that an order is required. SW informed a letter from the doctor would be accepted even if not an order. SW faxed over order. SW called pt wife to inform on above.   Loralee Pacas, MSW, [place] Office: [phone] Cell: [phone] Fax: [phone]

## 2020-03-03 NOTE — Progress Notes (Signed)
Physical Therapy Weekly Progress Note  Patient Details  Name: Duane Santos MRN: [id number] Date of Birth: [birth year]  Beginning of progress report period: February 25, 2020 End of progress report period: March 03, 2020  Today's Date: 03/03/2020 PT Individual Time: 1050-1200 PT Individual Time Calculation (min): 70 min   Patient has met 2 of 3 short term goals.  Pt is making good progress towards LTG's as he is progressing to supervision<>CGA for gait with RW and transfers. Pt's cognition is slowly improving as well but pt still requires heavy cuing for safety & recall and will need 24 hr supervision at d/c. Pt would benefit from continued skilled PT treatment to focus on the deficits noted above/below, and to increase independence and safety with all functional mobility prior to d/c.   Patient continues to demonstrate the following deficits muscle weakness, decreased cardiorespiratoy endurance, decreased coordination, decreased attention, decreased awareness, decreased problem solving, decreased safety awareness, decreased memory and delayed processing, and decreased standing balance, decreased postural control and decreased balance strategies and therefore will continue to benefit from skilled PT intervention to increase functional independence with mobility.  Patient progressing toward long term goals..  Continue plan of care.  PT Short Term Goals Week 1:  PT Short Term Goal 1 (Week 1): Pt will complete bed<>w/c transfers with LRAD & CGA. PT Short Term Goal 1 - Progress (Week 1): Progressing toward goal PT Short Term Goal 2 (Week 1): Pt will ambulate 75 ft with LRAD & CGA. PT Short Term Goal 2 - Progress (Week 1): Met PT Short Term Goal 3 (Week 1): Pt will negotiate 8 steps with B rails with min assist. PT Short Term Goal 3 - Progress (Week 1): Met Week 2:  PT Short Term Goal 1 (Week 2): Pt will complete car transfer with LRAD & min assist. PT Short Term Goal 2 (Week 2): Pt will negotiate  4 steps with B rails & CGA. PT Short Term Goal 3 (Week 2): Pt will consistently complete bed<>w/c transfers with LRAD & CGA.  Skilled Therapeutic Interventions/Progress Updates:  Pt received asleep in w/c but awakened & agreeable to tx. Pt transfers sit>stand with supervision and cuing to push up on armrest with 1UE. Pt ambulates in room/bathroom with RW & close supervision>CGA 2/2 decreased attention & pt bumping laundry bag on L side on bathroom door. Pt requires CGA for standing balance and extra time to manage clothing, as well as cuing to sit in toilet vs standing to void 2/2 impaired balance. Pt with incontinent void in brief & continent void & BM on toilet with extra time & pt appearing unaware that he had BM & requiring cuing to perform peri hygiene but does so with supervision & significantly extra time with therapist ultimately providing assistance 2/2 pt requiring so much time (some blood noted when cleaning pt 2/2 hemorrhoids & nurse made aware).  Therapist provides assistance with donning new brief for time management and pt performs hand hygiene standing at sink with supervision with cuing to square up to sink. In gym, pt negotiates 8 steps (6") with B rails and min assist with decreased strength & impaired eccentric control when descending stairs. Pt performs 3" step taps without BUE support & min assist for balance with task focusing on weight shifting L<>R, BLE coordination, & dynamic balance with pt demonstrating more impaired weight shifting L than R. Pt ambulates 100 ft without BUE support & min assist with focus on weight shifting & dynamic balance with pt  demonstrating progressive shuffled gait despite cuing for increased step length BLE & increased heel strike. Pt ambulates dayroom>room with RW & CGA with decreased step length & heel strike LLE despite cuing. At end of session pt left in w/c with chair alarm donned, call bell & all needs in reach.  Pt oriented to year on this date but  requires cuing to use sign on wall to recall current location.  Therapy Documentation Precautions:  Precautions Precautions: Fall Restrictions Weight Bearing Restrictions: No   Pain: Pt reports he has a HA but it's "not too bad" - pain meds requested   Therapy/Group: Individual Therapy  [name] 03/03/2020, 12:22 PM

## 2020-03-03 NOTE — Progress Notes (Signed)
Pharmacy Antibiotic Note  Duane Santos a73 y.o.maleadmittedon 3/18/2021with meningitis. Pharmacy has been consulted for Ambisome and flucytosine (D#16) dosing for cryptococcal meningitis.  Assessment: 50 YOM with myasthenia gravis on prednisone/mycophenolate. Noted to have positive cryptococcus Ag and high titer.Started on amphotericin - liposomal and flucytosine evening on 3/11. He had LP and BAL performed 3/12 - CSF cx with yeast. Transferred to inpatient rehab on 3/18. 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  Scr today isstable at 0.93. Would continue pre-dose 500 mL bolus and post-dose 1000 mL bolus fluids.Magnesium is wnl at 2.1 after 2 gm supplementation yesterday. Potassium is wnl at 3.8 today on 40 mEq supplementation daily.   Plan: Continue amphotericin B 200 mg IV daily  Continue normal saline 500 mL pre-dose and 1000 mL post-dose for renal protection  Continue potassium 40 mEQ PO daily  Monitor renal function, electrolytes, clinical improvement   Height: _0  (167.6 cm) Weight: 116 lb 13.5 oz (53 kg) IBW/kg (Calculated) : 63.8  Temp (24hrs), Avg:98.2 F (36.8 C), Min:98.1 F (36.7 C), Max:98.2 F (36.8 C)  Recent Labs  Lab 02/28/20 0807 02/29/20 0504 03/01/20 0500 03/02/20 0242 03/03/20 0430  WBC 5.5 6.3 18.5* 5.0 4.5  CREATININE 0.96 1.04 0.88 0.91 0.93    Estimated Creatinine Clearance: 53.8 mL/min (by C-G formula based on SCr of 0.93 mg/dL).    No Known Allergies  Antimicrobials this admission: Ambisome 3/11>> Flucytosine 3/11>> Cefepime/Vancomycin 3/6>3/8 Zosyn 3/8 >>3/15   Thank you for allowing pharmacy to be a part of this patient's care.  Claudina Lick, PharmD Candidate  03/03/2020 6:30 AM

## 2020-03-03 NOTE — Progress Notes (Signed)
Occupational Therapy Session Note  Patient Details  Name: Duane Santos MRN: [id number] Date of Birth: [birth year]  Today's Date: 03/03/2020 OT Individual Time: 0848-1000 OT Individual Time Calculation (min): 72 min    Short Term Goals: Week 2:  OT Short Term Goal 1 (Week 2): Pt will maintain stanidng balance within BADL tasks with min A OT Short Term Goal 2 (Week 2): Pt will terminate task at appropriate time with min questioning cues. OT Short Term Goal 3 (Week 2): Pt will tolerate standing for 5 minutes within grooming task  Skilled Therapeutic Interventions/Progress Updates:    Pt greeted asleep in bed with urinal placed, but eyes closed. Pt had already urinated successfully in urinal. Pt easy to wake and remembered he had urinal placed and handed it to therapist to empty. Pt came to sitting EOB with increased time and supervision. Stand-pivot to wc with CGA. Worked on sequencing and task termination with toothbrushing task at the sink. Pt needed cues to limit toothbrushing to 10 minutes with OT trying to get pt to finish up toothbrushing at 5 minute mark. Pt furstrated that he does not have floss to complete his oral care routine. Wife brought floss in yesterday but OT was unable to locate it.  Pt stood at the sink and was able to wash peri-area and buttocks with set-up A and CGA for balance in standing. While standing to wash peri-area, pt began urinating and was unaware. OT brought pt's attention to the fact that he was urinating on the floor. OT had pt wash peri-area again and OT placed clean brief. OT cleaned floor, then worked on [employer] dressing strategies.  PT able to reach forward and initiate threading pants today. CGA to stand and pull them up. Pt donned clean socks in figure 4 position with set-up A. Pt ambulated in hallway with RW and CGA, but increased difficulty in more distracting environment and with more fatigue. Pt was able to pathfind back to room with min cues, but had difficulty  maintaining RW positioning when turning to get to the room. Pt left seated in wc with alarm belt on, call bell in reach, and needs met.   Therapy Documentation Precautions:  Precautions Precautions: Fall Restrictions Weight Bearing Restrictions: No Pain:   Pt reports soreness   Therapy/Group: Individual Therapy  Valma Cava 03/03/2020, 10:02 AM

## 2020-03-03 NOTE — Progress Notes (Signed)
Duane Santos PHYSICAL MEDICINE & REHABILITATION PROGRESS NOTE   Subjective/Complaints: Up in bed. Says he didn't sleep too well. Neck was sore (chronic problem). Otherwise doing ok this morning  ROS: Patient denies fever, rash, sore throat, blurred vision, nausea, vomiting, diarrhea, cough, shortness of breath or chest pain, headache, or mood change.   Objective:   No results found. Recent Labs    03/02/20 0242 03/03/20 0430  WBC 5.0 4.5  HGB 9.7* 9.3*  HCT 29.2* 28.3*  PLT 243 225   Recent Labs    03/02/20 0242 03/03/20 0430  NA 135 138  K 3.9 3.8  CL 105 107  CO2 23 25  GLUCOSE 96 98  BUN 17 19  CREATININE 0.91 0.93  CALCIUM 9.0 8.9    Intake/Output Summary (Last 24 hours) at 03/03/2020 1144 Last data filed at 03/03/2020 0615 Gross per 24 hour  Intake 444 ml  Output 1125 ml  Net -681 ml     Physical Exam: Vital Signs Blood pressure 113/71, pulse 98, temperature 98.2 F (36.8 C), temperature source Oral, resp. rate 18, height 5\' 6"  (1.676 m), weight 53 kg, SpO2 96 %.  Constitutional: No distress . Vital signs reviewed. HEENT: EOMI, oral membranes moist Neck: supple Cardiovascular: RRR without murmur. No JVD    Respiratory/Chest: CTA Bilaterally without wheezes or rales. Normal effort    GI/Abdomen: BS +, non-tender, non-distended Ext: no clubbing, cyanosis, or edema Psych: pleasant and cooperative Skin: foam dressings on arms, sacral area intact Neurologic: oriented to person, but not month/day. Very alert but poor awareness today. Cranial nerves II through XII intact, motor strength is 4/5 in bilateral deltoid, bicep, tricep, grip, hip flexor, knee extensors, ankle dorsiflexor and plantar flexor. Senses pain in all 4's. Remains apraxic Musculoskeletal: full ROM, pain along right SCM with palpation and rotation of neck  Assessment/Plan: 1. 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  Physiatrist is providing close team supervision and 24 hour management of active medical problems listed below.  Physiatrist and rehab team continue to assess barriers to discharge/monitor patient progress toward functional and medical goals  Care Tool:  Bathing    Body parts bathed by patient: Right arm, Left arm, Chest, Abdomen, Right upper leg, Left upper leg, Front perineal area, Face   Body parts bathed by helper: Left lower leg, Right lower leg, Buttocks     Bathing assist Assist Level: Moderate Assistance - Patient 50 - 74%     Upper Body Dressing/Undressing Upper body dressing   What is the patient wearing?: Hospital gown only    Upper body assist Assist Level: Maximal Assistance - Patient 25 - 49%    Lower Body Dressing/Undressing Lower body dressing      What is the patient wearing?: Incontinence brief     Lower body assist Assist for lower body dressing: Maximal Assistance - Patient 25 - 49%     Toileting Toileting    Toileting assist Assist for toileting: Maximal Assistance - Patient 25 - 49%     Transfers Chair/bed transfer  Transfers assist  Chair/bed transfer activity did not occur: Safety/medical concerns  Chair/bed transfer assist level: Minimal Assistance - Patient > 75%     Locomotion Ambulation   Ambulation assist      Assist level: Supervision/Verbal cueing Assistive device: Walker-rolling Max distance: 150   Walk 10 feet activity  Assist  Walk 10 feet activity did not occur: Safety/medical concerns  Assist level: Supervision/Verbal cueing Assistive device: Walker-rolling   Walk 50 feet activity   Assist Walk 50 feet with 2 turns activity did not occur: Safety/medical concerns  Assist level: Supervision/Verbal cueing Assistive device: Walker-rolling    Walk 150 feet activity   Assist Walk 150 feet activity did not occur:  Safety/medical concerns  Assist level: Supervision/Verbal cueing Assistive device: Walker-rolling    Walk 10 feet on uneven surface  activity   Assist Walk 10 feet on uneven surfaces activity did not occur: Safety/medical concerns         Wheelchair     Assist Will patient use wheelchair at discharge?: No(per PT eval)      Wheelchair assist level: Minimal Assistance - Patient > 75% Max wheelchair distance: 100    Wheelchair 50 feet with 2 turns activity    Assist        Assist Level: Minimal Assistance - Patient > 75%   Wheelchair 150 feet activity     Assist      Assist Level: Moderate Assistance - Patient 50 - 74%   Blood pressure 113/71, pulse 98, temperature 98.2 F (36.8 C), temperature source Oral, resp. rate 18, height 5\' 6"  (1.676 m), weight 53 kg, SpO2 96 %.  Medical Problem List and Plan: 1.  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 DVT/PE/ Antithrombotics: -DVT/anticoagulation:  converted back to eliquis  -will need to go back to iv heparin prior to repeat LP             -antiplatelet therapy: N/a 3. Pain Management: add sports rub for right SCM tenderness  -kpad 4. Mood: LCSW to monitor for evaluation and support when appropriate.              -antipsychotic agents: N/A 5. Neuropsych: This patient is not capable of making decisions on his own behalf. 6. Skin/Wound Care: routine pressure relief measures.  7. Fluids/Electrolytes/Nutrition: Monitor I/O. Check daily labs  -severe protein deficient malnutrition  -dietary supplements, RD following  3/26 intake improving  35-100% currently 8. Disseminated cryptococcal  infection/meningoencephalitis: On Amphotericin B daily and Flucytosine every 6 hours--  daily BMP, Mg and K+ as well as periodic LFTs.     3/22: Repeat LP 3/21: glucose 27, protein 145, wbc 104, cx pending  -repeat LP prior to discharge per ID  -plan for 28 days of iv ampho (through 4/8) prior to change to long term fluconazole  -LFT's sl elevated but stable from 3/19--recheck Monday 9. Hyponatremia: 138 3/26  -recheck Monday 10. Myasthenia gravis: Has been off Cellcept since Covid. Had been on prednisone long term prior to cellcept. Would not recommend tapering all the way off prednisone at this time. Continue 5mg  daily   - WIFE wants pt's Neurologist- a Emergency planning/management officer on Myasthenia gravis called with any med changes- Dr Westley Foots- [phone]  11. Post Covid hypoxia: Has resolved--current O2 sats on room ar 99%- continue to monitor.  Breathing comfortably.  12. Hypokalemia: Due to meds--being supplemented intermittently.   3.8 on 3/26  -recheck Monday 13. Stage  I pressure ulcer and skin tears-   routine skin care and call WOC if needed.  Sacral area appears improved  -maximize nutrition  -foam dressings to arms 14. Leukocytosis: 4.5 3/26   Likely steroid related  -afebrile, abx as above  LOS: 8 days A FACE TO FACE EVALUATION WAS PERFORMED  [name] 03/03/2020, 11:44 AM

## 2020-03-03 NOTE — Progress Notes (Signed)
Speech Language Pathology Weekly Progress and Session Note  Patient Details  Name: Duane Santos MRN: [id number] Date of Birth: 08-Aug-1946  Beginning of progress report period: February 25, 2020 End of progress report period: March 03, 2020  Today's Date: 03/03/2020 SLP Individual Time: 1415-1445 SLP Individual Time Calculation (min): 30 min  Patient Missed Time: 15 minutes  Short Term Goals: Week 1: SLP Short Term Goal 1 (Week 1): Pt will sustain attention to basic task for 2 minutes with Max A cues. SLP Short Term Goal 1 - Progress (Week 1): Met SLP Short Term Goal 2 (Week 1): Pt will follow 1 step direcitons in 3 out of 5 opportunities with Max A cues. SLP Short Term Goal 2 - Progress (Week 1): Met SLP Short Term Goal 3 (Week 1): Pt will utilize external memory aids to recall orientation information with Max A cues. SLP Short Term Goal 3 - Progress (Week 1): Met SLP Short Term Goal 4 (Week 1): With Max A cues, pt will demonstrate intellectual awareness by correctly answering yes/no questions related to physical and cognitive deficits. SLP Short Term Goal 4 - Progress (Week 1): Met SLP Short Term Goal 5 (Week 1): Pt will initiate basic familiar task in 3 out of 5 opportunities with Max A cues. SLP Short Term Goal 5 - Progress (Week 1): Met    New Short Term Goals: Week 2: SLP Short Term Goal 1 (Week 2): Pt will sustain attention to basic task for 15  minutes with Max A cues. SLP Short Term Goal 2 (Week 2): Pt will utilize external memory aids to recall orientation information with Mod A cues. SLP Short Term Goal 3 (Week 2): Patient will verbalize 2 physical and 2 cognitive deficits with Max A mulitmodal cues. SLP Short Term Goal 4 (Week 2): Patient will demonstrate functional problem solving for basic and familiar tasks with Max A verbal cues.  Weekly Progress Updates: Patient has made functional gains and has met 5 of 5 STGs this reporting period. Currently, patient requires overall  Mod-Max A multimodal cues to complete functional and familiar tasks in regards to problem solving, intellectual awareness, recall with use of strategies and sustained attention. Patient's overall cognitive functioning can fluctuate, especially with increased lethargy. Patient and family education ongoing. Patient would benefit from continued skilled SLP intervention to maximize his cognitive functioning and overall functional independence prior to discharge.    Intensity: Minumum of 1-2 x/day, 30 to 90 minutes Frequency: 3 to 5 out of 7 days Duration/Length of Stay: 03/15/20 Treatment/Interventions: Functional tasks;Patient/family education;Cognitive remediation/compensation;Cueing hierarchy;Environmental controls;Therapeutic Activities;Internal/external aids   Daily Session  Skilled Therapeutic Interventions: Skilled treatment session focused on cognitive goals. Upon arrival, patient was on the commode.  Patient required more than a reasonable amount of time and Max verbal cues for termination of peri-care. Max verbal cues were also required for sequencing with washing hands. Patient's chair cushion was wet (due to incontinence), therefore, patient was transferred to the bed. Patient required Max verbal cues for sustianed attention to cognitive task and did not answer SLP's questions despite Max A multimodal cues. Patient reported, "I'm tired." Patient missed remaining 15 minutes of session due to fatigue. Patient left upright in bed with alarm on and all needs within reach. Continue with current plan of care.      Pain No/Denies Pain   Therapy/Group: Individual Therapy  Ambers Iyengar 03/03/2020, 6:45 AM

## 2020-03-03 NOTE — Progress Notes (Signed)
[facility] for [facility]ious Disease  Date of Admission:  02/24/2020      Total days of antibiotics 19   Day 16 Flucytosine and L-Ampho    ASSESSMENT: Duane Santos is a 74 y.o. 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 He is HIV negative but on chronic therapy with Prednisone / Mycophenolate for myasthenia gravis and s/p COVID infection with tocilizumab therapy in January.  Opening pressures x 2 were < 8 cm water.  CSF initially grew cryptococcus, but second sampling sterile.   Currently on day 16 of induction therapy and mentally he appears improved and his responses are quicker and more coordinated. Observed walking in therapy today and doing well with rehab. He has no chest pain or cough - feels his shortness of breath is related more to deconditioning.  Plan to complete 21 days and reassess prior to long term fluconazole.    Continue aggressive electrolyte replacement / monitoring while on L-Ampho. Leukocytosis resolved.    PLAN: 1. Continue L-ampho and flucytosine 2. Continue aggressive renal and electrolyte monitoring  Dr. Tommy Medal is on call this weekend for questions @ [phone] - otherwise will see him back on Monday.    Principal Problem:   Cryptococcal meningitis ([place]) Active Problems:   Myasthenia gravis (HCC)   Encephalitis   Severe malnutrition (HCC)   Stage I pressure ulcer of sacral region   Protein-calorie malnutrition, severe   . apixaban  5 mg Oral BID  . butamben-tetracaine-benzocaine  1 spray Topical Once  . Chlorhexidine Gluconate Cloth  6 each Topical Daily  . dextrose  10 mL Intravenous Q24H  . dextrose  10 mL Intravenous Q24H  . feeding supplement (ENSURE ENLIVE)  237 mL Oral QID  . feeding supplement (PRO-STAT SUGAR FREE 64)  30 mL Oral BID  . flucytosine  25 mg/kg Oral Q6H  . multivitamin with minerals  1 tablet Oral Daily  . potassium chloride  40 mEq Oral Daily  .  predniSONE  5 mg Oral Q breakfast  . sodium chloride  1,000 mL Intravenous Q24H  . sodium chloride  500 mL Intravenous Q24H  . sodium chloride flush  10-40 mL Intracatheter Q12H  . sodium chloride  1 g Oral TID WC  . thiamine  100 mg Oral Daily    SUBJECTIVE: Feeling better today. Feels his brain fog is better but still with short term memory problems. He says he has some "intermittent hearing problems" almost described to be like deafness. No tinnitus. He cannot elaborate.   CSF 3/11 with Cryptococcal Ag titer 1:2560  OP 9 cm water, Glucose 21, Protein 254, WBC 72 >> 62, Cx positive for growth  CSF 3/21 with OP 7 cm water, Glucose 27, Protein 145, WBC 104 (82% lymphs); cultures no growth final   Cryptococcus neoformans in sputum as well    Review of Systems: Review of Systems  Constitutional: Negative for chills and fever.  HENT: Negative for tinnitus.   Eyes: Negative for blurred vision and photophobia.  Respiratory: Negative for cough and sputum production.   Cardiovascular: Negative for chest pain.  Gastrointestinal: Negative for diarrhea, nausea and vomiting.  Genitourinary: Negative for dysuria.  Skin: Negative for rash.  Neurological: Positive for headaches.  Psychiatric/Behavioral: Positive for memory loss. The patient is nervous/anxious.     No Known Allergies  OBJECTIVE: Vitals:   03/02/20 0543 03/02/20 1428 03/02/20 2007 03/03/20 1410  BP: 125/75 Marland Kitchen)  112/59 113/71 126/75  Pulse: 78 (!) 101 98 90  Resp: 18 20 18 16   Temp: 97.8 F (36.6 C) 98.1 F (36.7 C) 98.2 F (36.8 C) 97.8 F (36.6 C)  TempSrc: Oral  Oral   SpO2: 100% 100% 96% 100%  Weight:      Height:       Body mass index is 18.86 kg/m.  Physical Exam Vitals and nursing note reviewed.  Constitutional:      Appearance: He is well-developed.     Comments: Seated comfortably in chair during visit.   HENT:     Mouth/Throat:     Mouth: Mucous membranes are moist.     Dentition: Normal  dentition. No dental abscesses.     Pharynx: No oropharyngeal exudate.  Cardiovascular:     Rate and Rhythm: Normal rate and regular rhythm.     Heart sounds: Normal heart sounds.  Pulmonary:     Effort: Pulmonary effort is normal.     Breath sounds: Normal breath sounds.  Abdominal:     General: There is no distension.     Palpations: Abdomen is soft.     Tenderness: There is no abdominal tenderness.  Musculoskeletal:        General: Normal range of motion.  Lymphadenopathy:     Cervical: No cervical adenopathy.  Skin:    General: Skin is warm and dry.     Findings: No rash.  Neurological:     Mental Status: He is alert. He is disoriented.     Comments: Generalized weakness. Able to follow commands. Equal movement/strength bilaterally.   Psychiatric:        Judgment: Judgment normal.     Comments: Delayed in some responses      Lab Results Lab Results  Component Value Date   WBC 4.5 03/03/2020   HGB 9.3 (L) 03/03/2020   HCT 28.3 (L) 03/03/2020   MCV 102.9 (H) 03/03/2020   PLT 225 03/03/2020    Lab Results  Component Value Date   CREATININE 0.93 03/03/2020   BUN 19 03/03/2020   NA 138 03/03/2020   K 3.8 03/03/2020   CL 107 03/03/2020   CO2 25 03/03/2020    Lab Results  Component Value Date   ALT 65 (H) 02/28/2020   AST 38 02/28/2020   ALKPHOS 76 02/28/2020   BILITOT 0.7 02/28/2020     Microbiology: Recent Results (from the past 240 hour(s))  CSF culture     Status: None   Collection Time: 02/27/20  1:30 PM   Specimen: PATH Cytology CSF; Cerebrospinal Fluid  Result Value Ref Range Status   Specimen Description CSF  Final   Special Requests NONE  Final   Gram Stain   Final    WBC PRESENT, PREDOMINANTLY MONONUCLEAR NO ORGANISMS SEEN CYTOSPIN SMEAR    Culture   Final    NO GROWTH Performed at [facility], [address] [place], [place] [address]    Report Status 03/01/2020 FINAL  Final  Culture, fungus without smear     Status: None  (Preliminary result)   Collection Time: 02/27/20  1:30 PM   Specimen: PATH Cytology CSF; Cerebrospinal Fluid  Result Value Ref Range Status   Specimen Description CSF  Final   Special Requests NONE  Final   Culture   Final    NO FUNGUS ISOLATED AFTER 5 DAYS Performed at [facility], [place] [address], [place], [place] [address]    Report Status PENDING  Incomplete     Janene Madeira, MSN, NP-C [facility] for Infectious Disease [email or web address] Pager: [phone] Office: [phone] [place]: [phone]

## 2020-03-03 NOTE — Progress Notes (Signed)
Mr. Si Jachim. Mroczkowski is no longer in need of supplemental oxygen.    [name], MD, [facility] 03/03/2020

## 2020-03-04 DIAGNOSIS — E876 Hypokalemia: Secondary | ICD-10-CM

## 2020-03-04 DIAGNOSIS — D539 Nutritional anemia, unspecified: Secondary | ICD-10-CM

## 2020-03-04 DIAGNOSIS — N3944 Nocturnal enuresis: Secondary | ICD-10-CM

## 2020-03-04 DIAGNOSIS — L89151 Pressure ulcer of sacral region, stage 1: Secondary | ICD-10-CM

## 2020-03-04 DIAGNOSIS — R7401 Elevation of levels of liver transaminase levels: Secondary | ICD-10-CM

## 2020-03-04 DIAGNOSIS — G0490 Encephalitis and encephalomyelitis, unspecified: Secondary | ICD-10-CM

## 2020-03-04 LAB — BASIC METABOLIC PANEL
Anion gap: 9 (ref 5–15)
BUN: 17 mg/dL (ref 8–23)
CO2: 25 mmol/L (ref 22–32)
Calcium: 8.9 mg/dL (ref 8.9–10.3)
Chloride: 101 mmol/L (ref 98–111)
Creatinine, Ser: 0.72 mg/dL (ref 0.61–1.24)
GFR calc Af Amer: >60 mL/min (ref >60)
GFR calc non Af Amer: >60 mL/min (ref >60)
Glucose, Bld: 99 mg/dL (ref 70–99)
Potassium: 3.6 mmol/L (ref 3.5–5.1)
Sodium: 135 mmol/L (ref 135–145)

## 2020-03-04 LAB — MAGNESIUM: Magnesium: 2 mg/dL (ref 1.7–2.4)

## 2020-03-04 MED ORDER — POTASSIUM CHLORIDE CRYS ER 20 MEQ PO TBCR
20.0000 meq | EXTENDED_RELEASE_TABLET | Freq: Once | ORAL | Status: AC
  Administered 2020-03-04: 20 meq via ORAL
  Filled 2020-03-04: qty 1

## 2020-03-04 NOTE — Progress Notes (Signed)
[facility]ICAL [facility]ICINE [facility] REHABILITATION PROGRESS NOTE   Subjective/Complaints: Patient seen sitting up in his chair this morning.  He states he slept well overnight.  Overnight, reported to be impulsive.  Patient with frequent episodes of urine incontinence overnight and urinary frequency secondary to IVF.  Discussed condom cath.  Discussed potassium supplementation with nursing as well.  ROS: Denies CP, SOB, N/V/D,?  Reliability.  Objective:   No results found. Recent Labs    03/02/20 0242 03/03/20 0430  WBC 5.0 4.5  HGB 9.7* 9.3*  HCT 29.2* 28.3*  PLT 243 225   Recent Labs    03/03/20 0430 03/04/20 0410  NA 138 135  K 3.8 3.6  CL 107 101  CO2 25 25  GLUCOSE 98 99  BUN 19 17  CREATININE 0.93 0.72  CALCIUM 8.9 8.9    Intake/Output Summary (Last 24 hours) at 03/04/2020 1533 Last data filed at 03/04/2020 0900 Gross per 24 hour  Intake 240 ml  Output 100 ml  Net 140 ml     Physical Exam: Vital Signs Blood pressure 126/74, pulse 86, temperature 97.9 F (36.6 C), temperature source Oral, resp. rate 20, height 5\' 6"  (1.676 m), weight 53 kg, SpO2 100 %. Constitutional: No distress . Vital signs reviewed. HENT: Normocephalic.  Atraumatic. Eyes: EOMI. No discharge. Cardiovascular: No JVD. Respiratory: Normal effort.  No stridor. GI: Non-distended. Skin: Warm and dry.  Intact. Psych: Processing delay. Musc: No edema in extremities.  No tenderness in extremities. Neuro: Alert and oriented x1 Motor: 4-4+/5 throughout  Assessment/Plan: 1. 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  Physiatrist is providing close team supervision and 24 hour management of active medical problems listed below.  Physiatrist and rehab team continue to assess barriers to discharge/monitor patient progress toward  functional and medical goals  Care Tool:  Bathing    Body parts bathed by patient: Right arm, Left arm, Chest, Abdomen, Right upper leg, Left upper leg, Front perineal area, Face   Body parts bathed by helper: Left lower leg, Right lower leg, Buttocks     Bathing assist Assist Level: Moderate Assistance - Patient 50 - 74%     Upper Body Dressing/Undressing Upper body dressing   What is the patient wearing?: Hospital gown only    Upper body assist Assist Level: Maximal Assistance - Patient 25 - 49%    Lower Body Dressing/Undressing Lower body dressing      What is the patient wearing?: Incontinence brief     Lower body assist Assist for lower body dressing: Maximal Assistance - Patient 25 - 49%     Toileting Toileting    Toileting assist Assist for toileting: Maximal Assistance - Patient 25 - 49%     Transfers Chair/bed transfer  Transfers assist  Chair/bed transfer activity did not occur: Safety/medical concerns  Chair/bed transfer assist level: Minimal Assistance - Patient > 75%     Locomotion Ambulation   Ambulation assist      Assist level: Contact Guard/Touching assist Assistive device: Walker-rolling Max distance: 100 ft   Walk 10 feet activity   Assist  Walk 10 feet activity did not occur: Safety/medical concerns  Assist level: Contact Guard/Touching assist Assistive device: Walker-rolling   Walk 50 feet activity   Assist Walk 50 feet with 2 turns activity did not occur: Safety/medical concerns  Assist level: Contact Guard/Touching assist Assistive device:  Walker-rolling    Walk 150 feet activity   Assist Walk 150 feet activity did not occur: Safety/medical concerns  Assist level: Supervision/Verbal cueing Assistive device: Walker-rolling    Walk 10 feet on uneven surface  activity   Assist Walk 10 feet on uneven surfaces activity did not occur: Safety/medical concerns         Wheelchair     Assist Will patient  use wheelchair at discharge?: No(per PT eval)      Wheelchair assist level: Minimal Assistance - Patient > 75% Max wheelchair distance: 100    Wheelchair 50 feet with 2 turns activity    Assist        Assist Level: Minimal Assistance - Patient > 75%   Wheelchair 150 feet activity     Assist      Assist Level: Moderate Assistance - Patient 50 - 74%   Blood pressure 126/74, pulse 86, temperature 97.9 F (36.6 C), temperature source Oral, resp. rate 20, height 5\' 6"  (1.676 m), weight 53 kg, SpO2 100 %.  Medical Problem List and Plan: 1.  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 DVT/PE/ Antithrombotics:  -DVT/anticoagulation:  converted back to Eliquis   -will need to go back to iv heparin prior to repeat LP             -antiplatelet therapy: N/a 3. Pain Management: add sports rub for right SCM tenderness  -kpad  Controlled on 3/27 4. Mood: LCSW to monitor for evaluation and support when appropriate.              -antipsychotic agents: N/A 5. Neuropsych: This patient is not capable of making decisions on his own behalf.  Telesitter for safety 6. Skin/Wound Care: routine pressure relief measures.   Condom catheter MASD 7. Fluids/Electrolytes/Nutrition: Monitor I/Os  -severe protein deficient malnutrition  -dietary supplements, RD following 8. Disseminated cryptococcal infection/meningoencephalitis: On amphotericin B daily and Flucytosine every 6 hours--  daily BMP, Mg and K+ as well as periodic LFTs.     3/22: Repeat LP 3/21: glucose 27, protein 145, wbc 104, CSF culture no growth, fungal cultures pending  -repeat LP prior to discharge per ID  -plan for 28 days of iv ampho (through 4/8) prior to change to long term fluconazole  LFTs elevated on 3/22, labs ordered for Monday 9. Hyponatremia:   Sodium 135 on 3/27 10. Myasthenia  gravis: Has been off Cellcept since Covid. Had been on prednisone long term prior to cellcept. Would not recommend tapering all the way off prednisone at this time. Continue 5mg  daily   - WIFE wants pt's Neurologist- a Emergency planning/management officer on Myasthenia gravis called with any med changes- Dr Westley Foots- [phone]  11. Post Covid hypoxia: Has resolved--current O2 sats on room ar 99%- continue to monitor.  Breathing comfortably.  12. Hypokalemia: Due to meds  Continue supplementation  Potassium 3.6 on 3/27 13. Stage I pressure ulcer and skin tears-   routine skin care and call WOC if needed.  Sacral area appears improved  -maximize nutrition  -foam dressings to arms 14. Leukocytosis: Resolved    Likely steroid related  -afebrile, abx as above 15.  Macrocytic anemia  Hemoglobin 9.3 on 3/26  Labs ordered for Monday 16.  Urinary incontinence-likely secondary to cognition  Condom cath at night  LOS: 9 days A FACE TO FACE EVALUATION WAS PERFORMED   [name] 03/04/2020,  3:33 PM

## 2020-03-04 NOTE — Progress Notes (Signed)
Pt impulsive at beginning of shift. Non-compliant with safety precautions in regards to chair alarm. Pt making threats when given directions pertaining to pts safety.

## 2020-03-04 NOTE — Progress Notes (Signed)
Pharmacy Antibiotic Note Duane Bacha Flemingis a73 y.o.maleadmittedon 3/18/2021with meningitis. 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  Assessment: 77 YOM with myasthenia gravis on prednisone/mycophenolate. Noted to have positive cryptococcus Ag and high titer.Started on amphotericin - liposomal and flucytosine evening on 3/11. He had LP and BAL performed 3/12 - CSF cx with yeast. Transferred to inpatient rehab on 3/18. 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  Scr today isstable at 0.72. Would continue pre-dose 500 mL bolus and post-dose 1000 mL bolus fluids.Magnesium is WNL at 2.0 and potassium is at 3.6 today on 40 mEq supplementation daily.   Plan: Continue amphotericin B 200 mg IV daily  Continue normal saline 500 mL pre-dose and 1000 mL post-dose for renal protection  Continue potassium 40 mEQ PO daily + give an extra potassium 20 mEq PO x1 today Will re-evaluate daily potassium dose needed tomorrow based on BMP  Monitor renal function, electrolytes, clinical improvement   Height: _0  (167.6 cm) Weight: 116 lb 13.5 oz (53 kg) IBW/kg (Calculated) : 63.8  Temp (24hrs), Avg:97.9 F (36.6 C), Min:97.8 F (36.6 C), Max:98.1 F (36.7 C)  Recent Labs  Lab 02/28/20 0807 02/28/20 0807 02/29/20 0504 03/01/20 0500 03/02/20 0242 03/03/20 0430 03/04/20 0410  WBC 5.5  --  6.3 18.5* 5.0 4.5  --   CREATININE 0.96   < > 1.04 0.88 0.91 0.93 0.72   < > = values in this interval not displayed.    Estimated Creatinine Clearance: 62.6 mL/min (by C-G formula based on SCr of 0.72 mg/dL).    No Known Allergies  Antimicrobials this admission: Ambisome 3/11>> Flucytosine 3/11>> Cefepime/Vancomycin 3/6>3/8 Zosyn 3/8 >>3/15   Thank you for allowing pharmacy to be a part of this patient's care.  Kennon Holter, PharmD PGY1 Ambulatory Care Pharmacy Resident 03/04/2020 7:26 AM

## 2020-03-05 ENCOUNTER — Inpatient Hospital Stay (HOSPITAL_COMMUNITY): Payer: Medicare HMO | Admitting: Speech Pathology

## 2020-03-05 DIAGNOSIS — R410 Disorientation, unspecified: Secondary | ICD-10-CM

## 2020-03-05 LAB — BASIC METABOLIC PANEL
Anion gap: 7 (ref 5–15)
BUN: 15 mg/dL (ref 8–23)
CO2: 26 mmol/L (ref 22–32)
Calcium: 9.1 mg/dL (ref 8.9–10.3)
Chloride: 103 mmol/L (ref 98–111)
Creatinine, Ser: 0.79 mg/dL (ref 0.61–1.24)
GFR calc Af Amer: >60 mL/min (ref >60)
GFR calc non Af Amer: >60 mL/min (ref >60)
Glucose, Bld: 101 mg/dL — ABNORMAL HIGH (ref 70–99)
Potassium: 3.9 mmol/L (ref 3.5–5.1)
Sodium: 136 mmol/L (ref 135–145)

## 2020-03-05 LAB — MAGNESIUM: Magnesium: 1.8 mg/dL (ref 1.7–2.4)

## 2020-03-05 MED ORDER — CHLORHEXIDINE GLUCONATE CLOTH 2 % EX PADS
6.0000 | MEDICATED_PAD | Freq: Two times a day (BID) | CUTANEOUS | Status: DC
  Administered 2020-03-05 – 2020-03-15 (×19): 6 via TOPICAL

## 2020-03-05 MED ORDER — SODIUM CHLORIDE 0.9 % IV SOLN: INTRAVENOUS | Status: DC | PRN

## 2020-03-05 MED ORDER — SODIUM CHLORIDE 0.9 % IV BOLUS FOR AMBISOME
1000.0000 mL | INTRAVENOUS | Status: AC
  Administered 2020-03-05 – 2020-03-15 (×10): 1000 mL via INTRAVENOUS

## 2020-03-05 MED ORDER — MAGNESIUM SULFATE IN D5W 1-5 GM/100ML-% IV SOLN
1.0000 g | Freq: Once | INTRAVENOUS | Status: AC
  Administered 2020-03-05: 1 g via INTRAVENOUS
  Filled 2020-03-05: qty 100

## 2020-03-05 MED ORDER — POTASSIUM CHLORIDE CRYS ER 20 MEQ PO TBCR
20.0000 meq | EXTENDED_RELEASE_TABLET | Freq: Every day | ORAL | Status: DC
  Administered 2020-03-05 – 2020-03-14 (×10): 20 meq via ORAL
  Filled 2020-03-05 (×9): qty 1

## 2020-03-05 NOTE — Plan of Care (Signed)
  Problem: Consults Goal: RH GENERAL PATIENT EDUCATION Description: See Patient Education module for education specifics. Outcome: Progressing Goal: Skin Care Protocol Initiated - if Braden Score 18 or less Description: If consults are not indicated, leave blank or document N/A Outcome: Progressing Goal: Nutrition Consult-if indicated Outcome: Progressing   Problem: RH BOWEL ELIMINATION Goal: RH STG MANAGE BOWEL WITH ASSISTANCE Description: STG Manage Bowel with mod I Assistance. Outcome: Progressing Goal: RH STG MANAGE BOWEL W/MEDICATION W/ASSISTANCE Description: STG Manage Bowel with Medication with mod I Assistance. Outcome: Progressing   Problem: RH BLADDER ELIMINATION Goal: RH STG MANAGE BLADDER WITH ASSISTANCE Description: STG Manage Bladder With min Assistance Outcome: Progressing Goal: RH STG MANAGE BLADDER WITH EQUIPMENT WITH ASSISTANCE Description: STG Manage Bladder With Equipment With min Assistance Outcome: Progressing   Problem: RH SKIN INTEGRITY Goal: RH STG SKIN FREE OF INFECTION/BREAKDOWN Description: Will have no new areas of break down Outcome: Progressing Goal: RH STG MAINTAIN SKIN INTEGRITY WITH ASSISTANCE Description: STG Maintain Skin Integrity With mod I Assistance. Outcome: Progressing Goal: RH STG ABLE TO PERFORM INCISION/WOUND CARE W/ASSISTANCE Description: STG Able To Perform Incision/Wound Care With mod I Assistance. Outcome: Progressing   Problem: RH SAFETY Goal: RH STG ADHERE TO SAFETY PRECAUTIONS W/ASSISTANCE/DEVICE Description: STG Adhere to Safety Precautions With Assistance/Device. Outcome: Progressing   Problem: RH PAIN MANAGEMENT Goal: RH STG PAIN MANAGED AT OR BELOW PT'S PAIN GOAL Description: Pain scale <2/10 Outcome: Progressing   Problem: RH KNOWLEDGE DEFICIT GENERAL Goal: RH STG INCREASE KNOWLEDGE OF SELF CARE AFTER HOSPITALIZATION Outcome: Progressing

## 2020-03-05 NOTE — Progress Notes (Signed)
Speech Language Pathology Daily Session Note  Patient Details  Name: Duane Santos MRN: [id number] Date of Birth: 11/04/1946  Today's Date: 03/05/2020 SLP Individual Time: 0916-0958 SLP Individual Time Calculation (min): 42 min  Short Term Goals: Week 2: SLP Short Term Goal 1 (Week 2): Pt will sustain attention to basic task for 15  minutes with Max A cues. SLP Short Term Goal 2 (Week 2): Pt will utilize external memory aids to recall orientation information with Mod A cues. SLP Short Term Goal 3 (Week 2): Patient will verbalize 2 physical and 2 cognitive deficits with Max A mulitmodal cues. SLP Short Term Goal 4 (Week 2): Patient will demonstrate functional problem solving for basic and familiar tasks with Max A verbal cues.  Skilled Therapeutic Interventions: Pt was seen for skilled ST targeting cognitive goals. Pt unable to answer orientation questions without verbal and visual cues to use external aids in room, in addition to Mod A for verbal initiation. He completed basic PEG board designs with only extra time and Min A verbal cues for problem solving, demonstrating some ability to self-correct errors. However, as complexity of designs and environmental distractions increased, pt required increased Mod A verbal and visual cues for error awareness and problem solving, and Min A for redirection to task. Pt left sitting in bed with alarm set and RN present. Continue per current plan of care.     Pain Pain Assessment Pain Scale: 0-10 Pain Score: 0-No pain  Therapy/Group: Individual Therapy  Arbutus Leas 03/05/2020, 7:22 AM

## 2020-03-05 NOTE — Progress Notes (Signed)
Pharmacy Antibiotic Note Duane Pandya Flemingis a73 y.o.maleadmittedon 3/18/2021with meningitis. Pharmacy has been consulted for Ambisome and flucytosine (D#18) dosing for cryptococcal meningitis.  Assessment: 41 YOM with myasthenia gravis on prednisone/mycophenolate. Noted to have positive cryptococcus Ag and high titer.Started on amphotericin - liposomal and flucytosine evening on 3/11. He had LP and BAL performed 3/12 - CSF cx with yeast. Transferred to inpatient rehab on 3/18. 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  Scr today isstable at 0.79. Would continue pre-dose 500 mL bolus and post-dose 1000 mL bolus fluids.Magnesium is WNL at 1.8 and potassium is WNL at 3.9 today after receiving 60 mEq supplementation yesterday.   Plan: Continue amphotericin B 200 mg IV daily  Continue normal saline 500 mL pre-dose and 1000 mL post-dose for renal protection  Increase potassium to a total of 60 mEQ PO daily (will split into two doses) Will supplement with magnesium 1g IV x1 today  Monitor renal function, electrolytes, clinical improvement   Height: _0  (167.6 cm) Weight: 116 lb 13.5 oz (53 kg) IBW/kg (Calculated) : 63.8  Temp (24hrs), Avg:97.8 F (36.6 C), Min:97.5 F (36.4 C), Max:97.9 F (36.6 C)  Recent Labs  Lab 02/28/20 0807 02/28/20 0807 02/29/20 0504 02/29/20 0504 03/01/20 0500 03/02/20 0242 03/03/20 0430 03/04/20 0410 03/05/20 0419  WBC 5.5  --  6.3  --  18.5* 5.0 4.5  --   --   CREATININE 0.96   < > 1.04   < > 0.88 0.91 0.93 0.72 0.79   < > = values in this interval not displayed.    Estimated Creatinine Clearance: 62.6 mL/min (by C-G formula based on SCr of 0.79 mg/dL).    No Known Allergies  Antimicrobials this admission: Ambisome 3/11>> Flucytosine 3/11>> Cefepime/Vancomycin 3/6>3/8 Zosyn 3/8 >>3/15   Thank you for allowing pharmacy to be a part of this patient's care.  Kennon Holter, PharmD PGY1 Ambulatory Care Pharmacy  Resident 03/05/2020 7:22 AM

## 2020-03-05 NOTE — Progress Notes (Signed)
Pt wife called and wanted to know why the CC was being put on husband. Explained to wife per doctors note why it is being encouraged. Wife thinks it is a good idea for night but not during day, because he needs to be walked and encouraged to go to the bathroom. Wife states that she would rather it not be on at all, however if he really needs it at night, she kind of understands why.  Wife educated by this Probation officer per doctors note. Wife states that she will call in the morning to talk to doctor .

## 2020-03-05 NOTE — Progress Notes (Signed)
[facility] PHYSICAL MEDICINE & REHABILITATION PROGRESS NOTE   Subjective/Complaints: Patient seen laying in bed this morning.  He states he slept well overnight.  Noted to be wandering the halls yesterday.  Telesitter ordered.  Patient states he would like to sleep more.  ROS: Denies CP, SOB, N/V/D,?  Reliability.  Objective:   No results found. Recent Labs    03/03/20 0430  WBC 4.5  HGB 9.3*  HCT 28.3*  PLT 225   Recent Labs    03/04/20 0410 03/05/20 0419  NA 135 136  K 3.6 3.9  CL 101 103  CO2 25 26  GLUCOSE 99 101*  BUN 17 15  CREATININE 0.72 0.79  CALCIUM 8.9 9.1    Intake/Output Summary (Last 24 hours) at 03/05/2020 1939 Last data filed at 03/05/2020 1903 Gross per 24 hour  Intake 3625.56 ml  Output 5340 ml  Net -1714.44 ml     Physical Exam: Vital Signs Blood pressure 105/67, pulse 84, temperature 97.9 F (36.6 C), resp. rate 18, height 5\' 6"  (1.676 m), weight 53 kg, SpO2 100 %.  Constitutional: No distress . Vital signs reviewed. HENT: Normocephalic.  Atraumatic. Eyes: EOMI. No discharge. Cardiovascular: No JVD. Respiratory: Normal effort.  No stridor. GI: Non-distended. Skin: Warm and dry.  Intact. Psych: Processing delay,?  Some improvement Musc: No edema in extremities.  No tenderness in extremities. Neuro: Alert and oriented x1 Motor: 4-4+/5 throughout, unchanged  Assessment/Plan: 1. 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  Physiatrist is providing close team supervision and 24 hour management of active medical problems listed below.  Physiatrist and rehab team continue to assess barriers to discharge/monitor patient progress toward functional and medical goals  Care Tool:  Bathing    Body parts bathed by patient: Right arm, Left arm, Chest, Abdomen, Right upper leg, Left  upper leg, Front perineal area, Face   Body parts bathed by helper: Left lower leg, Right lower leg, Buttocks     Bathing assist Assist Level: Moderate Assistance - Patient 50 - 74%     Upper Body Dressing/Undressing Upper body dressing   What is the patient wearing?: Hospital gown only    Upper body assist Assist Level: Maximal Assistance - Patient 25 - 49%    Lower Body Dressing/Undressing Lower body dressing      What is the patient wearing?: Incontinence brief     Lower body assist Assist for lower body dressing: Maximal Assistance - Patient 25 - 49%     Toileting Toileting    Toileting assist Assist for toileting: Maximal Assistance - Patient 25 - 49%     Transfers Chair/bed transfer  Transfers assist  Chair/bed transfer activity did not occur: Safety/medical concerns  Chair/bed transfer assist level: Minimal Assistance - Patient > 75%     Locomotion Ambulation   Ambulation assist      Assist level: Contact Guard/Touching assist Assistive device: Walker-rolling Max distance: 100 ft   Walk 10 feet activity   Assist  Walk 10 feet activity did not occur: Safety/medical concerns  Assist level: Contact Guard/Touching assist Assistive device: Walker-rolling   Walk 50 feet activity   Assist Walk 50 feet with 2 turns activity did not occur: Safety/medical concerns  Assist level: Contact Guard/Touching assist Assistive device: Walker-rolling    Walk 150 feet activity   Assist Walk 150 feet activity did not occur: Safety/medical  concerns  Assist level: Supervision/Verbal cueing Assistive device: Walker-rolling    Walk 10 feet on uneven surface  activity   Assist Walk 10 feet on uneven surfaces activity did not occur: Safety/medical concerns         Wheelchair     Assist Will patient use wheelchair at discharge?: No(per PT eval)      Wheelchair assist level: Minimal Assistance - Patient > 75% Max wheelchair distance: 100     Wheelchair 50 feet with 2 turns activity    Assist        Assist Level: Minimal Assistance - Patient > 75%   Wheelchair 150 feet activity     Assist      Assist Level: Moderate Assistance - Patient 50 - 74%   Blood pressure 105/67, pulse 84, temperature 97.9 F (36.6 C), resp. rate 18, height 5\' 6"  (1.676 m), weight 53 kg, SpO2 100 %.  Medical Problem List and Plan: 1.  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 DVT/PE/ Antithrombotics:  -DVT/anticoagulation:  converted back to Eliquis   -will need to go back to iv heparin prior to repeat LP             -antiplatelet therapy: N/a 3. Pain Management: add sports rub for right SCM tenderness  -kpad  Controlled on 3/28 4. Mood: LCSW to monitor for evaluation and support when appropriate.              -antipsychotic agents: N/A 5. Neuropsych: This patient is not capable of making decisions on his own behalf.  Telesitter for safety 6. Skin/Wound Care: routine pressure relief measures.   Condom catheter nightly due to MASD and to improve sleep 7. Fluids/Electrolytes/Nutrition: Monitor I/Os  -severe protein deficient malnutrition  -dietary supplements, RD following 8. Disseminated cryptococcal infection/meningoencephalitis: On amphotericin B daily and Flucytosine every 6 hours--  daily BMP, Mg and K+ as well as periodic LFTs.     3/22: Repeat LP 3/21: glucose 27, protein 145, wbc 104, CSF culture no growth, fungal cultures no growth to date  -repeat LP prior to discharge per ID  -plan for 28 days of iv amphotericin (through 4/8) prior to change to long term fluconazole  LFTs elevated on 3/22, labs ordered for Monday 9. Hyponatremia:   Sodium 135 on 3/27 10. Myasthenia gravis: Has been off Cellcept since Covid. Had been on prednisone long term prior to cellcept. Would not recommend  tapering all the way off prednisone at this time. Continue 5mg  daily   - WIFE wants pt's Neurologist- a Emergency planning/management officer on Myasthenia gravis called with any med changes- Dr Westley Foots- [phone]  11. Post Covid hypoxia: Has resolved--current O2 sats on room ar 99%- continue to monitor.  Breathing comfortably.  12. Hypokalemia: Due to meds  Continue supplementation  Potassium 3.6 on 3/27 13. Stage I pressure ulcer and skin tears-   routine skin care and call WOC if needed.    -maximize nutrition  -foam dressings to arms 14. Leukocytosis: Resolved    Likely steroid related  -afebrile, abx as above 15.  Macrocytic anemia  Hemoglobin 9.3 on 3/26  Labs ordered for tomorrow 16.  Urinary incontinence-likely secondary to cognition  Condom cath at night  LOS: 10 days A FACE TO FACE EVALUATION WAS PERFORMED  Duane Santos [name] 03/05/2020, 7:39 PM

## 2020-03-06 ENCOUNTER — Inpatient Hospital Stay (HOSPITAL_COMMUNITY): Payer: Medicare HMO | Admitting: Speech Pathology

## 2020-03-06 ENCOUNTER — Inpatient Hospital Stay (HOSPITAL_COMMUNITY): Payer: Medicare HMO | Admitting: Occupational Therapy

## 2020-03-06 ENCOUNTER — Inpatient Hospital Stay (HOSPITAL_COMMUNITY): Payer: Medicare HMO | Admitting: Physical Therapy

## 2020-03-06 LAB — COMPREHENSIVE METABOLIC PANEL
ALT: 151 U/L — ABNORMAL HIGH (ref 0–44)
AST: 78 U/L — ABNORMAL HIGH (ref 15–41)
Albumin: 2.5 g/dL — ABNORMAL LOW (ref 3.5–5.0)
Alkaline Phosphatase: 86 U/L (ref 38–126)
Anion gap: 7 (ref 5–15)
BUN: 15 mg/dL (ref 8–23)
CO2: 26 mmol/L (ref 22–32)
Calcium: 8.8 mg/dL — ABNORMAL LOW (ref 8.9–10.3)
Chloride: 103 mmol/L (ref 98–111)
Creatinine, Ser: 0.89 mg/dL (ref 0.61–1.24)
GFR calc Af Amer: >60 mL/min (ref >60)
GFR calc non Af Amer: >60 mL/min (ref >60)
Glucose, Bld: 98 mg/dL (ref 70–99)
Potassium: 3.8 mmol/L (ref 3.5–5.1)
Sodium: 136 mmol/L (ref 135–145)
Total Bilirubin: 0.7 mg/dL (ref 0.3–1.2)
Total Protein: 4.8 g/dL — ABNORMAL LOW (ref 6.5–8.1)

## 2020-03-06 LAB — MAGNESIUM: Magnesium: 1.9 mg/dL (ref 1.7–2.4)

## 2020-03-06 LAB — CBC
HCT: 29.1 % — ABNORMAL LOW (ref 39.0–52.0)
Hemoglobin: 9.7 g/dL — ABNORMAL LOW (ref 13.0–17.0)
MCH: 33.2 pg (ref 26.0–34.0)
MCHC: 33.3 g/dL (ref 30.0–36.0)
MCV: 99.7 fL (ref 80.0–100.0)
Platelets: 215 10*3/uL (ref 150–400)
RBC: 2.92 MIL/uL — ABNORMAL LOW (ref 4.22–5.81)
RDW: 15.9 % — ABNORMAL HIGH (ref 11.5–15.5)
WBC: 5 10*3/uL (ref 4.0–10.5)
nRBC: 0 % (ref 0.0–0.2)

## 2020-03-06 MED ORDER — MAGNESIUM SULFATE 2 GM/50ML IV SOLN
2.0000 g | Freq: Once | INTRAVENOUS | Status: AC
  Administered 2020-03-06: 2 g via INTRAVENOUS
  Filled 2020-03-06: qty 50

## 2020-03-06 NOTE — Progress Notes (Signed)
Patient ID: Duane Santos, male   DOB: 09-23-1946, 74 y.o.   MRN: [id number]         [facility]  Date of Admission:  02/24/2020           Day 19 amphotericin and flucytosine ASSESSMENT: He seems to be improving slowly on induction therapy for cryptococcal meningoencephalitis.  He needs a minimum of 28 days of induction therapy through 03/15/2020.  He has been tolerating antifungal therapy well.  PLAN: 1. Continue amphotericin and flucytosine 2. I will follow-up next week  Principal Problem:   Cryptococcal meningitis ([place]) Active Problems:   Myasthenia gravis ([place])   Right lower lobe lung mass   History of COVID-19 pneumonia with hypoxia   Encephalitis   Stage I pressure ulcer of sacral region   Protein-calorie malnutrition, severe   Hypokalemia   Macrocytic anemia   Transaminitis   Nocturnal enuresis   Scheduled Meds: . apixaban  5 mg Oral BID  . butamben-tetracaine-benzocaine  1 spray Topical Once  . Chlorhexidine Gluconate Cloth  6 each Topical BID  . dextrose  10 mL Intravenous Q24H  . dextrose  10 mL Intravenous Q24H  . feeding supplement (ENSURE ENLIVE)  237 mL Oral QID  . feeding supplement (PRO-STAT SUGAR FREE 64)  30 mL Oral BID  . flucytosine  25 mg/kg Oral Q6H  . multivitamin with minerals  1 tablet Oral Daily  . potassium chloride  20 mEq Oral Daily  . potassium chloride  40 mEq Oral Daily  . predniSONE  5 mg Oral Q breakfast  . sodium chloride  1,000 mL Intravenous Q24H  . sodium chloride  500 mL Intravenous Q24H  . sodium chloride flush  10-40 mL Intracatheter Q12H  . sodium chloride  1 g Oral TID WC  . thiamine  100 mg Oral Daily   Continuous Infusions: . sodium chloride Stopped (03/05/20 1501)  . amphotericin  B  Liposome (AMBISOME) ADULT IV Stopped (03/06/20 0200)   PRN Meds:.sodium chloride, acetaminophen, alum & mag hydroxide-simeth, bisacodyl, diphenhydrAMINE, diphenhydrAMINE **OR** diphenhydrAMINE,  guaiFENesin-dextromethorphan, meperidine (DEMEROL) injection, metoprolol tartrate, Muscle Rub, ondansetron **OR** ondansetron (ZOFRAN) IV, phenylephrine, polyethylene glycol, prochlorperazine **OR** prochlorperazine **OR** prochlorperazine, senna-docusate, sodium chloride flush, sodium phosphate, traZODone   SUBJECTIVE: He says that his headaches are intermittent and improving.  As he is being treated for infection but cannot tell me what type  Review of Systems: Review of Systems  Unable to perform ROS: Mental acuity    No Known Allergies  OBJECTIVE: Vitals:   03/05/20 0525 03/05/20 1426 03/05/20 2057 03/06/20 0309  BP: 132/76 105/67 126/74 135/88  Pulse: 81 84 86 94  Resp: 16 18 16 18   Temp: (!) 97.5 F (36.4 C) 97.9 F (36.6 C) 98.1 F (36.7 C) 97.9 F (36.6 C)  TempSrc: Oral  Oral Oral  SpO2: 99% 100% 100% 98%  Weight:      Height:       Body mass index is 18.86 kg/m.  Physical Exam Constitutional:      Comments: He is sitting up in bed feeding himself breakfast.  He seems to be a little bit quicker to answer questions.     Lab Results Lab Results  Component Value Date   WBC 5.0 03/06/2020   HGB 9.7 (L) 03/06/2020   HCT 29.1 (L) 03/06/2020   MCV 99.7 03/06/2020   PLT 215 03/06/2020    Lab Results  Component Value Date   CREATININE 0.89 03/06/2020   BUN  15 03/06/2020   NA 136 03/06/2020   K 3.8 03/06/2020   CL 103 03/06/2020   CO2 26 03/06/2020    Lab Results  Component Value Date   ALT 151 (H) 03/06/2020   AST 78 (H) 03/06/2020   ALKPHOS 86 03/06/2020   BILITOT 0.7 03/06/2020     Microbiology: Recent Results (from the past 240 hour(s))  CSF culture     Status: None   Collection Time: 02/27/20  1:30 PM   Specimen: PATH Cytology CSF; Cerebrospinal Fluid  Result Value Ref Range Status   Specimen Description CSF  Final   Special Requests NONE  Final   Gram Stain   Final    WBC PRESENT, PREDOMINANTLY MONONUCLEAR NO ORGANISMS SEEN CYTOSPIN  SMEAR    Culture   Final    NO GROWTH Performed at [facility], [address] [place], [place] [address]    Report Status 03/01/2020 FINAL  Final  Culture, fungus without smear     Status: None (Preliminary result)   Collection Time: 02/27/20  1:30 PM   Specimen: PATH Cytology CSF; Cerebrospinal Fluid  Result Value Ref Range Status   Specimen Description CSF  Final   Special Requests NONE  Final   Culture   Final    NO FUNGUS ISOLATED AFTER 8 DAYS Performed at [facility], [address] [place], [place] [address]    Report Status PENDING  Incomplete    Michel Bickers, MD [facility] for [facility]ious [place] [phone] pager   [phone] cell 03/06/2020, 10:39 AM

## 2020-03-06 NOTE — Progress Notes (Signed)
[facility] PHYSICAL MEDICINE & REHABILITATION PROGRESS NOTE   Subjective/Complaints: Lying in bed. No specific complaints. Denies neck pain today  ROS: Limited due to cognitive/behavioral     Objective:   No results found. Recent Labs    03/06/20 0445  WBC 5.0  HGB 9.7*  HCT 29.1*  PLT 215   Recent Labs    03/05/20 0419 03/06/20 0445  NA 136 136  K 3.9 3.8  CL 103 103  CO2 26 26  GLUCOSE 101* 98  BUN 15 15  CREATININE 0.79 0.89  CALCIUM 9.1 8.8*    Intake/Output Summary (Last 24 hours) at 03/06/2020 1153 Last data filed at 03/06/2020 1023 Gross per 24 hour  Intake 2487.58 ml  Output 4550 ml  Net -2062.42 ml     Physical Exam: Vital Signs Blood pressure 135/88, pulse 94, temperature 97.9 F (36.6 C), temperature source Oral, resp. rate 18, height 5\' 6"  (1.676 m), weight 53 kg, SpO2 98 %.  Constitutional: No distress . Vital signs reviewed. HEENT: EOMI, oral membranes moist Neck: supple Cardiovascular: RRR without murmur. No JVD    Respiratory/Chest: CTA Bilaterally without wheezes or rales. Normal effort    GI/Abdomen: BS +, non-tender, non-distended Ext: no clubbing, cyanosis, or edema Psych: pleasant and cooperative Skin: Warm and dry.  Intact. Psych: alert but still with limited insight and awareness. Oriented to person. Thought he was in [employer].  Musc: No edema in extremities.  No tenderness in extremities. Neuro: sensory exam intact.  Motor: 4-4+/5 throughout, unchanged  Assessment/Plan: 1. 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  Physiatrist is providing close team supervision and 24 hour management of active medical problems listed below.  Physiatrist and rehab team continue to assess barriers to discharge/monitor patient progress toward functional and medical  goals  Care Tool:  Bathing    Body parts bathed by patient: Right arm, Left arm, Chest, Abdomen, Right upper leg, Left upper leg, Front perineal area, Face   Body parts bathed by helper: Left lower leg, Right lower leg, Buttocks     Bathing assist Assist Level: Moderate Assistance - Patient 50 - 74%     Upper Body Dressing/Undressing Upper body dressing   What is the patient wearing?: Hospital gown only    Upper body assist Assist Level: Maximal Assistance - Patient 25 - 49%    Lower Body Dressing/Undressing Lower body dressing      What is the patient wearing?: Incontinence brief     Lower body assist Assist for lower body dressing: Maximal Assistance - Patient 25 - 49%     Toileting Toileting    Toileting assist Assist for toileting: Maximal Assistance - Patient 25 - 49%     Transfers Chair/bed transfer  Transfers assist  Chair/bed transfer activity did not occur: Safety/medical concerns  Chair/bed transfer assist level: Minimal Assistance - Patient > 75%     Locomotion Ambulation   Ambulation assist      Assist level: Contact Guard/Touching assist Assistive device: Walker-rolling Max distance: 100 ft   Walk 10 feet activity   Assist  Walk 10 feet activity did not occur: Safety/medical concerns  Assist level: Contact Guard/Touching assist Assistive device: Walker-rolling   Walk 50 feet activity   Assist Walk 50 feet with 2 turns activity did not occur: Safety/medical concerns  Assist level: Contact Guard/Touching assist Assistive device: Walker-rolling  Walk 150 feet activity   Assist Walk 150 feet activity did not occur: Safety/medical concerns  Assist level: Supervision/Verbal cueing Assistive device: Walker-rolling    Walk 10 feet on uneven surface  activity   Assist Walk 10 feet on uneven surfaces activity did not occur: Safety/medical concerns         Wheelchair     Assist Will patient use wheelchair at  discharge?: No(per PT eval)      Wheelchair assist level: Minimal Assistance - Patient > 75% Max wheelchair distance: 100    Wheelchair 50 feet with 2 turns activity    Assist        Assist Level: Minimal Assistance - Patient > 75%   Wheelchair 150 feet activity     Assist      Assist Level: Moderate Assistance - Patient 50 - 74%   Blood pressure 135/88, pulse 94, temperature 97.9 F (36.6 C), temperature source Oral, resp. rate 18, height 5\' 6"  (1.676 m), weight 53 kg, SpO2 98 %.  Medical Problem List and Plan: 1.  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 DVT/PE/ Antithrombotics:  -DVT/anticoagulation:  converted back to Eliquis   -will need to go back to iv heparin prior to repeat LP             -antiplatelet therapy: N/a 3. Pain Management: add sports rub for right SCM tenderness  -kpad  Controlled on 3/29 4. Mood: LCSW to monitor for evaluation and support when appropriate.              -antipsychotic agents: N/A 5. Neuropsych: This patient is not capable of making decisions on his own behalf.  Telesitter for safety 6. Skin/Wound Care: routine pressure relief measures.   Condom catheter nightly due to MASD and to help with sleep   7. Fluids/Electrolytes/Nutrition: Monitor I/Os  -severe protein deficient malnutrition  -dietary supplements, RD following 8. Disseminated cryptococcal infection/meningoencephalitis: On amphotericin B daily and Flucytosine every 6 hours--  daily BMP, Mg and K+ as well as periodic LFTs.     3/22: Repeat LP 3/21: glucose 27, protein 145, wbc 104, CSF culture no growth, fungal cultures no growth to date  -repeat LP prior to discharge per ID  -plan for 28 days of iv amphotericin (through 4/8) prior to change to long term  fluconazole  -I personally reviewed the patient's labs today.  Pharmacy following labs closely, supplementing K+, Mg++  9. Hyponatremia:   Sodium 136 on 3/29 10. Myasthenia gravis: Has been off Cellcept since Covid. Had been on prednisone long term prior to cellcept. Would not recommend tapering all the way off prednisone at this time. Continue 5mg  daily   - WIFE wants pt's Neurologist- a Emergency planning/management officer on Myasthenia gravis called with any med changes- Dr Westley Foots- [phone]  11. Post Covid hypoxia: Has resolved--current O2 sats on room ar 99%- continue to monitor.  Breathing comfortably.  12. Hypokalemia: Due to meds  Continue supplementation  Potassium 3.8 on 3/29 13. Stage I pressure ulcer and skin tears-   routine skin care and call WOC if needed.    -maximize nutrition  -foam dressings to arms 14. Leukocytosis: Resolved    Likely steroid related  -afebrile, abx as above 15.  Macrocytic anemia  Hemoglobin 9.3 on 3/26  Labs pending 16.  Urinary incontinence-likely secondary to  cognition and amount of IVF  Condom cath at night   Spoke with wife regarding patient's progress today   LOS: 11 days A FACE TO FACE EVALUATION WAS PERFORMED  [name] 03/06/2020, 11:53 AM
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
## 2020-03-06 NOTE — Progress Notes (Signed)
Speech Language Pathology Daily Session Note  Patient Details  Name: Duane Santos MRN: [id number] Date of Birth: 1946/04/08  Today's Date: 03/06/2020 SLP Individual Time: 0900-0955 SLP Individual Time Calculation (min): 55 min  Short Term Goals: Week 2: SLP Short Term Goal 1 (Week 2): Pt will sustain attention to basic task for 15  minutes with Max A cues. SLP Short Term Goal 2 (Week 2): Pt will utilize external memory aids to recall orientation information with Mod A cues. SLP Short Term Goal 3 (Week 2): Patient will verbalize 2 physical and 2 cognitive deficits with Max A mulitmodal cues. SLP Short Term Goal 4 (Week 2): Patient will demonstrate functional problem solving for basic and familiar tasks with Max A verbal cues.  Skilled Therapeutic Interventions: Skilled treatment session focused on cognitive goals. SLP facilitated session by providing Min A verbal cues for selective attention to self-feeding in a minimally distracting environment. SLP also facilitated session by providing Max A verbal cues for recall of his current medications and their functions. Patient required Max A verbal cues for organizing a QD pill box due to mild confusion. Task will be completed during next session due to time constraints. Patient left upright in bed with alarm on and all needs within reach. Continue with current plan of care.      Pain No/Denies Pain   Therapy/Group: Individual Therapy  Marvelene Stoneberg 03/06/2020, 12:51 PM

## 2020-03-06 NOTE — Progress Notes (Signed)
Physical Therapy Session Note  Patient Details  Name: Duane Santos MRN: [id number] Date of Birth: 14-Mar-1946  Today's Date: 03/06/2020 PT Individual Time: 1052-1202 PT Individual Time Calculation (min): 70 min   Short Term Goals: Week 2:  PT Short Term Goal 1 (Week 2): Pt will complete car transfer with LRAD & min assist. PT Short Term Goal 2 (Week 2): Pt will negotiate 4 steps with B rails & CGA. PT Short Term Goal 3 (Week 2): Pt will consistently complete bed<>w/c transfers with LRAD & CGA.  Skilled Therapeutic Interventions/Progress Updates:  Pt received asleep in bed but awakened & agreeable to tx. Supine>sit with supervision with hospital bed features & extra time & verbal cuing to initiate. Pt demonstrates impaired sitting balance & awareness, requiring max cuing & min assist to correct 2/2 pt demonstrating decreased ability to anterior weight shift with trunk. Due to impaired balance, therapist assists pt with threading pants on BLE & pt pulls them over hips while standing with min assist. Pt dons shirt with supervision. Sit<>stands throughout session with MAX cuing for hand placement on stable surface, stand pivot with min assist & RW with max cuing to square up to surface before sitting. Gait x 200 ft with RW & CGA with cuing for increased foot clearance as pt with a couple episodes of shuffling gait, & cuing for increased step length LLE. After gait SpO2 = 100%, HR = 99 bpm. Nu-step on level 3 x 10 minutes with all four extremities with task focusing on global strengthening & coordination of reciprocal movements, as well as endurance training. Gait training with pt weaving & side stepping between cones with RW & min assist with max cuing but inability to correct shuffled gait, cuing to ambulate within base of AD, and max instructional cuing for side stepping between cones. Pt reports need to use restroom so returned to room & pt ambulates in room/bathroom with RW & CGA with cuing for  increased step length BLE as pt with shuffled gait. Pt manages clothing with CGA for balance & requires cuing to sit on toilet for increased safety. Pt is able to maintain sitting balance on toilet with distant supervision & requires significantly extra time to void & have BM (pt already with incontinent void in brief). Therapist provides assistance for peri hygiene for time management & thoroughness purposes. Pt performs hand hygiene standing at sink with max cuing to square up to sink & close supervision for standing balance. At end of session pt left sitting in w/c with chair alarm donned & all needs in reach, reviewed use of call bell with pt.   Therapy Documentation Precautions:  Precautions Precautions: Fall Restrictions Weight Bearing Restrictions: No  Pain: Pt denies c/o pain  Therapy/Group: Individual Therapy  [name] 03/06/2020, 12:12 PM

## 2020-03-06 NOTE — Progress Notes (Signed)
Pharmacy Antibiotic Note Duane Mannis Flemingis a73 y.o.maleadmittedon 3/18/2021with meningitis. Pharmacy has been consulted for Ambisome and flucytosine (D#19) dosing for cryptococcal meningitis.  Assessment: 33 YOM with myasthenia gravis on prednisone/mycophenolate. Notedto have positive cryptococcus Ag and high titer.Started on amphotericin - liposomal and flucytosine evening on 3/11. He had LP and BAL performed 3/12 - CSF cx with yeast. Transferred to inpatient rehab on 3/18. 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  Scr today is ok at 0.89 but trending up. Would continue pre-dose 500 mL bolus and post-dose 1000 mL bolus fluids.Magnesium isonly slightly improved at 1.9 after 1g IV x1 yesterday. Potassiumis WNL at3.8 today on 60 mEq supplementation daily.  Plan: Continue amphotericin B 200 mg IV daily  Continue normal saline 500 mL pre-dose and 1000 mL post-dose for renal protection  Continue potassium 60 mEQ PO daily (split over two doses) Will supplement with magnesium 2g IV x1 today  Continue to trend Scr and if elevated >1, consider increasing pre-dose NS bolus to 750 mL.  Monitor renal function, electrolytes, clinical improvement   Height: _0  (167.6 cm) Weight: 116 lb 13.5 oz (53 kg) IBW/kg (Calculated) : 63.8  Temp (24hrs), Avg:98 F (36.7 C), Min:97.9 F (36.6 C), Max:98.1 F (36.7 C)  Recent Labs  Lab 02/29/20 0504 02/29/20 0504 03/01/20 0500 03/01/20 0500 03/02/20 0242 03/03/20 0430 03/04/20 0410 03/05/20 0419 03/06/20 0445  WBC 6.3  --  18.5*  --  5.0 4.5  --   --  5.0  CREATININE 1.04   < > 0.88   < > 0.91 0.93 0.72 0.79 0.89   < > = values in this interval not displayed.    Estimated Creatinine Clearance: 56.2 mL/min (by C-G formula based on SCr of 0.89 mg/dL).    No Known Allergies  Antimicrobials this admission: Ambisome 3/11>> Flucytosine 3/11>> Cefepime/Vancomycin 3/6>3/8 Zosyn 3/8 >>3/15  Thank you for allowing pharmacy to  be a part of this patient's care.  Claudina Lick, PharmD Candidate  03/06/2020 11:13 AM

## 2020-03-06 NOTE — Progress Notes (Signed)
Occupational Therapy Session Note  Patient Details  Name: Duane Santos MRN: [id number] Date of Birth: 1946/11/05  Today's Date: 03/06/2020 OT Individual Time: 1405-1505 OT Individual Time Calculation (min): 60 min   Short Term Goals: Week 2:  OT Short Term Goal 1 (Week 2): Pt will maintain stanidng balance within BADL tasks with min A OT Short Term Goal 2 (Week 2): Pt will terminate task at appropriate time with min questioning cues. OT Short Term Goal 3 (Week 2): Pt will tolerate standing for 5 minutes within grooming task  Skilled Therapeutic Interventions/Progress Updates:    Pt greeted seated in wc with spouse present. Pt reported need to go to the bathroom. Pt had already been incontinent on brief with brief saturated with urine. Pt ambulated into bathroom w/ RW and min A with verbal cues for initiation. Pt more agitated with cues today. Pt needed min A and max cues for RW positioning over toilet to void in standing. Pt needed increased time to terminate task and became frustrated with cues for task termination. OT noted smear of BM in brief and had pt sit on commode to try to have a BM. Pt needed mod A and max cues to take steps backwards and turn around safely on commode. Pt with successful BM, but perseverative on wiping self many times. Pt again frustrated with OT cues for task termination. Pt's spouse had questions regarding transporting pt to and from bathroom. OT had pt's spouse kim assist with ambulating out of bathroom, but given pt is still on IV, OT felt it was not safe for pt's spouse to manage pt and IV. OT discussed just doing BSC transfers with pt or standing with urinal which she would be safe to do. Pt able to don clean pants and socks from wc with set-up A and CGA for balance. Pt then ambulated down to therapy day room with RW and CGA. SPO2 checked at 100% and HR 106. Worked on card matching task in standing with pt doing well initially, then perseverative on the velcro  covering up part of the suit and pt not being able to abstract that it was the same card, just covered up partly by piece of velcro. PT returned to room and wanted to return to bed in similar fashion as above. Pt left semi-reclined in bed with bed alarm on, call bell in reach, and spouse present.   Therapy Documentation  Precautions:  Precautions Precautions: Fall Restrictions Weight Bearing Restrictions: No Pain:  denies pain  Therapy/Group: Individual Therapy  Valma Cava 03/06/2020, 3:10 PM

## 2020-03-07 ENCOUNTER — Inpatient Hospital Stay (HOSPITAL_COMMUNITY): Payer: Medicare HMO | Admitting: Physical Therapy

## 2020-03-07 ENCOUNTER — Inpatient Hospital Stay (HOSPITAL_COMMUNITY): Payer: Medicare HMO | Admitting: Occupational Therapy

## 2020-03-07 ENCOUNTER — Inpatient Hospital Stay (HOSPITAL_COMMUNITY): Payer: Medicare HMO | Admitting: Speech Pathology

## 2020-03-07 LAB — BASIC METABOLIC PANEL
Anion gap: 7 (ref 5–15)
BUN: 17 mg/dL (ref 8–23)
CO2: 25 mmol/L (ref 22–32)
Calcium: 9 mg/dL (ref 8.9–10.3)
Chloride: 103 mmol/L (ref 98–111)
Creatinine, Ser: 0.78 mg/dL (ref 0.61–1.24)
GFR calc Af Amer: >60 mL/min (ref >60)
GFR calc non Af Amer: >60 mL/min (ref >60)
Glucose, Bld: 102 mg/dL — ABNORMAL HIGH (ref 70–99)
Potassium: 3.9 mmol/L (ref 3.5–5.1)
Sodium: 135 mmol/L (ref 135–145)

## 2020-03-07 LAB — MAGNESIUM: Magnesium: 2 mg/dL (ref 1.7–2.4)

## 2020-03-07 NOTE — Progress Notes (Signed)
Occupational Therapy Session Note  Patient Details  Name: Duane Santos MRN: [id number] Date of Birth: 07/22/1946  Today's Date: 03/07/2020 OT Individual Time: 1103-1200 OT Individual Time Calculation (min): 57 min    Short Term Goals: Week 1:  OT Short Term Goal 1 (Week 1): Pt will complete toilet transfer with min A OT Short Term Goal 1 - Progress (Week 1): Met OT Short Term Goal 2 (Week 1): Pt will complete LB dressing with mod A OT Short Term Goal 2 - Progress (Week 1): Met OT Short Term Goal 3 (Week 1): Pt will self-feed 50% of meal with min A. OT Short Term Goal 3 - Progress (Week 1): Met  Skilled Therapeutic Interventions/Progress Updates:    Pt greeted semi-reclined in bed with eyes closed, pt was easy to wake, but very slow to initiate bed mobility requiring multiple cues and much more than reasonable amount of time. Pt with posterior LOB sitting EOB requiring min A and verbal cues to correct. CGA sit<>stand, then CGA/min A to ambulate into bathroom w/ RW. Pt stood to void with verbal and tactile cues for RW positioning over the commode. Pt with successful continent void in commode. Pt stood at commode longer than needed and required verbal cues for task termination and to begin walking back out of room. Min A and verbal cues to move his feet to back up and turn out of bathroom. LB bathing in standing with CGA and facilitation for anterior weight shift. Pt given choices for which shirt he wanted to wear and needed increased time and verbal cues to state that choice. Had pt complete toothbrushing task in standing today to see how he did with increased task demand. After 5 mins of brushing his teeth, OT started cuing pt to move on to rinsing his teeth. Pt became more agitated with OT's cues and it took him 5 more mins to stop rinsing. Pt donned shirt with set-up A and left seated in wc with alarm belt on, call bell in reach and needs met.   Therapy Documentation Precautions:   Precautions Precautions: Fall Restrictions Weight Bearing Restrictions: No Pain: Pain Assessment Pain Scale: 0-10 Pain Score: 0-No pain   Therapy/Group: Individual Therapy  Valma Cava 03/07/2020, 11:33 AM

## 2020-03-07 NOTE — Progress Notes (Signed)
Physical Therapy Session Note  Patient Details  Name: Duane Santos MRN: [id number] Date of Birth: [birth year]  Today's Date: 03/07/2020 PT Individual Time: 3343-5686 PT Individual Time Calculation (min): 70 min   Short Term Goals: Week 2:  PT Short Term Goal 1 (Week 2): Pt will complete car transfer with LRAD & min assist. PT Short Term Goal 2 (Week 2): Pt will negotiate 4 steps with B rails & CGA. PT Short Term Goal 3 (Week 2): Pt will consistently complete bed<>w/c transfers with LRAD & CGA.  Skilled Therapeutic Interventions/Progress Updates:  Pt received in w/c, finishing last few bites of lunch & agreeable to tx. Transported pt to gym via w/c dependent assist for time management. Pt completes sit<>stand with CGA & ongoing cuing for hand placement on stable surface & to square up to surface before sitting. Pt ambulates around unit with RW & CGA with much improved gait pattern & balance today. Pt completes car transfer with RW & CGA with cuing to sit then place BLE in/out of car vs stepping in. Pt performs standing heel raises & toe raises with BUE support on RW, 2 sets x 15 reps of each. Pt negotiates 8 steps with B rails & min assist with decreased awareness of need to place entire foot on step. Pt returned to room & ambulates in room/bathroom with RW & CGA. Pt found to be incontinent of bowels & pt unaware. Pt completes toilet transfer with CGA. Pt requires extra time on toilet but has continent BM & void & performs peri hygiene. Pt performs hand hygiene standing at sink with close supervision<>CGA with pt requiring seated rest break after toileting. Pt utilized kinetron in sitting then standing with BUE support and task focusing on BLE strengthening, then dynamic balance, weight shifting L<>R, with therapist providing cuing for upright posture. At end of session pt left in w/c with chair alarm donned & all needs in reach, telesitter in room.  Therapy Documentation Precautions:   Precautions Precautions: Fall Restrictions Weight Bearing Restrictions: No   Pain: Pt denies c/o pain at beginning of session.   Therapy/Group: Individual Therapy  [name] 03/07/2020, 2:17 PM

## 2020-03-07 NOTE — Progress Notes (Signed)
Pharmacy Antibiotic Note  Duane Shiffler Flemingis a73 y.o.maleadmittedon 3/18/2021with meningitis. Pharmacy has been consulted for Ambisome and flucytosine (D#20) dosing for cryptococcal meningitis.  Assessment: 32 YOM with myasthenia gravis on prednisone/mycophenolate. Notedto have positive cryptococcus Ag and high titer.Started on amphotericin - liposomal and flucytosine evening on 3/11. He had LP and BAL performed 3/12 - CSF cx with yeast. Transferred to inpatient rehab on 3/18. 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  Scr today is improved at 0.78. Would continue pre-dose 500 mL bolus and post-dose 1000 mL bolus fluids.Magnesium is WNL at 2 after 2g IV x1 yesterday. PotassiumisWNLat3.9today on 60 mEq supplementationdaily.  Plan: Continue amphotericin B 200 mg IV daily  Continue normal saline 500 mL pre-dose and 1000 mL post-dose for renal protection Continuepotassium 60 mEQ PO daily(split over two doses)  Continue to trend Scr and if elevated >1, consider increasing pre-dose NS bolus to 750 mL.  Monitor renal function, electrolytes, clinical improvement  Height: _0  (167.6 cm) Weight: 120 lb 2.4 oz (54.5 kg) IBW/kg (Calculated) : 63.8  Temp (24hrs), Avg:97.9 F (36.6 C), Min:97.8 F (36.6 C), Max:98.2 F (36.8 C)  Recent Labs  Lab 03/01/20 0500 03/01/20 0500 03/02/20 0242 03/02/20 0242 03/03/20 0430 03/04/20 0410 03/05/20 0419 03/06/20 0445 03/07/20 0420  WBC 18.5*  --  5.0  --  4.5  --   --  5.0  --   CREATININE 0.88   < > 0.91   < > 0.93 0.72 0.79 0.89 0.78   < > = values in this interval not displayed.    Estimated Creatinine Clearance: 64.3 mL/min (by C-G formula based on SCr of 0.78 mg/dL).    No Known Allergies  Antimicrobials this admission: Ambisome 3/11>> Flucytosine3/11>> Cefepime/Vancomycin 3/6>3/8 Zosyn 3/8 >>3/15  Thank you for allowing pharmacy to be a part of this patient's care.  Claudina Lick, PharmD Candidate   03/07/2020 6:16 AM

## 2020-03-07 NOTE — Plan of Care (Signed)
  Problem: Consults Goal: RH GENERAL PATIENT EDUCATION Description: See Patient Education module for education specifics. Outcome: Progressing Goal: Skin Care Protocol Initiated - if Braden Score 18 or less Description: If consults are not indicated, leave blank or document N/A Outcome: Progressing Goal: Nutrition Consult-if indicated Outcome: Progressing   Problem: RH BOWEL ELIMINATION Goal: RH STG MANAGE BOWEL WITH ASSISTANCE Description: STG Manage Bowel with mod I Assistance. Outcome: Progressing Goal: RH STG MANAGE BOWEL W/MEDICATION W/ASSISTANCE Description: STG Manage Bowel with Medication with mod I Assistance. Outcome: Progressing   Problem: RH BLADDER ELIMINATION Goal: RH STG MANAGE BLADDER WITH ASSISTANCE Description: STG Manage Bladder With min Assistance Outcome: Progressing Goal: RH STG MANAGE BLADDER WITH EQUIPMENT WITH ASSISTANCE Description: STG Manage Bladder With Equipment With min Assistance Outcome: Progressing   Problem: RH SKIN INTEGRITY Goal: RH STG SKIN FREE OF INFECTION/BREAKDOWN Description: Will have no new areas of break down Outcome: Progressing Goal: RH STG MAINTAIN SKIN INTEGRITY WITH ASSISTANCE Description: STG Maintain Skin Integrity With mod I Assistance. Outcome: Progressing Goal: RH STG ABLE TO PERFORM INCISION/WOUND CARE W/ASSISTANCE Description: STG Able To Perform Incision/Wound Care With mod I Assistance. Outcome: Progressing   Problem: RH SAFETY Goal: RH STG ADHERE TO SAFETY PRECAUTIONS W/ASSISTANCE/DEVICE Description: STG Adhere to Safety Precautions With Assistance/Device. Outcome: Progressing   Problem: RH PAIN MANAGEMENT Goal: RH STG PAIN MANAGED AT OR BELOW PT'S PAIN GOAL Description: Pain scale <2/10 Outcome: Progressing   Problem: RH KNOWLEDGE DEFICIT GENERAL Goal: RH STG INCREASE KNOWLEDGE OF SELF CARE AFTER HOSPITALIZATION Outcome: Progressing

## 2020-03-07 NOTE — Progress Notes (Signed)
Speech Language Pathology Daily Session Note  Patient Details  Name: Duane Santos MRN: [id number] Date of Birth: 04-04-1946  Today's Date: 03/07/2020 SLP Individual Time: 0830-0925 SLP Individual Time Calculation (min): 55 min  Short Term Goals: Week 2: SLP Short Term Goal 1 (Week 2): Pt will sustain attention to basic task for 15  minutes with Max A cues. SLP Short Term Goal 2 (Week 2): Pt will utilize external memory aids to recall orientation information with Mod A cues. SLP Short Term Goal 3 (Week 2): Patient will verbalize 2 physical and 2 cognitive deficits with Max A mulitmodal cues. SLP Short Term Goal 4 (Week 2): Patient will demonstrate functional problem solving for basic and familiar tasks with Max A verbal cues.  Skilled Therapeutic Interventions: Skilled treatment session focused on cognitive goals. Upon arrival, patient was more awake, alert and interactive with SLP. SLP facilitated session by continuing task from yesterday's session. Patient recalled the task but was unable to recall where the SLP left his medication list the previous day. Patient initially required Mod A for problem solving with task but became increasingly confused as the task progressed requiring Max A multimodal cues. Patient receptive to cues from SLP but continued to report confusion, therefore, task was discontinued. Patient left upright in bed with alarm on and all needs within reach. Continue with current plan of care.      Pain Pain Assessment Pain Scale: 0-10 Pain Score: 0-No pain  Therapy/Group: Individual Therapy  Duane Santos 03/07/2020, 9:57 AM

## 2020-03-07 NOTE — Progress Notes (Signed)
Social Work Patient ID: Duane Santos, male   DOB: [birth year], 74 y.o.   MRN: [id number]   SW met with pt wife Maudie Mercury to provide updates from team conference, and informed on d/c date remaining the same. She asked questions related to supplies for condom cath and how to order if pt needed at home. She also asked about aide and attendant forms through the [place]. SW explained process in which pt would need to be enrolled into [place], and would be eligible based on his service connection. SW provided contact information for [place], [place]. Pt wife continues to work on additional supports for pt care at d/c to avoid caregiver burnout.   SW will continue to follow and assess pt for discharge needs.   Loralee Pacas, MSW, [place] Office: [phone] Cell: [phone] Fax: [phone]

## 2020-03-07 NOTE — Patient Care Conference (Signed)
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 Hypokalemia   . Macrocytic anemia   . Transaminitis   . Nocturnal enuresis   . Stage I pressure ulcer of sacral region 02/25/2020  . Protein-calorie malnutrition, severe 02/25/2020  . Encephalitis 02/24/2020  . 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 Severe malnutrition ([employer]) 02/24/2020  . Acute confusional state 02/11/2020  . Palliative care encounter   . Generalized weakness 02/09/2020  . Hyponatremia 02/09/2020  . History of COVID-19 pneumonia with hypoxia 02/09/2020  . Acute metabolic encephalopathy 32/67/1245  . Chronic respiratory failure with hypoxia ([place]) 02/09/2020  . Right lower lobe lung mass 01/10/2020  . Acute pulmonary embolism without acute cor pulmonale ([place]) 01/10/2020  . Pneumonia due to COVID-19 virus 12/21/2019  . Acute respiratory failure with hypoxia ([place]) 12/21/2019  . Myasthenia gravis ([place]) 12/21/2019  . Normocytic anemia 12/21/2019  . Osteoporosis 12/21/2019  . Multifocal pneumonia 12/20/2019    Expected Discharge Date: Expected Discharge Date: 03/15/20  Team Members Present: Physician leading conference: Dr. Alger Simons Care Coodinator Present: Loralee Pacas, LCSWA;Genie Paschal Blanton, RN, MSN Nurse Present: Suella Grove, RN PT Present: Lavone Nian, PT OT Present: Cherylynn Ridges, OT SLP Present: Weston Anna, SLP PPS Coordinator present : Gunnar Fusi, SLP     Current Status/Progress Goal Weekly Team Focus  Bowel/Bladder   pt is incont/cont more @  nights, wears a CC at night . LMB- 03/05/2020  time toliet and help pt to call when needs to be tolieted  assess B&B qhisft and prn   Swallow/Nutrition/ Hydration             ADL's   Min A overall, limited by cognitition and awareness  Supervision  cognitive retraining, balance, activity tolerance, coordination, pt/family education, incontinence   Mobility   CGA gait with RW, min assist stairs with B rails, impaired cognition/awareness, impaired balance  supervision overall with LRAD except CGA stairs with B rails  gait, balance, cognition, transfers, bed mobility, stairs, endurance, strengthening   Communication             Safety/Cognition/ Behavioral Observations  Max A  Mod A  orientation, attention, problem solving, recall   Pain   pt has no c/o of pain  remain pain free  assses pain qshift and prn   Skin   MASD on grion  remain free of any new breakdown  assess skin qshift and prn    Rehab Goals Patient on target to meet rehab goals: Yes *See Care Plan and progress notes for long and short-term goals.     Barriers to Discharge  Current Status/Progress Possible Resolutions Date Resolved   Nursing                  PT  Decreased caregiver support;Home environment access/layout;Incontinence  unsure if family can provide 24 hr assist at d/c, unsure of home set up as info taken from chart              OT  SLP                SW Lack of/limited family support;Decreased caregiver support              Discharge Planning/Teaching Needs:  Pt to d/c to home with wife Maudie Mercury who will provide 24/7 care  family education as recommended by therapy   Team Discussion: MD can switch to oral diflucan, no further lumbar punctures needed.  RN inc, BM 3/29, bruising, skin tears L arm, PICC line in.  OT min A overall, limited cognition, has OCD tendencies, poor frustration tolerance, wife cleared for transfers to [facility].  PT min A overall, S goals.  SLP more alert today, medication  management work today, became confused with task, poor memory, poor problem solving.   Revisions to Treatment Plan: N/A     Medical Summary Current Status: improving cognition as a whole although there is some wax/wane. still on ampho for rx of cryptococcus Weekly Focus/Goal: ID considerations, skin, nutrition, volume mgt  Barriers to Discharge: Behavior;IV antibiotics;Medical stability   Possible Resolutions to Barriers: see medical progress notes   Continued Need for Acute Rehabilitation Level of Care: The patient requires daily medical management by a physician with specialized training in physical medicine and rehabilitation for the following reasons: Direction of a multidisciplinary physical rehabilitation program to maximize functional independence : Yes Medical management of patient stability for increased activity during participation in an intensive rehabilitation regime.: Yes Analysis of laboratory values and/or radiology reports with any subsequent need for medication adjustment and/or medical intervention. : Yes   I attest that I was present, lead the team conference, and concur with the assessment and plan of the team.   Jodell Cipro M 03/07/2020, 7:06 PM   Team conference was held via web/ teleconference due to [place] - 19

## 2020-03-07 NOTE — Progress Notes (Signed)
[facility] PHYSICAL MEDICINE & REHABILITATION PROGRESS NOTE   Subjective/Complaints: Up in bed with SLP. No new complaints today.  ROS: Patient denies fever, rash, sore throat, blurred vision, nausea, vomiting, diarrhea, cough, shortness of breath or chest pain,  , headache, or mood change.     Objective:   No results found. Recent Labs    03/06/20 0445  WBC 5.0  HGB 9.7*  HCT 29.1*  PLT 215   Recent Labs    03/06/20 0445 03/07/20 0420  NA 136 135  K 3.8 3.9  CL 103 103  CO2 26 25  GLUCOSE 98 102*  BUN 15 17  CREATININE 0.89 0.78  CALCIUM 8.8* 9.0    Intake/Output Summary (Last 24 hours) at 03/07/2020 1224 Last data filed at 03/07/2020 0715 Gross per 24 hour  Intake 2778.7 ml  Output 2700 ml  Net 78.7 ml     Physical Exam: Vital Signs Blood pressure 135/83, pulse 77, temperature 98.2 F (36.8 C), temperature source Oral, resp. rate 18, height 5\' 6"  (1.676 m), weight 54.5 kg, SpO2 99 %.  Constitutional: No distress . Vital signs reviewed. HEENT: EOMI, oral membranes moist Neck: supple Cardiovascular: RRR without murmur. No JVD    Respiratory/Chest: CTA Bilaterally without wheezes or rales. Normal effort    GI/Abdomen: BS +, non-tender, non-distended Ext: no clubbing, cyanosis, or edema Psych: pleasant and cooperative Skin: Warm and dry.  Foam dressing on left arm,  healed buttocks wounds Psych: alert oriented to person, date, place today. More on point.  Musc: No edema in extremities.  No tenderness in extremities. Neuro: sensory exam intact.  Motor: 4-4+/5 throughout, unchanged  Assessment/Plan: 1. 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  Physiatrist is providing close team supervision and 24 hour management of active medical problems listed below.  Physiatrist and rehab team continue to  assess barriers to discharge/monitor patient progress toward functional and medical goals  Care Tool:  Bathing    Body parts bathed by patient: Right arm, Left arm, Chest, Abdomen, Right upper leg, Left upper leg, Front perineal area, Face   Body parts bathed by helper: Left lower leg, Right lower leg, Buttocks     Bathing assist Assist Level: Moderate Assistance - Patient 50 - 74%     Upper Body Dressing/Undressing Upper body dressing   What is the patient wearing?: Hospital gown only    Upper body assist Assist Level: Maximal Assistance - Patient 25 - 49%    Lower Body Dressing/Undressing Lower body dressing      What is the patient wearing?: Incontinence brief     Lower body assist Assist for lower body dressing: Maximal Assistance - Patient 25 - 49%     Toileting Toileting    Toileting assist Assist for toileting: Maximal Assistance - Patient 25 - 49%     Transfers Chair/bed transfer  Transfers assist  Chair/bed transfer activity did not occur: Safety/medical concerns  Chair/bed transfer assist level: Minimal Assistance - Patient > 75%     Locomotion Ambulation   Ambulation assist      Assist level: Contact Guard/Touching assist Assistive device: Walker-rolling Max distance: 200 ft   Walk 10 feet activity   Assist  Walk 10 feet activity did not occur: Safety/medical concerns  Assist level: Contact Guard/Touching assist Assistive device: Walker-rolling   Walk 50 feet activity   Assist Walk 50  feet with 2 turns activity did not occur: Safety/medical concerns  Assist level: Contact Guard/Touching assist Assistive device: Walker-rolling    Walk 150 feet activity   Assist Walk 150 feet activity did not occur: Safety/medical concerns  Assist level: Contact Guard/Touching assist Assistive device: Walker-rolling    Walk 10 feet on uneven surface  activity   Assist Walk 10 feet on uneven surfaces activity did not occur: Safety/medical  concerns         Wheelchair     Assist Will patient use wheelchair at discharge?: No(per PT eval)      Wheelchair assist level: Minimal Assistance - Patient > 75% Max wheelchair distance: 100    Wheelchair 50 feet with 2 turns activity    Assist        Assist Level: Minimal Assistance - Patient > 75%   Wheelchair 150 feet activity     Assist      Assist Level: Moderate Assistance - Patient 50 - 74%   Blood pressure 135/83, pulse 77, temperature 98.2 F (36.8 C), temperature source Oral, resp. rate 18, height 5\' 6"  (1.676 m), weight 54.5 kg, SpO2 99 %.  Medical Problem List and Plan: 1.  Impaired Function secondary to cryptococcal meningitis in setting of recent COVID infection and immunosuppressed due to myasthenia gravis medcs Encephalopathy   Continue CIR therapies  -seems to be waxing and waning from cognitive standpoint but trending toward improvement  --Interdisciplinary Team Conference today     2. DVT/PE/ Antithrombotics:  -DVT/anticoagulation:  converted back to Eliquis   -will need to go back to iv heparin prior to repeat LP             -antiplatelet therapy: N/a 3. Pain Management: add sports rub for right SCM tenderness  -kpad  Controlled on 3/29 4. Mood: LCSW to monitor for evaluation and support when appropriate.              -antipsychotic agents: N/A 5. Neuropsych: This patient is not capable of making decisions on his own behalf.  Telesitter for safety 6. Skin/Wound Care: routine pressure relief measures.   Condom catheter nightly to prevent MASD given IVF   7. Fluids/Electrolytes/Nutrition: Monitor I/Os  -severe protein deficient malnutrition  -dietary supplements, RD following 8. Disseminated cryptococcal infection/meningoencephalitis: On amphotericin B daily and Flucytosine every 6 hours--  daily BMP, Mg and K+ as well as periodic LFTs.     3/22: Repeat LP 3/21: glucose 27, protein 145, wbc 104, CSF culture no growth, fungal  cultures no growth to date  -repeat LP prior to discharge per ID  -plan for 28 days of iv amphotericin  3/30 spoke to Dr. Megan Salon today. Can transition to oral fluconazole on 4/7. He will not order another LP  -I personally reviewed the patient's labs today.  Pharmacy following labs closely, supplementing K+, Mg++  9. Hyponatremia:   Sodium 135 on 3/30 10. Myasthenia gravis: Has been off Cellcept since Covid. Had been on prednisone long term prior to cellcept. Would not recommend tapering all the way off prednisone at this time. Continue 5mg  daily   - WIFE wants pt's Neurologist- a Emergency planning/management officer on Myasthenia gravis called with any med changes- Dr Westley Foots- [phone]  11. Post Covid hypoxia: Has resolved--current O2 sats on room ar 99%- continue to monitor.  Breathing comfortably.  12. Hypokalemia: Due to meds  Continue supplementation  Potassium 3.9 3/30 13. Stage I pressure ulcer and skin tears-  Improving see above 14. Leukocytosis: Resolved  Likely steroid related  -afebrile, abx as above 15.  Macrocytic anemia  Hemoglobin 9.7 3/29    16.  Urinary incontinence-likely secondary to cognition and amount of IVF  Condom cath at night, discussed with wife yesterday   Spoke with wife regarding patient's progress today   LOS: 12 days A FACE TO FACE EVALUATION WAS PERFORMED  [name] 03/07/2020, 12:24 PM

## 2020-03-08 ENCOUNTER — Inpatient Hospital Stay (HOSPITAL_COMMUNITY): Payer: Medicare HMO

## 2020-03-08 ENCOUNTER — Inpatient Hospital Stay (HOSPITAL_COMMUNITY): Payer: Medicare HMO | Admitting: Speech Pathology

## 2020-03-08 ENCOUNTER — Inpatient Hospital Stay (HOSPITAL_COMMUNITY): Payer: Medicare HMO | Admitting: Physical Therapy

## 2020-03-08 LAB — MAGNESIUM: Magnesium: 2 mg/dL (ref 1.7–2.4)

## 2020-03-08 LAB — BASIC METABOLIC PANEL
Anion gap: 5 (ref 5–15)
BUN: 20 mg/dL (ref 8–23)
CO2: 23 mmol/L (ref 22–32)
Calcium: 8.9 mg/dL (ref 8.9–10.3)
Chloride: 103 mmol/L (ref 98–111)
Creatinine, Ser: 0.95 mg/dL (ref 0.61–1.24)
GFR calc Af Amer: >60 mL/min (ref >60)
GFR calc non Af Amer: >60 mL/min (ref >60)
Glucose, Bld: 100 mg/dL — ABNORMAL HIGH (ref 70–99)
Potassium: 4.1 mmol/L (ref 3.5–5.1)
Sodium: 131 mmol/L — ABNORMAL LOW (ref 135–145)

## 2020-03-08 MED ORDER — BOOST / RESOURCE BREEZE PO LIQD CUSTOM
1.0000 | Freq: Three times a day (TID) | ORAL | Status: DC
  Administered 2020-03-08 – 2020-03-15 (×17): 1 via ORAL

## 2020-03-08 NOTE — Progress Notes (Signed)
Nutrition Follow-up  DOCUMENTATION CODES:   Severe malnutrition in context of acute illness/injury  INTERVENTION:   D/c Ensure Enlive due to pt refusing  Boost Breeze po TID, each supplement provides 250 kcal and 9 grams of protein  Magic cup TID with meals, each supplement provides 290 kcal and 9 grams of protein  Continue MVI  Continue 81ml Pro-stat BID   NUTRITION DIAGNOSIS:   Severe Malnutrition related to acute illness(recent covid) as evidenced by percent weight loss, moderate fat depletion, moderate muscle depletion.  Ongoing.  GOAL:   Patient will meet greater than or equal to 90% of their needs  Progressing.   MONITOR:   PO intake, Supplement acceptance, Skin, Labs, Weight trends  REASON FOR ASSESSMENT:   Malnutrition Screening Tool    ASSESSMENT:   Pt with a PMH of Myasthenia gravis, Covid 19 infection 1/85/63 complicated by cor pulmonale/PE/LLE DVT and found to have pulmonary nodules with sclerotic changes in right 3-5th ribs. He was d/c to home 01/03/20 on oxygen, readmitted on 02/09/20 with 2 week hx of progressive confusion and weakness.Pt found to be hyponatremic. MRI brain revealing several small areas of restricted diffusion in bilateral cerebellum. Pt underwent bronchoscopy 3/12 revealing species isolated.  LP showed elevated pressures and yeast noted on gram stain. 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 Therapy ongoing and continues to be debilitated with cognitive deficits and poor safety awareness. [facility] 3/[facility].  Discussed pt with RN.  Pt reports appetite is okay, but reports not liking the Ensure. RD had pt try Magic Cup and [employer], pt agreeable to both.   PO intake: 25-100% x last 8 recorded meals (50% average intake)  Medications reviewed and include: Ensure Enlive QID, 79ml Pro-stat BID, MVI, Klor-con, Deltasone, Sodium chloride, Thiamine  Labs reviewed:   Diet Order:    Diet Order            DIET DYS 3 Room service appropriate? Yes; Fluid consistency: Thin  Diet effective now              EDUCATION NEEDS:   Not appropriate for education at this time  Skin:  Skin Assessment: Skin Integrity Issues: Skin Integrity Issues:: Stage I, Other (Comment) Stage I: bilateral buttocks Other: road rash arm  Last BM:  3/24  Height:   Ht Readings from Last 1 Encounters:  02/24/20 5\' 6"  (1.676 m)    Weight:   Wt Readings from Last 1 Encounters:  03/07/20 54.5 kg    BMI:  Body mass index is 19.39 kg/m.  Estimated Nutritional Needs:   Kcal:  1600-1800  Protein:  80-90 grams  Fluid:  >1.6L/d   Larkin Ina, MS, RD, LDN RD pager number and weekend/on-call pager number located in [place].

## 2020-03-08 NOTE — Progress Notes (Addendum)
Pharmacy Antibiotic Note  Duane Santos a73 y.o.maleadmittedon 3/18/2021with meningitis. Pharmacy has been consulted for Ambisome and flucytosine (D#21) dosing for cryptococcal meningitis.  Assessment: 18 YOM with myasthenia gravis on prednisone/mycophenolate. Notedto have positive cryptococcus Ag and high titer.Started on amphotericin - liposomal and flucytosine evening on 3/11. He had LP and BAL performed 3/12 - CSF cx with yeast. Transferred to inpatient rehab on 3/18. 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  Scr todayis slightly elevated at 0.95. Would continue pre-dose 500 mL bolus and post-dose 1000 mL bolus fluids.Magnesium is WNL at 2. PotassiumisWNLat4.1todayon60 mEq supplementationdaily.  Plan: Continue amphotericin B 200 mg IV daily  Continue normal saline 500 mL pre-dose and 1000 mL post-dose for renal protection Continuepotassium 60 mEQ PO daily(split overtwo doses)  Continue to trend Scr and if elevated >1, consider increasing pre-dose NS bolus to 750 mL. Monitor renal function, electrolytes, clinical improvement  Height: _0  (167.6 cm) Weight: 120 lb 2.4 oz (54.5 kg) IBW/kg (Calculated) : 63.8  Temp (24hrs), Avg:98.5 F (36.9 C), Min:97.8 F (36.6 C), Max:98.9 F (37.2 C)  Recent Labs  Lab 03/02/20 0242 03/02/20 0242 03/03/20 0430 03/03/20 0430 03/04/20 0410 03/05/20 0419 03/06/20 0445 03/07/20 0420 03/08/20 0339  WBC 5.0  --  4.5  --   --   --  5.0  --   --   CREATININE 0.91   < > 0.93   < > 0.72 0.79 0.89 0.78 0.95   < > = values in this interval not displayed.    Estimated Creatinine Clearance: 54.2 mL/min (by C-G formula based on SCr of 0.95 mg/dL).    No Known Allergies  Antimicrobials this admission: Ambisome 3/11>> Flucytosine3/11>> Cefepime/Vancomycin 3/6>3/8 Zosyn 3/8 >>3/15  Thank you for allowing pharmacy to be a part of this patient's care.  Claudina Lick, PharmD Candidate 03/08/2020 6:45 AM

## 2020-03-08 NOTE — Progress Notes (Signed)
Occupational Therapy Session Note  Patient Details  Name: Duane Santos MRN: [id number] Date of Birth: 02-19-1946  Today's Date: 03/08/2020 OT Individual Time: 1415-1530 OT Individual Time Calculation (min): 75 min    Short Term Goals: Week 1:  OT Short Term Goal 1 (Week 1): Pt will complete toilet transfer with min A OT Short Term Goal 1 - Progress (Week 1): Met OT Short Term Goal 2 (Week 1): Pt will complete LB dressing with mod A OT Short Term Goal 2 - Progress (Week 1): Met OT Short Term Goal 3 (Week 1): Pt will self-feed 50% of meal with min A. OT Short Term Goal 3 - Progress (Week 1): Met  Skilled Therapeutic Interventions/Progress Updates:    1:1. Pt received in bed agreeable to bathing and dressing. Pt completes ambulatory transfer with RW to transfer into shower with MOD VC for managing RW and turnging to sit on TTB in walk in shower. Pt completes bathing sit to stand with MOD A for standing balance d/t significant posterior lean. occlusives applied to PICC with shower seal skin wrapOT washes back and buttocks and provides mod VC for sequencing bathing body parts/terminating bathing hair. Pt voids urine in shower. Pt completes UB dressing with VC for pulling long sleeve up past wrist. Pt dons pants with CGA and MAX VC for hand placement, weight shift forward with benefit from cue, "put your nose over the sink." Pt dons socks with set up. OT calls wife and communicates need for more clothing and educates on needing family training. 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 Pt completes 3 sit to stands with no UE use with max VC for anterior weight shift improving to CGA for last STS. Exited session with ptseated in bed, exit alarm on and call lightin reach  Therapy Documentation Precautions:  Precautions Precautions: Fall Restrictions Weight Bearing Restrictions:  No General:   Vital Signs: Therapy Vitals Temp: 98 F (36.7 C) Pulse Rate: (!) 106 Resp: 20 BP: 120/72 Patient Position (if appropriate): Sitting Oxygen Therapy SpO2: 100 % O2 Device: Room Air Pain:   ADL: ADL Eating: Maximal assistance Grooming: Moderate assistance Upper Body Bathing: Moderate assistance Lower Body Bathing: Maximal assistance Upper Body Dressing: Moderate assistance Lower Body Dressing: Maximal assistance Toileting: Maximal assistance Toilet Transfer: Moderate verbal cueing Vision   Perception    Praxis   Exercises:   Other Treatments:     Therapy/Group: Individual Therapy  Tonny Branch 03/08/2020, 2:52 PM

## 2020-03-08 NOTE — Progress Notes (Signed)
[place] PHYSICAL MEDICINE & REHABILITATION PROGRESS NOTE   Subjective/Complaints: No new issues. Slept last night, mild neck pain  ROS: Patient denies fever, rash, sore throat, blurred vision, nausea, vomiting, diarrhea, cough, shortness of breath or chest pain, joint or back pain, headache, or mood change. .     Objective:   No results found. Recent Labs    03/06/20 0445  WBC 5.0  HGB 9.7*  HCT 29.1*  PLT 215   Recent Labs    03/07/20 0420 03/08/20 0339  NA 135 131*  K 3.9 4.1  CL 103 103  CO2 25 23  GLUCOSE 102* 100*  BUN 17 20  CREATININE 0.78 0.95  CALCIUM 9.0 8.9    Intake/Output Summary (Last 24 hours) at 03/08/2020 0845 Last data filed at 03/08/2020 1610 Gross per 24 hour  Intake 920 ml  Output 2550 ml  Net -1630 ml     Physical Exam: Vital Signs Blood pressure 137/75, pulse 81, temperature 98.9 F (37.2 C), temperature source Oral, resp. rate 18, height 5\' 6"  (1.676 m), weight 54.5 kg, SpO2 100 %.  Constitutional: No distress . Vital signs reviewed. HEENT: EOMI, oral membranes moist Neck: supple Cardiovascular: RRR without murmur. No JVD    Respiratory/Chest: CTA Bilaterally without wheezes or rales. Normal effort    GI/Abdomen: BS +, non-tender, non-distended Ext: no clubbing, cyanosis, or edema Psych: pleasant and cooperative Skin: Warm and dry.  Foam dressing on left arm, buttocks not visualized Psych: alert oriented to person, place   ---early exam today Musc: No edema in extremities.  No tenderness in extremities. Neuro: sensory exam intact.  Motor: 4-4+/5 throughout, unchanged  Assessment/Plan: 1. 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  Physiatrist is providing close team supervision and 24 hour management of active medical problems listed below.  Physiatrist and rehab  team continue to assess barriers to discharge/monitor patient progress toward functional and medical goals  Care Tool:  Bathing    Body parts bathed by patient: Right arm, Left arm, Chest, Abdomen, Right upper leg, Left upper leg, Front perineal area, Face   Body parts bathed by helper: Left lower leg, Right lower leg, Buttocks     Bathing assist Assist Level: Moderate Assistance - Patient 50 - 74%     Upper Body Dressing/Undressing Upper body dressing   What is the patient wearing?: Hospital gown only    Upper body assist Assist Level: Maximal Assistance - Patient 25 - 49%    Lower Body Dressing/Undressing Lower body dressing      What is the patient wearing?: Incontinence brief     Lower body assist Assist for lower body dressing: Maximal Assistance - Patient 25 - 49%     Toileting Toileting    Toileting assist Assist for toileting: Maximal Assistance - Patient 25 - 49%     Transfers Chair/bed transfer  Transfers assist  Chair/bed transfer activity did not occur: Safety/medical concerns  Chair/bed transfer assist level: Minimal Assistance - Patient > 75%     Locomotion Ambulation   Ambulation assist      Assist level: Contact Guard/Touching assist Assistive device: Walker-rolling Max distance: 200 ft   Walk 10 feet activity   Assist  Walk 10 feet activity did not occur: Safety/medical concerns  Assist level: Contact Guard/Touching assist Assistive device: Walker-rolling   Walk 50 feet activity   Assist Walk  50 feet with 2 turns activity did not occur: Safety/medical concerns  Assist level: Contact Guard/Touching assist Assistive device: Walker-rolling    Walk 150 feet activity   Assist Walk 150 feet activity did not occur: Safety/medical concerns  Assist level: Contact Guard/Touching assist Assistive device: Walker-rolling    Walk 10 feet on uneven surface  activity   Assist Walk 10 feet on uneven surfaces activity did not  occur: Safety/medical concerns         Wheelchair     Assist Will patient use wheelchair at discharge?: No(per PT eval)      Wheelchair assist level: Minimal Assistance - Patient > 75% Max wheelchair distance: 100    Wheelchair 50 feet with 2 turns activity    Assist        Assist Level: Minimal Assistance - Patient > 75%   Wheelchair 150 feet activity     Assist      Assist Level: Moderate Assistance - Patient 50 - 74%   Blood pressure 137/75, pulse 81, temperature 98.9 F (37.2 C), temperature source Oral, resp. rate 18, height 5\' 6"  (1.676 m), weight 54.5 kg, SpO2 100 %.  Medical Problem List and Plan: 1.  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 DVT/PE/ Antithrombotics:  -DVT/anticoagulation:  converted back to Eliquis   -will need to go back to iv heparin prior to repeat LP             -antiplatelet therapy: N/a 3. Pain Management: add sports rub for right SCM tenderness  -kpad  Controlled on 3/31 4. Mood: LCSW to monitor for evaluation and support when appropriate.              -antipsychotic agents: N/A 5. Neuropsych: This patient is not capable of making decisions on his own behalf.  Telesitter for safety 6. Skin/Wound Care: routine pressure relief measures.   Condom catheter nightly to prevent MASD given IVF   7. Fluids/Electrolytes/Nutrition: Monitor I/Os  -severe protein deficient malnutrition  -dietary supplements, RD following 8. Disseminated cryptococcal infection/meningoencephalitis: On amphotericin B daily and Flucytosine every 6 hours--  daily BMP, Mg and K+ as well as periodic LFTs.     3/22: Repeat LP 3/21: glucose 27, protein 145, wbc 104, CSF culture no growth, fungal cultures no growth to  date  -repeat LP prior to discharge per ID  -plan for 28 days of iv amphotericin  3/30 spoke to Dr. Megan Salon today. Can transition to oral fluconazole on 4/7. He will not order another LP  -pharmacy following labs/Cr closely 9. Hyponatremia:   Sodium 131 3/31 10. Myasthenia gravis: Has been off Cellcept since Covid. Had been on prednisone long term prior to cellcept. Would not recommend tapering all the way off prednisone at this time. Continue 5mg  daily   - WIFE requests that pt's Neurologist- a national expert on Myasthenia gravis called with any med changes- Dr Westley Foots- [phone]  11. Post Covid hypoxia: Has resolved--current O2 sats on room ar 99%- continue to monitor.  Breathing comfortably.  12. Hypokalemia: Due to meds  Continue supplementation  Potassium 4.1 3/31  Mag 2.0 13. Stage I pressure ulcer and skin tears-  Improving see above 14. Leukocytosis: Resolved    Likely steroid related  -afebrile, abx as above 15.  Macrocytic anemia  Hemoglobin 9.7 3/29    16.  Urinary incontinence-likely secondary to cognition and amount of IVF  Condom cath at night, discussed with wife on Monday       LOS: 13 days A FACE TO [place] 03/08/2020, 8:45 AM

## 2020-03-08 NOTE — Progress Notes (Signed)
Physical Therapy Session Note  Patient Details  Name: Duane Santos MRN: [id number] Date of Birth: 06/06/1946  Today's Date: 03/08/2020 PT Individual Time: 1010-1049 PT Individual Time Calculation (min): 39 min   Short Term Goals: Week 2:  PT Short Term Goal 1 (Week 2): Pt will complete car transfer with LRAD & min assist. PT Short Term Goal 2 (Week 2): Pt will negotiate 4 steps with B rails & CGA. PT Short Term Goal 3 (Week 2): Pt will consistently complete bed<>w/c transfers with LRAD & CGA.  Skilled Therapeutic Interventions/Progress Updates:  Pt received asleep in bed but awakened & agreeable to tx. Pt transfers supine>sit with supervision with hospital bed features. Pt completes sit<>stand with CGA with cuing for hand placement on stable surface. Pt ambulates in room/bathroom with RW & CGA except min assist to correct posterior LOB when navigating small ramp into bathroom. Pt with incontinent void in brief & small BM on toilet; pt performs toilet transfer with cuing to use grab bar & CGA. Pt performs peri hygiene without assistance. Therapist provides assistance for donning clean brief & pants for time management. Pt ambulates out of bathroom with min assist with max cuing for RW management as pt hitting it on L side of doorframe. Pt stands & performs hand hygiene & applying deodorant with close supervision increasing to min assist as pt with forward lean onto sink. Pt requesting to call wife & therapist provided assistance for dialing her phone number & pt requiring total cuing to recognize he received her voicemail but pt with difficulty understanding & did not leave a message, then pt requires max cuing for redirection with pt becoming irritated with therapist. Pt dons top with supervision assist while sitting in w/c. Maudie Mercury then calls pt on room phone & pt frequently referring to location as "jail". Pt left in w/c with chair alarm donned, call bell in reach, telesitter in room. Pt missed time  2/2 being on phone. Notified nurse of pt's increased irritation & confusion during session.  Therapy Documentation Precautions:  Precautions Precautions: Fall Restrictions Weight Bearing Restrictions: No   General: PT Amount of Missed Time (min): 21 Minutes PT Missed Treatment Reason: (pt on phone)  Pain: No c/o pain reported during session.  Therapy/Group: Individual Therapy  [name] 03/08/2020, 10:52 AM

## 2020-03-08 NOTE — Progress Notes (Signed)
Speech Language Pathology Daily Session Note  Patient Details  Name: Duane Santos MRN: [id number] Date of Birth: Oct 05, 1946  Today's Date: 03/08/2020 SLP Individual Time: 0730-0825 SLP Individual Time Calculation (min): 55 min  Short Term Goals: Week 2: SLP Short Term Goal 1 (Week 2): Pt will sustain attention to basic task for 15  minutes with Max A cues. SLP Short Term Goal 2 (Week 2): Pt will utilize external memory aids to recall orientation information with Mod A cues. SLP Short Term Goal 3 (Week 2): Patient will verbalize 2 physical and 2 cognitive deficits with Max A mulitmodal cues. SLP Short Term Goal 4 (Week 2): Patient will demonstrate functional problem solving for basic and familiar tasks with Max A verbal cues.  Skilled Therapeutic Interventions: Skilled treatment session focused on cognitive goals. Upon arrival, patient was awake in bed eating breakfast but appeared confused. Patient was independently oriented to place and date but required total A for orientation to day of week. Patient alternated attention between self-feeding and functional conversation with Min verbal cues for ~20 minutes. Patient was incontinent of urine and had soaked through his brief and required Min verbal cues for problem solving with task. Patient did not have any clean clothes left and SLP suggested calling his wife to request items, however, he declined because he thought she would be asleep. SLP left a note as a reminder to call his wife later in the day. Patient left upright in bed with alarm on and all needs within reach. Continue with current plan of care.      Pain No/Denies Pain   Therapy/Group: Individual Therapy  Erilyn Pearman 03/08/2020, 12:31 PM

## 2020-03-09 ENCOUNTER — Inpatient Hospital Stay (HOSPITAL_COMMUNITY): Payer: Medicare HMO | Admitting: Speech Pathology

## 2020-03-09 ENCOUNTER — Inpatient Hospital Stay (HOSPITAL_COMMUNITY): Payer: Medicare HMO

## 2020-03-09 ENCOUNTER — Inpatient Hospital Stay (HOSPITAL_COMMUNITY): Payer: Medicare HMO | Admitting: Occupational Therapy

## 2020-03-09 LAB — BASIC METABOLIC PANEL
Anion gap: 8 (ref 5–15)
BUN: 21 mg/dL (ref 8–23)
CO2: 23 mmol/L (ref 22–32)
Calcium: 9 mg/dL (ref 8.9–10.3)
Chloride: 105 mmol/L (ref 98–111)
Creatinine, Ser: 0.86 mg/dL (ref 0.61–1.24)
GFR calc Af Amer: >60 mL/min (ref >60)
GFR calc non Af Amer: >60 mL/min (ref >60)
Glucose, Bld: 96 mg/dL (ref 70–99)
Potassium: 4 mmol/L (ref 3.5–5.1)
Sodium: 136 mmol/L (ref 135–145)

## 2020-03-09 LAB — MAGNESIUM: Magnesium: 1.7 mg/dL (ref 1.7–2.4)

## 2020-03-09 MED ORDER — MAGNESIUM SULFATE 2 GM/50ML IV SOLN
2.0000 g | Freq: Once | INTRAVENOUS | Status: AC
  Administered 2020-03-09: 2 g via INTRAVENOUS
  Filled 2020-03-09 (×2): qty 50

## 2020-03-09 NOTE — Progress Notes (Signed)
Social Work Patient ID: Duane Santos, male   DOB: 04-May-1946, 74 y.o.   MRN: [id number]   SW received call from [facility] inquiring if pt remains on IV abx. SW informed per medical team, pt last day of IV abx will be 03/15/2020 with plans to switch pt to oral meds. States she will wait for follow-up clinicals. No further questions/concerns.   Loralee Pacas, MSW, [place] Office: [phone] Cell: [phone] Fax: [phone]

## 2020-03-09 NOTE — Progress Notes (Signed)
Speech Language Pathology Weekly Progress and Session Note  Patient Details  Name: SPARSH CALLENS MRN: [id number] Date of Birth: [birth year]  Beginning of progress report period: March 02, 2020 End of progress report period: March 09, 2020  Today's Date: 03/09/2020 SLP Individual Time: 0725-0820 SLP Individual Time Calculation (min): 55 min  Short Term Goals: Week 2: SLP Short Term Goal 1 (Week 2): Pt will sustain attention to basic task for 15  minutes with Max A cues. SLP Short Term Goal 1 - Progress (Week 2): Met SLP Short Term Goal 2 (Week 2): Pt will utilize external memory aids to recall orientation information with Mod A cues. SLP Short Term Goal 2 - Progress (Week 2): Met SLP Short Term Goal 3 (Week 2): Patient will verbalize 2 physical and 2 cognitive deficits with Max A mulitmodal cues. SLP Short Term Goal 3 - Progress (Week 2): Met SLP Short Term Goal 4 (Week 2): Patient will demonstrate functional problem solving for basic and familiar tasks with Max A verbal cues. SLP Short Term Goal 4 - Progress (Week 2): Met    New Short Term Goals: Week 3: SLP Short Term Goal 1 (Week 3): STGs=LTGs due ELOS  Weekly Progress Updates: Patient has made functional but inconsistent gains and has met 4 of 4 STGs this reporting period. Currently, patient requires overall Max A multimodal cues to complete functional and familiar tasks safely in regards to problem sloving, intellectual awareness, recall of orientation information with use of external aid and sustained attention to functional tasks. Patient's function can be limited at times by fatigue, poor frustration tolerance and perseveration. Patient and family education ongoing. Patient would benefit from continued skilled SLP intervention to maximize his cognitive functioning and overall functional independence prior to discharge.      Intensity: Minumum of 1-2 x/day, 30 to 90 minutes Frequency: 3 to 5 out of 7 days Duration/Length of Stay:  03/15/20 Treatment/Interventions: Functional tasks;Patient/family education;Cognitive remediation/compensation;Cueing hierarchy;Environmental controls;Therapeutic Activities;Internal/external aids   Daily Session  Skilled Therapeutic Interventions:  Skilled treatment session focused on cognitive goals. SLP facilitated session by providing Min A verbal cues for alternating attention between a functional conversation and self-feeding his breakfast meal for ~30 minutes. Patient demonstrated increased intellectual awareness of deficits and their impact on his overall function with Min verbal cues. Patient was independently oriented to place and city with Min verbal and visual cues needed for orientation to time. Patient left upright in bed with alarm on and all needs within reach. Continue with current plan of care.     Pain No/Denies Pain   Therapy/Group: Individual Therapy  Mava Suares 03/09/2020, 6:30 AM

## 2020-03-09 NOTE — Progress Notes (Signed)
[place] PHYSICAL MEDICINE & REHABILITATION PROGRESS NOTE   Subjective/Complaints: Up with SLP. No new complaints. Neck a little sore. Ate 50% breakfast  ROS: Patient denies fever, rash, sore throat, blurred vision, nausea, vomiting, diarrhea, cough, shortness of breath or chest pain, joint or back pain, headache, or mood change.    Objective:   No results found. No results for input(s): WBC, HGB, HCT, PLT in the last 72 hours. Recent Labs    03/08/20 0339 03/09/20 0500  NA 131* 136  K 4.1 4.0  CL 103 105  CO2 23 23  GLUCOSE 100* 96  BUN 20 21  CREATININE 0.95 0.86  CALCIUM 8.9 9.0    Intake/Output Summary (Last 24 hours) at 03/09/2020 1136 Last data filed at 03/09/2020 0852 Gross per 24 hour  Intake 1114 ml  Output 2250 ml  Net -1136 ml     Physical Exam: Vital Signs Blood pressure 113/63, pulse 84, temperature 98.7 F (37.1 C), temperature source Oral, resp. rate 18, height 5\' 6"  (1.676 m), weight 54.5 kg, SpO2 98 %.  Constitutional: No distress . Vital signs reviewed. HEENT: EOMI, oral membranes moist Neck: supple Cardiovascular: RRR without murmur. No JVD    Respiratory/Chest: CTA Bilaterally without wheezes or rales. Normal effort    GI/Abdomen: BS +, non-tender, non-distended Ext: no clubbing, cyanosis, or edema Psych: pleasant and cooperative Skin: Warm and dry. Buttocks completely clear, foam dressing arm Psych: alert oriented to person, place, month Musc: No edema in extremities.  No tenderness in extremities. Neuro: sensory exam intact.  Motor: 4-4+/5 throughout, unchanged  Assessment/Plan: 1. 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  Physiatrist is providing close team supervision and 24 hour management of active medical problems listed below.  Physiatrist and rehab team continue to  assess barriers to discharge/monitor patient progress toward functional and medical goals  Care Tool:  Bathing    Body parts bathed by patient: Right arm, Left arm, Chest, Abdomen, Right upper leg, Left upper leg, Front perineal area, Face   Body parts bathed by helper: Left lower leg, Right lower leg, Buttocks     Bathing assist Assist Level: Moderate Assistance - Patient 50 - 74%     Upper Body Dressing/Undressing Upper body dressing   What is the patient wearing?: Hospital gown only    Upper body assist Assist Level: Maximal Assistance - Patient 25 - 49%    Lower Body Dressing/Undressing Lower body dressing      What is the patient wearing?: Incontinence brief     Lower body assist Assist for lower body dressing: Maximal Assistance - Patient 25 - 49%     Toileting Toileting    Toileting assist Assist for toileting: Maximal Assistance - Patient 25 - 49%     Transfers Chair/bed transfer  Transfers assist  Chair/bed transfer activity did not occur: Safety/medical concerns  Chair/bed transfer assist level: Minimal Assistance - Patient > 75%     Locomotion Ambulation   Ambulation assist      Assist level: Minimal Assistance - Patient > 75% Assistive device: Walker-rolling Max distance: 15 ft   Walk 10 feet activity   Assist  Walk 10 feet activity did not occur: Safety/medical concerns  Assist level: Minimal Assistance - Patient > 75% Assistive device: Walker-rolling   Walk 50 feet activity   Assist Walk 50 feet with 2 turns activity did  not occur: Safety/medical concerns  Assist level: Contact Guard/Touching assist Assistive device: Walker-rolling    Walk 150 feet activity   Assist Walk 150 feet activity did not occur: Safety/medical concerns  Assist level: Contact Guard/Touching assist Assistive device: Walker-rolling    Walk 10 feet on uneven surface  activity   Assist Walk 10 feet on uneven surfaces activity did not occur:  Safety/medical concerns         Wheelchair     Assist Will patient use wheelchair at discharge?: No(per PT eval)      Wheelchair assist level: Minimal Assistance - Patient > 75% Max wheelchair distance: 100    Wheelchair 50 feet with 2 turns activity    Assist        Assist Level: Minimal Assistance - Patient > 75%   Wheelchair 150 feet activity     Assist      Assist Level: Moderate Assistance - Patient 50 - 74%   Blood pressure 113/63, pulse 84, temperature 98.7 F (37.1 C), temperature source Oral, resp. rate 18, height 5\' 6"  (1.676 m), weight 54.5 kg, SpO2 98 %.  Medical Problem List and Plan: 1.  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 DVT/PE/ Antithrombotics:  -DVT/anticoagulation:  converted back to Eliquis   -will need to go back to iv heparin prior to repeat LP             -antiplatelet therapy: N/a 3. Pain Management: add sports rub for right SCM tenderness  -kpad  Controlled on 4/1 4. Mood: LCSW to monitor for evaluation and support when appropriate.              -antipsychotic agents: N/A 5. Neuropsych: This patient is not capable of making decisions on his own behalf.  Telesitter for safety 6. Skin/Wound Care: routine pressure relief measures.   Condom catheter nightly to prevent MASD given IVF  -backside looks great 7. Fluids/Electrolytes/Nutrition: Monitor I/Os  -severe protein deficient malnutrition  -dietary supplements, RD following 8. Disseminated cryptococcal infection/meningoencephalitis: On amphotericin B daily and Flucytosine every 6 hours--  daily BMP, Mg and K+ as well as periodic LFTs.     3/22: Repeat LP 3/21: glucose 27, protein 145, wbc 104, CSF culture no growth, fungal cultures  no growth to date  -repeat LP prior to discharge per ID  -plan for 28 days of iv amphotericin  3/30 spoke to Dr. Megan Salon . Can transition to oral fluconazole on 4/7. He will not order another LP  -pharmacy following labs/Cr closely 9. Hyponatremia:   Sodium 136 4/1 10. Myasthenia gravis: Has been off Cellcept since Covid. Had been on prednisone long term prior to cellcept. Would not recommend tapering all the way off prednisone at this time. Continue 5mg  daily   - WIFE requests that pt's Neurologist- a national expert on Myasthenia gravis called with any med changes- Dr Westley Foots- [phone]  11. Post Covid hypoxia: Has resolved--current O2 sats on room ar 99%- continue to monitor.  Breathing comfortably.  12. Hypokalemia: Due to meds  Continue supplementation  Potassium 4.0 4/1  Mag 1/7 4/1 13. Stage I pressure ulcer and skin tears-  Improving see above 14. Leukocytosis: Resolved    Likely steroid related  -afebrile, abx as above 15.  Macrocytic anemia  Hemoglobin 9.7 3/29    16.  Urinary incontinence-likely secondary to cognition and amount of IVF  Condom cath at night, discussed with wife on Monday  -remains intermittently incontinent      LOS: 14 days A FACE TO [place] 03/09/2020, 11:36 AM

## 2020-03-09 NOTE — Progress Notes (Signed)
Pharmacy Antibiotic Note  Duane Santos is a 74 y.o. male admitted on 02/24/2020 with meningitis. Pharmacy has been consulted for Ambisome and flucytosine (D#21) dosing for cryptococcal meningitis.   Assessment: 68 YOM with myasthenia gravis on prednisone/mycophenolate. Noted to have positive cryptococcus Ag and high titer. Started on amphotericin - liposomal and flucytosine evening on 3/11. He had LP and BAL performed 3/12 - CSF cx with yeast. Transferred to inpatient rehab on 3/18. 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   Scr today is down today to 0.86. Would continue pre-dose 500 mL bolus and post-dose 1000 mL bolus fluids. Magnesium is slightly low at 1.7. Potassium is WNL at 4.0 today on 60 mEq supplementation daily.    Plan: Continue amphotericin B 200 mg IV daily  Continue normal saline 500 mL pre-dose and 1000 mL post-dose for renal protection  Continue potassium 60 mEQ PO daily (split over two doses) Mg sulfate 2 gm IV x1 today   Continue to trend Scr and if elevated >1, consider increasing pre-dose NS bolus to 750 mL.  Monitor renal function, electrolytes, clinical improvement   Height: _0  (167.6 cm) Weight: 54.5 kg (120 lb 2.4 oz) IBW/kg (Calculated) : 63.8  Temp (24hrs), Avg:98.3 F (36.8 C), Min:98 F (36.7 C), Max:98.7 F (37.1 C)  Recent Labs  Lab 03/03/20 0430 03/04/20 0410 03/05/20 0419 03/06/20 0445 03/07/20 0420 03/08/20 0339 03/09/20 0500  WBC 4.5  --   --  5.0  --   --   --   CREATININE 0.93   < > 0.79 0.89 0.78 0.95 0.86   < > = values in this interval not displayed.    Estimated Creatinine Clearance: 59.9 mL/min (by C-G formula based on SCr of 0.86 mg/dL).    Santos Known Allergies  Antimicrobials this admission: Ambisome 3/11>> Flucytosine 3/11>> Cefepime/Vancomycin 3/6>3/8 Zosyn 3/8 >> 3/15  Thank you for allowing pharmacy to be a part of this patient's care.  Jimmy Footman, PharmD, BCPS, BCIDP Infectious Diseases Clinical  Pharmacist Phone: [phone] 03/09/2020 8:38 AM

## 2020-03-09 NOTE — Progress Notes (Signed)
Occupational Therapy Weekly Progress Note  Patient Details  Name: ORDEAN FOUTS MRN: [id number] Date of Birth: January 05, 1946  Beginning of progress report period: February 25, 2020 End of progress report period: March 09, 2020  Today's Date: 03/09/2020 OT Individual Time: 1303-1400 OT Individual Time Calculation (min): 57 min    Patient has met 2 of 3 short term goals.  Pt is making steady progress towards OT goals at this time. Pt is at an overall CGA/min A level for basic BADL tasks. Pt continues to be limited by cognitive deficits related to attention, awareness, initiation, and problem solving. PT does better in familiar functional task, but has a tendency to perseverate and have difficulty terminating tasks. Continue current POC.  Patient continues to demonstrate the following deficits: muscle weakness, impaired timing and sequencing, decreased coordination and decreased motor planning, decreased initiation, decreased attention, decreased awareness, decreased problem solving, decreased safety awareness, decreased memory and delayed processing and decreased sitting balance, decreased standing balance, decreased postural control and decreased balance strategies and therefore will continue to benefit from skilled OT intervention to enhance overall performance with BADL and Reduce care partner burden.  Patient progressing toward long term goals..  Continue plan of care.  OT Short Term Goals Week 2:  OT Short Term Goal 1 (Week 2): Pt will maintain stanidng balance within BADL tasks with min A OT Short Term Goal 1 - Progress (Week 2): Met OT Short Term Goal 2 (Week 2): Pt will terminate task at appropriate time with min questioning cues. OT Short Term Goal 2 - Progress (Week 2): Progressing toward goal OT Short Term Goal 3 (Week 2): Pt will tolerate standing for 5 minutes within grooming task OT Short Term Goal 3 - Progress (Week 2): Met Week 3:  OT Short Term Goal 1 (Week 3): LTG=STG 2/2  ELOS  Skilled Therapeutic Interventions/Progress Updates:    Pt greeted seated in wc with lunch tray present and was done eating. OT asked pt if he needed to use the bathroom and pt stated " I probably should." Functional ambulation into bathroom with RW and CGA with verbal cues for RW management. Pt with verbal cues for awareness of  BLEs when turning to sit on BSC over toilet. Pt able to void bladder and complete peri-care with CGA for balance when pulling up pants. Pt washed hands and wanted to brush teeth. Worked on terminating task in more timely manner. OT asked pt how long he wanted therapist to set a timer for toothbrushing and he said 7 minutes. OT provided verbal prompt when 2 minutes remained and pt was able to finish up with brushing, rinse mouth, and begin flossing before timer ran out. Pt did go over time limit to floss teeth, but did a much better job terminating task today. Pt brought down to therapy gym and completed dynavision activity in standing without AD and focus on reaction time, visual scanning, and balance. OT placed new cover on wc cushion, then worked on functional ambulation with focus on body position in RW. Pt returned to room and left seated in wc with alarm belt on, call bell in reach, and needs met.   Therapy Documentation Precautions:  Precautions Precautions: Fall Restrictions Weight Bearing Restrictions: No Pain:  denies pain  Therapy/Group: Individual Therapy  Valma Cava 03/09/2020, 1:22 PM

## 2020-03-09 NOTE — Progress Notes (Signed)
Physical Therapy Session Note  Patient Details  Name: Duane Santos MRN: [id number] Date of Birth: 09-May-1946  Today's Date: 03/09/2020 PT Individual Time: 1115-1155 PT Individual Time Calculation (min): 40 min   Short Term Goals: Week 2:  PT Short Term Goal 1 (Week 2): Pt will complete car transfer with LRAD & min assist. PT Short Term Goal 2 (Week 2): Pt will negotiate 4 steps with B rails & CGA. PT Short Term Goal 3 (Week 2): Pt will consistently complete bed<>w/c transfers with LRAD & CGA.  Skilled Therapeutic Interventions/Progress Updates:   Pt received sitting in WC and agreeable to PT for make up session. Pt instructed in WC propulsion x 151f with min assist overall. Constant veer to the L throughout WC mobility training requiring min assist and moderate cues for symmetry. Pt performed sit<>stand transfer with supervision assist. Note to have been incontinent of bladder. Pt returned to room and performed lower body dressing with increased time and supervision assist. Gait training in hall with RW x 2063fand supervision assist. No LOB noted, but required cues for safety and AD management in turns. [facility] returned to [facility] and [facility] reach and all needs met.            Therapy Documentation Precautions:  Precautions Precautions: Fall Restrictions Weight Bearing Restrictions: No Pain: denies   Therapy/Group: Individual Therapy  AuLorie Phenix/12/2019, 11:58 AM

## 2020-03-09 NOTE — Progress Notes (Signed)
Physical Therapy Session Note  Patient Details  Name: Duane Santos MRN: [id number] Date of Birth: November 11, 1946  Today's Date: 03/09/2020 PT Individual Time: 1015-1045 and 14:00-14:30 PT Individual Time Calculation (min): 30 min and 30 min  Short Term Goals: Week 1:  PT Short Term Goal 1 (Week 1): Pt will complete bed<>w/c transfers with LRAD & CGA. PT Short Term Goal 1 - Progress (Week 1): Progressing toward goal PT Short Term Goal 2 (Week 1): Pt will ambulate 75 ft with LRAD & CGA. PT Short Term Goal 2 - Progress (Week 1): Met PT Short Term Goal 3 (Week 1): Pt will negotiate 8 steps with B rails with min assist. PT Short Term Goal 3 - Progress (Week 1): Met Week 2:  PT Short Term Goal 1 (Week 2): Pt will complete car transfer with LRAD & min assist. PT Short Term Goal 2 (Week 2): Pt will negotiate 4 steps with B rails & CGA. PT Short Term Goal 3 (Week 2): Pt will consistently complete bed<>w/c transfers with LRAD & CGA. Week 3:     Skilled Therapeutic Interventions/Progress Updates:  AM session:  PAIN denies pain    Pt initially supine and sleeping, fairly easy to rouse.   Pt found to be totally incontinent of urine w/copious amount of urine in brief and bed, condom cath partially off.  Supine to sit w/mod assist.  STS w/min assist, mod assist for static balance without AD as therapist removed brief and gown, placed dry brief and sPT to wc w mod assist to avoid returning to wet bet.  NT called for assist w/bedding/catheter.    Pt assisted w/donning clean shirt and pants, STS to complete LB dressing w/mod assist w/RW and min assist for balance while raising pants.  Gait 80f w/min assist w/shuffling gait pattern and cues to increase step length and clearance/height, pt stated he needed "to pee" , turn w/min assist and gait 174fto commode w/min assist and cues.  Turns and backs to commode w/min assist and cues, mod assist to remove brief and lower pants, stand to sit w/cga and cues.  Pt  assisted w/bathing of perinium in sitting w/set up assist and cues, STS w/min assist and stood w/RW while therapist completed  Bathing of legs and perineal area due to urinary incontinence.  Pt returned to sitting on commode w/cga and cues.  NT arrived and pt left w/nursing to replace condom catheter/complete toileting.   PM session: PAIN  Denies pain  Pt initially OOB in wc and agreeable to treatment session with focus on functional mobility.  Pt did not recall earlier session w/this therapist.  Pt not oriented but follows commands.  Pt transported to gym for session.  STS from wc w/cga, gait [id number]/RW and cues for increased step length/to avoid shuffling, pt spontaneously scans environment, somewhat distracted.    wc propulsion x [id number] LEs for LE strengthening, min assist due to decreased traction w/socks.  Stairs: Ascended/descended 8stairs w/2 rails w/min assist and cues for step to pattern for safety.  Repeated sts w/cues for safe hand placemnt x 5 reps.  Repeated wc propulsion as above.   Pt transported back to room. Pt left oob in wc w/alarm belt set and needs in reach    Therapy Documentation Precautions:  Precautions Precautions: Fall Restrictions Weight Bearing Restrictions: No    Therapy/Group: Individual Therapy  BarbCallie Fielding   BarbJerrilyn Cairo/2021, 3:57 PM

## 2020-03-10 ENCOUNTER — Inpatient Hospital Stay (HOSPITAL_COMMUNITY): Payer: Medicare HMO | Admitting: Speech Pathology

## 2020-03-10 ENCOUNTER — Inpatient Hospital Stay (HOSPITAL_COMMUNITY): Payer: Medicare HMO | Admitting: Physical Therapy

## 2020-03-10 ENCOUNTER — Inpatient Hospital Stay (HOSPITAL_COMMUNITY): Payer: Medicare HMO | Admitting: Occupational Therapy

## 2020-03-10 LAB — BASIC METABOLIC PANEL
Anion gap: 9 (ref 5–15)
BUN: 17 mg/dL (ref 8–23)
CO2: 23 mmol/L (ref 22–32)
Calcium: 8.8 mg/dL — ABNORMAL LOW (ref 8.9–10.3)
Chloride: 102 mmol/L (ref 98–111)
Creatinine, Ser: 0.75 mg/dL (ref 0.61–1.24)
GFR calc Af Amer: >60 mL/min (ref >60)
GFR calc non Af Amer: >60 mL/min (ref >60)
Glucose, Bld: 94 mg/dL (ref 70–99)
Potassium: 3.6 mmol/L (ref 3.5–5.1)
Sodium: 134 mmol/L — ABNORMAL LOW (ref 135–145)

## 2020-03-10 LAB — MAGNESIUM: Magnesium: 1.9 mg/dL (ref 1.7–2.4)

## 2020-03-10 MED ORDER — POTASSIUM CHLORIDE CRYS ER 20 MEQ PO TBCR
40.0000 meq | EXTENDED_RELEASE_TABLET | Freq: Once | ORAL | Status: AC
  Administered 2020-03-10: 40 meq via ORAL
  Filled 2020-03-10: qty 2

## 2020-03-10 MED ORDER — MAGNESIUM SULFATE 2 GM/50ML IV SOLN
2.0000 g | Freq: Once | INTRAVENOUS | Status: AC
  Administered 2020-03-10: 2 g via INTRAVENOUS
  Filled 2020-03-10: qty 50

## 2020-03-10 NOTE — Progress Notes (Signed)
Physical Therapy Weekly Progress Note  Patient Details  Name: Duane Santos MRN: [id number] Date of Birth: 1946-11-22  Beginning of progress report period: March 03, 2020 End of progress report period: March 10, 2020  Today's Date: 03/10/2020 PT Individual Time: 7829-5621 PT Individual Time Calculation (min): 54 min   Patient has met 2 of 3 short term goals.  Pt is making steady progress towards LTG's but has demonstrated fluctuating impaired cognition & required as much as min assist or as little as supervision assist for functional mobility. Pt requires RW for ambulation. Pt would benefit from continued skilled PT treatment to focus on transfers, gait, balance, endurance, cognition, and for caregiver training to ensure safe d/c home.  Patient continues to demonstrate the following deficits muscle weakness, decreased cardiorespiratoy endurance, decreased coordination, decreased attention to left, decreased initiation, decreased attention, decreased awareness, decreased problem solving, decreased safety awareness, decreased memory and delayed processing, and decreased sitting balance, decreased standing balance, decreased postural control and decreased balance strategies and therefore will continue to benefit from skilled PT intervention to increase functional independence with mobility.  Patient progressing toward long term goals..  Continue plan of care.  PT Short Term Goals Week 2:  PT Short Term Goal 1 (Week 2): Pt will complete car transfer with LRAD & min assist. PT Short Term Goal 1 - Progress (Week 2): Met PT Short Term Goal 2 (Week 2): Pt will negotiate 4 steps with B rails & CGA. PT Short Term Goal 2 - Progress (Week 2): Progressing toward goal PT Short Term Goal 3 (Week 2): Pt will consistently complete bed<>w/c transfers with LRAD & CGA. PT Short Term Goal 3 - Progress (Week 2): Met Week 3:  PT Short Term Goal 1 (Week 3): STG = LTG due to estimated d/c date.  Skilled Therapeutic  Interventions/Progress Updates:  Pt received in w/c & agreeable to tx.   Sit<>stands throughout session with supervision & cuing for hand placement on stable surface.  Gait x 300 ft with RW & supervision with pt demonstrating decreased weight shift R and decreased foot clearance (L>R) with cuing to correct & fair/good return demo from pt.  Gait in room/bathroom with RW & supervision & pt stands to have continent void with supervision. Hand hygiene standing at sink with supervision.   Pt stands on compliant foam while engaging in peg board activity from choice of many with only min cuing for 1 error correction. Pt requires close supervision<>CGA for standing balance with 1UE support, min assist when standing without BUE support 2/2 L LOB.   Provided pt with calendar in room to keep track of date. Reviewed current date at beginning of session with pt able to recall at end of session. Also reviewed d/c date.   Pt left in w/c with chair alarm donned & all needs in reach.  Pt with improved cognition & less irritation on this date.  Therapy Documentation Precautions:  Precautions Precautions: Fall Restrictions Weight Bearing Restrictions: No  Pain: Pt denies c/o pain.   Therapy/Group: Individual Therapy  [name] 03/10/2020, 12:27 PM

## 2020-03-10 NOTE — Progress Notes (Signed)
Occupational Therapy Session Note  Patient Details  Name: Duane Santos MRN: [id number] Date of Birth: 1946/07/28  Today's Date: 03/10/2020 OT Individual Time: 9747-1855 OT Individual Time Calculation (min): 70 min   Short Term Goals: Week 3:  OT Short Term Goal 1 (Week 3): LTG=STG 2/2 ELOS  Skilled Therapeutic Interventions/Progress Updates:    Pt greeted semi-reclined in bed awake and agreeable to OT treatment session. Pt very slow to initiate getting out of bed this morning even though pt was awake. OT had to cue patient many times to get him to come to sitting or to even respond to OT questions as he seemed very internally distracted. OT asked pt why he would not answer her questions and pt stated "I can't understand why I can't hear well this morning. It sounds like there's a phone ringing in my ear." OT eventually able to get pt to sit EOB and he did not report further ringing of the ears throughout session. OT helped pt remove condom catheter, then pt ambulated into bathroom to urinate with RW and CGA + verbal cues for RW management around commode. Bathing/dressing completed from wc at the sink with min verbal cues for sequencing and initiation. Pt perseverative on finding hair brush, although his wife did not bring him one. Had pt work on standing balance and endurance with opening dresser drawers from standing to look for brush. Pt needed CGA for balance when reaching lower drawers and max cues for safety awareness. Pt was able to set-up breakfast, open containers, and manage feeding utensils without OT assistance. Pt left sitting in wc eating breakfast with alarm belt on, call bell in reach, and needs met.    Therapy Documentation Precautions:  Precautions Precautions: Fall Restrictions Weight Bearing Restrictions: No Pain:  denies pain   Therapy/Group: Individual Therapy  Valma Cava 03/10/2020, 8:17 AM

## 2020-03-10 NOTE — Progress Notes (Signed)
[place] PHYSICAL MEDICINE & REHABILITATION PROGRESS NOTE   Subjective/Complaints: Finishing up breakfast. Neck not too tight this morning. No new complaints  ROS: Patient denies fever, rash, sore throat, blurred vision, nausea, vomiting, diarrhea, cough, shortness of breath or chest pain,  headache, or mood change.     Objective:   No results found. No results for input(s): WBC, HGB, HCT, PLT in the last 72 hours. Recent Labs    03/09/20 0500 03/10/20 0500  NA 136 134*  K 4.0 3.6  CL 105 102  CO2 23 23  GLUCOSE 96 94  BUN 21 17  CREATININE 0.86 0.75  CALCIUM 9.0 8.8*    Intake/Output Summary (Last 24 hours) at 03/10/2020 1049 Last data filed at 03/10/2020 0900 Gross per 24 hour  Intake 1311.07 ml  Output 1350 ml  Net -38.93 ml     Physical Exam: Vital Signs Blood pressure 129/73, pulse 72, temperature 98.4 F (36.9 C), temperature source Oral, resp. rate 18, height 5\' 6"  (1.676 m), weight 54.5 kg, SpO2 100 %.  Constitutional: No distress . Vital signs reviewed. HEENT: EOMI, oral membranes moist Neck: supple Cardiovascular: RRR without murmur. No JVD    Respiratory/Chest: CTA Bilaterally without wheezes or rales. Normal effort    GI/Abdomen: BS +, non-tender, non-distended Ext: no clubbing, cyanosis, or edema Psych: pleasant and cooperative Skin: Warm and dry. Buttocks completely clear, foam dressing arm Psych: alert oriented to person, place, mo. Follows commands.  Musc: No edema in extremities.  No tenderness in extremities. Neuro: sensory exam intact.  Motor: 4-4+/5 throughout, unchanged  Assessment/Plan: 1. 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  Physiatrist is providing close team supervision and 24 hour management of active medical problems listed below.  Physiatrist and rehab team  continue to assess barriers to discharge/monitor patient progress toward functional and medical goals  Care Tool:  Bathing    Body parts bathed by patient: Right arm, Left arm, Chest, Abdomen, Right upper leg, Left upper leg, Front perineal area, Face, Buttocks, Right lower leg, Left lower leg   Body parts bathed by helper: Left lower leg, Right lower leg, Buttocks     Bathing assist Assist Level: Contact Guard/Touching assist     Upper Body Dressing/Undressing Upper body dressing   What is the patient wearing?: Pull over shirt    Upper body assist Assist Level: Supervision/Verbal cueing    Lower Body Dressing/Undressing Lower body dressing      What is the patient wearing?: Pants     Lower body assist Assist for lower body dressing: Contact Guard/Touching assist     Toileting Toileting    Toileting assist Assist for toileting: Contact Guard/Touching assist     Transfers Chair/bed transfer  Transfers assist  Chair/bed transfer activity did not occur: Safety/medical concerns  Chair/bed transfer assist level: Contact Guard/Touching assist     Locomotion Ambulation   Ambulation assist      Assist level: Contact Guard/Touching assist Assistive device: Walker-rolling Max distance: 190   Walk 10 feet activity   Assist  Walk 10 feet activity did not occur: Safety/medical concerns  Assist level: Contact Guard/Touching assist Assistive device: Walker-rolling   Walk 50 feet activity   Assist Walk 50 feet with 2 turns activity did not occur: Safety/medical concerns  Assist level: Contact Guard/Touching assist Assistive device: Walker-rolling    Walk 150 feet activity  Assist Walk 150 feet activity did not occur: Safety/medical concerns  Assist level: Contact Guard/Touching assist Assistive device: Walker-rolling    Walk 10 feet on uneven surface  activity   Assist Walk 10 feet on uneven surfaces activity did not occur: Safety/medical  concerns         Wheelchair     Assist Will patient use wheelchair at discharge?: No(per PT eval)      Wheelchair assist level: Minimal Assistance - Patient > 75% Max wheelchair distance: 100    Wheelchair 50 feet with 2 turns activity    Assist        Assist Level: Minimal Assistance - Patient > 75%   Wheelchair 150 feet activity     Assist      Assist Level: Moderate Assistance - Patient 50 - 74%   Blood pressure 129/73, pulse 72, temperature 98.4 F (36.9 C), temperature source Oral, resp. rate 18, height 5\' 6"  (1.676 m), weight 54.5 kg, SpO2 100 %.  Medical Problem List and Plan: 1.  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 DVT/PE/ Antithrombotics:  -DVT/anticoagulation:  converted back to Eliquis   -will need to go back to iv heparin prior to repeat LP             -antiplatelet therapy: N/a 3. Pain Management: add sports rub for right SCM tenderness  -kpad  Controlled on 4/2 4. Mood: LCSW to monitor for evaluation and support when appropriate.              -antipsychotic agents: N/A 5. Neuropsych: This patient is not capable of making decisions on his own behalf.  Telesitter for safety 6. Skin/Wound Care: routine pressure relief measures.   Condom catheter nightly to prevent MASD given IVF  -backside continues to be clear 7. Fluids/Electrolytes/Nutrition: Monitor I/Os  -severe protein deficient malnutrition  -dietary supplements, RD following 8. Disseminated cryptococcal infection/meningoencephalitis: On amphotericin B daily and Flucytosine every 6 hours--  daily BMP, Mg and K+ as well as periodic LFTs.     3/22: Repeat LP 3/21: glucose 27, protein 145, wbc 104, CSF culture no growth, fungal cultures no  growth to date  -repeat LP prior to discharge per ID  -plan for 28 days of iv amphotericin  3/30 spoke to Dr. Megan Salon . Can transition to oral fluconazole on 4/7. He will not order another LP  -pharmacy following labs/Cr closely 9. Hyponatremia:   Sodium 134 4/2 10. Myasthenia gravis: Has been off Cellcept since Covid. Had been on prednisone long term prior to cellcept. Would not recommend tapering all the way off prednisone at this time. Continue 5mg  daily   - WIFE requests that pt's Neurologist- a national expert on Myasthenia gravis called with any med changes- Dr Westley Foots- [phone]  11. Post Covid hypoxia: Has resolved--current O2 sats on room ar 99%- continue to monitor.  Breathing comfortably.  12. Hypokalemia: Due to meds  Continue supplementation  Potassium 3.6 4/2  Mag 1.9 4/2 13. Stage I pressure ulcer and skin tears-  Improving see above 14. Leukocytosis: Resolved    Likely steroid related  -afebrile, abx as above 15.  Macrocytic anemia  Hemoglobin 9.7 3/29    16.  Urinary incontinence-likely secondary to cognition and amount of IVF  Condom cath  at night, discussed with wife on Monday  -remains intermittently incontinent without much change      LOS: 15 days A FACE TO [place] 03/10/2020, 10:49 AM

## 2020-03-10 NOTE — Progress Notes (Signed)
Speech Language Pathology Daily Session Note  Patient Details  Name: Duane Santos MRN: [id number] Date of Birth: 01-07-1946  Today's Date: 03/10/2020 SLP Individual Time: 1100-1155 SLP Individual Time Calculation (min): 55 min  Short Term Goals: Week 3: SLP Short Term Goal 1 (Week 3): STGs=LTGs due ELOS  Skilled Therapeutic Interventions: Skilled treatment session focused on cognitive goals. SLP facilitated session by administering the MoCA-Version 7.2 to assess progress and overall cognitive functioning since patient was unable to participate in formal testing due to level of impairments on evaluation. Patient required more than a reasonable amount of time for processing throughout task with repetition needed intermittently due to hearing deficits. Patient scored 16/30 points with a score of 26 or above considered normal. Patient demonstrates deficits in attention, executive functioning, and short-term recall. Patient educated on results and verbalized understanding. Patient left upright in wheelchair with alarm on and all needs within reach. Continue with current plan of care.      Pain No/Denies Pain   Therapy/Group: Individual Therapy  [name] 03/10/2020, 2:09 PM

## 2020-03-10 NOTE — Progress Notes (Addendum)
Social Work Patient ID: [name], male   DOB: 08-09-1946, 74 y.o.   MRN: [id number]   SW left message for pt wife Maudie Mercury [phone]) to discuss family education suggested for Tuesday, as well as, accompanying pt at his therapies on Monday if possible. SW informed on recommended DME: transport w/c and RW. SW informed will leave list of HHA in room for her to review, and inform SW on preferred HHA.  SW left HHA list in room. SW ordered DME: RW and transport w/c through [employer] via parachute.  *SW met with pt wife to discuss above. HHA preference is [facility]. SW to follow-up.    Loralee Pacas, MSW, [place] Office: [phone] Cell: [phone] Fax: [phone]

## 2020-03-10 NOTE — Progress Notes (Signed)
Pharmacy Antibiotic Note  Duane Santos is a 74 y.o. male admitted on 02/24/2020 with meningitis. 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   Assessment: 62 YOM with myasthenia gravis on prednisone/mycophenolate. Noted to have positive cryptococcus Ag and high titer. Started on amphotericin - liposomal and flucytosine evening on 3/11. He had LP and BAL performed 3/12 - CSF cx with yeast. Transferred to inpatient rehab on 3/18. 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   Scr today is down to 0.75 today. Would continue pre-dose 500 mL bolus and post-dose 1000 mL bolus fluids. Magnesium increased to 1.9 today after 2 gm replacement yesterday. Potassium is down to 3.6 today after 60 mEq supplementation yesterday.   Plan: Continue amphotericin B 200 mg IV daily  Continue normal saline 500 mL pre-dose and 1000 mL post-dose for renal protection  Continue potassium 60 mEQ PO daily (split over two doses) Extra 40 mEQ potassium today Mg sulfate 2 gm IV x1 today   Continue to trend Scr and if elevated >1, consider increasing pre-dose NS bolus to 750 mL.  Monitor renal function, electrolytes, clinical improvement   Height: _0  (167.6 cm) Weight: 54.5 kg (120 lb 2.4 oz) IBW/kg (Calculated) : 63.8  Temp (24hrs), Avg:98.1 F (36.7 C), Min:97.8 F (36.6 C), Max:98.4 F (36.9 C)  Recent Labs  Lab 03/06/20 0445 03/07/20 0420 03/08/20 0339 03/09/20 0500 03/10/20 0500  WBC 5.0  --   --   --   --   CREATININE 0.89 0.78 0.95 0.86 0.75    Estimated Creatinine Clearance: 64.3 mL/min (by C-G formula based on SCr of 0.75 mg/dL).    No Known Allergies  Antimicrobials this admission: Ambisome 3/11>> Flucytosine 3/11>> Cefepime/Vancomycin 3/6>3/8 Zosyn 3/8 >> 3/15  Thank you for allowing pharmacy to be a part of this patient's care.  Jimmy Footman, PharmD, BCPS, BCIDP Infectious Diseases Clinical Pharmacist Phone:  [phone] 03/10/2020 12:30 PM

## 2020-03-11 ENCOUNTER — Inpatient Hospital Stay (HOSPITAL_COMMUNITY): Payer: Medicare HMO

## 2020-03-11 LAB — MAGNESIUM: Magnesium: 2 mg/dL (ref 1.7–2.4)

## 2020-03-11 LAB — BASIC METABOLIC PANEL
Anion gap: 9 (ref 5–15)
BUN: 19 mg/dL (ref 8–23)
CO2: 24 mmol/L (ref 22–32)
Calcium: 9.2 mg/dL (ref 8.9–10.3)
Chloride: 104 mmol/L (ref 98–111)
Creatinine, Ser: 0.74 mg/dL (ref 0.61–1.24)
Glucose, Bld: 104 mg/dL — ABNORMAL HIGH (ref 70–99)
Potassium: 4.3 mmol/L (ref 3.5–5.1)
Sodium: 137 mmol/L (ref 135–145)

## 2020-03-11 NOTE — Progress Notes (Signed)
Occupational Therapy Session Note  Patient Details  Name: Duane Santos MRN: [id number] Date of Birth: 24-Feb-1946  Today's Date: 03/11/2020 OT Individual Time: 1345-1430 OT Individual Time Calculation (min): 45 min  and Today's Date: 03/11/2020 OT Missed Time: 15 Minutes Missed Time Reason: Patient unwilling/refused to participate without medical reason(pt refused to stop brushing teeth/flossing despite being at sink for 15 min)   Short Term Goals: Week 1:  OT Short Term Goal 1 (Week 1): Pt will complete toilet transfer with min A OT Short Term Goal 1 - Progress (Week 1): Met OT Short Term Goal 2 (Week 1): Pt will complete LB dressing with mod A OT Short Term Goal 2 - Progress (Week 1): Met OT Short Term Goal 3 (Week 1): Pt will self-feed 50% of meal with min A. OT Short Term Goal 3 - Progress (Week 1): Met  Skilled Therapeutic Interventions/Progress Updates:    1:1. Pt received in bed agreeable to OT. Pt declines bathing this date but bed sheets soaked. Pt completes stand pivot transfer to w/c from EOB with CGA fading to S with VC for hand placement. Pt completes peri bathing in standing with S and dons new brief and pants with S overall. Pt completes all grooming at sink with S, significantly increased time for termination and MAX VC for termination this date. OT states after 8 min of brushing teeth and pt reapplies toothpaste, "you've been brushing a while and we are spending a lot of therapy time on something that can be done later." Pt states, " well this is valuable therapy time." Pt would benefit from timer to improve awareness to passage of time and increase termination. Pt reporting pain in neck and requesting bengay cream. RN approves. Again when asking pt to terminate flossing pt becomes nasty to OT and tells OT to leave so he can do his "gum therapy." OT set up exit alarm on and call light in w/c and updates RN to positioning.  Therapy Documentation Precautions:   Precautions Precautions: Fall Restrictions Weight Bearing Restrictions: No General:   Vital Signs:   Pain:   ADL: ADL Eating: Maximal assistance Grooming: Moderate assistance Upper Body Bathing: Moderate assistance Lower Body Bathing: Maximal assistance Upper Body Dressing: Moderate assistance Lower Body Dressing: Maximal assistance Toileting: Maximal assistance Toilet Transfer: Moderate verbal cueing Vision   Perception    Praxis   Exercises:   Other Treatments:     Therapy/Group: Individual Therapy  Tonny Branch 03/11/2020, 2:03 PM

## 2020-03-11 NOTE — Progress Notes (Addendum)
Pharmacy Antibiotic Note  Duane Santos is a 74 y.o. male admitted on 02/24/2020 with meningitis. 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   Assessment: 35 YOM with myasthenia gravis on prednisone/mycophenolate. Noted to have positive cryptococcus Ag and high titer. Started on amphotericin - liposomal and flucytosine evening on 3/11. He had LP and BAL performed 3/12 - CSF cx with yeast. Transferred to inpatient rehab on 3/18. 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   Scr today is down to 0.74. Recommend continuing pre-dose 500 mL bolus and post-dose 1000 mL bolus fluids. Magnesium increased to 2 after receiving 2 gm replacement on 4/2. Potassium is up to 4.3 today after 100 mEq supplementation on 4/2. Electrolytes wnl today.   Plan: Continue amphotericin B 200 mg IV daily  Continue normal saline 500 mL pre-dose and 1000 mL post-dose for renal protection  Continue potassium 60 mEQ PO daily (split over two doses) Continue to trend Scr and if elevated >1, consider increasing pre-dose NS bolus to 750 mL.  Monitor renal function, electrolytes, clinical improvement   Height: _0  (167.6 cm) Weight: 54.5 kg (120 lb 2.4 oz) IBW/kg (Calculated) : 63.8  Temp (24hrs), Avg:98 F (36.7 C), Min:97.5 F (36.4 C), Max:98.6 F (37 C)  Recent Labs  Lab 03/06/20 0445 03/06/20 0445 03/07/20 0420 03/08/20 0339 03/09/20 0500 03/10/20 0500 03/11/20 0438  WBC 5.0  --   --   --   --   --   --   CREATININE 0.89   < > 0.78 0.95 0.86 0.75 0.74   < > = values in this interval not displayed.    Estimated Creatinine Clearance: 64.3 mL/min (by C-G formula based on SCr of 0.74 mg/dL).    No Known Allergies  Antimicrobials this admission: Ambisome 3/11>> Flucytosine 3/11>> Cefepime/Vancomycin 3/6>3/8 Zosyn 3/8 >> 3/15  Thank you for allowing pharmacy to be a part of this patient's care.  Lorel Monaco, PharmD PGY1 Ambulatory Care  Resident [employer] # [phone]

## 2020-03-11 NOTE — Progress Notes (Signed)
This am NT reported pt to be agitated and refusing to get out of bed, removed clothing, bed soiled and refused assistance as well as brief. While with therapy pt became agitated and spent session as the sink attempting to complete one task, nurse notified that pt still at sink. Bed has been soiled several times due to incontinence.

## 2020-03-12 ENCOUNTER — Inpatient Hospital Stay (HOSPITAL_COMMUNITY): Payer: Medicare HMO | Admitting: Speech Pathology

## 2020-03-12 LAB — BASIC METABOLIC PANEL
Anion gap: 9 (ref 5–15)
BUN: 19 mg/dL (ref 8–23)
CO2: 23 mmol/L (ref 22–32)
Calcium: 8.9 mg/dL (ref 8.9–10.3)
Chloride: 103 mmol/L (ref 98–111)
Creatinine, Ser: 0.69 mg/dL (ref 0.61–1.24)
GFR calc Af Amer: >60 mL/min (ref >60)
GFR calc non Af Amer: >60 mL/min (ref >60)
Glucose, Bld: 104 mg/dL — ABNORMAL HIGH (ref 70–99)
Potassium: 3.9 mmol/L (ref 3.5–5.1)
Sodium: 135 mmol/L (ref 135–145)

## 2020-03-12 LAB — MAGNESIUM: Magnesium: 1.8 mg/dL (ref 1.7–2.4)

## 2020-03-12 MED ORDER — MAGNESIUM SULFATE 50 % IJ SOLN
1.0000 g | Freq: Once | INTRAVENOUS | Status: DC
  Filled 2020-03-12: qty 2

## 2020-03-12 MED ORDER — MAGNESIUM SULFATE 2 GM/50ML IV SOLN
2.0000 g | Freq: Once | INTRAVENOUS | Status: AC
  Administered 2020-03-12: 10:00:00 2 g via INTRAVENOUS
  Filled 2020-03-12: qty 50

## 2020-03-12 NOTE — Progress Notes (Signed)
Spoke with pt's wife at beginning of shift. Wife has concerns with immobility when pt refuses to participate. Wife would like pt to ambulate in room with walker instead of W/C so pt could still get some mobility interaction. Writer referred wife to PT with her concerns.

## 2020-03-12 NOTE — Progress Notes (Signed)
Speech Language Pathology Daily Session Note  Patient Details  Name: Duane Santos MRN: [id number] Date of Birth: [birth year]  Today's Date: 03/12/2020 SLP Individual Time: 1310-1348 SLP Individual Time Calculation (min): 38 min  Short Term Goals: Week 3: SLP Short Term Goal 1 (Week 3): STGs=LTGs due ELOS  Skilled Therapeutic Interventions:  Pt was seen for skilled ST targeting cognitive goals.  Pt was sitting up in bed with lunch tray in front of him, untouched.  Pt reported that he needed to "wake up" before eating.  Pt needed max assist verbal and visual cues to initiate self feeding after 30 minutes of not eating anything from his tray.  Pt also needed max assist verbal and visual cues to utilize external aids around his room to reorient to place and date.  Even after going through orientation information, pt still had evidence of confusion, asking therapist "So, where do you work?"  Pt had increased response latency and often required 2-3 repetitions of questions before answering.  Pt was left in bed with bed alarm set and call bell within reach.  Continue per current plan of care.    Pain Pain Assessment Pain Scale: 0-10 Pain Score: 0-No pain  Therapy/Group: Individual Therapy  Lilianne Delair, Selinda Orion 03/12/2020, 1:49 PM

## 2020-03-12 NOTE — Progress Notes (Signed)
[place] PHYSICAL MEDICINE & REHABILITATION PROGRESS NOTE   Subjective/Complaints: No new issues. Slept well.  Made comments about "being dead to Rn and myself"  ROS: Limited due to cognitive/behavioral     Objective:   No results found. No results for input(s): WBC, HGB, HCT, PLT in the last 72 hours. Recent Labs    03/11/20 0438 03/12/20 0405  NA 137 135  K 4.3 3.9  CL 104 103  CO2 24 23  GLUCOSE 104* 104*  BUN 19 19  CREATININE 0.74 0.69  CALCIUM 9.2 8.9    Intake/Output Summary (Last 24 hours) at 03/12/2020 1024 Last data filed at 03/12/2020 0924 Gross per 24 hour  Intake 120 ml  Output 3400 ml  Net -3280 ml     Physical Exam: Vital Signs Blood pressure (!) 133/102, pulse 81, temperature 97.9 F (36.6 C), temperature source Oral, resp. rate 16, height 5\' 6"  (1.676 m), weight 54.5 kg, SpO2 100 %.  Constitutional: No distress . Vital signs reviewed. HEENT: EOMI, oral membranes moist Neck: supple Cardiovascular: RRR without murmur. No JVD    Respiratory/Chest: CTA Bilaterally without wheezes or rales. Normal effort    GI/Abdomen: BS +, non-tender, non-distended Ext: no clubbing, cyanosis, or edema Psych: pleasant, does not appear depressed. Is confused still Skin: Warm and dry. Buttocks completely clear, foam dressing arm Psych: alert oriented to person, place only. Impaired insight and awareness. Follows commands.  Musc: No edema in extremities.  No tenderness in extremities. Neuro: sensory exam intact.  Motor: 4-4+/5 throughout, unchanged  Assessment/Plan: 1. 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  Physiatrist is providing close team supervision and 24 hour management of active medical problems listed below.  Physiatrist and rehab team continue to assess barriers to discharge/monitor  patient progress toward functional and medical goals  Care Tool:  Bathing    Body parts bathed by patient: Right arm, Left arm, Chest, Abdomen, Right upper leg, Left upper leg, Front perineal area, Face, Buttocks, Right lower leg, Left lower leg   Body parts bathed by helper: Left lower leg, Right lower leg, Buttocks     Bathing assist Assist Level: Contact Guard/Touching assist     Upper Body Dressing/Undressing Upper body dressing   What is the patient wearing?: Pull over shirt    Upper body assist Assist Level: Supervision/Verbal cueing    Lower Body Dressing/Undressing Lower body dressing      What is the patient wearing?: Pants     Lower body assist Assist for lower body dressing: Contact Guard/Touching assist     Toileting Toileting    Toileting assist Assist for toileting: Contact Guard/Touching assist     Transfers Chair/bed transfer  Transfers assist  Chair/bed transfer activity did not occur: Safety/medical concerns  Chair/bed transfer assist level: Contact Guard/Touching assist     Locomotion Ambulation   Ambulation assist      Assist level: Supervision/Verbal cueing Assistive device: Walker-rolling Max distance: 300 ft   Walk 10 feet activity   Assist  Walk 10 feet activity did not occur: Safety/medical concerns  Assist level: Supervision/Verbal cueing Assistive device: Walker-rolling   Walk 50 feet activity   Assist Walk 50 feet with 2 turns activity did not occur: Safety/medical concerns  Assist level: Supervision/Verbal cueing Assistive device: Walker-rolling    Walk 150 feet activity   Assist Walk 150 feet activity did not  occur: Safety/medical concerns  Assist level: Supervision/Verbal cueing Assistive device: Walker-rolling    Walk 10 feet on uneven surface  activity   Assist Walk 10 feet on uneven surfaces activity did not occur: Safety/medical concerns         Wheelchair     Assist Will patient use  wheelchair at discharge?: No(per PT eval)      Wheelchair assist level: Minimal Assistance - Patient > 75% Max wheelchair distance: 100    Wheelchair 50 feet with 2 turns activity    Assist        Assist Level: Minimal Assistance - Patient > 75%   Wheelchair 150 feet activity     Assist      Assist Level: Moderate Assistance - Patient 50 - 74%   Blood pressure (!) 133/102, pulse 81, temperature 97.9 F (36.6 C), temperature source Oral, resp. rate 16, height 5\' 6"  (1.676 m), weight 54.5 kg, SpO2 100 %.  Medical Problem List and Plan: 1.  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 DVT/PE/ Antithrombotics:  -DVT/anticoagulation:  converted back to Eliquis   -will need to go back to iv heparin prior to repeat LP             -antiplatelet therapy: N/a 3. Pain Management: add sports rub for right SCM tenderness  -kpad  Controlled on 4/2 4. Mood: LCSW to monitor for evaluation and support when appropriate.              -antipsychotic agents: N/A  -suspect mentions of "death" are as a result of his confusion. Does not appear distressed and was rather pleasant this morning 5. Neuropsych: This patient is not capable of making decisions on his own behalf.  Telesitter for safety 6. Skin/Wound Care: routine pressure relief measures.   Condom catheter nightly to prevent MASD given IVF  -backside continues to be clear 7. Fluids/Electrolytes/Nutrition: Monitor I/Os  -severe protein deficient malnutrition  -dietary supplements, RD following 8. Disseminated cryptococcal infection/meningoencephalitis: On amphotericin B daily and Flucytosine every 6 hours--  daily BMP, Mg and K+ as well as periodic LFTs.     3/22: Repeat LP 3/21: glucose 27,  protein 145, wbc 104, CSF culture no growth, fungal cultures no growth to date  -repeat LP prior to discharge per ID  -plan for 28 days of iv amphotericin  3/30 spoke to Dr. Megan Salon . Can transition to oral fluconazole on 4/7. He will not order another LP  -pharmacy following labs/Cr closely 9. Hyponatremia:   Sodium 137 4/3 10. Myasthenia gravis: Has been off Cellcept since Covid. Had been on prednisone long term prior to cellcept. Would not recommend tapering all the way off prednisone at this time. Continue 5mg  daily   - WIFE requests that pt's Neurologist- a national expert on Myasthenia gravis called with any med changes- Dr Westley Foots- [phone]  11. Post Covid hypoxia: Has resolved--current O2 sats on room ar 99%- continue to monitor.  Breathing comfortably.  12. Hypokalemia: Due to meds  Continue supplementation  Potassium 4.3 4/3  Mag 2.0 4/3 13. Stage I pressure ulcer and skin tears-  Improving see above 14. Leukocytosis: Resolved    Likely steroid related  -afebrile, abx as above 15.  Macrocytic anemia  Hemoglobin 9.7 3/29  16.  Urinary incontinence-likely secondary to cognition and amount of IVF  Condom cath at night, discussed with wife on Monday  -remains intermittently incontinent        LOS: 17 days A FACE TO [place] 03/12/2020, 10:24 AM

## 2020-03-12 NOTE — Progress Notes (Signed)
Pharmacy Antibiotic Note  Duane Santos is a 74 y.o. male admitted on 02/24/2020 with meningitis. 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   Assessment: 67 YOM with myasthenia gravis on prednisone/mycophenolate. Noted to have positive cryptococcus Ag and high titer. Started on amphotericin - liposomal and flucytosine evening on 3/11. He had LP and BAL performed 3/12 - CSF cx with yeast. Transferred to inpatient rehab on 3/18. 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   Scr today is down to 0.69. Recommend continuing pre-dose 500 mL bolus and post-dose 1000 mL bolus fluids. Magnesium down to 1.8 today. Potassium is stable at 3.9 today on scheduled 60 mEq supplementation.   Plan: Continue amphotericin B 200 mg IV daily  Continue normal saline 500 mL pre-dose and 1000 mL post-dose for renal protection  Continue potassium 60 mEQ PO daily (split over two doses) Mg sulfate 2 gm IV x1 today Continue to trend Scr and if elevated >1, consider increasing pre-dose NS bolus to 750 mL.  Monitor renal function, electrolytes, clinical improvement   Height: _0  (167.6 cm) Weight: 54.5 kg (120 lb 2.4 oz) IBW/kg (Calculated) : 63.8  Temp (24hrs), Avg:98.1 F (36.7 C), Min:97.9 F (36.6 C), Max:98.3 F (36.8 C)  Recent Labs  Lab 03/06/20 0445 03/07/20 0420 03/08/20 0339 03/09/20 0500 03/10/20 0500 03/11/20 0438 03/12/20 0405  WBC 5.0  --   --   --   --   --   --   CREATININE 0.89   < > 0.95 0.86 0.75 0.74 0.69   < > = values in this interval not displayed.    Estimated Creatinine Clearance: 64.3 mL/min (by C-G formula based on SCr of 0.69 mg/dL).    No Known Allergies  Antimicrobials this admission: Ambisome 3/11>> Flucytosine 3/11>> Cefepime/Vancomycin 3/6>3/8 Zosyn 3/8 >> 3/15  Thank you for allowing pharmacy to be a part of this patient's care.  Lorel Monaco, PharmD PGY1 Ambulatory Care Resident [employer] #  [phone]

## 2020-03-12 NOTE — Progress Notes (Signed)
Pt requested bathroom, bed and brief completely soiled and required complete bed change.

## 2020-03-12 NOTE — Progress Notes (Signed)
Pt reports " If Im found dead I'd rather be clean and neat/"

## 2020-03-12 NOTE — Progress Notes (Signed)
Pt's Wife called and requested to start using incontinence supplies that she provides to prepare Pt for home.

## 2020-03-12 NOTE — Progress Notes (Signed)
Pt has had a few episodes of being demanding and combative at bedside. Camera operator and NT intervened. Pt is confused mentioning he has a speeding ticket and is attempting to call law enforcement about his court date. Pt is speaking of dying during shift report.

## 2020-03-13 ENCOUNTER — Inpatient Hospital Stay (HOSPITAL_COMMUNITY): Payer: Medicare HMO | Admitting: Speech Pathology

## 2020-03-13 ENCOUNTER — Inpatient Hospital Stay (HOSPITAL_COMMUNITY): Payer: Medicare HMO | Admitting: Physical Therapy

## 2020-03-13 ENCOUNTER — Inpatient Hospital Stay (HOSPITAL_COMMUNITY): Payer: Medicare HMO | Admitting: Occupational Therapy

## 2020-03-13 DIAGNOSIS — G3184 Mild cognitive impairment, so stated: Secondary | ICD-10-CM

## 2020-03-13 LAB — CULTURE, FUNGUS WITHOUT SMEAR

## 2020-03-13 LAB — BASIC METABOLIC PANEL
Anion gap: 9 (ref 5–15)
BUN: 21 mg/dL (ref 8–23)
CO2: 22 mmol/L (ref 22–32)
Calcium: 8.9 mg/dL (ref 8.9–10.3)
Chloride: 105 mmol/L (ref 98–111)
Creatinine, Ser: 0.77 mg/dL (ref 0.61–1.24)
GFR calc Af Amer: >60 mL/min (ref >60)
GFR calc non Af Amer: >60 mL/min (ref >60)
Glucose, Bld: 109 mg/dL — ABNORMAL HIGH (ref 70–99)
Potassium: 3.9 mmol/L (ref 3.5–5.1)
Sodium: 136 mmol/L (ref 135–145)

## 2020-03-13 LAB — MAGNESIUM: Magnesium: 1.9 mg/dL (ref 1.7–2.4)

## 2020-03-13 MED ORDER — POTASSIUM CHLORIDE CRYS ER 20 MEQ PO TBCR
40.0000 meq | EXTENDED_RELEASE_TABLET | Freq: Once | ORAL | Status: AC
  Administered 2020-03-13: 40 meq via ORAL
  Filled 2020-03-13: qty 2

## 2020-03-13 MED ORDER — MAGNESIUM SULFATE 2 GM/50ML IV SOLN
2.0000 g | Freq: Once | INTRAVENOUS | Status: AC
  Administered 2020-03-13: 2 g via INTRAVENOUS
  Filled 2020-03-13: qty 50

## 2020-03-13 MED ORDER — MELATONIN 3 MG PO TABS
3.0000 mg | ORAL_TABLET | Freq: Every day | ORAL | Status: DC
  Administered 2020-03-13 – 2020-03-14 (×2): 3 mg via ORAL
  Filled 2020-03-13 (×2): qty 1

## 2020-03-13 NOTE — Progress Notes (Signed)
Physical Therapy Discharge Summary  Patient Details  Name: Duane Santos MRN: [id number] Date of Birth: 03/03/1946  Today's Date: 03/14/2020   Patient has met 9 of 10 long term goals due to improved activity tolerance, improved balance, improved postural control, increased strength, ability to compensate for deficits and improved coordination.  Patient to discharge at an ambulatory level close supervision with RW for gait, CGA for stairs with B rails.   Patient's care partner was present for caregiver training but was able to participate in minimal hands on training 2/2 pt needing to use bathroom during family ed session; pt's wife would benefit from further hands on training as therapist was only able to verbally educate her on car transfer & stair negotiation.  Reasons goals not met: pt did not meet memory/recall goal 2/2 impaired cognition  Recommendation:  Patient will benefit from ongoing skilled PT services in home health setting to continue to advance safe functional mobility, address ongoing impairments in balance, awareness, cognition, transfers, gait, stair negotiation, strengthening, endurance, and minimize fall risk.  Equipment: Recommending pt have RW & transport w/c  Reasons for discharge: discharge from hospital  Patient/family agrees with progress made and goals achieved: Yes  PT Discharge Precautions/Restrictions Precautions Precautions: Fall Restrictions Weight Bearing Restrictions: No  Vision/Perception  Pt wears glasses for reading only at baseline. No changes in baseline vision. Slight decreased attention to L side of environment during functional tasks.   Cognition Overall Cognitive Status: Impaired/Different from baseline Orientation Level: Oriented to person Memory: Impaired Memory Impairment: Decreased recall of new information;Decreased short term memory Awareness: Impaired Awareness Impairment: Intellectual impairment Problem Solving:  Impaired Problem Solving Impairment: Functional basic;Verbal basic Safety/Judgment: Impaired  Sensation Sensation Light Touch: Not tested Proprioception: Not tested Coordination Gross Motor Movements are Fluid and Coordinated: No Fine Motor Movements are Fluid and Coordinated: Yes  Motor  Motor Motor: Abnormal postural alignment and control Motor - Discharge Observations: generalized deconditioning   Mobility Bed Mobility Bed Mobility: Rolling Right;Supine to Sit;Rolling Left;Sit to Supine Rolling Right: Supervision/verbal cueing Rolling Left: Supervision/Verbal cueing Supine to Sit: Supervision/Verbal cueing Sit to Supine: Supervision/Verbal cueing Transfers Transfers: Sit to Stand;Stand to Sit Sit to Stand: Supervision/Verbal cueing Stand to Sit: Supervision/Verbal cueing Stand Pivot Transfer Details: Verbal cues for technique;Verbal cues for precautions/safety Transfer (Assistive device): Rolling walker   Locomotion  Gait Ambulation: Yes Gait Assistance: Supervision/Verbal cueing Gait Distance (Feet): (>150 ft) Assistive device: Rolling walker Gait Assistance Details: Verbal cues for sequencing;Verbal cues for technique;Verbal cues for precautions/safety Gait Gait: Yes Gait Pattern: Decreased dorsiflexion - left;Decreased hip/knee flexion - left;Decreased stride length(L lateral lean, decreased weight shift R) Gait velocity: decreased Stairs / Additional Locomotion Stairs: Yes Stairs Assistance: Contact Guard/Touching assist Stair Management Technique: Two rails Number of Stairs: 24   Trunk/Postural Assessment  Cervical Assessment Cervical Assessment: Exceptions to WFL(rounded shoulders) Lumbar Assessment Lumbar Assessment: Exceptions to WFL(posterior pelvic tilt) Postural Control Postural Control: Deficits on evaluation Righting Reactions: delayed Protective Responses: delayed   Balance Balance Balance Assessed: Yes Standardized Balance  Assessment Standardized Balance Assessment: Timed Up and Go Test Timed Up and Go Test TUG: Normal TUG Normal TUG (seconds): 28.7(with RW)   Extremity Assessment  RUE Assessment RUE Assessment: Within Functional Limits LUE Assessment LUE Assessment: Within Functional Limits RLE Assessment RLE Assessment: Within Functional Limits(not formally tested) LLE Assessment LLE Assessment: Within Functional Limits(not formally tested)    [name] 03/14/2020, 3:36 PM

## 2020-03-13 NOTE — Progress Notes (Signed)
Patient ID: Duane Santos, male   DOB: 09/02/1946, 74 y.o.   MRN: [id number]         [facility]  Date of Admission:  02/24/2020           Day 26 amphotericin and flucytosine ASSESSMENT: He has had a good microbiologic response to treatment for cryptococcal meningitis but remains cognitively impaired.  His therapist said that he has appropriate strength and coordination but has to be redirected and instructed frequently because of his cognitive impairment.  PLAN: 1. Continue amphotericin and flucytosine 2 more days before switching to high-dose oral fluconazole  Principal Problem:   Cryptococcal meningitis (HCC) Active Problems:   Myasthenia gravis ([place])   Right lower lobe lung mass   History of COVID-19 pneumonia with hypoxia   Encephalitis   Stage I pressure ulcer of sacral region   Protein-calorie malnutrition, severe   Hypokalemia   Macrocytic anemia   Transaminitis   Nocturnal enuresis   Scheduled Meds: . apixaban  5 mg Oral BID  . butamben-tetracaine-benzocaine  1 spray Topical Once  . Chlorhexidine Gluconate Cloth  6 each Topical BID  . dextrose  10 mL Intravenous Q24H  . dextrose  10 mL Intravenous Q24H  . feeding supplement  1 Container Oral TID BM  . feeding supplement (PRO-STAT SUGAR FREE 64)  30 mL Oral BID  . flucytosine  25 mg/kg Oral Q6H  . melatonin  3 mg Oral QHS  . multivitamin with minerals  1 tablet Oral Daily  . potassium chloride  20 mEq Oral Daily  . potassium chloride  40 mEq Oral Daily  . predniSONE  5 mg Oral Q breakfast  . sodium chloride  1,000 mL Intravenous Q24H  . sodium chloride  500 mL Intravenous Q24H  . sodium chloride flush  10-40 mL Intracatheter Q12H  . sodium chloride  1 g Oral TID WC  . thiamine  100 mg Oral Daily   Continuous Infusions: . sodium chloride Stopped (03/10/20 1430)  . amphotericin  B  Liposome (AMBISOME) ADULT IV 200 mg (03/12/20 2159)   PRN Meds:.sodium chloride, acetaminophen,  alum & mag hydroxide-simeth, bisacodyl, diphenhydrAMINE, diphenhydrAMINE **OR** diphenhydrAMINE, guaiFENesin-dextromethorphan, meperidine (DEMEROL) injection, metoprolol tartrate, Muscle Rub, ondansetron **OR** ondansetron (ZOFRAN) IV, phenylephrine, polyethylene glycol, prochlorperazine **OR** prochlorperazine **OR** prochlorperazine, senna-docusate, sodium chloride flush, sodium phosphate, traZODone   SUBJECTIVE: He cannot recall what he is being treated for.  Review of Systems: Review of Systems  Unable to perform ROS: Mental acuity    No Known Allergies  OBJECTIVE: Vitals:   03/12/20 0616 03/12/20 1441 03/12/20 1917 03/13/20 0310  BP: (!) 133/102 114/73 126/74 (!) 141/80  Pulse: 81 91 98 82  Resp: 16 17 18 18   Temp: 97.9 F (36.6 C) 97.9 F (36.6 C) 98.2 F (36.8 C) 99 F (37.2 C)  TempSrc: Oral     SpO2: 100% 100% 100% 100%  Weight:      Height:       Body mass index is 19.39 kg/m.  Physical Exam Constitutional:      Comments: He is sitting up in his wheelchair working with his physical therapist.  Musculoskeletal:     Cervical back: Neck supple.     Lab Results Lab Results  Component Value Date   WBC 5.0 03/06/2020   HGB 9.7 (L) 03/06/2020   HCT 29.1 (L) 03/06/2020   MCV 99.7 03/06/2020   PLT 215 03/06/2020    Lab Results  Component Value Date  CREATININE 0.77 03/13/2020   BUN 21 03/13/2020   NA 136 03/13/2020   K 3.9 03/13/2020   CL 105 03/13/2020   CO2 22 03/13/2020    Lab Results  Component Value Date   ALT 151 (H) 03/06/2020   AST 78 (H) 03/06/2020   ALKPHOS 86 03/06/2020   BILITOT 0.7 03/06/2020     Microbiology: No results found for this or any previous visit (from the past 240 hour(s)).  Michel Bickers, MD [facility] [place] [facility] [phone] pager   [phone] cell 03/13/2020, 12:19 PM

## 2020-03-13 NOTE — Progress Notes (Signed)
Pharmacy Antibiotic Note  Duane Santos is a 74 y.o. male admitted on 02/24/2020 with meningitis. Pharmacy has been consulted for Ambisome and flucytosine (D#26) dosing for cryptococcal meningitis.   Assessment: 65 YOM with myasthenia gravis on prednisone/mycophenolate. Noted to have positive cryptococcus Ag and high titer. Started on amphotericin - liposomal and flucytosine evening on 3/11. He had LP and BAL performed 3/12 - CSF cx with yeast. Transferred to inpatient rehab on 3/18. 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   Scr slightly increased but stable at to 0.77. Recommend continuing pre-dose 500 mL bolus and post-dose 1000 mL bolus fluids. Magnesium slightly low at 1.9 today. Potassium is stable at 3.9 today on scheduled 60 mEq supplementation.   Plan: Continue amphotericin B 200 mg IV daily  Continue normal saline 500 mL pre-dose and 1000 mL post-dose for renal protection  Give potassium 80 mEQ PO today (split over three doses) Mg sulfate 2 gm IV x1 today Continue to trend Scr and if elevated >1, consider increasing pre-dose NS bolus to 750 mL.  Monitor renal function, electrolytes, clinical improvement   Height: _0  (167.6 cm) Weight: 54.5 kg (120 lb 2.4 oz) IBW/kg (Calculated) : 63.8  Temp (24hrs), Avg:98.4 F (36.9 C), Min:97.9 F (36.6 C), Max:99 F (37.2 C)  Recent Labs  Lab 03/09/20 0500 03/10/20 0500 03/11/20 0438 03/12/20 0405 03/13/20 0314  CREATININE 0.86 0.75 0.74 0.69 0.77    Estimated Creatinine Clearance: 64.3 mL/min (by C-G formula based on SCr of 0.77 mg/dL).    No Known Allergies  Antimicrobials this admission: Ambisome 3/11>> Flucytosine 3/11>> Cefepime/Vancomycin 3/6>3/8 Zosyn 3/8 >> 3/15  Thank you for allowing pharmacy to be a part of this patient's care.  Sherren Kerns, PharmD PGY1 Acute Care Pharmacy Resident

## 2020-03-13 NOTE — Progress Notes (Signed)
Physical Therapy Session Note  Patient Details  Name: Duane Santos MRN: [id number] Date of Birth: 08-Nov-1946  Today's Date: 03/13/2020 PT Individual Time: 0925-1035 PT Individual Time Calculation (min): 70 min   Short Term Goals: Week 3:  PT Short Term Goal 1 (Week 3): STG = LTG due to estimated d/c date.  Skilled Therapeutic Interventions/Progress Updates:    Patient received in bed, pleasantly confused and willing to participate in PT. Able to complete bed mobility with S and extended time, did need CGA in general with RW as well as Mod-max cues for sequencing/safety due to cognition throughout session. Ambulated into bathroom with RW and min guard, able to manage clothing with min guard-minA for balance for donning/doffing brief and fresh pants. Tolerated gait training multiple distances of 22f with RW and CGA during session with occasional cues for safety with device, was able to find his way back to his room from main PT gym with minimal cuing from therapist today! Worked on nustep for 8 minutes with BLEs only (resistance 2) for improved functional activity tolerance and practice of attendance to task, mod-max cues for appropriate pacing (target of 40spm, he tended to stay between 20-35spm when cues were removed). Otherwise worked on fMedical illustratorvia cross midline reaching while in semi-tandem stance on foam pad, did need Min-ModA to maintain upright and demonstrated almost a festinating gait pattern when ambulating backwards to step off of the foam pad. Left up in his WC with all needs met, seatbelt alarm active this morning.   Therapy Documentation Precautions:  Precautions Precautions: Fall Restrictions Weight Bearing Restrictions: No   Pain: Pain Assessment Pain Scale: 0-10 Pain Score: 0-No pain Faces Pain Scale: No hurt    Therapy/Group: Individual Therapy   KWindell Norfolk DPT, PN1   Supplemental Physical Therapist [facility]   Pager  [phone] Rehab Office [phone]   03/13/2020, 10:53 AM

## 2020-03-13 NOTE — Progress Notes (Signed)
[place] PHYSICAL MEDICINE & REHABILITATION PROGRESS NOTE   Subjective/Complaints: Has some neck pain along right side of neck Had poor sleep last night. Denies constipation. Very pleasant.   ROS: Limited due to cognitive/behavioral     Objective:   No results found. No results for input(s): WBC, HGB, HCT, PLT in the last 72 hours. Recent Labs    03/12/20 0405 03/13/20 0314  NA 135 136  K 3.9 3.9  CL 103 105  CO2 23 22  GLUCOSE 104* 109*  BUN 19 21  CREATININE 0.69 0.77  CALCIUM 8.9 8.9    Intake/Output Summary (Last 24 hours) at 03/13/2020 1241 Last data filed at 03/13/2020 0830 Gross per 24 hour  Intake 320 ml  Output 1727 ml  Net -1407 ml     Physical Exam: Vital Signs Blood pressure (!) 141/80, pulse 82, temperature 99 F (37.2 C), resp. rate 18, height 5\' 6"  (1.676 m), weight 54.5 kg, SpO2 100 %.  Constitutional: No distress . Vital signs reviewed. Sitting up in bed.  HEENT: EOMI, oral membranes moist Neck: supple Cardiovascular: RRR without murmur. No JVD    Respiratory/Chest: CTA Bilaterally without wheezes or rales. Normal effort    GI/Abdomen: BS +, non-tender, non-distended Ext: no clubbing, cyanosis, or edema Psych: pleasant, does not appear depressed. Is confused still Skin: Warm and dry. Buttocks completely clear, foam dressing arm Psych: alert oriented to person, place only. Impaired insight and awareness. Follows commands.  Musc: No edema in extremities.  No tenderness in extremities. Neuro: sensory exam intact.  Motor: 4-4+/5 throughout, unchanged  Assessment/Plan: 1. 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  Physiatrist is providing close team supervision and 24 hour management of active medical problems listed below.  Physiatrist and rehab team continue to assess barriers to  discharge/monitor patient progress toward functional and medical goals  Care Tool:  Bathing    Body parts bathed by patient: Right arm, Left arm, Chest, Abdomen, Right upper leg, Left upper leg, Front perineal area, Face, Buttocks, Right lower leg, Left lower leg   Body parts bathed by helper: Left lower leg, Right lower leg, Buttocks     Bathing assist Assist Level: Supervision/Verbal cueing     Upper Body Dressing/Undressing Upper body dressing   What is the patient wearing?: Pull over shirt    Upper body assist Assist Level: Supervision/Verbal cueing    Lower Body Dressing/Undressing Lower body dressing      What is the patient wearing?: Pants     Lower body assist Assist for lower body dressing: Supervision/Verbal cueing     Toileting Toileting    Toileting assist Assist for toileting: Contact Guard/Touching assist     Transfers Chair/bed transfer  Transfers assist  Chair/bed transfer activity did not occur: Safety/medical concerns  Chair/bed transfer assist level: Contact Guard/Touching assist     Locomotion Ambulation   Ambulation assist      Assist level: Contact Guard/Touching assist Assistive device: Walker-rolling Max distance: 25ft   Walk 10 feet activity   Assist  Walk 10 feet activity did not occur: Safety/medical concerns  Assist level: Supervision/Verbal cueing Assistive device: Walker-rolling   Walk 50 feet activity   Assist Walk 50 feet with 2 turns activity did not occur: Safety/medical concerns  Assist level: Supervision/Verbal cueing Assistive device: Walker-rolling    Walk 150 feet activity   Assist Walk 150  feet activity did not occur: Safety/medical concerns  Assist level: Supervision/Verbal cueing Assistive device: Walker-rolling    Walk 10 feet on uneven surface  activity   Assist Walk 10 feet on uneven surfaces activity did not occur: Safety/medical concerns         Wheelchair     Assist Will  patient use wheelchair at discharge?: No(per PT eval)      Wheelchair assist level: Minimal Assistance - Patient > 75% Max wheelchair distance: 100    Wheelchair 50 feet with 2 turns activity    Assist        Assist Level: Minimal Assistance - Patient > 75%   Wheelchair 150 feet activity     Assist      Assist Level: Moderate Assistance - Patient 50 - 74%   Blood pressure (!) 141/80, pulse 82, temperature 99 F (37.2 C), resp. rate 18, height 5\' 6"  (1.676 m), weight 54.5 kg, SpO2 100 %.  Medical Problem List and Plan: 1.  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 DVT/PE/ Antithrombotics:  -DVT/anticoagulation:  converted back to Eliquis   -will need to go back to iv heparin prior to repeat LP             -antiplatelet therapy: N/a 3. Pain Management: add sports rub for right SCM tenderness  -kpad for right SCM tenderness 4. Mood: LCSW to monitor for evaluation and support when appropriate.              -antipsychotic agents: N/A  -suspect mentions of "death" are as a result of his confusion. Does not appear distressed and was rather pleasant this morning 5. Neuropsych: This patient is not capable of making decisions on his own behalf.  Telesitter for safety 6. Skin/Wound Care: routine pressure relief measures.   Condom catheter nightly to prevent MASD given IVF  -backside continues to be clear 7. Fluids/Electrolytes/Nutrition: Monitor I/Os  -severe protein deficient malnutrition  -dietary supplements, RD following 8. Disseminated cryptococcal infection/meningoencephalitis: On amphotericin B daily and Flucytosine every 6 hours--  daily BMP, Mg and K+ as well as periodic LFTs.     3/22: Repeat LP 3/21: glucose 27, protein  145, wbc 104, CSF culture no growth, fungal cultures no growth to date  -repeat LP prior to discharge per ID  -plan for 28 days of iv amphotericin  3/30 spoke to Dr. Megan Salon . Can transition to oral fluconazole on 4/7. He will not order another LP  -pharmacy following labs/Cr closely 9. Hyponatremia:   Sodium 137 4/3, stable 3/5 10. Myasthenia gravis: Has been off Cellcept since Covid. Had been on prednisone long term prior to cellcept. Would not recommend tapering all the way off prednisone at this time. Continue 5mg  daily   - WIFE requests that pt's Neurologist- a national expert on Myasthenia gravis called with any med changes- Dr Westley Foots- [phone]  11. Post Covid hypoxia: Has resolved--current O2 sats on room ar 99%- continue to monitor.  Breathing comfortably.  12. Hypokalemia: Due to meds  Continue supplementation  Potassium 4.3 4/3, 3.9 4/5 13. Hypomagnesemia  Mag 2.0 4/3, 1.9 on 4/5 14. Stage I pressure ulcer and skin tears-  Improving see above 15. Leukocytosis: Resolved    Likely steroid related  -afebrile, abx as above  16.  Macrocytic anemia  Hemoglobin 9.7 3/29 17.  Urinary incontinence-likely secondary to cognition and amount of IVF  Condom cath at night, discussed with wife on Monday  -remains intermittently incontinent     LOS: 18 days A FACE TO FACE EVALUATION WAS [place] 03/13/2020, 12:41 PM

## 2020-03-13 NOTE — Progress Notes (Signed)
Occupational Therapy Session Note  Patient Details  Name: Duane Santos MRN: [id number] Date of Birth: 16-Apr-1946  Today's Date: 03/13/2020 OT Individual Time: 1103-1201 OT Individual Time Calculation (min): 58 min   Short Term Goals: Week 3:  OT Short Term Goal 1 (Week 3): LTG=STG 2/2 ELOS  Skilled Therapeutic Interventions/Progress Updates:    Pt greeted asleep in wc, easy to wake, and agreeable to OT treatment session. Pt was already dressed and declined bathing/dressing today. Pt agreeable to go to the bathroom with encouragement from OT. Pt needed increased time to initiate sit<>stand from wc with supervision, but verbal cues for hand placement. Pt needed verbal cues for pathfinding to the bathroom and intermittent CGA for RW management as pt tends to push walker away and uses walker unsafely. Pt with very poor safety awareness. Pt stood to urinate and missed the commode initially and unaware, requiring cues from OT to aim towards the commode. Pt had not been incontinent in brief, but his spouse wanted him to start wearing the depends she brought from home. Had pt turn and sit on commode to don depends. Verbal cues to take pants off, then pt able to don depends with increased time and some frustration, but pt eventually able to get them on with supervision. Pt donned shoes with set-up A in wc, the brought down to therapy apartment. Practiced tub shower transfer with tub bench which he was able to do with  Close supervision. Then practiced stepping over tub ledge, as pt's wife stated she did not want a tub bench. Pt needed min A for stepping over ledge with verbal cues for technique and safety. OT then worked on [employer] and problem solving with peg board puzzle. Pt with 2 mistakes on first puzzle (horizontal and vertical), but able to correct with cues. Pt then had more difficulty with diagonal pattern, requiring increased time and mod cues to correct mistakes. Pt returned to room and  left seated in wc with alarm belt on, call bell in reach, and needs met.   Therapy Documentation Precautions:  Precautions Precautions: Fall Restrictions Weight Bearing Restrictions: No Pain: Pain Assessment Pain Scale: 0-10 Pain Score: 0-No pain Faces Pain Scale: No hurt   Therapy/Group: Individual Therapy  Valma Cava 03/13/2020, 11:41 AM

## 2020-03-13 NOTE — Progress Notes (Signed)
Social Work Patient ID: Duane Santos, male   DOB: Aug 03, 1946, 74 y.o.   MRN: [id number]   SW received return phone call from [name] [phone]). SW reviewed d/c recommendations with regard to DME, HHPT/OT/ST/CNA, and family education. Pt preferred [facility] remains [facility], [facility], and [facility]. SW indicated will follow-up once there is more information. She will be in for family education tomorrow,4/6 from 10am-3pm.   SW spoke with Brit[facility] about HHPT/OT/ST/CNA and referral accepted for their [facility] [phone]. SW sent referral.   SW called [name] to inform on above about HHA. She inquired about questions about hte DME items. States a RW is  Not needed as they have one already. SW rescinded item with [facility] via parachute.  Loralee Pacas, MSW, [place] Office: [phone] Cell: [phone] Fax: [phone]

## 2020-03-13 NOTE — Progress Notes (Signed)
Speech Language Pathology Daily Session Note  Patient Details  Name: Duane Santos MRN: [id number] Date of Birth: 04/03/1946  Today's Date: 03/13/2020 SLP Individual Time: 1335-1430 SLP Individual Time Calculation (min): 55 min  Short Term Goals: Week 3: SLP Short Term Goal 1 (Week 3): STGs=LTGs due ELOS  Skilled Therapeutic Interventions: Skilled treatment session focused on cognitive goals. Upon arrival, patient requested to use the bathroom. Patient ambulated to the commode with Min verbal cues for safety but was unable to void despite more than a reasonable amount of time. Patient demonstrated intermittent confusion throughout session and was asking to "check the waiting room" for his wife and frequently referred to the hospital as "jail" with Mod verbal cues needed for reorientation. SLP also facilitated session by providing Min-Mod A verbal cues for problem solving during a novel card task. Patient left upright in wheelchair with alarm on and all needs within reach. Continue with current plan of care.      Pain Pain Assessment Pain Scale: 0-10 Pain Score: 0-No pain Faces Pain Scale: No hurt  Therapy/Group: Individual Therapy  Brande Uncapher 03/13/2020, 2:37 PM

## 2020-03-14 ENCOUNTER — Inpatient Hospital Stay (HOSPITAL_COMMUNITY): Payer: Medicare HMO | Admitting: Physical Therapy

## 2020-03-14 ENCOUNTER — Encounter (HOSPITAL_COMMUNITY): Payer: Medicare HMO | Admitting: Occupational Therapy

## 2020-03-14 ENCOUNTER — Ambulatory Visit (HOSPITAL_COMMUNITY): Payer: Medicare HMO | Admitting: Physical Therapy

## 2020-03-14 ENCOUNTER — Encounter (HOSPITAL_COMMUNITY): Payer: Medicare HMO | Admitting: Speech Pathology

## 2020-03-14 LAB — MAGNESIUM: Magnesium: 2 mg/dL (ref 1.7–2.4)

## 2020-03-14 LAB — FUNGUS CULTURE WITH STAIN

## 2020-03-14 LAB — BASIC METABOLIC PANEL
Anion gap: 6 (ref 5–15)
BUN: 23 mg/dL (ref 8–23)
CO2: 21 mmol/L — ABNORMAL LOW (ref 22–32)
Calcium: 8.9 mg/dL (ref 8.9–10.3)
Chloride: 106 mmol/L (ref 98–111)
Creatinine, Ser: 0.94 mg/dL (ref 0.61–1.24)
GFR calc Af Amer: >60 mL/min (ref >60)
GFR calc non Af Amer: >60 mL/min (ref >60)
Glucose, Bld: 121 mg/dL — ABNORMAL HIGH (ref 70–99)
Potassium: 4.1 mmol/L (ref 3.5–5.1)
Sodium: 133 mmol/L — ABNORMAL LOW (ref 135–145)

## 2020-03-14 LAB — FUNGAL ORGANISM REFLEX

## 2020-03-14 LAB — FUNGUS CULTURE RESULT

## 2020-03-14 MED ORDER — FLUCONAZOLE 100 MG PO TABS: 400.0000 mg | ORAL_TABLET | Freq: Every day | ORAL | Status: DC

## 2020-03-14 NOTE — Progress Notes (Signed)
Speech Language Pathology Discharge Summary  Patient Details  Name: Duane Santos MRN: [id number] Date of Birth: Jul 11, 1946  Today's Date: 03/14/2020 SLP Individual Time: 1300-1340 SLP Individual Time Calculation (min): 40 min   Skilled Therapeutic Interventions:  Skilled treatment session focused on completion of patient and family education with the patient and his wife. SLP educated patient in regards to patient's current cognitive functioning and strategies to utilize at home to maximize attention, recall of functional information and overall safety with functional and familiar tasks. She verbalized understanding of all information and handouts were also given for reinforcement of education. Patient left upright in the wheelchair with the alarm on and all needs within reach. Continue with current plan of care.    Patient has met 4 of 5 long term goals.  Patient to discharge at overall Mod;Max level.   Reasons goals not met: Patient continues to require overall Max A verbal cues for recall of daily information   Clinical Impression/Discharge Summary: Patient has made functional gains and has met 4 of 5 LTGs this admission. Currently, patient requires overall Mod A verbal cues to complete functional and familiar tasks safely in regards to attention, basic problem solving, orientation and intellectual awareness. However, Max A multimodal cues are needed for recall of new information with use of external aids. Patient and family education is complete and patient will discharge home with 24 hour supervision from family. Patient would benefit from f/u home health SLP services to maximize his cognitive functioning and overall functional independence in order to reduce caregiver burden.   Care Partner:  Caregiver Able to Provide Assistance: Yes  Type of Caregiver Assistance: Physical;Cognitive  Recommendation:  Home Health SLP;24 hour supervision/assistance  Rationale for SLP Follow Up: Reduce  caregiver burden;Maximize cognitive function and independence   Equipment: N/A   Reasons for discharge: Discharged from hospital   Patient/Family Agrees with Progress Made and Goals Achieved: Yes    Duane Santos, [name] 03/14/2020, 6:18 AM

## 2020-03-14 NOTE — Progress Notes (Signed)
[place] PHYSICAL MEDICINE & REHABILITATION PROGRESS NOTE   Subjective/Complaints: Has some neck pain along right side of neck Had poor sleep last night. Denies constipation. Very pleasant.   ROS: Limited due to cognitive/behavioral     Objective:   No results found. No results for input(s): WBC, HGB, HCT, PLT in the last 72 hours. Recent Labs    03/13/20 0314 03/14/20 1108  NA 136 133*  K 3.9 4.1  CL 105 106  CO2 22 21*  GLUCOSE 109* 121*  BUN 21 23  CREATININE 0.77 0.94  CALCIUM 8.9 8.9    Intake/Output Summary (Last 24 hours) at 03/14/2020 1252 Last data filed at 03/14/2020 0824 Gross per 24 hour  Intake 1580 ml  Output 1475 ml  Net 105 ml     Physical Exam: Vital Signs Blood pressure (!) 115/55, pulse 77, temperature 98.6 F (37 C), temperature source Oral, resp. rate 19, height 5\' 6"  (1.676 m), weight 54.5 kg, SpO2 99 %.  Constitutional: No distress . Vital signs reviewed. Sitting up in bed.  HEENT: EOMI, oral membranes moist Neck: supple Cardiovascular: RRR without murmur. No JVD    Respiratory/Chest: CTA Bilaterally without wheezes or rales. Normal effort    GI/Abdomen: BS +, non-tender, non-distended Ext: no clubbing, cyanosis, or edema Psych: pleasant, does not appear depressed. Is confused still Skin: Warm and dry. Buttocks completely clear, foam dressing arm Psych: alert oriented to person, place only. Impaired insight and awareness. Follows commands.  Musc: No edema in extremities.  No tenderness in extremities. Neuro: sensory exam intact.  Motor: 4-4+/5 throughout, unchanged  Assessment/Plan: 1. 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  Physiatrist is providing close team supervision and 24 hour management of active medical problems listed below.  Physiatrist and rehab team  continue to assess barriers to discharge/monitor patient progress toward functional and medical goals  Care Tool:  Bathing    Body parts bathed by patient: Right arm, Left arm, Chest, Abdomen, Right upper leg, Left upper leg, Front perineal area, Face, Buttocks, Right lower leg, Left lower leg   Body parts bathed by helper: Left lower leg, Right lower leg, Buttocks     Bathing assist Assist Level: Supervision/Verbal cueing     Upper Body Dressing/Undressing Upper body dressing   What is the patient wearing?: Pull over shirt    Upper body assist Assist Level: Supervision/Verbal cueing    Lower Body Dressing/Undressing Lower body dressing      What is the patient wearing?: Pants     Lower body assist Assist for lower body dressing: Supervision/Verbal cueing     Toileting Toileting    Toileting assist Assist for toileting: Supervision/Verbal cueing     Transfers Chair/bed transfer  Transfers assist  Chair/bed transfer activity did not occur: Safety/medical concerns  Chair/bed transfer assist level: Supervision/Verbal cueing     Locomotion Ambulation   Ambulation assist      Assist level: Supervision/Verbal cueing Assistive device: Walker-rolling Max distance: 245ft   Walk 10 feet activity   Assist  Walk 10 feet activity did not occur: Safety/medical concerns  Assist level: Supervision/Verbal cueing Assistive device: Walker-rolling   Walk 50 feet activity   Assist Walk 50 feet with 2 turns activity did not occur: Safety/medical concerns  Assist level: Supervision/Verbal cueing Assistive device: Walker-rolling    Walk 150 feet activity   Assist Walk 150  feet activity did not occur: Safety/medical concerns  Assist level: Supervision/Verbal cueing Assistive device: Walker-rolling    Walk 10 feet on uneven surface  activity   Assist Walk 10 feet on uneven surfaces activity did not occur: Safety/medical concerns   Assist level: Minimal  Assistance - Patient > 75% Assistive device: Aeronautical engineer Will patient use wheelchair at discharge?: No(functional ambulator)   Wheelchair activity did not occur: N/A  Wheelchair assist level: Minimal Assistance - Patient > 75% Max wheelchair distance: 100    Wheelchair 50 feet with 2 turns activity    Assist    Wheelchair 50 feet with 2 turns activity did not occur: N/A   Assist Level: Minimal Assistance - Patient > 75%   Wheelchair 150 feet activity     Assist  Wheelchair 150 feet activity did not occur: N/A   Assist Level: Moderate Assistance - Patient 50 - 74%   Blood pressure (!) 115/55, pulse 77, temperature 98.6 F (37 C), temperature source Oral, resp. rate 19, height 5\' 6"  (1.676 m), weight 54.5 kg, SpO2 99 %.  Medical Problem List and Plan: 1.  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    -ELOS 4/7     2. DVT/PE/ Antithrombotics:  -DVT/anticoagulation:  converted back to Eliquis   -antiplatelet therapy: N/a 3. Pain Management: add sports rub for right SCM tenderness  -kpad for right SCM tenderness 4. Mood: LCSW to monitor for evaluation and support when appropriate.              -antipsychotic agents: N/A 5. Neuropsych: This patient is not capable of making decisions on his own behalf.  Telesitter for safety 6. Skin/Wound Care: routine pressure relief measures.   Condom catheter nightly to prevent MASD given IVF  -backside continues to be clear 7. Fluids/Electrolytes/Nutrition: Monitor I/Os  -severe protein deficient malnutrition  -dietary supplements, RD following 8. Disseminated cryptococcal infection/meningoencephalitis: On amphotericin B daily and Flucytosine every 6 hours--  daily BMP,  Mg and K+ as well as periodic LFTs.     3/22: Repeat LP 3/21: glucose 27, protein 145, wbc 104, CSF culture no growth, fungal cultures no growth to date  -repeat LP prior to discharge per ID  -plan for 28 days of iv amphotericin  3/30 spoke to Dr. Megan Salon . Can transition to oral fluconazole on 4/7. He will not order another LP. Will need dose tomorrow before discharge  4/6 pt's wife would like for Dr. Vanetta Shawl to see him in [place]   -Ph [phone]  -pharmacy following labs/Cr closely 9. Hyponatremia:   Sodium 133 10. Myasthenia gravis: Has been off Cellcept since Covid. Had been on prednisone long term prior to cellcept. Would not recommend tapering all the way off prednisone at this time. Continue 5mg  daily   - WIFE requests that pt's Neurologist- a national expert on Myasthenia gravis called with any med changes- Dr Westley Foots- [phone]  11. Post Covid hypoxia: Has resolved--current O2 sats on room ar 99%- continue to monitor.  Breathing comfortably.  12. Hypokalemia: Due to meds  Continue supplementation  Potassium 4.1 4/6 13. Hypomagnesemia  Mag 2.0  on 4/6 14. Stage I pressure ulcer and skin tears-  Improving see above 15. Leukocytosis: Resolved    Likely steroid related  -afebrile,  abx as above 16.  Macrocytic anemia  Hemoglobin 9.7 3/29 17.  Urinary incontinence-likely secondary to cognition and amount of IVF  Condom cath at night, discussed with wife. She will use at home as needed as well  -remains intermittently incontinent     LOS: 19 days A FACE TO [place] 03/14/2020, 12:52 PM

## 2020-03-14 NOTE — Progress Notes (Signed)
Upon entering room, noted patient was stirring a cup of coffee. Patient spilled hot coffee with some saturating patient's shirt and abdomen. Provider made aware of redness to abdomen and ordered ice pack intermittently throughout shift. Will continue to monitor patient and apply ice pack intermittently.

## 2020-03-14 NOTE — Progress Notes (Addendum)
Physical Therapy Session Note  Patient Details  Name: Duane Santos MRN: [id number] Date of Birth: 1946-04-01  Today's Date: 03/14/2020 PT Individual Time: 1000-1025 PT Individual Time Calculation (min): 25 min   Short Term Goals: Week 3:  PT Short Term Goal 1 (Week 3): STG = LTG due to estimated d/c date.  Skilled Therapeutic Interventions/Progress Updates:  Pt received in w/c with wife Maudie Mercury) present for caregiver training but reporting pt needs to be changed 2/2 urine incontinence & all clothing wet. Therapist provides cuing for pt to push up off armrests vs pulling on RW for sit<>stand as pt's wife attempts to stabilize RW so he can pull up on AD. Therapist provides pt & wife with gait belt and dons it, providing instructional cuing on how to do so. Pt & wife ambulate into bathroom with RW & wife providing close supervision/CGA. Therapist provides education re: pt requiring 24 hr supervision at d/c, close supervision on pt's L side for all functional mobility, need to use RW for all functional mobility and recommending transport w/c for community mobility. Also discussed HHPT f/u, need to remove/secure throw rugs & remove all tripping hazards to decrease fall risk at home. Kim reports pt will not be able to access B rails to enter home, so therapist verbally reviewed pt negotiating stairs laterally with BUE on rail with wife providing physical assist (hands on hips/gait belt) and reviewing need for caregiver to position herself towards bottom of steps. Also discussed need for pt to sit then place BLE in/out of car vs stepping into car. Maudie Mercury reports she or someone else will assist pt at d/c. Pt on toilet having BM while therapist provides family education and pt requiring extensive time to have BM & perform peri hygiene with Maudie Mercury attempting to assist but pt not finished. Pt left on toilet with wife assisting, in supervision of nursing student.  Pt's wife did not get to practice hands on training  except for sit<>stand and assisting pt with ambulation into the bathroom. Pt & wife would both benefit from further hands on training that was not able to be completed today 2/2 pt needing to toilet during scheduled family ed session.   Therapy Documentation Precautions:  Precautions Precautions: Fall Restrictions Weight Bearing Restrictions: No  Pain: no c/o pain reported   Therapy/Group: Individual Therapy  [name] 03/14/2020, 10:59 AM

## 2020-03-14 NOTE — Progress Notes (Signed)
Physical Therapy Session Note  Patient Details  Name: Duane Santos MRN: [id number] Date of Birth: [birth year]  Today's Date: 03/14/2020 PT Individual Time: 0800-0902 PT Individual Time Calculation (min): 62 min   Short Term Goals: Week 3:  PT Short Term Goal 1 (Week 3): STG = LTG due to estimated d/c date.  Skilled Therapeutic Interventions/Progress Updates:    Patient received in bed, very pleasant and willing to participate with PT this morning. Practiced all aspects of mobility this morning including bed mobility, functional transfers, gait training over steady and unsteady surfaces, stair training, and car and toilet transfers. Able to perform all bed mobility, functional transfers, and gait with RW consistently with supervision and Mod VC for safety/sequencing throughout session, did need min guard for stair navigation with B rails as well as MinA for navigation of uneven surfaces and navigation of single step with RW today mostly due to cognition and safety issues. Tolerated gait distances of 200+ft with RW well today but did become fatigued. Made multiple statements that he is in jail and awaiting his court date and the judge's verdict on if he can go home throughout session- reoriented PRN during session. Left up in [facility] with all needs met, seatbelt alarm active this morning.   Therapy Documentation Precautions:  Precautions Precautions: Fall Restrictions Weight Bearing Restrictions: No   Pain: Pain Assessment Pain Scale: 0-10 Pain Score: 0-No pain Faces Pain Scale: No hurt  Balance: Balance Balance Assessed: Yes Standardized Balance Assessment Standardized Balance Assessment: Timed Up and Go Test Timed Up and Go Test TUG: Normal TUG Normal TUG (seconds): 28.7(with RW)    Therapy/Group: Individual Therapy   Windell Norfolk, DPT, PN1   Supplemental Physical Therapist [place]    Pager [phone] Acute Rehab Office [phone]    03/14/2020, 10:24 AM

## 2020-03-14 NOTE — Patient Care Conference (Signed)
Inpatient RehabilitationTeam Conference and Plan of Care Update Date: 03/14/2020   Time: 10:45 AM    Patient Name: Duane Santos      Medical Record Number: [id number]  Date of Birth: December 11, 1945 Sex: Male         Room/Bed: 4W23C/4W23C-01 Payor Info: Payor: [employer] MEDICARE / Plan: [employer] MEDICARE HMO/PPO / Product Type: *No Product type* /    Admit Date/Time:  02/24/2020  2:47 PM  Primary Diagnosis:  Cryptococcal meningitis [facility])  Patient Active Problem List   Diagnosis Date Noted  . Hypokalemia   . Macrocytic anemia   . Transaminitis   . Nocturnal enuresis   . Stage I pressure ulcer of sacral region 02/25/2020  . Protein-calorie malnutrition, severe 02/25/2020  . Encephalitis 02/24/2020  . Cryptococcal meningitis ([place]) 02/24/2020  . Severe malnutrition ([place]) 02/24/2020  . Acute confusional state 02/11/2020  . Palliative care encounter   . Generalized weakness 02/09/2020  . Hyponatremia 02/09/2020  . History of COVID-19 pneumonia with hypoxia 02/09/2020  . Acute metabolic encephalopathy 26/83/4196  . Chronic respiratory failure with hypoxia ([place]) 02/09/2020  . Right lower lobe lung mass 01/10/2020  . Acute pulmonary embolism without acute cor pulmonale ([place]) 01/10/2020  . Pneumonia due to COVID-19 virus 12/21/2019  . Acute respiratory failure with hypoxia ([place]) 12/21/2019  . Myasthenia gravis ([place]) 12/21/2019  . Normocytic anemia 12/21/2019  . Osteoporosis 12/21/2019  . Multifocal pneumonia 12/20/2019    Expected Discharge Date: Expected Discharge Date: 03/15/20  Team Members Present: Physician leading conference: Dr. Alger Simons Care Coodinator Present: Loralee Pacas, LCSWA;Genie Kindred Heying, RN, MSN Nurse Present: Other (comment)(Kristy Lovena Le, RN) PT Present: Lavone Nian, PT OT Present: Cherylynn Ridges, OT SLP Present: Weston Anna, SLP PPS Coordinator present : Ileana Ladd, PT     Current Status/Progress Goal Weekly Team Focus  Bowel/Bladder   Periods of  incontinence of b/b: LBM 4/5  Regain continence of bowel and bladder with minimal assitance  Assess bowel and bladder needs q shift and PRN and offer toileting q 2 hours while awake   Swallow/Nutrition/ Hydration             ADL's   Supervision and intermitent CGA pending awareness  supervsion  self-care retraining, dc planning, pt/family education, cognitive retraining, activity tolerance   Mobility   gait with RW and as little as supervision but as much as min assist depending on cognition/awareness, min assist stairs with B rails, supervision bed mobility, as little as supervision with transfers  supervision overall with LRAD except CGA stairs with B rails  balance, endurance, gait, cognition, safety, pt/family education, d/c planning   Communication             Safety/Cognition/ Behavioral Observations  Mod-Max A  Mod A  Family Education   Pain   No c/o pain  Remains free of pain  Asses pain q shift and prn   Skin   Skin tear to right arm  Remain free from infection and skin breakdown  Assess skin q shfit and prn    Rehab Goals Patient on target to meet rehab goals: Yes *See Care Plan and progress notes for long and short-term goals.     Barriers to Discharge  Current Status/Progress Possible Resolutions Date Resolved   Nursing                  PT  Decreased caregiver support;Home environment access/layout;Incontinence  unsure if family can provide 24 hr assist at d/c, unsure of home set up  as info taken from chart              OT                  SLP                SW Lack of/limited family support;Decreased caregiver support Wife is working on alternative options for support at home (i.e. respite care/sitter service)            Discharge Planning/Teaching Needs:  D/c to home with wife Maudie Mercury who will provide 24/7 care  Family education as recommended by therapy; Fam edu for 4/6 10am-3pm   Team Discussion: MD cognition up and down, medically ready, IV abx to finish  today.  RN Inc B/B, BM 4/5, skin tear R arm.  OT can be S, family ed today.  PT S/min A, wife here today, fam ed done.  SLP mod a functional tasks, is confused, fam ed later today.   Revisions to Treatment Plan: N/A     Medical Summary Current Status: finishing amphotericin today, no new issues. cognition waxing and waning but slowly improving Weekly Focus/Goal: iv abx, labs  Barriers to Discharge: Medical stability       Continued Need for Acute Rehabilitation Level of Care: The patient requires daily medical management by a physician with specialized training in physical medicine and rehabilitation for the following reasons: Direction of a multidisciplinary physical rehabilitation program to maximize functional independence : Yes Medical management of patient stability for increased activity during participation in an intensive rehabilitation regime.: Yes Analysis of laboratory values and/or radiology reports with any subsequent need for medication adjustment and/or medical intervention. : Yes   I attest that I was present, lead the team conference, and concur with the assessment and plan of the team.   Retta Diones 03/14/2020, 4:27 PM   Team conference was held via web/ teleconference due to [place] - 19

## 2020-03-14 NOTE — Progress Notes (Signed)
Nutrition Follow-up  DOCUMENTATION CODES:   Severe malnutrition in context of acute illness/injury  INTERVENTION:   Continue Boost Breeze po TID, each supplement provides 250 kcal and 9 grams of protein  Continue Magic cup TID with meals, each supplement provides 290 kcal and 9 grams of protein  Continue MVI  Continue 21ml Pro-stat BID   NUTRITION DIAGNOSIS:   Severe Malnutrition related to acute illness(recent covid) as evidenced by percent weight loss, moderate fat depletion, moderate muscle depletion.  Ongoing.  GOAL:   Patient will meet greater than or equal to 90% of their needs  Progressing.  MONITOR:   PO intake, Supplement acceptance, Skin, Labs, Weight trends  REASON FOR ASSESSMENT:   Malnutrition Screening Tool    ASSESSMENT:   Pt with a PMH of Myasthenia gravis, Covid 19 infection 7/37/10 complicated by cor pulmonale/PE/LLE DVT and found to have pulmonary nodules with sclerotic changes in right 3-5th ribs. He was d/c to home 01/03/20 on oxygen, readmitted on 02/09/20 with 2 week hx of progressive confusion and weakness.Pt found to be hyponatremic. MRI brain revealing several small areas of restricted diffusion in bilateral cerebellum. Pt underwent bronchoscopy 3/12 revealing species isolated.  LP showed elevated pressures and yeast noted on gram stain. 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 Therapy ongoing and continues to be debilitated with cognitive deficits and poor safety awareness. Pt admitted to [facility] 3/18.  Discussed pt with RN.   Pt reports appetite is good and that he is enjoying his supplements now.   Noted anticipated discharge of 03/15/20.   PO Intake: 25-75% x last 8 recorded meals (40% average intake)  UOP: 1551ml x24 hours I/O: -11,421.28ml since admit  No updated weight since last RD assessment.   Labs reviewed. Medications reviewed and include: Boost Breeze TID, 72ml Pro-stat BID, MVI,  Klor-Con, Deltasone, Sodium Chloride, Thiamine  Diet Order:   Diet Order            DIET DYS 3 Room service appropriate? Yes; Fluid consistency: Thin  Diet effective now              EDUCATION NEEDS:   Not appropriate for education at this time  Skin:  Skin Assessment: Skin Integrity Issues: Skin Integrity Issues:: Other (Comment), Stage I Stage I: bilateral buttocks Other: non-pressure wound arm  Last BM:  03/13/20  Height:   Ht Readings from Last 1 Encounters:  02/24/20 5\' 6"  (1.676 m)    Weight:   Wt Readings from Last 1 Encounters:  03/07/20 54.5 kg    BMI:  Body mass index is 19.39 kg/m.  Estimated Nutritional Needs:   Kcal:  1600-1800  Protein:  80-90 grams  Fluid:  >1.6L/d   Larkin Ina, MS, RD, LDN RD pager number and weekend/on-call pager number located in [place].

## 2020-03-14 NOTE — Plan of Care (Signed)
  Problem: RH Awareness Goal: LTG: Patient will demonstrate awareness during functional activites type of (OT) Description: LTG: Patient will demonstrate awareness during functional activites type of (OT) Outcome: Not Met (add Reason) Note: Pt still requires mod A for awareness   Problem: RH Balance Goal: LTG: Patient will maintain dynamic sitting balance (OT) Description: LTG:  Patient will maintain dynamic sitting balance with assistance during activities of daily living (OT) Outcome: Completed/Met Goal: LTG Patient will maintain dynamic standing with ADLs (OT) Description: LTG:  Patient will maintain dynamic standing balance with assist during activities of daily living (OT)  Outcome: Completed/Met   Problem: Sit to Stand Goal: LTG:  Patient will perform sit to stand in prep for activites of daily living with assistance level (OT) Description: LTG:  Patient will perform sit to stand in prep for activites of daily living with assistance level (OT) Outcome: Completed/Met   Problem: RH Eating Goal: LTG Patient will perform eating w/assist, cues/equip (OT) Description: LTG: Patient will perform eating with assist, with/without cues using equipment (OT) Outcome: Completed/Met   Problem: RH Grooming Goal: LTG Patient will perform grooming w/assist,cues/equip (OT) Description: LTG: Patient will perform grooming with assist, with/without cues using equipment (OT) Outcome: Completed/Met   Problem: RH Bathing Goal: LTG Patient will bathe all body parts with assist levels (OT) Description: LTG: Patient will bathe all body parts with assist levels (OT) Outcome: Completed/Met   Problem: RH Dressing Goal: LTG Patient will perform upper body dressing (OT) Description: LTG Patient will perform upper body dressing with assist, with/without cues (OT). Outcome: Completed/Met Goal: LTG Patient will perform lower body dressing w/assist (OT) Description: LTG: Patient will perform lower body  dressing with assist, with/without cues in positioning using equipment (OT) Outcome: Completed/Met   Problem: RH Toileting Goal: LTG Patient will perform toileting task (3/3 steps) with assistance level (OT) Description: LTG: Patient will perform toileting task (3/3 steps) with assistance level (OT)  Outcome: Completed/Met   Problem: RH Toilet Transfers Goal: LTG Patient will perform toilet transfers w/assist (OT) Description: LTG: Patient will perform toilet transfers with assist, with/without cues using equipment (OT) Outcome: Completed/Met   Problem: RH Tub/Shower Transfers Goal: LTG Patient will perform tub/shower transfers w/assist (OT) Description: LTG: Patient will perform tub/shower transfers with assist, with/without cues using equipment (OT) Outcome: Completed/Met

## 2020-03-14 NOTE — Plan of Care (Signed)
  Problem: RH Memory Goal: LTG Patient will demonstrate ability for day to day recall/carry over during activities of daily living with assistance level (PT) Description: LTG:  Patient will demonstrate ability for day to day recall/carry over during activities of daily living with assistance level (PT). Outcome: Not met, 2/2 impaired memory/recall

## 2020-03-14 NOTE — Progress Notes (Signed)
Patient ID: EVELYN MOCH, male   DOB: [birth year], 74 y.o.   MRN: [id number]          [facility]    Date of Admission:  02/24/2020   Day amphotericin and flucytosine         Mr. Brandl was in the bathroom when we came by today.  I did have the opportunity to speak with his wife.  She says she has not noted much improvement in his confusion.  He will complete induction therapy for his severe cryptococcal meningitis tomorrow at which time I will switch him to high-dose oral fluconazole.  Those orders have been placed.  I told her that he will need indefinite therapy and close follow-up.  She would like to follow-up with Dr. Vanetta Shawl [phone]) in [place].  I will sign off now.         Michel Bickers, MD [facility] [place] [facility] [phone] pager   [phone] cell 03/14/2020, 12:10 PM

## 2020-03-14 NOTE — Progress Notes (Signed)
Occupational Therapy Discharge Summary  Patient Details  Name: Duane Santos MRN: [id number] Date of Birth: [birth year]  Today's Date: 03/14/2020 OT Individual Time: 1100-1158 OT Individual Time Calculation (min): 58 min   Pt greeted with spouse present and agreeable to OT treatment session. Pt reported need to go to the bathroom again. OT had pt's spouse ambulate with him into bathroom and educated on appropriate safety cues. OT educated pt's spouse on use of a timer to help him terminate toileting activity. OT set timer for 10 mins and gave him cue at 5 minutes. Pt was able to finish up toileting tasks and wash his hands within 10 minutes. Spouse walked with pt to therapy gym and OT educated on technique for stairs. Pt and spouse completed without difficulty. Pt then brought to therapy apartment and spouse was educated on tub shower transfers, which pt demonstrated understanding. Pt also was educated on furniture transfers and fall risk. Pt returned to room ambulating with spouse and RW. PT left seated in wc with spouse present and needs met.   Patient has met 11 of 12 long term goals due to improved activity tolerance, improved balance, postural control, ability to compensate for deficits, improved attention, improved awareness and improved coordination.  Patient to discharge at overall Supervision level.  Patient's care partner is independent to provide the necessary physical and cognitive assistance at discharge.    Reasons goals not met: Pt still requires mod A for awareness  Recommendation:  Patient will benefit from ongoing skilled OT services in home health setting to continue to advance functional skills in the area of BADL and Reduce care partner burden.  Equipment: No equipment provided  Reasons for discharge: treatment goals met and discharge from hospital  Patient/family agrees with progress made and goals achieved: Yes  OT Discharge Precautions/Restrictions   Precautions Precautions: Fall Restrictions Weight Bearing Restrictions: No Pain Pain Assessment Pain Scale: 0-10 Pain Score: 0-No pain Faces Pain Scale: No hurt ADL ADL Eating: Supervision/safety Grooming: Supervision/safety Upper Body Bathing: Supervision/safety Lower Body Bathing: Supervision/safety Upper Body Dressing: Supervision/safety Lower Body Dressing: Supervision/safety Toileting: Supervision/safety Toilet Transfer: Close supervision Tub/Shower Transfer: Close supervison Cognition Overall Cognitive Status: Impaired/Different from baseline Arousal/Alertness: Awake/alert Orientation Level: Oriented to person;Oriented to place;Oriented to situation Focused Attention: Appears intact Sustained Attention: Impaired Sustained Attention Impairment: Verbal basic;Functional basic Memory: Impaired Memory Impairment: Decreased recall of new information;Decreased short term memory Decreased Short Term Memory: Verbal basic;Functional basic Awareness Impairment: Emergent impairment Problem Solving: Impaired Problem Solving Impairment: Functional basic;Verbal basic Behaviors: Poor frustration tolerance Safety/Judgment: Impaired Sensation Sensation Light Touch: Not tested Proprioception: Not tested Coordination Gross Motor Movements are Fluid and Coordinated: No Fine Motor Movements are Fluid and Coordinated: Yes Coordination and Movement Description: decreased smoothness and accuracy L UE>R UE Finger Nose Finger Test: difficulty following commands to assess Motor  Motor Motor - Discharge Observations: generalized deconditioning Mobility  Bed Mobility Bed Mobility: Rolling Right;Supine to Sit;Rolling Left;Sit to Supine Rolling Right: Supervision/verbal cueing Rolling Left: Supervision/Verbal cueing Supine to Sit: Supervision/Verbal cueing Sit to Supine: Supervision/Verbal cueing Transfers Sit to Stand: Supervision/Verbal cueing Stand to Sit: Supervision/Verbal cueing   Balance Balance Balance Assessed: Yes Static Sitting Balance Static Sitting - Balance Support: Feet supported Static Sitting - Level of Assistance: 5: Stand by assistance Dynamic Sitting Balance Dynamic Sitting - Balance Support: During functional activity Dynamic Sitting - Level of Assistance: 5: Stand by assistance Static Standing Balance Static Standing - Balance Support: During functional activity Static Standing - Level of Assistance:  5: Stand by assistance Dynamic Standing Balance Dynamic Standing - Balance Support: During functional activity Dynamic Standing - Level of Assistance: 5: Stand by assistance Extremity/Trunk Assessment RUE Assessment RUE Assessment: Within Functional Limits LUE Assessment LUE Assessment: Within Functional Limits   Daneen Schick Marie Borowski 03/14/2020, 3:52 PM

## 2020-03-14 NOTE — Progress Notes (Signed)
Physical Therapy Session Note  Patient Details  Name: Duane Santos MRN: [id number] Date of Birth: 20-Aug-1946  Today's Date: 03/14/2020 PT Individual Time: 1350-1415 PT Individual Time Calculation (min): 25 min   Short Term Goals: Week 3:  PT Short Term Goal 1 (Week 3): STG = LTG due to estimated d/c date.  Skilled Therapeutic Interventions/Progress Updates:    Arrived for additional "bonus" unscheduled PT session to continue family education. Supervised patient and his spouse performing change of pants/brief and ambulating into the bathroom, wife able to provide appropriate levels of assist and cuing for mobility and safety. Otherwise went back to steps and continued practicing stair navigation with spouse, able to do so safely but did need external cues from PT to get feet fully on steps instead of just putting his forefoot on the steps for safety. Supervised wife ambulating patient back to room, able to provide appropriate levels of min guard during gait. Education provided on having good footwear on feet at all times, increased level of guarding if he is ever taking steps backward due to shuffling pattern, positioning of wife for appropriate guarding during walking, and for appropriate foot placement on steps for safety. All other questions from wife and spouse answered to their satisfaction today. Left up in chair with wife providing direct supervision and RN present; wife educated to let nursing know when she leaves so safety seat belt can be placed back on patient.  Therapy Documentation Precautions:  Precautions Precautions: Fall Restrictions Weight Bearing Restrictions: No Pain: Pain Assessment Pain Scale: 0-10 Pain Score: 0-No pain Faces Pain Scale: No hurt    Therapy/Group: Individual Therapy   Windell Norfolk, DPT, PN1   Supplemental Physical Therapist [place]    Pager [phone] Acute Rehab Office [phone]    03/14/2020, 3:35 PM

## 2020-03-14 NOTE — Progress Notes (Signed)
Pharmacy Antibiotic Note  Duane Santos is a 74 y.o. male admitted on 02/24/2020 with meningitis. Pharmacy has been consulted for Ambisome and flucytosine (D#27/28) dosing for cryptococcal meningitis.   Assessment: 71 YOM with myasthenia gravis on prednisone/mycophenolate. Noted to have positive cryptococcus Ag and high titer. Started on amphotericin - liposomal and flucytosine evening on 3/11. He had LP and BAL performed 3/12 - CSF cx with yeast. Transferred to inpatient rehab on 3/18. 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   Scr increased slightly to 0.94. Recommend continuing pre-dose 500 mL bolus and post-dose 1000 mL bolus fluids. Magnesium within normal limits at 2 after 2 gm Mag sulfate yesterday. Potassium up to 4.1 after 80 mEq replacement yesterday.   Plan: Continue amphotericin B 200 mg IV daily  Continue normal saline 500 mL pre-dose and 1000 mL post-dose for renal protection  Continue potassium chloride 60 mEQ replacement divided in 2 doses Continue to trend Scr and if elevated >1, consider increasing pre-dose NS bolus to 750 mL.  Monitor renal function, electrolytes, clinical improvement   Last day of amphotericin B will be 4/7 - Stop date entered Will begin fluconazole 400 mg daily 4/8   Height: _0  (167.6 cm) Weight: 54.5 kg (120 lb 2.4 oz) IBW/kg (Calculated) : 63.8  Temp (24hrs), Avg:98.4 F (36.9 C), Min:97.6 F (36.4 C), Max:99 F (37.2 C)  Recent Labs  Lab 03/10/20 0500 03/11/20 0438 03/12/20 0405 03/13/20 0314 03/14/20 1108  CREATININE 0.75 0.74 0.69 0.77 0.94    Estimated Creatinine Clearance: 54.8 mL/min (by C-G formula based on SCr of 0.94 mg/dL).    No Known Allergies  Antimicrobials this admission: Fluconazole 4/8> Ambisome 3/11>>(4/7) Flucytosine 3/11>>(4/7) Cefepime/Vancomycin 3/6>3/8 Zosyn 3/8 >> 3/15  Thank you for allowing pharmacy to be a part of this patient's care.  Jimmy Footman, PharmD, BCPS, BCIDP Infectious Diseases  Clinical Pharmacist Phone: [phone] 03/14/2020 1:18 PM

## 2020-03-15 LAB — BASIC METABOLIC PANEL
Anion gap: 9 (ref 5–15)
BUN: 22 mg/dL (ref 8–23)
CO2: 21 mmol/L — ABNORMAL LOW (ref 22–32)
Calcium: 8.9 mg/dL (ref 8.9–10.3)
Chloride: 104 mmol/L (ref 98–111)
Creatinine, Ser: 0.95 mg/dL (ref 0.61–1.24)
GFR calc Af Amer: >60 mL/min (ref >60)
GFR calc non Af Amer: >60 mL/min (ref >60)
Glucose, Bld: 88 mg/dL (ref 70–99)
Potassium: 4 mmol/L (ref 3.5–5.1)
Sodium: 134 mmol/L — ABNORMAL LOW (ref 135–145)

## 2020-03-15 LAB — MAGNESIUM: Magnesium: 1.9 mg/dL (ref 1.7–2.4)

## 2020-03-15 LAB — CRYPTOCOCCAL ANTIGEN
Crypto Ag: POSITIVE — AB
Cryptococcal Ag Titer: 640 — AB

## 2020-03-15 MED ORDER — SODIUM CHLORIDE 0.9 % IV BOLUS FOR AMBISOME: 1000.0000 mL | INTRAVENOUS | Status: DC

## 2020-03-15 MED ORDER — GIZMO CONDOM CATHETER MISC: 1.0000 "application " | Freq: Every day | 0 refills | Status: DC

## 2020-03-15 MED ORDER — FLUCYTOSINE 250 MG PO CAPS: 1250.0000 mg | ORAL_CAPSULE | Freq: Once | ORAL | 0 refills | Status: AC

## 2020-03-15 MED ORDER — MELATONIN 3 MG PO TABS: 3.0000 mg | ORAL_TABLET | Freq: Every day | ORAL | 0 refills | Status: DC

## 2020-03-15 MED ORDER — DEXTROSE 5 % IV SOLN
200.0000 mg | INTRAVENOUS | Status: DC
  Filled 2020-03-15: qty 50

## 2020-03-15 MED ORDER — SODIUM CHLORIDE 1 G PO TABS: 1.0000 g | ORAL_TABLET | Freq: Three times a day (TID) | ORAL | 1 refills | Status: DC

## 2020-03-15 MED ORDER — ACETAMINOPHEN 325 MG PO TABS: 325.0000 mg | ORAL_TABLET | ORAL | Status: DC | PRN

## 2020-03-15 MED ORDER — THIAMINE HCL 100 MG PO TABS: 100.0000 mg | ORAL_TABLET | Freq: Every day | ORAL | 0 refills | Status: DC

## 2020-03-15 MED ORDER — FLUCONAZOLE 200 MG PO TABS: 400.0000 mg | ORAL_TABLET | Freq: Every day | ORAL | 1 refills | Status: DC

## 2020-03-15 MED ORDER — FLUCYTOSINE 250 MG PO CAPS: 1250.0000 mg | ORAL_CAPSULE | Freq: Once | ORAL | 0 refills | Status: DC

## 2020-03-15 MED ORDER — PREDNISONE 5 MG PO TABS: 5.0000 mg | ORAL_TABLET | Freq: Every day | ORAL | 0 refills | Status: DC

## 2020-03-15 MED ORDER — POTASSIUM CHLORIDE CRYS ER 20 MEQ PO TBCR: 40.0000 meq | EXTENDED_RELEASE_TABLET | Freq: Every day | ORAL | 0 refills | Status: DC

## 2020-03-15 MED ORDER — SENNOSIDES-DOCUSATE SODIUM 8.6-50 MG PO TABS: 1.0000 | ORAL_TABLET | Freq: Every evening | ORAL | Status: DC | PRN

## 2020-03-15 MED ORDER — MUSCLE RUB 10-15 % EX CREA: 1.0000 "application " | TOPICAL_CREAM | CUTANEOUS | 0 refills | Status: DC | PRN

## 2020-03-15 MED ORDER — APIXABAN 5 MG PO TABS: 5.0000 mg | ORAL_TABLET | Freq: Two times a day (BID) | ORAL | 1 refills | Status: DC

## 2020-03-15 MED ORDER — SODIUM CHLORIDE 0.9 % IV BOLUS FOR AMBISOME: 500.0000 mL | INTRAVENOUS | Status: DC

## 2020-03-15 NOTE — Discharge Summary (Signed)
Physician Discharge Summary  Patient ID: Duane Santos MRN: [id number] DOB/AGE: [birth year] 74 y.o.  Admit date: 02/24/2020 Discharge date: 03/15/2020  Discharge Diagnoses:  Principal Problem:   Cryptococcal meningitis ([place]) Active Problems:   Myasthenia gravis ([place])   Right lower lobe lung mass   History of COVID-19 pneumonia with hypoxia   Stage I pressure ulcer of sacral region   Protein-calorie malnutrition, severe   Macrocytic anemia   Transaminitis   Nocturnal enuresis   Discharged Condition: stable  Significant Diagnostic Studies: DG Chest Santos 1 View  Result Date: 02/23/2020 CLINICAL DATA:  Pulmonary disease.  History of lung cancer EXAM: PORTABLE CHEST 1 VIEW COMPARISON:  02/12/2020.  Chest CT 02/17/2020 FINDINGS: Left PICC line in place with the tip in the SVC. Heart is normal size. Nodule/mass in the superior segment of the right lower lobe noted as seen on recent CT. Interstitial prominence and vague airspace disease noted in the lungs bilaterally. Heart is normal size. No effusions. No acute bony abnormality. Old left rib fractures. IMPRESSION: Right lower lobe nodule/mass again noted as seen on recent CT. Vague airspace opacities and interstitial prominence within the lungs. Electronically Signed   By: Rolm Baptise M.D.   On: 02/23/2020 12:29    DG FLUORO GUIDE LUMBAR PUNCTURE  Result Date: 02/27/2020 CLINICAL DATA:  Cryptococcal meningitis. EXAM: DIAGNOSTIC LUMBAR PUNCTURE UNDER FLUOROSCOPIC GUIDANCE FLUOROSCOPY TIME:  Fluoroscopy Time:  48 seconds Number of Acquired Spot Images: 3 PROCEDURE: Informed consent was obtained from the patient prior to the procedure, including potential complications of headache, allergy, and pain. With the patient prone, the lower back was prepped with Betadine. 1% Lidocaine was used for local anesthesia. Lumbar puncture was performed at the L3-4 level using a 20 gauge needle with return of clear colorless CSF with an opening pressure of less  than 7 cm water. 6 ml of CSF were obtained for laboratory studies. The patient tolerated the procedure well and there were no apparent complications. IMPRESSION: Successful lumbar puncture with fluoroscopic guidance at L3-4. Electronically Signed   By: Nolon Nations M.D.   On: 02/27/2020 15:30    Labs:  Basic Metabolic Panel: Recent Labs  Lab 03/10/20 0500 03/11/20 0438 03/12/20 0405 03/13/20 0314 03/14/20 1108 03/15/20 0314  NA 134* 137 135 136 133* 134*  K 3.6 4.3 3.9 3.9 4.1 4.0  CL 102 104 103 105 106 104  CO2 23 24 23 22  21* 21*  GLUCOSE 94 104* 104* 109* 121* 88  BUN 17 19 19 21 23 22   CREATININE 0.75 0.74 0.69 0.77 0.94 0.95  CALCIUM 8.8* 9.2 8.9 8.9 8.9 8.9  MG 1.9 2.0 1.8 1.9 2.0 1.9    CBC: CBC Latest Ref Rng & Units 03/06/2020 03/03/2020 03/02/2020  WBC 4.0 - 10.5 K/uL 5.0 4.5 5.0  Hemoglobin 13.0 - 17.0 g/dL 9.7(L) 9.3(L) 9.7(L)  Hematocrit 39.0 - 52.0 % 29.1(L) 28.3(L) 29.2(L)  Platelets 150 - 400 K/uL 215 225 243    CBG: No results for input(s): GLUCAP in the last 168 hours.  Brief HPI:   Duane Santos is a 74 y.o. male with history of myasthenia gravis, Covid 19 0/5397 complicated by cor pulmonale/PE/LLE DVT as well as incidental findings of right pulmonary nodules with sclerotic changes in ribs.  He was discharged to home on oxygen, seen by heme-onc with plans for PET scan but was admitted to [facility] on 02/09/2020 with 2-week history of progressive confusion and weakness.  He was noted to be severely hyponatremic  with sodium of 128 and started on IV placed for hydration.  MRI brain done revealing several small areas of restricted diffusion in bilateral cerebellum question infarct versus metastatic disease.  EEG was negative for seizures.   Neurology was consulted for input and patient started on IV steroids due to concern of MG flare.  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  He developed fevers up to 103 degrees, was  started on broad-spectrum antibiotics but continued to have low-grade fevers.  Dr. Ola Spurr  was consulted for input and recommended LP a swell as lung biopsy for work-up. Lung biopsy done revealing numerous fungal yeast forms c/w cryptococcus species. Serum CRAG was positive with elevated titers. LP done showing elevated pressures, yeast on Gram stain and culture showed cryptococcus neoformans.   He was started on amphotericin and flucytosine on 3/11 treatment/induction for severe disseminated cryptococcus meningitis with recommendations to wean off steroids  Hypoxia was resolving but he continued to be limited due to debility, had tachycardia with activity, cognitive deficits as well as poor safety awareness.  CIR was recommended due to functional decline.   Hospital Course: Duane Santos was admitted to rehab 02/24/2020 for inpatient therapies to consist of PT, ST and OT at least three hours five days a week. Past admission physiatrist, therapy team and rehab RN have worked together to provide customized collaborative inpatient rehab.  His mentation has greatly improved and lethargy has resolved.  Nutritional supplements were offered between meals due to low calorie malnutrition.  His blood pressures were monitored on TID basis and and have been stable.  Dr. Comer/ID has followed up for input and recommended repeat LP to monitor for efficacy of treatment. This was performed on 3/21 showing decrease opening pressures but glucose still elevated 27, WBC 104, protein 145 and was negative for yeast. Cultures showed no growth.    Pharmacy has been assisting with management of antifungals with close monitoring of electrolytes.  Hypokalemia hypomagnesemia has been supplemented on as needed basis.  He completed induction therapy on  04/07 and is to transition to high dose Fluconazole 400 mg daily for indefinite therapy.  He has been scheduled to follow up with Dr. Ola Spurr on 04/15.  Labs at discharge showed  that lytes WNL with stable potassium level.   He is to continue on potassium supplements for an additional week to prevent drop post treatment.  Recommend repeat check of lytes in 1 to 2 weeks after discharge  Serial CBC showed transient rise in WBC to 18.5 on 3/24 but question accuracy as it was back down to 5.0 the next day. H/H is relatively stable.  He continues on low dose prednisone to prevent MG flare as has been off Cellcept and was on long term prednisone prior to latter. Post covid hypoxia has resolved and respiratory status has been stable with increase in activity level. Multiple skin tears on BUE are healing with scabbed areas and foam dressing has been used for padding and protection. Frequency at nights has been managed with use of condom cath and wife has been instructed on scheduled toileting. He has made gains during rehab stay and is at supervision level.  He will continue to receive follow-up HHPT, [place] [place] [facility] [place] by [facility] after discharge.    Rehab course: During patient's stay in rehab weekly team conferences were held to monitor patient's progress, set goals and discuss barriers to discharge. At admission, patient required mod to max  assist with ADL task and min to max assist with mobility.  He exhibited severe cognitive deficits affecting orientation, ability to follow simple one-step commands, status initiation, attention and had delayed responses due to delayed processing. He has had improvement in activity tolerance, balance, postural control as well as ability to compensate for deficits.   He continues to require mod assist for awareness but is able to complete ADL tasks with supervision.  He requires supervision for transfers and to ambulate greater than 150 feet with rolling walker.  He requires mod verbal cues for orientation, mod assist for cognitive tasks and max cues for recall.  Family education was completed with wife regarding all aspects of safety, cognition  and mobility.   Disposition:  Home.   Diet: Regular  Special Instructions: 1. Needs assistance with cognitive tasks and medication management. 2. No driving.  3. Recommend repeat LFTs and BMET in 1-2 weeks.    Discharge Instructions    Ambulatory referral to Physical Medicine Rehab   Complete by: As directed    1-2 weeks TC appointment     Allergies as of 03/15/2020   No Known Allergies     Medication List    STOP taking these medications   acyclovir ointment 5 % Commonly known as: ZOVIRAX   alendronate 70 MG tablet Commonly known as: FOSAMAX   amphotericin B liposome 200 mg in dextrose 5 % 500 mL   ascorbic acid 500 MG tablet Commonly known as: VITAMIN C   B-complex with vitamin C tablet   Garlic 2355 MG Caps   Omega-3 1000 MG Caps   saccharomyces boulardii 250 MG capsule Commonly known as: FLORASTOR   testosterone 50 MG/5GM (1%) Gel Commonly known as: ANDROGEL   Turmeric 500 MG Caps   vitamin E 180 MG (400 UNITS) capsule Generic drug: vitamin E   zinc sulfate 220 (50 Zn) MG capsule     TAKE these medications   acetaminophen 325 MG tablet Commonly known as: TYLENOL Take 1-2 tablets (325-650 mg total) by mouth every 4 (four) hours as needed for mild pain.   apixaban 5 MG Tabs tablet Commonly known as: ELIQUIS Take 1 tablet (5 mg total) by mouth 2 (two) times daily.   fluconazole 200 MG tablet Commonly known as: DIFLUCAN Take 2 tablets (400 mg total) by mouth daily.   Gizmo Condom Catheter Misc 1 application by Does not apply route at bedtime.   melatonin 3 MG Tabs tablet Take 1 tablet (3 mg total) by mouth at bedtime. Notes to patient: Over the counter   multivitamin with minerals Tabs tablet Take 1 tablet by mouth every other day.   Muscle Rub 10-15 % Crea Apply 1 application topically as needed for muscle pain. To neck   potassium chloride SA 20 MEQ tablet Commonly known as: KLOR-CON Take 2 tablets (40 mEq total) by mouth  daily. Notes to patient: Will need to continue for a week as supplement   predniSONE 5 MG tablet Commonly known as: DELTASONE Take 1 tablet (5 mg total) by mouth daily with breakfast.   senna-docusate 8.6-50 MG tablet Commonly known as: Senokot-S Take 1 tablet by mouth at bedtime as needed for mild constipation.   sodium chloride 1 g tablet Take 1 tablet (1 g total) by mouth 3 (three) times daily with meals.   thiamine 100 MG tablet Take 1 tablet (100 mg total) by mouth daily.     ASK your doctor about these medications   flucytosine 250 MG capsule  Commonly known as: ANCOBON Take 5 capsules (1,250 mg total) by mouth once for 1 dose. Ask about: Should I take this medication?      Follow-up Information    [name], MD Follow up.   Specialty: Physical Medicine and Rehabilitation Why: Office will call you with follow up appointment Contact information: [address] [place] [address] [phone]        Cleotilde Neer, MD. Call on 03/16/2020.   Specialties: Neurology, Radiology Why: for post hospital follow up Contact information: [place] [address] [phone]        [name], MD Follow up on 03/23/2020.   Specialty: Infectious Diseases Why: appointment at 10:45 am/be there at 10:30 am Contact information: [place] [address] [phone]        Sallee Lange, NP. Call.   Specialty: Internal Medicine Why: Call for post hospital follow up Contact information: [place] [address] [phone]           Signed: Bary Leriche 03/16/2020, 11:49 PM

## 2020-03-15 NOTE — Progress Notes (Signed)
Patient discharged and left floor via personal WC accompanied by nursing staff and spouse. Personal belongings with patient.

## 2020-03-15 NOTE — Progress Notes (Signed)
Pharmacy Antibiotic Note  Duane Santos is a 74 y.o. male admitted on 02/24/2020 with meningitis. Pharmacy has been consulted for Ambisome and flucytosine (D#28/28) dosing for cryptococcal meningitis.   Assessment: 37 YOM with myasthenia gravis on prednisone/mycophenolate. Noted to have positive cryptococcus Ag and high titer. Started on amphotericin - liposomal and flucytosine evening on 3/11. He had LP and BAL performed 3/12 - CSF cx with yeast. Transferred to inpatient rehab on 3/18. 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   Scr increased slightly to 0.95. Recommend continuing pre-dose 500 mL bolus and post-dose 1000 mL bolus fluids. Magnesium slightly down to 1.9. Potassium down slightly down to 4.0 on daily replacement of 60 mEQ. Today is the last day of amphotericin B therapy.    Plan: Continue amphotericin B 200 mg IV daily  Continue normal saline 500 mL pre-dose and 1000 mL post-dose for renal protection  Last doses today  Reduce potassium to 40 mEQ daily since coming off of amphotericin B Monitor K closely for continued electrolyte wasting   Last day of amphotericin B will be 4/7 - Stop date entered Will begin fluconazole 400 mg daily 4/8   Height: _0  (167.6 cm) Weight: 54.5 kg (120 lb 2.4 oz) IBW/kg (Calculated) : 63.8  Temp (24hrs), Avg:98.2 F (36.8 C), Min:97.8 F (36.6 C), Max:98.7 F (37.1 C)  Recent Labs  Lab 03/11/20 0438 03/12/20 0405 03/13/20 0314 03/14/20 1108 03/15/20 0314  CREATININE 0.74 0.69 0.77 0.94 0.95    Estimated Creatinine Clearance: 54.2 mL/min (by C-G formula based on SCr of 0.95 mg/dL).    No Known Allergies  Antimicrobials this admission: Fluconazole 4/8> Ambisome 3/11>>(4/7) Flucytosine 3/11>>(4/7) Cefepime/Vancomycin 3/6>3/8 Zosyn 3/8 >> 3/15  Thank you for allowing pharmacy to be a part of this patient's care.  Jimmy Footman, PharmD, BCPS, BCIDP Infectious Diseases Clinical Pharmacist Phone: [phone] 03/15/2020  9:21 AM

## 2020-03-15 NOTE — Plan of Care (Signed)
  Problem: Consults Goal: RH GENERAL PATIENT EDUCATION Description: See Patient Education module for education specifics. Outcome: Completed/Met Goal: Skin Care Protocol Initiated - if Braden Score 18 or less Description: If consults are not indicated, leave blank or document N/A Outcome: Completed/Met Goal: Nutrition Consult-if indicated Outcome: Completed/Met   Problem: RH BOWEL ELIMINATION Goal: RH STG MANAGE BOWEL WITH ASSISTANCE Description: STG Manage Bowel with mod I Assistance. Outcome: Completed/Met Goal: RH STG MANAGE BOWEL W/MEDICATION W/ASSISTANCE Description: STG Manage Bowel with Medication with mod I Assistance. Outcome: Completed/Met   Problem: RH BLADDER ELIMINATION Goal: RH STG MANAGE BLADDER WITH ASSISTANCE Description: STG Manage Bladder With min Assistance Outcome: Completed/Met Goal: RH STG MANAGE BLADDER WITH EQUIPMENT WITH ASSISTANCE Description: STG Manage Bladder With Equipment With min Assistance Outcome: Completed/Met   Problem: RH SKIN INTEGRITY Goal: RH STG SKIN FREE OF INFECTION/BREAKDOWN Description: Will have no new areas of break down Outcome: Completed/Met Goal: RH STG MAINTAIN SKIN INTEGRITY WITH ASSISTANCE Description: STG Maintain Skin Integrity With mod I Assistance. Outcome: Completed/Met Goal: RH STG ABLE TO PERFORM INCISION/WOUND CARE W/ASSISTANCE Description: STG Able To Perform Incision/Wound Care With mod I Assistance. Outcome: Completed/Met   Problem: RH SAFETY Goal: RH STG ADHERE TO SAFETY PRECAUTIONS W/ASSISTANCE/DEVICE Description: STG Adhere to Safety Precautions With Assistance/Device. Outcome: Completed/Met   Problem: RH PAIN MANAGEMENT Goal: RH STG PAIN MANAGED AT OR BELOW PT'S PAIN GOAL Description: Pain scale <2/10 Outcome: Completed/Met   Problem: RH KNOWLEDGE DEFICIT GENERAL Goal: RH STG INCREASE KNOWLEDGE OF SELF CARE AFTER HOSPITALIZATION Outcome: Completed/Met

## 2020-03-16 NOTE — Progress Notes (Signed)
Social Work Discharge Note   The overall goal for the admission was met for:   Discharge location: Yes. D/c to home with his wife.   Length of Stay: Yes. 19 days.  Discharge activity level: Yes. 24/7 supervision due to cognition  Home/community participation: Yes. Limited.  Services provided included: MD, RD, PT, OT, SLP, RN, CM, TR, Pharmacy, Neuropsych and SW  Financial Services: Private Insurance: [employer] Medicare  Follow-up services arranged: Home Health: [facility] for PT/OT/ST/SNA, DME: [facility] for transport w/c and RW and Patient/Family request agency HH: [facility], DME: N/A  Comments (or additional information): contact pt wife [name] [phone])  Patient/Family verbalized understanding of follow-up arrangements: Yes  Individual responsible for coordination of the follow-up plan: Pt will have assistance with care needs from his wife.   Confirmed correct DME delivered: Rana Snare 03/16/2020    Rana Snare

## 2020-03-17 ENCOUNTER — Telehealth: Payer: Self-pay | Admitting: Registered Nurse

## 2020-03-17 NOTE — Telephone Encounter (Signed)
Placed a call to Ms. Ragland, no answer, left message to return the call. Scheduled appointment information eft on voicemail. Awaiting a return call.

## 2020-03-19 LAB — CULTURE, FUNGUS WITHOUT SMEAR

## 2020-03-29 ENCOUNTER — Encounter: Payer: Medicare HMO | Admitting: Physical Medicine & Rehabilitation

## 2020-03-30 ENCOUNTER — Other Ambulatory Visit: Payer: Self-pay | Admitting: Infectious Diseases

## 2020-03-30 DIAGNOSIS — B451 Cerebral cryptococcosis: Secondary | ICD-10-CM

## 2020-04-14 ENCOUNTER — Ambulatory Visit
Admission: RE | Admit: 2020-04-14 | Discharge: 2020-04-14 | Disposition: A | Discharge status: Home / Self Care | Payer: Medicare HMO | Source: Ambulatory Visit | Attending: Infectious Diseases | Admitting: Infectious Diseases

## 2020-04-14 ENCOUNTER — Other Ambulatory Visit: Payer: Self-pay

## 2020-04-14 DIAGNOSIS — B451 Cerebral cryptococcosis: Secondary | ICD-10-CM | POA: Insufficient documentation

## 2020-04-14 MED ORDER — GADOBUTROL 1 MMOL/ML IV SOLN
6.0000 mL | Freq: Once | INTRAVENOUS | Status: AC | PRN
  Administered 2020-04-14: 6 mL via INTRAVENOUS

## 2020-04-26 MED ORDER — PREDNISONE 5 MG PO TABS
5.00 | ORAL_TABLET | ORAL | Status: DC
Start: 2020-04-27 — End: 2020-04-26

## 2020-04-26 MED ORDER — ENOXAPARIN SODIUM 40 MG/0.4ML ~~LOC~~ SOLN
40.00 | SUBCUTANEOUS | Status: DC
Start: 2020-04-27 — End: 2020-04-26

## 2020-04-26 MED ORDER — ACETAMINOPHEN 325 MG PO TABS
650.00 | ORAL_TABLET | ORAL | Status: DC
Start: ? — End: 2020-04-26

## 2020-04-26 MED ORDER — MELATONIN 3 MG PO TABS
3.00 | ORAL_TABLET | ORAL | Status: DC
Start: ? — End: 2020-04-26

## 2020-04-26 MED ORDER — POLYETHYLENE GLYCOL 3350 17 GM/SCOOP PO POWD
17.00 | ORAL | Status: DC
Start: 2020-04-27 — End: 2020-04-26

## 2020-04-26 MED ORDER — SENNOSIDES 8.6 MG PO TABS
1.00 | ORAL_TABLET | ORAL | Status: DC
Start: 2020-04-26 — End: 2020-04-26

## 2020-04-26 MED ORDER — FLUCONAZOLE 200 MG PO TABS
800.00 | ORAL_TABLET | ORAL | Status: DC
Start: 2020-04-27 — End: 2020-04-26

## 2020-04-26 MED ORDER — CHOLECALCIFEROL 125 MCG (5000 UT) PO TABS
125.00 | ORAL_TABLET | ORAL | Status: DC
Start: 2020-04-27 — End: 2020-04-26

## 2020-04-26 MED ORDER — SODIUM CHLORIDE 1 G PO TABS
1.00 | ORAL_TABLET | ORAL | Status: DC
Start: 2020-04-26 — End: 2020-04-26

## 2020-04-26 MED ORDER — SALINE NASAL SPRAY 0.65 % NA SOLN
1.00 | NASAL | Status: DC
Start: ? — End: 2020-04-26

## 2020-07-21 IMAGING — CT CT CHEST SUPER D W/ CM
2 of 4 series · 13 of 36 positions shown, 16 images · IV contrast (omnipaque)
Comparison: Prior PET-CT of 01/24/2020

CLINICAL DATA: 73-year-old male with altered mental status, history
of multifocal pneumonia or in the setting of myasthenia gravis.

EXAM:
CT CHEST WITH CONTRAST
TECHNIQUE: Multidetector CT imaging of the chest was performed using thin slice
collimation for electromagnetic bronchoscopy planning purposes, with
intravenous contrast.
CONTRAST:  60mL OMNIPAQUE IOHEXOL 300 MG/ML  SOLN

[Series 5: super d · axial · 0.66mm/px · z∈[+138,+378]mm · 10 of 364 slices shown, 13 images]
[im 32/364  mediastinal]
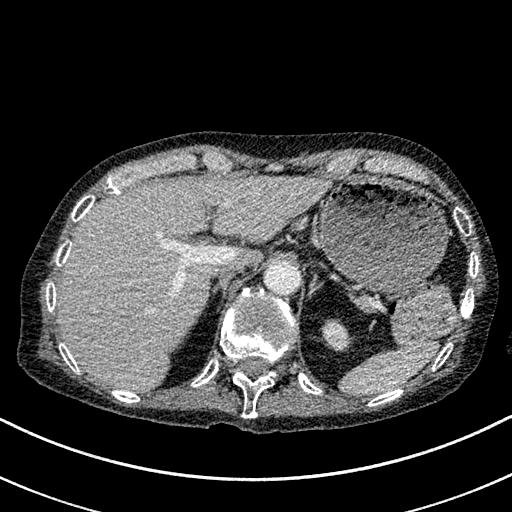
[im 32/364  lung]
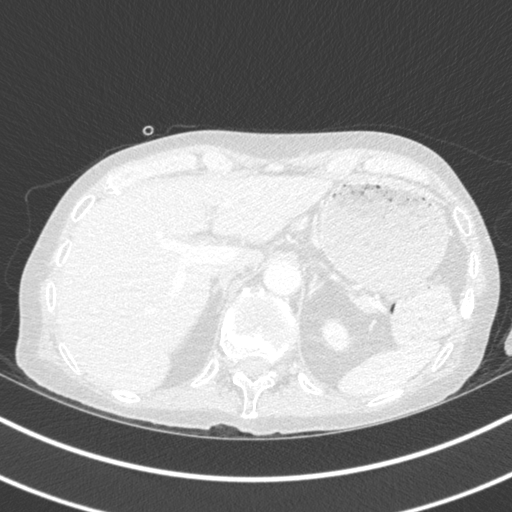
[im 64/364  lung]
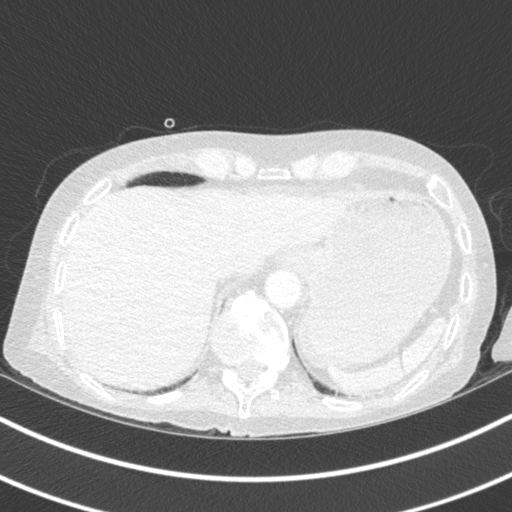
[im 95/364  lung]
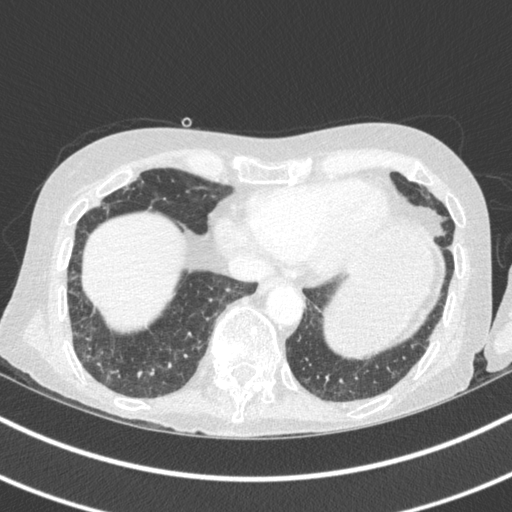
[im 127/364  lung]
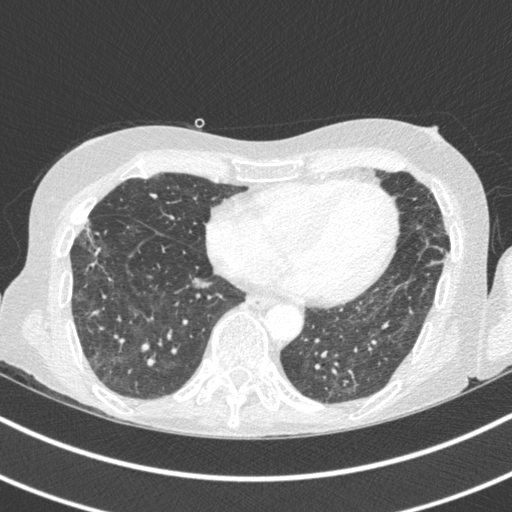
[im 158/364  mediastinal]
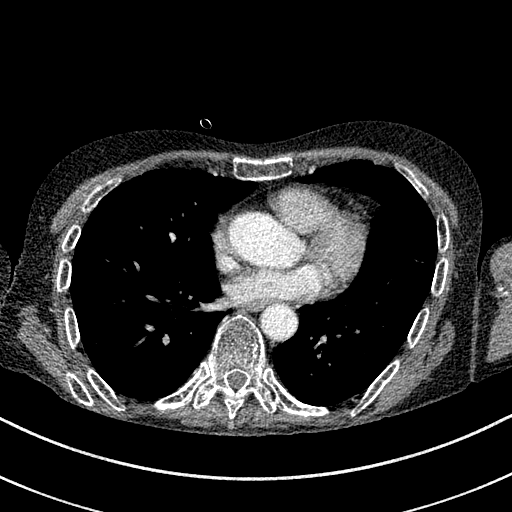
[im 158/364  lung]
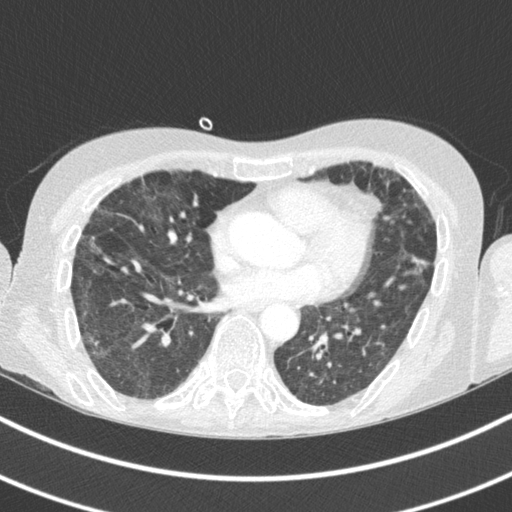
[im 206/364  lung]
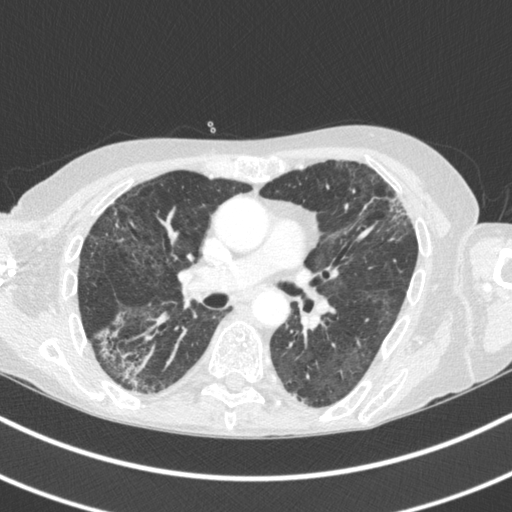
[im 237/364  lung]
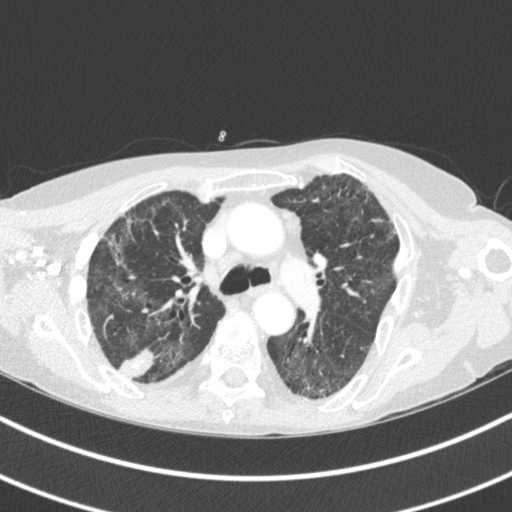
[im 269/364  lung]
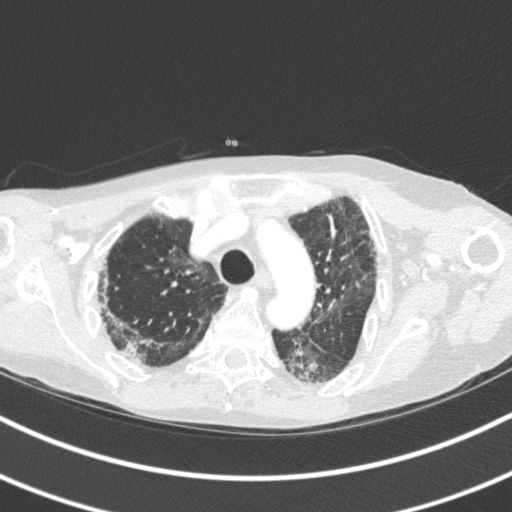
[im 300/364  mediastinal]
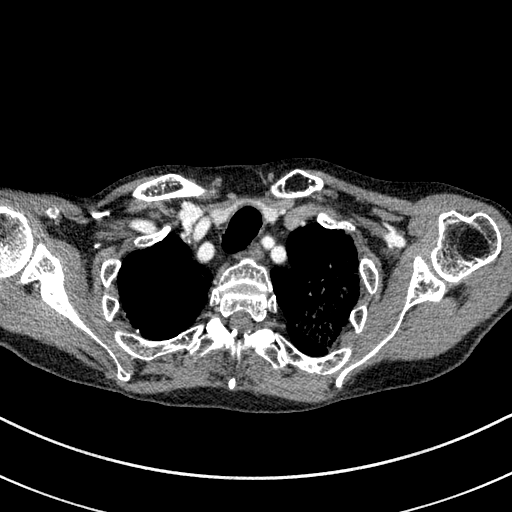
[im 300/364  lung]
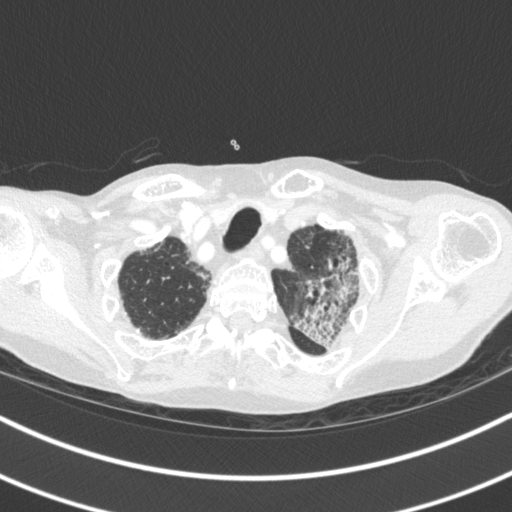
[im 332/364  lung]
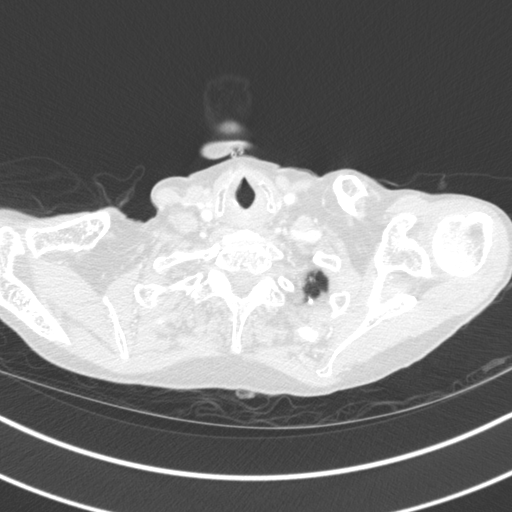

[Series 6: coronal · coronal · 0.61mm/px · 3 of 106 slices shown]
[im 22/106  lung]
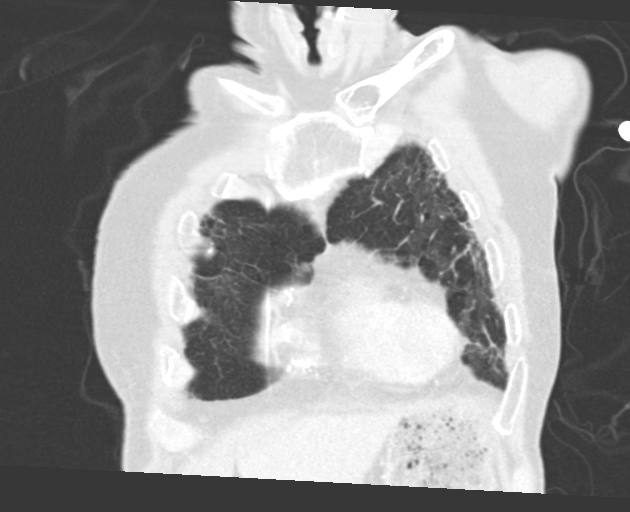
[im 43/106  lung]
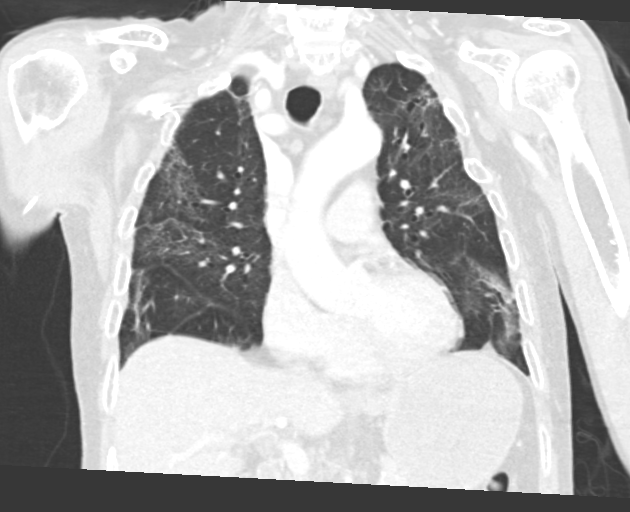
[im 64/106  lung]
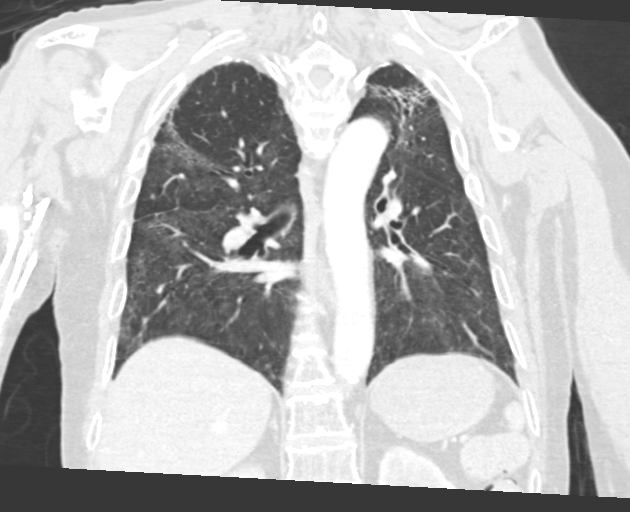

[13 of 36 positions shown; findings below may reference images not displayed]

FINDINGS: Cardiovascular: Ascending dilated to 3.5 cm. Scattered
atherosclerosis [REDACTED] is calcification. Heart size is normal
without pericardial effusion. Scattered coronary artery
calcification. Central pulmonary vasculature is normal.

Mediastinum/Nodes: Small lymph nodes throughout the mediastinum
unchanged since recent imaging studies largest 7 mm along the right
paratracheal chain. Other smaller nodes track towards the thoracic
inlet. No thoracic inlet adenopathy. No axillary lymphadenopathy.

No nodal enlargement beyond a cm in the bilateral hila. Correlate
with recent PET exam which shows right hilar FDG uptake.

Lungs/Pleura: Signs of ground-glass, diminished in general since the
previous exam now with more peripheral predominance.

Right lower lobe nodule found to be FDG avid in the medial right
lung base (image 242, series [DATE] by 0.7 cm not changed accounting
for the degree of respiratory motion and was present on the CT
angiography there was performed Tuesday December, 2019 though not well seen
on this study due to the motion artifact.

Spiculated nodule in the superior segment of the right lower lobe
with some central area of added density suggesting some
calcification. This measures 2.1 by 2.0 cm, showing a similar
appearance. Development of some bronchiectatic changes since the
prior study in the left upper lobe, lingula and left lower lobe.
Septal thickening is more pronounced also with dilation of left
upper lobe bronchi. Process shows a more apical and peripheral than
basilar predominance and is occurred in the short interval. No
consolidation. No pleural effusion.

Upper Abdomen: Incidental imaging of upper abdominal contents is
unremarkable.

Musculoskeletal: Unchanged appearance of sclerotic foci and
right-sided ribs. Signs of prior fracture to left-sided ribs
midthoracic compression fractures without interval change.
IMPRESSION: 1. Signs of bronchiectatic changes and septal thickening more so
than the ground-glass that was seen on the previous exam.
Bronchiectasis was present previously and may be slightly worse on
today's exam though the lung parenchyma is better evaluated on
today's study. Post inflammatory fibrosis is considered, by report
in the medical record the patient has a history of COVID infection.
2. Spiculated nodule superior segment right lower lobe with central
area of added density suggesting some calcification, wall finding
remains concerning for bronchogenic neoplasm the possibility of
sequela of granulomatous infection could also be considered. Biopsy
is suggested.
3. Correlate with recent PET exam which shows right hilar FDG
uptake.
4. Stable appearance of sclerotic foci in the right-sided ribs.
5. Dilation of the ascending aorta to 3.5 cm. Recommend annual
imaging followup by CTA or MRA. This recommendation follows 2848
ACCF/AHA/AATS/ACR/ASA/SCA/CUNEO/HIEN/NASRAT ALI/OUM ANES Guidelines for the
Diagnosis and Management of Patients with Thoracic Aortic Disease.
Circulation.2848; 121: E266-e369. Aortic aneurysm NOS ([id number])
6. Pulmonary embolism seen on prior exam is not demonstrated on the
current exam.
7. Scattered coronary artery calcification.

## 2020-08-03 ENCOUNTER — Other Ambulatory Visit: Payer: Self-pay | Admitting: Physical Medicine and Rehabilitation

## 2020-08-04 ENCOUNTER — Other Ambulatory Visit: Payer: Self-pay | Admitting: Physical Medicine and Rehabilitation

## 2020-10-16 ENCOUNTER — Other Ambulatory Visit: Payer: Self-pay | Admitting: Infectious Diseases

## 2020-10-16 ENCOUNTER — Other Ambulatory Visit (HOSPITAL_COMMUNITY): Payer: Self-pay | Admitting: Infectious Diseases

## 2020-10-16 DIAGNOSIS — B451 Cerebral cryptococcosis: Secondary | ICD-10-CM

## 2020-10-16 DIAGNOSIS — G910 Communicating hydrocephalus: Secondary | ICD-10-CM

## 2020-10-31 ENCOUNTER — Ambulatory Visit
Admission: RE | Admit: 2020-10-31 | Discharge: 2020-10-31 | Disposition: A | Discharge status: Home / Self Care | Payer: Medicare HMO | Source: Ambulatory Visit | Attending: Infectious Diseases | Admitting: Infectious Diseases

## 2020-10-31 ENCOUNTER — Other Ambulatory Visit: Payer: Self-pay

## 2020-10-31 DIAGNOSIS — B451 Cerebral cryptococcosis: Secondary | ICD-10-CM | POA: Diagnosis present

## 2020-10-31 DIAGNOSIS — G910 Communicating hydrocephalus: Secondary | ICD-10-CM | POA: Insufficient documentation

## 2020-10-31 MED ORDER — GADOBUTROL 1 MMOL/ML IV SOLN
5.0000 mL | Freq: Once | INTRAVENOUS | Status: AC | PRN
  Administered 2020-10-31: 5 mL via INTRAVENOUS

## 2021-05-15 ENCOUNTER — Other Ambulatory Visit: Payer: Self-pay

## 2021-05-15 ENCOUNTER — Ambulatory Visit
Admission: RE | Admit: 2021-05-15 | Discharge: 2021-05-15 | Disposition: A | Discharge status: Home / Self Care | Payer: Medicare HMO | Source: Ambulatory Visit | Attending: Family Medicine | Admitting: Family Medicine

## 2021-05-15 VITALS — BP 122/62 | HR 55 | Temp 97.8°F | Resp 16 | Wt 126.0 lb

## 2021-05-15 DIAGNOSIS — L03012 Cellulitis of left finger: Secondary | ICD-10-CM | POA: Diagnosis not present

## 2021-05-15 HISTORY — DX: Cerebral cryptococcosis: B45.1

## 2021-05-15 MED ORDER — MUPIROCIN 2 % EX OINT: 1.0000 "application " | TOPICAL_OINTMENT | Freq: Three times a day (TID) | CUTANEOUS | 0 refills | Status: AC

## 2021-05-15 MED ORDER — DOXYCYCLINE HYCLATE 100 MG PO CAPS: 100.0000 mg | ORAL_CAPSULE | Freq: Two times a day (BID) | ORAL | 0 refills | Status: DC
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
## 2021-05-15 NOTE — ED Triage Notes (Signed)
Pt c/o left thumb finger pain, concerned for infection no injury. Pt states it has been this way for 3 weeks

## 2021-06-15 ENCOUNTER — Other Ambulatory Visit (HOSPITAL_COMMUNITY): Payer: Self-pay | Admitting: Infectious Diseases

## 2021-06-15 ENCOUNTER — Other Ambulatory Visit: Payer: Self-pay | Admitting: Infectious Diseases

## 2021-06-15 DIAGNOSIS — B451 Cerebral cryptococcosis: Secondary | ICD-10-CM

## 2021-06-15 DIAGNOSIS — R7989 Other specified abnormal findings of blood chemistry: Secondary | ICD-10-CM

## 2021-07-06 ENCOUNTER — Ambulatory Visit
Admission: RE | Admit: 2021-07-06 | Discharge: 2021-07-06 | Disposition: A | Discharge status: Home / Self Care | Payer: Medicare HMO | Source: Ambulatory Visit | Attending: Infectious Diseases | Admitting: Infectious Diseases

## 2021-07-06 ENCOUNTER — Other Ambulatory Visit: Payer: Self-pay

## 2021-07-06 DIAGNOSIS — B451 Cerebral cryptococcosis: Secondary | ICD-10-CM | POA: Diagnosis present

## 2021-07-06 DIAGNOSIS — R7989 Other specified abnormal findings of blood chemistry: Secondary | ICD-10-CM | POA: Diagnosis present

## 2021-07-12 ENCOUNTER — Ambulatory Visit: Payer: Medicare HMO

## 2022-08-20 ENCOUNTER — Ambulatory Visit
Admission: EM | Admit: 2022-08-20 | Discharge: 2022-08-20 | Disposition: A | Discharge status: Home / Self Care | Payer: Medicare HMO | Attending: Family Medicine | Admitting: Family Medicine

## 2022-08-20 DIAGNOSIS — S0990XA Unspecified injury of head, initial encounter: Secondary | ICD-10-CM

## 2022-08-20 DIAGNOSIS — W19XXXA Unspecified fall, initial encounter: Secondary | ICD-10-CM | POA: Diagnosis not present

## 2022-08-20 NOTE — ED Notes (Signed)
Provider called to bedside.

## 2022-08-20 NOTE — Discharge Instructions (Signed)
You have been advised to follow up immediately in the emergency department for concerning signs or symptoms as discussed during your visit. If you declined EMS transport, please have a family member take you directly to the ED at this time. Do not delay.   Based on concerns about condition, if you do not follow up in the ED, you may risk poor outcomes including worsening of condition, delayed treatment and potentially life threatening issues. If you have declined to go to the ED at this time, you should call your PCP immediately to set up a follow up appointment.   Go to ED for red flag symptoms, including; fevers you cannot reduce with Tylenol/Motrin, severe headaches, vision changes, numbness/weakness in part of the body, lethargy, confusion, intractable vomiting, severe dehydration, chest pain, breathing difficulty, severe persistent abdominal or pelvic pain, signs of severe infection (increased redness, swelling of an area), feeling faint or passing out, dizziness, etc. You should especially go to the ED for sudden acute worsening of condition if you do not elect to go at this time.

## 2022-08-20 NOTE — ED Notes (Signed)
Patient is being discharged from the Urgent Care and sent to the Emergency Department via personal vehicle . Per Susa Simmonds, MD, patient is in need of higher level of care due to fall and head trauma. Patient is aware and verbalizes understanding of plan of care.  Vitals:   08/20/22 1452  BP: 115/82  Pulse: 89  Resp: 16  Temp: 99.6 F (37.6 C)  SpO2: 98%
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
## 2022-08-20 NOTE — ED Notes (Signed)
Patient is being discharged from the Urgent Care and sent to the Emergency Department via POV . Per Dr. Susa Simmonds, patient is in need of higher level of care due to fall, altered mental status. Patient is aware and verbalizes understanding of plan of care.  Vitals:   08/20/22 1452  BP: 115/82  Pulse: 89  Resp: 16  Temp: 99.6 F (37.6 C)  SpO2: 98%

## 2022-09-11 ENCOUNTER — Other Ambulatory Visit (HOSPITAL_COMMUNITY): Payer: Self-pay | Admitting: Nurse Practitioner

## 2022-09-11 ENCOUNTER — Other Ambulatory Visit: Payer: Self-pay | Admitting: Nurse Practitioner

## 2022-09-11 DIAGNOSIS — R10826 Epigastric rebound abdominal tenderness: Secondary | ICD-10-CM

## 2022-09-11 DIAGNOSIS — R1011 Right upper quadrant pain: Secondary | ICD-10-CM

## 2022-09-12 ENCOUNTER — Ambulatory Visit
Admission: RE | Admit: 2022-09-12 | Discharge: 2022-09-12 | Disposition: A | Discharge status: Home / Self Care | Payer: Medicare HMO | Source: Ambulatory Visit | Attending: Nurse Practitioner | Admitting: Nurse Practitioner

## 2022-09-12 DIAGNOSIS — R10826 Epigastric rebound abdominal tenderness: Secondary | ICD-10-CM | POA: Insufficient documentation

## 2022-09-12 DIAGNOSIS — R1011 Right upper quadrant pain: Secondary | ICD-10-CM | POA: Insufficient documentation

## 2022-09-13 ENCOUNTER — Ambulatory Visit: Payer: Self-pay | Admitting: Surgery

## 2022-09-13 DIAGNOSIS — K8050 Calculus of bile duct without cholangitis or cholecystitis without obstruction: Secondary | ICD-10-CM

## 2022-09-13 NOTE — H&P (View-Only) (Signed)
Subjective:    CC: Elevated LFTs [R79.89]   HPI:  Duane Santos is a 76 y.o. male who was referred by Sallee Lange, NP for evaluation of above CC. Symptoms were first noted 5 days ago. Pain is sharp and constant, confined to the epigastric area, without radiation.  Associated with nothing, exacerbated by nothing.     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   Past Surgical History:  has a past surgical history that includes Colonoscopy (2008); Colonoscopy (09/11/2018); Biopsy Skin Arm (Left, 10/21/2018); Mohs surgery (10/11/2019); and Cataract extraction (Nov. 2020).   Family History: family history includes Diabetes type II in his father; Emphysema in his maternal grandfather; Other in his father; Stroke in his father.   Social History:  reports that he has quit smoking. His smoking use included cigarettes and cigars. He has never used smokeless tobacco. He reports that he does not drink alcohol and does not use drugs.   Current Medications: has a current medication list which includes the following prescription(s): prolia, herbal supplement, ibuprofen, lactobacillus acidophilus, multivitamin, omega 3-dha-epa-fish oil,  testosterone, vitamin b complex, ergocalciferol (vitamin d2), zinc, and omeprazole.   Allergies:     Allergies as of 09/13/2022   (Not on File)      ROS:  A 15 point review of systems was performed and pertinent positives and negatives noted in HPI    Objective:    BP 106/62   Pulse 58   Ht 172.7 cm (5\' 8" )   Wt 55.8 kg (123 lb)   BMI 18.70 kg/m      Constitutional :  No distress, cooperative, alert  Lymphatics/Throat:  Supple with no lymphadenopathy  Respiratory:  Clear to auscultation bilaterally  Cardiovascular:  Regular rate and rhythm  Gastrointestinal: Soft, focal epigastric TTP  Musculoskeletal: Steady gait and movement  Skin: Cool and moist, no abdominal surgical scars  Psychiatric: Normal affect, non-agitated, not confused         LABS:  -      Lab Results  Component Value Date    WBC 6.5 09/13/2022    HGB 12.7 (L) 09/13/2022    HCT 38.8 (L) 09/13/2022    PLT 206 09/13/2022    -      Lab Results  Component Value Date    NA 138 09/13/2022    K 4.4 09/13/2022    CL 108 09/13/2022    CO2 26.4 09/13/2022    BUN 19 09/13/2022    CREATININE 0.8 09/13/2022    GLUCOSE 94 09/13/2022    -      Lab Results  Component Value Date    AST 127 (H) 09/13/2022    ALT 217 (H) 09/13/2022    ALKPHOS 325 (H) 09/13/2022    TBILI 1.7 (H) 09/13/2022    CONJBILI 0.66 (H) 09/13/2022    ALB 3.9 09/13/2022    TOTALPROTEIN 7.1 09/13/2022      RADS: CLINICAL DATA:  Abdominal pain  EXAM:  ABDOMEN ULTRASOUND COMPLETE   COMPARISON:  Ultrasound abdomen July 06, 2021; chest CT February 17, 2020   FINDINGS:  Gallbladder: Cholelithiasis. Largest stone measures up to 6 mm. No  gallbladder wall thickening or pericholecystic fluid. Negative  sonographic Murphy's sign.   Common bile duct: Diameter: 3.5 mm   Liver: No focal lesion identified. Within normal limits in  parenchymal echogenicity. Portal vein is patent on color Doppler  imaging with normal direction of  blood flow towards the liver.   IVC: No abnormality visualized.   Pancreas: Visualized portion unremarkable.   Spleen: Size and appearance within normal limits.   Right Kidney: Length: 9.2 cm. Echogenicity within normal limits. No  mass or hydronephrosis visualized.   Left Kidney: Length: 10.1 cm. Echogenicity within normal limits. No  mass or hydronephrosis visualized.   Abdominal aorta: No aneurysm visualized.   Other findings: None.   IMPRESSION:  Cholelithiasis without secondary signs of acute cholecystitis.    Electronically Signed    By: Lovey Newcomer M.D.    On: 09/12/2022 15:17    Assessment:       Elevated LFTs [R79.89], now decreasing Cholelithiasis   Likely had CBD stone. Fortunately, seems to have passed based on improving labs without specific intervention.  Recommend proceeding with robotic lap chole to prevent recurrence and resolve residual epigastric pain   Plan:    1. Elevated LFTs [R79.89] Discussed the risk of surgery including post-op infxn, seroma, biloma, chronic pain, poor-delayed wound healing, retained gallstone, conversion to open procedure, post-op SBO or ileus, and need for additional procedures to address said risks.  The risks of general anesthetic including MI, CVA, sudden death or even reaction to anesthetic medications also discussed. Alternatives include continued observation.  Benefits include possible symptom relief, prevention of complications including acute cholecystitis, pancreatitis.   Typical post operative recovery of 3-5 days rest, continued pain in area and incision sites, possible loose stools up to 4-6 weeks, also discussed.   ED return precautions given for sudden increase in RUQ pain, with possible accompanying fever, nausea, and/or vomiting.   The patient understands the risks, any and all questions were answered to the patient's satisfaction.   2. Patient has elected to proceed with surgical treatment. Procedure will be  scheduled.  Written consent was obtained..robotic assisted laparoscopic   labs/images/medications/previous chart entries reviewed personally and relevant changes/updates noted above.

## 2022-09-13 NOTE — H&P (Signed)
Subjective:    CC: Elevated LFTs [R79.89]   HPI:  Duane Santos is a 76 y.o. male who was referred by Sallee Lange, NP for evaluation of above CC. Symptoms were first noted 5 days ago. Pain is sharp and constant, confined to the epigastric area, without radiation.  Associated with nothing, exacerbated by nothing.     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   Past Surgical History:  has a past surgical history that includes Colonoscopy (2008); Colonoscopy (09/11/2018); Biopsy Skin Arm (Left, 10/21/2018); Mohs surgery (10/11/2019); and Cataract extraction (Nov. 2020).   Family History: family history includes Diabetes type II in his father; Emphysema in his maternal grandfather; Other in his father; Stroke in his father.   Social History:  reports that he has quit smoking. His smoking use included cigarettes and cigars. He has never used smokeless tobacco. He reports that he does not drink alcohol and does not use drugs.   Current Medications: has a current medication list which includes the following prescription(s): prolia, herbal supplement, ibuprofen, lactobacillus acidophilus, multivitamin, omega 3-dha-epa-fish oil,  testosterone, vitamin b complex, ergocalciferol (vitamin d2), zinc, and omeprazole.   Allergies:     Allergies as of 09/13/2022   (Not on File)      ROS:  A 15 point review of systems was performed and pertinent positives and negatives noted in HPI    Objective:    BP 106/62   Pulse 58   Ht 172.7 cm (5\' 8" )   Wt 55.8 kg (123 lb)   BMI 18.70 kg/m      Constitutional :  No distress, cooperative, alert  Lymphatics/Throat:  Supple with no lymphadenopathy  Respiratory:  Clear to auscultation bilaterally  Cardiovascular:  Regular rate and rhythm  Gastrointestinal: Soft, focal epigastric TTP  Musculoskeletal: Steady gait and movement  Skin: Cool and moist, no abdominal surgical scars  Psychiatric: Normal affect, non-agitated, not confused         LABS:  -      Lab Results  Component Value Date    WBC 6.5 09/13/2022    HGB 12.7 (L) 09/13/2022    HCT 38.8 (L) 09/13/2022    PLT 206 09/13/2022    -      Lab Results  Component Value Date    NA 138 09/13/2022    K 4.4 09/13/2022    CL 108 09/13/2022    CO2 26.4 09/13/2022    BUN 19 09/13/2022    CREATININE 0.8 09/13/2022    GLUCOSE 94 09/13/2022    -      Lab Results  Component Value Date    AST 127 (H) 09/13/2022    ALT 217 (H) 09/13/2022    ALKPHOS 325 (H) 09/13/2022    TBILI 1.7 (H) 09/13/2022    CONJBILI 0.66 (H) 09/13/2022    ALB 3.9 09/13/2022    TOTALPROTEIN 7.1 09/13/2022      RADS: CLINICAL DATA:  Abdominal pain  EXAM:  ABDOMEN ULTRASOUND COMPLETE   COMPARISON:  Ultrasound abdomen July 06, 2021; chest CT February 17, 2020   FINDINGS:  Gallbladder: Cholelithiasis. Largest stone measures up to 6 mm. No  gallbladder wall thickening or pericholecystic fluid. Negative  sonographic Murphy's sign.   Common bile duct: Diameter: 3.5 mm   Liver: No focal lesion identified. Within normal limits in  parenchymal echogenicity. Portal vein is patent on color Doppler  imaging with normal direction of  blood flow towards the liver.   IVC: No abnormality visualized.   Pancreas: Visualized portion unremarkable.   Spleen: Size and appearance within normal limits.   Right Kidney: Length: 9.2 cm. Echogenicity within normal limits. No  mass or hydronephrosis visualized.   Left Kidney: Length: 10.1 cm. Echogenicity within normal limits. No  mass or hydronephrosis visualized.   Abdominal aorta: No aneurysm visualized.   Other findings: None.   IMPRESSION:  Cholelithiasis without secondary signs of acute cholecystitis.    Electronically Signed    By: Lovey Newcomer M.D.    On: 09/12/2022 15:17    Assessment:       Elevated LFTs [R79.89], now decreasing Cholelithiasis   Likely had CBD stone. Fortunately, seems to have passed based on improving labs without specific intervention.  Recommend proceeding with robotic lap chole to prevent recurrence and resolve residual epigastric pain   Plan:    1. Elevated LFTs [R79.89] Discussed the risk of surgery including post-op infxn, seroma, biloma, chronic pain, poor-delayed wound healing, retained gallstone, conversion to open procedure, post-op SBO or ileus, and need for additional procedures to address said risks.  The risks of general anesthetic including MI, CVA, sudden death or even reaction to anesthetic medications also discussed. Alternatives include continued observation.  Benefits include possible symptom relief, prevention of complications including acute cholecystitis, pancreatitis.   Typical post operative recovery of 3-5 days rest, continued pain in area and incision sites, possible loose stools up to 4-6 weeks, also discussed.   ED return precautions given for sudden increase in RUQ pain, with possible accompanying fever, nausea, and/or vomiting.   The patient understands the risks, any and all questions were answered to the patient's satisfaction.   2. Patient has elected to proceed with surgical treatment. Procedure will be  scheduled.  Written consent was obtained..robotic assisted laparoscopic   labs/images/medications/previous chart entries reviewed personally and relevant changes/updates noted above.

## 2022-09-23 ENCOUNTER — Encounter
Admission: RE | Admit: 2022-09-23 | Discharge: 2022-09-23 | Disposition: A | Discharge status: Home / Self Care | Payer: Medicare HMO | Source: Ambulatory Visit | Attending: Surgery | Admitting: Surgery

## 2022-09-23 DIAGNOSIS — J9611 Chronic respiratory failure with hypoxia: Secondary | ICD-10-CM

## 2022-09-23 DIAGNOSIS — Z01818 Encounter for other preprocedural examination: Secondary | ICD-10-CM

## 2022-09-23 HISTORY — DX: Pressure ulcer of sacral region, stage 1: L89.151

## 2022-09-23 HISTORY — DX: Unspecified osteoarthritis, unspecified site: M19.90

## 2022-09-23 HISTORY — DX: Chronic respiratory failure with hypoxia: J96.11

## 2022-09-23 HISTORY — DX: Nutritional anemia, unspecified: D53.9

## 2022-09-23 HISTORY — DX: Age-related osteoporosis without current pathological fracture: M81.0

## 2022-09-23 HISTORY — DX: Hypo-osmolality and hyponatremia: E87.1

## 2022-09-23 HISTORY — DX: Unspecified severe protein-calorie malnutrition: E43

## 2022-09-23 HISTORY — DX: Atherosclerosis of aorta: I70.0

## 2022-09-23 NOTE — Patient Instructions (Signed)
Your procedure is scheduled on:09-24-22 Tuesday Report to the Registration Desk on the 1st floor of the [facility].Then proceed to the 2nd floor Surgery Desk To find out your arrival time, please call [phone] between 1PM - 3PM on:09-23-22 Monday If your arrival time is 6:00 am, do not arrive prior to that time as the [facility] entrance doors do not open until 6:00 am.  REMEMBER: Instructions that are not followed completely may result in serious medical risk, up to and including death; or upon the discretion of your surgeon and anesthesiologist your surgery may need to be rescheduled.  Do not eat food after midnight the night before surgery.  No gum chewing, lozengers or hard candies.  You may however, drink CLEAR liquids up to 2 hours before you are scheduled to arrive for your surgery. Do not drink anything within 2 hours of your scheduled arrival time.  Clear liquids include: - water  - apple juice without pulp - gatorade (not RED colors) - black coffee or tea (Do NOT add milk or creamers to the coffee or tea) Do NOT drink anything that is not on this list.  Do NOT take any medication the day of surgery  One week prior to surgery: Stop Anti-inflammatories (NSAIDS) such as Advil, Aleve, Ibuprofen, Motrin, Naproxen, Naprosyn and Aspirin based products such as Excedrin, Goodys Powder, BC Powder You may however, continue to take Tylenol if needed for pain up until the day of surgery.  No Alcohol for 24 hours before or after surgery.  No Smoking including e-cigarettes for 24 hours prior to surgery.  No chewable tobacco products for at least 6 hours prior to surgery.  No nicotine patches on the day of surgery.  Do not use any "recreational" drugs for at least a week prior to your surgery.  Please be advised that the combination of cocaine and anesthesia may have negative outcomes, up to and including death. If you test positive for cocaine, your surgery will be  cancelled.  On the morning of surgery brush your teeth with toothpaste and water, you may rinse your mouth with mouthwash if you wish. Do not swallow any toothpaste or mouthwash.  Do not wear jewelry, make-up, hairpins, clips or nail polish.  Do not wear lotions, powders, or perfumes.   Do not shave body from the neck down 48 hours prior to surgery just in case you cut yourself which could leave a site for infection.  Also, freshly shaved skin may become irritated if using the CHG soap.  Contact lenses, hearing aids and dentures may not be worn into surgery.  Do not bring valuables to the hospital. [facility] is not responsible for any missing/lost belongings or valuables.   Notify your doctor if there is any change in your medical condition (cold, fever, infection).  Wear comfortable clothing (specific to your surgery type) to the hospital.  After surgery, you can help prevent lung complications by doing breathing exercises.  Take deep breaths and cough every 1-2 hours. Your doctor may order a device called an Incentive Spirometer to help you take deep breaths. When coughing or sneezing, hold a pillow firmly against your incision with both hands. This is called "splinting." Doing this helps protect your incision. It also decreases belly discomfort.  If you are being admitted to the hospital overnight, leave your suitcase in the car. After surgery it may be brought to your room.  If you are being discharged the day of surgery, you will not be  allowed to drive home. You will need a responsible adult (18 years or older) to drive you home and stay with you that night.   If you are taking public transportation, you will need to have a responsible adult (18 years or older) with you. Please confirm with your physician that it is acceptable to use public transportation.   Please call the [facility] at [phone] if you have any questions about these  instructions.  Surgery Visitation Policy:  Patients undergoing a surgery or procedure may have two family members or support persons with them as long as the person is not COVID-19 positive or experiencing its symptoms.

## 2022-09-24 ENCOUNTER — Other Ambulatory Visit: Payer: Self-pay

## 2022-09-24 ENCOUNTER — Encounter: Payer: Self-pay | Admitting: Surgery

## 2022-09-24 ENCOUNTER — Ambulatory Visit: Payer: Medicare HMO | Admitting: Anesthesiology

## 2022-09-24 ENCOUNTER — Encounter
Admission: RE | Disposition: A | Discharge status: Home / Self Care | Payer: Self-pay | Source: Home / Self Care | Attending: Surgery

## 2022-09-24 ENCOUNTER — Ambulatory Visit
Admission: RE | Admit: 2022-09-24 | Discharge: 2022-09-24 | Disposition: A | Discharge status: Home / Self Care | Payer: Medicare HMO | Attending: Surgery | Admitting: Surgery

## 2022-09-24 DIAGNOSIS — K8010 Calculus of gallbladder with chronic cholecystitis without obstruction: Secondary | ICD-10-CM | POA: Diagnosis not present

## 2022-09-24 DIAGNOSIS — Z87891 Personal history of nicotine dependence: Secondary | ICD-10-CM | POA: Insufficient documentation

## 2022-09-24 DIAGNOSIS — Z8616 Personal history of COVID-19: Secondary | ICD-10-CM | POA: Insufficient documentation

## 2022-09-24 DIAGNOSIS — R7989 Other specified abnormal findings of blood chemistry: Secondary | ICD-10-CM | POA: Diagnosis not present

## 2022-09-24 DIAGNOSIS — K811 Chronic cholecystitis: Secondary | ICD-10-CM

## 2022-09-24 DIAGNOSIS — J9611 Chronic respiratory failure with hypoxia: Secondary | ICD-10-CM

## 2022-09-24 DIAGNOSIS — Z01818 Encounter for other preprocedural examination: Secondary | ICD-10-CM

## 2022-09-24 DIAGNOSIS — K8050 Calculus of bile duct without cholangitis or cholecystitis without obstruction: Secondary | ICD-10-CM

## 2022-09-24 DIAGNOSIS — Z86711 Personal history of pulmonary embolism: Secondary | ICD-10-CM | POA: Insufficient documentation

## 2022-09-24 LAB — HEPATIC FUNCTION PANEL
ALT: 51 U/L — ABNORMAL HIGH (ref 0–44)
AST: 42 U/L — ABNORMAL HIGH (ref 15–41)
Albumin: 4 g/dL (ref 3.5–5.0)
Alkaline Phosphatase: 173 U/L — ABNORMAL HIGH (ref 38–126)
Bilirubin, Direct: 0.3 mg/dL — ABNORMAL HIGH (ref 0.0–0.2)
Indirect Bilirubin: 0.5 mg/dL (ref 0.3–0.9)
Total Bilirubin: 0.8 mg/dL (ref 0.3–1.2)
Total Protein: 7.5 g/dL (ref 6.5–8.1)

## 2022-09-24 SURGERY — CHOLECYSTECTOMY, ROBOT-ASSISTED, LAPAROSCOPIC
Anesthesia: General | Site: Abdomen

## 2022-09-24 MED ORDER — CHLORHEXIDINE GLUCONATE CLOTH 2 % EX PADS
6.0000 | MEDICATED_PAD | Freq: Once | CUTANEOUS | Status: AC
  Administered 2022-09-24: 6 via TOPICAL

## 2022-09-24 MED ORDER — GLYCOPYRROLATE 0.2 MG/ML IJ SOLN
INTRAMUSCULAR | Status: DC | PRN
  Administered 2022-09-24: .2 mg via INTRAVENOUS

## 2022-09-24 MED ORDER — CHLORHEXIDINE GLUCONATE 0.12 % MT SOLN: 15.0000 mL | Freq: Once | OROMUCOSAL | Status: AC

## 2022-09-24 MED ORDER — LIDOCAINE HCL (CARDIAC) PF 100 MG/5ML IV SOSY
PREFILLED_SYRINGE | INTRAVENOUS | Status: DC | PRN
  Administered 2022-09-24: 100 mg via INTRAVENOUS

## 2022-09-24 MED ORDER — LIDOCAINE-EPINEPHRINE (PF) 1 %-1:200000 IJ SOLN
INTRAMUSCULAR | Status: DC | PRN
  Administered 2022-09-24: 20 mL via INTRAMUSCULAR

## 2022-09-24 MED ORDER — PHENYLEPHRINE HCL-NACL 20-0.9 MG/250ML-% IV SOLN
INTRAVENOUS | Status: AC
  Filled 2022-09-24: qty 250

## 2022-09-24 MED ORDER — FENTANYL CITRATE (PF) 100 MCG/2ML IJ SOLN
INTRAMUSCULAR | Status: DC | PRN
  Administered 2022-09-24 (×3): 50 ug via INTRAVENOUS

## 2022-09-24 MED ORDER — ESMOLOL HCL 100 MG/10ML IV SOLN
INTRAVENOUS | Status: DC | PRN
  Administered 2022-09-24: 10 mg via INTRAVENOUS

## 2022-09-24 MED ORDER — ACETAMINOPHEN 325 MG PO TABS: 650.0000 mg | ORAL_TABLET | Freq: Three times a day (TID) | ORAL | 0 refills | Status: AC | PRN

## 2022-09-24 MED ORDER — PHENYLEPHRINE HCL-NACL 20-0.9 MG/250ML-% IV SOLN
INTRAVENOUS | Status: DC | PRN
  Administered 2022-09-24: 50 ug/min via INTRAVENOUS

## 2022-09-24 MED ORDER — FENTANYL CITRATE (PF) 100 MCG/2ML IJ SOLN
INTRAMUSCULAR | Status: AC
  Filled 2022-09-24: qty 2

## 2022-09-24 MED ORDER — FAMOTIDINE 20 MG PO TABS
ORAL_TABLET | ORAL | Status: AC
  Administered 2022-09-24: 20 mg via ORAL
  Filled 2022-09-24: qty 1

## 2022-09-24 MED ORDER — CEFAZOLIN SODIUM-DEXTROSE 2-4 GM/100ML-% IV SOLN
INTRAVENOUS | Status: AC
  Filled 2022-09-24: qty 100

## 2022-09-24 MED ORDER — DROPERIDOL 2.5 MG/ML IJ SOLN: 0.6250 mg | Freq: Once | INTRAMUSCULAR | Status: DC | PRN

## 2022-09-24 MED ORDER — DOCUSATE SODIUM 100 MG PO CAPS: 100.0000 mg | ORAL_CAPSULE | Freq: Two times a day (BID) | ORAL | 0 refills | Status: AC | PRN

## 2022-09-24 MED ORDER — DEXMEDETOMIDINE HCL IN NACL 80 MCG/20ML IV SOLN
INTRAVENOUS | Status: DC | PRN
  Administered 2022-09-24: 12 ug via BUCCAL
  Administered 2022-09-24: 8 ug via BUCCAL

## 2022-09-24 MED ORDER — OXYCODONE HCL 5 MG PO TABS: 5.0000 mg | ORAL_TABLET | Freq: Three times a day (TID) | ORAL | 0 refills | Status: AC | PRN

## 2022-09-24 MED ORDER — ACETAMINOPHEN 10 MG/ML IV SOLN: 1000.0000 mg | Freq: Once | INTRAVENOUS | Status: DC | PRN

## 2022-09-24 MED ORDER — PROMETHAZINE HCL 25 MG/ML IJ SOLN: 6.2500 mg | INTRAMUSCULAR | Status: DC | PRN

## 2022-09-24 MED ORDER — ORAL CARE MOUTH RINSE: 15.0000 mL | Freq: Once | OROMUCOSAL | Status: AC

## 2022-09-24 MED ORDER — INDOCYANINE GREEN 25 MG IV SOLR
1.2500 mg | Freq: Once | INTRAVENOUS | Status: AC
  Administered 2022-09-24: 1.25 mg via INTRAVENOUS
  Filled 2022-09-24: qty 0.5

## 2022-09-24 MED ORDER — BUPIVACAINE HCL (PF) 0.5 % IJ SOLN
INTRAMUSCULAR | Status: AC
  Filled 2022-09-24: qty 30

## 2022-09-24 MED ORDER — LACTATED RINGERS IV SOLN: INTRAVENOUS | Status: DC

## 2022-09-24 MED ORDER — CHLORHEXIDINE GLUCONATE 0.12 % MT SOLN
OROMUCOSAL | Status: AC
  Administered 2022-09-24: 15 mL via OROMUCOSAL
  Filled 2022-09-24: qty 15

## 2022-09-24 MED ORDER — 0.9 % SODIUM CHLORIDE (POUR BTL) OPTIME
TOPICAL | Status: DC | PRN
  Administered 2022-09-24: 500 mL

## 2022-09-24 MED ORDER — IBUPROFEN 800 MG PO TABS: 800.0000 mg | ORAL_TABLET | Freq: Three times a day (TID) | ORAL | 0 refills | Status: AC | PRN

## 2022-09-24 MED ORDER — CEFAZOLIN SODIUM-DEXTROSE 2-4 GM/100ML-% IV SOLN
2.0000 g | INTRAVENOUS | Status: AC
  Administered 2022-09-24: 2 g via INTRAVENOUS

## 2022-09-24 MED ORDER — SUGAMMADEX SODIUM 200 MG/2ML IV SOLN
INTRAVENOUS | Status: DC | PRN
  Administered 2022-09-24: 200 mg via INTRAVENOUS

## 2022-09-24 MED ORDER — ROCURONIUM BROMIDE 100 MG/10ML IV SOLN
INTRAVENOUS | Status: DC | PRN
  Administered 2022-09-24: 20 mg via INTRAVENOUS
  Administered 2022-09-24: 10 mg via INTRAVENOUS
  Administered 2022-09-24: 15 mg via INTRAVENOUS

## 2022-09-24 MED ORDER — SODIUM CHLORIDE 0.9 % IR SOLN
Status: DC | PRN
  Administered 2022-09-24: 100 mL

## 2022-09-24 MED ORDER — KETOROLAC TROMETHAMINE 30 MG/ML IJ SOLN
INTRAMUSCULAR | Status: DC | PRN
  Administered 2022-09-24: 15 mg via INTRAVENOUS

## 2022-09-24 MED ORDER — OXYCODONE HCL 5 MG/5ML PO SOLN: 5.0000 mg | Freq: Once | ORAL | Status: DC | PRN

## 2022-09-24 MED ORDER — FENTANYL CITRATE (PF) 100 MCG/2ML IJ SOLN: 25.0000 ug | INTRAMUSCULAR | Status: DC | PRN

## 2022-09-24 MED ORDER — LIDOCAINE-EPINEPHRINE (PF) 1 %-1:200000 IJ SOLN
INTRAMUSCULAR | Status: AC
  Filled 2022-09-24: qty 30

## 2022-09-24 MED ORDER — PROPOFOL 10 MG/ML IV BOLUS
INTRAVENOUS | Status: DC | PRN
  Administered 2022-09-24: 30 mg via INTRAVENOUS
  Administered 2022-09-24: 20 mg via INTRAVENOUS
  Administered 2022-09-24: 120 mg via INTRAVENOUS

## 2022-09-24 MED ORDER — OXYCODONE HCL 5 MG PO TABS: 5.0000 mg | ORAL_TABLET | Freq: Once | ORAL | Status: DC | PRN

## 2022-09-24 MED ORDER — FAMOTIDINE 20 MG PO TABS: 20.0000 mg | ORAL_TABLET | Freq: Once | ORAL | Status: AC

## 2022-09-24 SURGICAL SUPPLY — 61 items
ADH SKN CLS APL DERMABOND .7 (GAUZE/BANDAGES/DRESSINGS) ×2
ANCHOR TIS RET SYS 235ML (MISCELLANEOUS) ×2 IMPLANT
BAG PRESSURE INF REUSE 1000 (BAG) IMPLANT
BAG TISS RTRVL C235 10X14 (MISCELLANEOUS) ×2
BLADE SURG SZ11 CARB STEEL (BLADE) ×2 IMPLANT
CANNULA REDUC XI 12-8 STAPL (CANNULA) ×2
CANNULA REDUCER 12-8 DVNC XI (CANNULA) ×2 IMPLANT
CATH REDDICK CHOLANGI 4FR 50CM (CATHETERS) IMPLANT
CLIP LIGATING HEMO O LOK GREEN (MISCELLANEOUS) ×2 IMPLANT
DERMABOND ADVANCED .7 DNX12 (GAUZE/BANDAGES/DRESSINGS) ×2 IMPLANT
DRAPE ARM DVNC X/XI (DISPOSABLE) ×8 IMPLANT
DRAPE C-ARM XRAY 36X54 (DRAPES) IMPLANT
DRAPE COLUMN DVNC XI (DISPOSABLE) ×2 IMPLANT
DRAPE DA VINCI XI ARM (DISPOSABLE) ×8
DRAPE DA VINCI XI COLUMN (DISPOSABLE) ×2
ELECT CAUTERY BLADE 6.4 (BLADE) ×2 IMPLANT
ELECT REM PT RETURN 9FT ADLT (ELECTROSURGICAL) ×2
ELECTRODE REM PT RTRN 9FT ADLT (ELECTROSURGICAL) ×2 IMPLANT
GLOVE BIOGEL PI IND STRL 7.0 (GLOVE) ×4 IMPLANT
GLOVE SURG SYN 6.5 ES PF (GLOVE) ×4 IMPLANT
GLOVE SURG SYN 6.5 PF PI (GLOVE) ×4 IMPLANT
GOWN STRL REUS W/ TWL LRG LVL3 (GOWN DISPOSABLE) ×6 IMPLANT
GOWN STRL REUS W/TWL LRG LVL3 (GOWN DISPOSABLE) ×6
GRASPER SUT 15 45D LOW PRO (SUTURE) IMPLANT
GRASPER SUT TROCAR 14GX15 (MISCELLANEOUS) IMPLANT
IRRIGATOR SUCT 8 DISP DVNC XI (IRRIGATION / IRRIGATOR) IMPLANT
IRRIGATOR SUCTION 8MM XI DISP (IRRIGATION / IRRIGATOR) ×2
IV NS 1000ML (IV SOLUTION) ×2
IV NS 1000ML BAXH (IV SOLUTION) IMPLANT
LABEL OR SOLS (LABEL) ×2 IMPLANT
MANIFOLD NEPTUNE II (INSTRUMENTS) ×2 IMPLANT
NDL INSUFFLATION 14GA 120MM (NEEDLE) ×2 IMPLANT
NEEDLE HYPO 22GX1.5 SAFETY (NEEDLE) ×2 IMPLANT
NEEDLE INSUFFLATION 14GA 120MM (NEEDLE) ×2 IMPLANT
NS IRRIG 500ML POUR BTL (IV SOLUTION) ×2 IMPLANT
OBTURATOR OPTICAL STANDARD 8MM (TROCAR) ×2
OBTURATOR OPTICAL STND 8 DVNC (TROCAR) ×2
OBTURATOR OPTICALSTD 8 DVNC (TROCAR) ×2 IMPLANT
PACK LAP CHOLECYSTECTOMY (MISCELLANEOUS) ×2 IMPLANT
PENCIL SMOKE EVACUATOR (MISCELLANEOUS) ×2 IMPLANT
RELOAD STAPLE 45 3.5 BLU DVNC (STAPLE) IMPLANT
RELOAD STAPLER 3.5X45 BLU DVNC (STAPLE) ×4 IMPLANT
SEAL CANN UNIV 5-8 DVNC XI (MISCELLANEOUS) ×6 IMPLANT
SEAL XI 5MM-8MM UNIVERSAL (MISCELLANEOUS) ×6
SET TUBE SMOKE EVAC HIGH FLOW (TUBING) ×2 IMPLANT
SOLUTION ELECTROLUBE (MISCELLANEOUS) ×2 IMPLANT
SPIKE FLUID TRANSFER (MISCELLANEOUS) ×4 IMPLANT
STAPLER 45 DA VINCI SURE FORM (STAPLE) ×2
STAPLER 45 SUREFORM DVNC (STAPLE) IMPLANT
STAPLER CANNULA SEAL DVNC XI (STAPLE) ×2 IMPLANT
STAPLER CANNULA SEAL XI (STAPLE) ×2
STAPLER RELOAD 3.5X45 BLU DVNC (STAPLE) ×4
STAPLER RELOAD 3.5X45 BLUE (STAPLE) ×4
SUT MNCRL 4-0 (SUTURE) ×6
SUT MNCRL 4-0 27XMFL (SUTURE) ×6
SUT VICRYL 0 AB UR-6 (SUTURE) ×2 IMPLANT
SUTURE MNCRL 4-0 27XMF (SUTURE) ×4 IMPLANT
SYR 30ML LL (SYRINGE) IMPLANT
SYSTEM WECK SHIELD CLOSURE (TROCAR) IMPLANT
TRAP FLUID SMOKE EVACUATOR (MISCELLANEOUS) ×2 IMPLANT
WATER STERILE IRR 500ML POUR (IV SOLUTION) ×2 IMPLANT

## 2022-09-24 NOTE — Anesthesia Postprocedure Evaluation (Signed)
Anesthesia Post Note  Patient: JABREE REBERT  Procedure(s) Performed: XI ROBOTIC ASSISTED LAPAROSCOPIC CHOLECYSTECTOMY (Abdomen) INDOCYANINE GREEN FLUORESCENCE IMAGING (ICG)  Patient location during evaluation: PACU Anesthesia Type: General Level of consciousness: awake and alert Pain management: pain level controlled Vital Signs Assessment: post-procedure vital signs reviewed and stable Respiratory status: spontaneous breathing, nonlabored ventilation and respiratory function stable Cardiovascular status: blood pressure returned to baseline and stable Postop Assessment: no apparent nausea or vomiting Anesthetic complications: no   No notable events documented.   Last Vitals:  Vitals:   09/24/22 1355 09/24/22 1400  BP:  128/61  Pulse: (!) 51 (!) 55  Resp: 13 13  Temp:    SpO2: 100% 100%    Last Pain:  Vitals:   09/24/22 1400  TempSrc:   PainSc: 0-No pain                 Iran Ouch

## 2022-09-24 NOTE — Anesthesia Procedure Notes (Signed)
Procedure Name: Intubation Date/Time: 09/24/2022 11:12 AM  Performed by: Beverely Low, CRNAPre-anesthesia Checklist: Patient identified, Patient being monitored, Timeout performed, Emergency Drugs available and Suction available Patient Re-evaluated:Patient Re-evaluated prior to induction Oxygen Delivery Method: Circle system utilized Preoxygenation: Pre-oxygenation with 100% oxygen Induction Type: IV induction Ventilation: Mask ventilation without difficulty Laryngoscope Size: Mac and 3 Grade View: Grade I Tube type: Oral Tube size: 7.5 mm Number of attempts: 1 Airway Equipment and Method: Stylet Placement Confirmation: ETT inserted through vocal cords under direct vision, positive ETCO2 and breath sounds checked- equal and bilateral Secured at: 21 cm Tube secured with: Tape Dental Injury: Teeth and Oropharynx as per pre-operative assessment

## 2022-09-24 NOTE — Interval H&P Note (Signed)
History and Physical Interval Note:  09/24/2022 9:31 AM  Duane Santos  has presented today for surgery, with the diagnosis of Elevated LFTs L41.03 Biliary colic U13.14.  The various methods of treatment have been discussed with the patient and family. After consideration of risks, benefits and other options for treatment, the patient has consented to  Procedure(s): XI ROBOTIC ASSISTED LAPAROSCOPIC CHOLECYSTECTOMY (N/A) [place] (ICG) (N/A) as a surgical intervention.  The patient's history has been reviewed, patient examined, no change in status, stable for surgery.  I have reviewed the patient's chart and labs.  Questions were answered to the patient's satisfaction.     Floyce Bujak Lysle Pearl

## 2022-09-24 NOTE — Discharge Instructions (Addendum)
AMBULATORY SURGERY  DISCHARGE INSTRUCTIONS   The drugs that you were given will stay in your system until tomorrow so for the next 24 hours you should not:  Drive an automobile Make any legal decisions Drink any alcoholic beverage   You may resume regular meals tomorrow.  Today it is better to start with liquids and gradually work up to solid foods.  You may eat anything you prefer, but it is better to start with liquids, then soup and crackers, and gradually work up to solid foods.   Please notify your doctor immediately if you have any unusual bleeding, trouble breathing, redness and pain at the surgery site, drainage, fever, or pain not relieved by medication.    Additional Instructions:        Please contact your physician with any problems or Same Day Surgery at [phone], Monday through Friday 6 am to 4 pm, or [place] at [facility] number at [phone].   Laparoscopic Cholecystectomy, Care After This sheet gives you information about how to care for yourself after your procedure. Your doctor may also give you more specific instructions. If you have problems or questions, contact your doctor. Follow these instructions at home: Care for cuts from surgery (incisions)  Follow instructions from your doctor about how to take care of your cuts from surgery. Make sure you: Wash your hands with soap and water before you change your bandage (dressing). If you cannot use soap and water, use hand sanitizer. Change your bandage as told by your doctor. Leave stitches (sutures), skin glue, or skin tape (adhesive) strips in place. They may need to stay in place for 2 weeks or longer. If tape strips get loose and curl up, you may trim the loose edges. Do not remove tape strips completely unless your doctor says it is okay. Do not take baths, swim, or use a hot tub until your doctor says it is okay. OK TO SHOWER 24HRS AFTER YOUR SURGERY.  Check your surgical cut area every day  for signs of infection. Check for: More redness, swelling, or pain. More fluid or blood. Warmth. Pus or a bad smell. Activity Do not drive or use heavy machinery while taking prescription pain medicine. Do not play contact sports until your doctor says it is okay. Do not drive for 24 hours if you were given a medicine to help you relax (sedative). Rest as needed. Do not return to work or school until your doctor says it is okay. General instructions  tylenol and advil as needed for discomfort.  Please alternate between the two every four hours as needed for pain.    Use narcotics, if prescribed, only when tylenol and motrin is not enough to control pain.  325-650mg  every 8hrs to max of 3000mg /24hrs (including the 325mg  in every norco dose) for the tylenol.    Advil up to 800mg  per dose every 8hrs as needed for pain.   To prevent or treat constipation while you are taking prescription pain medicine, your doctor may recommend that you: Drink enough fluid to keep your pee (urine) clear or pale yellow. Take over-the-counter or prescription medicines. Eat foods that are high in fiber, such as fresh fruits and vegetables, whole grains, and beans. Limit foods that are high in fat and processed sugars, such as fried and sweet foods. Contact a doctor if: You develop a rash. You have more redness, swelling, or pain around your surgical cuts. You have more fluid or blood coming from your surgical cuts.  Your surgical cuts feel warm to the touch. You have pus or a bad smell coming from your surgical cuts. You have a fever. One or more of your surgical cuts breaks open. You have trouble breathing. You have chest pain. You have pain that is getting worse in your shoulders. You faint or feel dizzy when you stand. You have very bad pain in your belly (abdomen). You are sick to your stomach (nauseous) for more than one day. You have throwing up (vomiting) that lasts for more than one day. You have  leg pain. This information is not intended to replace advice given to you by your health care provider. Make sure you discuss any questions you have with your health care provider. Document Released: 09/03/2008 Document Revised: 06/15/2016 Document Reviewed: 05/13/2016 Elsevier Interactive Patient Education  2019 [employer].

## 2022-09-24 NOTE — Transfer of Care (Addendum)
Immediate Anesthesia Transfer of Care Note  Patient: Duane Santos  Procedure(s) Performed: XI ROBOTIC ASSISTED LAPAROSCOPIC CHOLECYSTECTOMY (Abdomen) INDOCYANINE GREEN FLUORESCENCE IMAGING (ICG)  Patient Location: PACU  Anesthesia Type:General  Level of Consciousness: drowsy  Airway & Oxygen Therapy: Patient Spontanous Breathing and Patient connected to face mask oxygen  Post-op Assessment: Report given to RN and Post -op Vital signs reviewed and stable  Post vital signs: Reviewed and stable  Last Vitals:  Vitals Value Taken Time  BP    Temp    Pulse    Resp    SpO2      Last Pain:  Vitals:   09/24/22 0903  TempSrc: Temporal  PainSc: 0-No pain         Complications: No notable events documented.
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
## 2022-09-24 NOTE — Op Note (Signed)
Preoperative diagnosis:  chronic and cholecystitis  Postoperative diagnosis: same as above  Procedure: Robotic assisted Laparoscopic Cholecystectomy.   Anesthesia: GETA   Surgeon: Benjamine Sprague  Specimen: Gallbladder  Complications: None  EBL: 74mL  Wound Classification: Clean Contaminated  Indications: see HPI  Findings: Gallbladder separated from the liver bed attached by 1 stalk.  Extensive scarring at this point made it difficult to separate cystic artery and duct.  Due to the overall short stalk and the proximity of the visualized common bile duct via ICG, decision made to proceed with a stapled resection of the gallbladder at this point.    Severely diseased liver Adequate hemostasis  Description of procedure:  The patient was placed on the operating table in the supine position. SCDs placed, pre-op abx administered.  General anesthesia was induced and OG tube placed by anesthesia. A time-out was completed verifying correct patient, procedure, site, positioning, and implant(s) and/or special equipment prior to beginning this procedure. The abdomen was prepped and draped in the usual sterile fashion.    Veress needle was placed at the Palmer's point and insufflation was started after confirming a positive saline drop test and no immediate increase in abdominal pressure.  After reaching 15 mm, the Veress needle was removed and a 8 mm port was placed via optiview technique under umbilicus measured 30ZS from gallbladder.  The abdomen was inspected and no abnormalities or injuries were found.  Under direct vision, ports were placed in the following locations: One 12 mm patient left of the umbilicus, 8cm from the optiviewed port, one 8 mm port placed to the patient right of the umbilical port 8 cm apart.  1 additional 8 mm port placed lateral to the 87mm port.  Once ports were placed, The table was placed in the reverse Trendelenburg position with the right side up. The Xi platform was  brought into the operative field and docked to the ports successfully.  An endoscope was placed through the umbilical port, fenestrated grasper through the adjacent patient right port, prograsp to the far patient left port, and then a hook cautery in the left port.  The dome of the gallbladder was grasped with prograsp, passed and retracted over the dome of the liver. Adhesions between the gallbladder and omentum, duodenum and transverse colon were lysed via hook cautery. The infundibulum was grasped with the fenestrated grasper and retracted toward the right lower quadrant. This maneuver exposed Calot's triangle. The peritoneum overlying the gallbladder infundibulum was then dissected  and the cystic duct and cystic artery tried to be identified.  This proved unsuccessful due to the extensive scarring around the area.  Dome down approach was next attempted to isolate the area.  Gallbladder eventually was completely separated from the gallbladder fossa attached by 1 stalk.  Extensive scarring at this point made it difficult to separate cystic artery and duct.  Due to the overall short stalk and the proximity of the visualized common bile duct via ICG, decision made to proceed with a stapled resection of the gallbladder at this point.  91mm endoGIA stapler, blue load used to transect stalk.  Bleeding at staple line controlled with cautery.   Hemostasis was checked again prior to removing the hook cautery and the Endo Catch bag was then placed through the 12 mm port and the gallbladder was removed.  The gallbladder was passed off the table as a specimen. There was no evidence of bleeding from the gallbladder fossa. No leakage of the bile or bleeding from staple  line. The 12 mm port site closed with PMI using 0 vicryl under direct vision.  Abdomen desufflated and secondary trocars were removed under direct vision. No bleeding was noted. All skin incisions then closed with subcuticular sutures of 4-0 monocryl and  dressed with topical skin adhesive. The orogastric tube was removed and patient extubated.  The patient tolerated the procedure well and was taken to the postanesthesia care unit in stable condition.  All sponge and instrument count correct at end of procedure.

## 2022-09-25 LAB — SURGICAL PATHOLOGY

## 2024-07-03 ENCOUNTER — Ambulatory Visit
Admission: EM | Admit: 2024-07-03 | Discharge: 2024-07-03 | Disposition: A | Discharge status: Home / Self Care | Attending: Family Medicine | Admitting: Family Medicine

## 2024-07-03 DIAGNOSIS — Z8669 Personal history of other diseases of the nervous system and sense organs: Secondary | ICD-10-CM | POA: Diagnosis not present

## 2024-07-03 DIAGNOSIS — R21 Rash and other nonspecific skin eruption: Secondary | ICD-10-CM

## 2024-07-03 MED ORDER — MUPIROCIN 2 % EX OINT: 1.0000 | TOPICAL_OINTMENT | Freq: Three times a day (TID) | CUTANEOUS | 0 refills | Status: AC

## 2024-07-03 MED ORDER — CEPHALEXIN 500 MG PO CAPS: 500.0000 mg | ORAL_CAPSULE | Freq: Three times a day (TID) | ORAL | 0 refills | Status: AC
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
## 2024-07-03 NOTE — ED Triage Notes (Signed)
 Patient states that he has a blister that came up on the left side of his head that seemed to have popped and left a sore. Painful to the touch. Unsure of where it came from.

## 2024-07-03 NOTE — Discharge Instructions (Signed)
 Stop by the pharmacy to pick up your prescriptions.  Follow up with your primary care provider or return to the urgent care, if not improving.
